# Patient Record
Sex: Female | Born: 1945
Health system: Southern US, Community
[De-identification: ages and names within clinical notes are randomized; demographics above are authoritative.]

## PROBLEM LIST (undated history)

## (undated) DIAGNOSIS — J45909 Unspecified asthma, uncomplicated: Secondary | ICD-10-CM

## (undated) DIAGNOSIS — Z9289 Personal history of other medical treatment: Secondary | ICD-10-CM

## (undated) DIAGNOSIS — K5792 Diverticulitis of intestine, part unspecified, without perforation or abscess without bleeding: Secondary | ICD-10-CM

## (undated) DIAGNOSIS — K219 Gastro-esophageal reflux disease without esophagitis: Secondary | ICD-10-CM

## (undated) DIAGNOSIS — E785 Hyperlipidemia, unspecified: Secondary | ICD-10-CM

## (undated) DIAGNOSIS — K760 Fatty (change of) liver, not elsewhere classified: Secondary | ICD-10-CM

## (undated) DIAGNOSIS — I10 Essential (primary) hypertension: Secondary | ICD-10-CM

## (undated) DIAGNOSIS — Z8542 Personal history of malignant neoplasm of other parts of uterus: Secondary | ICD-10-CM

## (undated) DIAGNOSIS — G8929 Other chronic pain: Secondary | ICD-10-CM

## (undated) DIAGNOSIS — K529 Noninfective gastroenteritis and colitis, unspecified: Secondary | ICD-10-CM

## (undated) DIAGNOSIS — M1990 Unspecified osteoarthritis, unspecified site: Secondary | ICD-10-CM

## (undated) DIAGNOSIS — Z5189 Encounter for other specified aftercare: Secondary | ICD-10-CM

## (undated) DIAGNOSIS — D649 Anemia, unspecified: Secondary | ICD-10-CM

## (undated) DIAGNOSIS — T7840XA Allergy, unspecified, initial encounter: Secondary | ICD-10-CM

## (undated) DIAGNOSIS — E278 Other specified disorders of adrenal gland: Secondary | ICD-10-CM

## (undated) DIAGNOSIS — R200 Anesthesia of skin: Secondary | ICD-10-CM

## (undated) DIAGNOSIS — K5790 Diverticulosis of intestine, part unspecified, without perforation or abscess without bleeding: Secondary | ICD-10-CM

## (undated) DIAGNOSIS — N8110 Cystocele, unspecified: Secondary | ICD-10-CM

## (undated) DIAGNOSIS — G709 Myoneural disorder, unspecified: Secondary | ICD-10-CM

## (undated) DIAGNOSIS — R202 Paresthesia of skin: Secondary | ICD-10-CM

## (undated) DIAGNOSIS — M549 Dorsalgia, unspecified: Secondary | ICD-10-CM

## (undated) DIAGNOSIS — E119 Type 2 diabetes mellitus without complications: Secondary | ICD-10-CM

## (undated) DIAGNOSIS — F32A Depression, unspecified: Secondary | ICD-10-CM

## (undated) DIAGNOSIS — E559 Vitamin D deficiency, unspecified: Secondary | ICD-10-CM

## (undated) DIAGNOSIS — I00 Rheumatic fever without heart involvement: Secondary | ICD-10-CM

## (undated) DIAGNOSIS — H5440 Blindness, one eye, unspecified eye: Secondary | ICD-10-CM

## (undated) DIAGNOSIS — F329 Major depressive disorder, single episode, unspecified: Secondary | ICD-10-CM

## (undated) DIAGNOSIS — K649 Unspecified hemorrhoids: Secondary | ICD-10-CM

## (undated) DIAGNOSIS — Z8719 Personal history of other diseases of the digestive system: Secondary | ICD-10-CM

## (undated) DIAGNOSIS — Z87442 Personal history of urinary calculi: Secondary | ICD-10-CM

## (undated) HISTORY — PX: OTHER SURGICAL HISTORY: SHX169

## (undated) HISTORY — PX: EYE SURGERY: SHX253

## (undated) HISTORY — DX: Hyperlipidemia, unspecified: E78.5

## (undated) HISTORY — DX: Allergy, unspecified, initial encounter: T78.40XA

## (undated) HISTORY — DX: Diverticulosis of intestine, part unspecified, without perforation or abscess without bleeding: K57.90

## (undated) HISTORY — PX: BACK SURGERY: SHX140

## (undated) HISTORY — DX: Gastro-esophageal reflux disease without esophagitis: K21.9

## (undated) HISTORY — DX: Essential (primary) hypertension: I10

## (undated) HISTORY — DX: Unspecified osteoarthritis, unspecified site: M19.90

## (undated) HISTORY — DX: Noninfective gastroenteritis and colitis, unspecified: K52.9

## (undated) HISTORY — DX: Cystocele, unspecified: N81.10

## (undated) HISTORY — DX: Other specified disorders of adrenal gland: E27.8

## (undated) HISTORY — DX: Personal history of malignant neoplasm of other parts of uterus: Z85.42

## (undated) HISTORY — DX: Depression, unspecified: F32.A

## (undated) HISTORY — DX: Other chronic pain: G89.29

## (undated) HISTORY — DX: Dorsalgia, unspecified: M54.9

## (undated) HISTORY — DX: Major depressive disorder, single episode, unspecified: F32.9

## (undated) HISTORY — PX: CHOLECYSTECTOMY: SHX55

## (undated) HISTORY — DX: Personal history of other medical treatment: Z92.89

---
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
## 1968-10-19 DIAGNOSIS — Z5189 Encounter for other specified aftercare: Secondary | ICD-10-CM

## 1968-10-19 DIAGNOSIS — IMO0001 Reserved for inherently not codable concepts without codable children: Secondary | ICD-10-CM

## 1968-10-19 HISTORY — DX: Encounter for other specified aftercare: Z51.89

## 1968-10-19 HISTORY — DX: Reserved for inherently not codable concepts without codable children: IMO0001

## 1977-10-19 HISTORY — PX: KIDNEY STONE SURGERY: SHX686

## 1978-10-19 HISTORY — PX: DILATION AND CURETTAGE OF UTERUS: SHX78

## 1978-10-19 HISTORY — PX: ABDOMINAL HYSTERECTOMY: SHX81

## 1994-10-19 HISTORY — PX: TRABECULECTOMY: SHX107

## 1997-08-19 HISTORY — PX: BREAST BIOPSY: SHX20

## 1999-04-19 HISTORY — PX: LIVER BIOPSY: SHX301

## 2006-11-19 HISTORY — PX: CARDIAC CATHETERIZATION: SHX172

## 2008-10-19 LAB — HM COLONOSCOPY

## 2008-12-17 LAB — HM PAP SMEAR: HM Pap smear: NORMAL

## 2010-02-10 ENCOUNTER — Ambulatory Visit (HOSPITAL_COMMUNITY): Admission: RE | Admit: 2010-02-10 | Discharge: 2010-02-10 | Payer: Self-pay | Admitting: General Surgery

## 2010-02-14 ENCOUNTER — Ambulatory Visit (HOSPITAL_COMMUNITY): Admission: RE | Admit: 2010-02-14 | Discharge: 2010-02-14 | Payer: Self-pay | Admitting: General Surgery

## 2010-06-19 LAB — HM MAMMOGRAPHY: HM Mammogram: NORMAL

## 2011-02-23 ENCOUNTER — Encounter: Payer: Self-pay | Admitting: Medical

## 2011-02-23 ENCOUNTER — Ambulatory Visit (INDEPENDENT_AMBULATORY_CARE_PROVIDER_SITE_OTHER): Payer: Medicaid Other | Admitting: Medical

## 2011-02-23 VITALS — BP 130/80 | HR 60 | Resp 12 | Ht 66.0 in | Wt 217.0 lb

## 2011-02-23 DIAGNOSIS — R1310 Dysphagia, unspecified: Secondary | ICD-10-CM

## 2011-02-23 DIAGNOSIS — M26609 Unspecified temporomandibular joint disorder, unspecified side: Secondary | ICD-10-CM

## 2011-02-23 DIAGNOSIS — I10 Essential (primary) hypertension: Secondary | ICD-10-CM | POA: Insufficient documentation

## 2011-02-23 DIAGNOSIS — E785 Hyperlipidemia, unspecified: Secondary | ICD-10-CM | POA: Insufficient documentation

## 2011-02-23 DIAGNOSIS — E119 Type 2 diabetes mellitus without complications: Secondary | ICD-10-CM

## 2011-02-23 LAB — HEPATIC FUNCTION PANEL
ALT: 45 U/L — AB (ref 7–35)
AST: 33 U/L (ref 13–35)
Alkaline Phosphatase: 77 U/L (ref 25–125)
Bilirubin, Direct: 0.1 mg/dL
Bilirubin, Total: 0.3 mg/dL

## 2011-02-23 LAB — LIPID PANEL
Cholesterol: 181 mg/dL (ref 0–200)
HDL: 62 mg/dL (ref 35–70)
LDL Cholesterol: 100 mg/dL
LDl/HDL Ratio: 2.9
Triglycerides: 95 mg/dL (ref 40–160)

## 2011-02-23 LAB — HEMOGLOBIN A1C: Hgb A1c MFr Bld: 7.3 % — AB (ref 4.0–6.0)

## 2011-02-23 MED ORDER — HYDROCHLOROTHIAZIDE 25 MG PO TABS: 25.0000 mg | ORAL_TABLET | Freq: Every day | ORAL | Status: DC

## 2011-02-23 MED ORDER — ENALAPRIL MALEATE 20 MG PO TABS: 20.0000 mg | ORAL_TABLET | Freq: Every day | ORAL | Status: DC

## 2011-02-23 MED ORDER — TIZANIDINE HCL 4 MG PO TABS: ORAL_TABLET | ORAL | Status: DC

## 2011-02-23 MED ORDER — PRAVASTATIN SODIUM 20 MG PO TABS: 20.0000 mg | ORAL_TABLET | Freq: Every day | ORAL | Status: DC

## 2011-02-23 MED ORDER — DEXLANSOPRAZOLE 60 MG PO CPDR: 60.0000 mg | DELAYED_RELEASE_CAPSULE | Freq: Every day | ORAL | Status: DC

## 2011-02-23 MED ORDER — DULOXETINE HCL 60 MG PO CPEP: 60.0000 mg | ORAL_CAPSULE | Freq: Two times a day (BID) | ORAL | Status: DC

## 2011-02-23 NOTE — Progress Notes (Signed)
Subjective:    Patient ID: Robyn Hill, female    DOB: 10/31/1945, 65 y.o.   MRN: [id number]  HPI Comments: Here as a new patient to establish care.  She was formerly seeing me as a patient at [facility] [place].   1) Last visit was 02/03/2011 for swallowing and neck issues.  At that time she ended up having thyroid nodules on neck ultrasound, was seen by Dr. Rosamaria Lints, but was advised that the difficulty swallowing could be from a combination of etiologies.  Dr. Andrey Campanile apparently sent her for xray of neck and held off on biopsy at that time.  He will see her back in 3wk. He had her double up on Dexilant and had her take her Cymbalta BID since she was having problems swallowing this.  She notes note improvement in swallowing, still has worse issue if turning her head to the right.  2) she notes recently having right facial pain at the joint of the jaw.  Using nothing for this.  ENT/Dr. Andrey Campanile advised she use Ibuprofen but she avoids NSAIDs due to hx/o GERD. 3) needs med refills today. 4) here for f/u for labs on diabetes, cholesterol.   Doing ok in this regard. Trying to eat healthy, compliant with medications.   Hyperlipidemia Pertinent negatives include no chest pain or shortness of breath.  Hypertension Associated symptoms include neck pain. Pertinent negatives include no chest pain, palpitations or shortness of breath.  Diabetes Pertinent negatives for diabetes include no chest pain, no fatigue and no weakness.      Review of Systems  Constitutional: Negative for fever, chills, fatigue and unexpected weight change.  HENT: Positive for trouble swallowing and neck pain. Negative for ear pain, congestion, sore throat, facial swelling, rhinorrhea, sneezing and neck stiffness.   Respiratory: Negative for shortness of breath.   Cardiovascular: Negative for chest pain, palpitations and leg swelling.  Gastrointestinal: Negative for nausea, vomiting, abdominal pain, diarrhea, constipation  and abdominal distention.  Genitourinary: Negative for difficulty urinating.  Musculoskeletal: Negative for joint swelling.  Skin: Negative for color change and rash.  Neurological: Negative for weakness and numbness.  Hematological: Does not bruise/bleed easily.       Objective:   Physical Exam  Vitals reviewed. Constitutional: She appears well-developed and well-nourished.  HENT:  Head: Normocephalic.  Right Ear: External ear normal.  Left Ear: External ear normal.  Nose: Nose normal.  Mouth/Throat: Oropharynx is clear and moist.  Neck: Normal range of motion. Neck supple. No JVD present. No tracheal deviation present. Thyromegaly present.       Mild fullness of left neck, approx 1 cm left thyroid nodule on palpation, mildly tender left side of neck, otherwise supple, no other masses noted  Cardiovascular: Normal rate, regular rhythm, normal heart sounds and intact distal pulses.   No murmur heard. Pulmonary/Chest: Effort normal and breath sounds normal. She has no wheezes. She has no rales.  Abdominal: Soft. Bowel sounds are normal. She exhibits no distension and no mass. There is no tenderness. There is no rebound and no guarding.  Musculoskeletal: She exhibits no edema and no tenderness.  Lymphadenopathy:    She has no cervical adenopathy.  Skin: Skin is warm and dry.          Assessment & Plan:   Encounter Diagnoses  Name Primary?  . Hyperlipidemia Yes  . Diabetes type 2, controlled   . TMJ dysfunction   . Odynophagia   . Hypertension    Hyperlipidemia - c/t same  meds.  Labs today.   She does have hx/o elevated LFTs. DM type 2 - she has been controlled with diet and current meds.  Labs today. TMJ dysfunctions - trial of Tizanidine, ice, and she will discuss this with ENT. Odynophagia - recheck with Dr. Andrey Campanile as scheduled.  Will likely need additional imaging such as swallow study or MRI. HTN - controlled on current medication.

## 2011-02-24 ENCOUNTER — Telehealth: Payer: Self-pay | Admitting: Medical

## 2011-02-24 ENCOUNTER — Encounter: Payer: Self-pay | Admitting: Medical

## 2011-02-24 ENCOUNTER — Other Ambulatory Visit: Payer: Self-pay | Admitting: Medical

## 2011-02-24 MED ORDER — TIZANIDINE HCL 4 MG PO TABS: ORAL_TABLET | ORAL | Status: DC

## 2011-02-24 MED ORDER — HYDROCHLOROTHIAZIDE 25 MG PO TABS: 25.0000 mg | ORAL_TABLET | Freq: Every day | ORAL | Status: DC

## 2011-02-24 MED ORDER — SITAGLIPTIN PHOS-METFORMIN HCL 50-1000 MG PO TABS: 1.0000 | ORAL_TABLET | Freq: Two times a day (BID) | ORAL | Status: DC

## 2011-02-24 MED ORDER — PRAVASTATIN SODIUM 20 MG PO TABS: 20.0000 mg | ORAL_TABLET | Freq: Every day | ORAL | Status: DC

## 2011-02-24 MED ORDER — ENALAPRIL MALEATE 20 MG PO TABS: 20.0000 mg | ORAL_TABLET | Freq: Every day | ORAL | Status: DC

## 2011-02-24 MED ORDER — DEXLANSOPRAZOLE 60 MG PO CPDR: 60.0000 mg | DELAYED_RELEASE_CAPSULE | Freq: Two times a day (BID) | ORAL | Status: DC

## 2011-02-24 NOTE — Telephone Encounter (Addendum)
Message copied by Ellsworth Lennox on Tue Feb 24, 2011  3:05 PM ------      Message from: [place], DAVID      Created: Tue Feb 24, 2011  1:31 PM       1) liver tests are stable, cholesterol ok, but diabetes marker is worse at 7.3%.        2) she is already on Metformin 1000mg  BID.  At this point, I'd like her to start checking morning glucose fasting and keep record of this.  Verify that she has meter and strips.  If not, we can send script to pharmacy.        3) ask her to look at her diet and make sure she is getting some exercise, but also eating low carb/low fat diet.  Avoid sweets, cola, sweet tea, heavy bread portions.  Instead, eat more fruits, vegetables, drink more water, and eat smaller portions in general.      4) i want changer her to a medication to help get her blood sugar under a little better control.  We will stop Metformin and start Janumet which has Metformin in it.  I'll send script.      5) recheck 97mo  Pt notified of lab results.  Will start Janumet. Pt aware to start taking her blood sugar every morning and will keep tract of it.  Scheduled an appointment for a 3 month follow up.  CM, LPN

## 2011-03-03 ENCOUNTER — Other Ambulatory Visit (HOSPITAL_COMMUNITY): Payer: Self-pay | Admitting: General Surgery

## 2011-03-03 ENCOUNTER — Encounter: Payer: Self-pay | Admitting: Medical

## 2011-03-03 ENCOUNTER — Other Ambulatory Visit: Payer: Self-pay | Admitting: General Surgery

## 2011-03-03 DIAGNOSIS — E278 Other specified disorders of adrenal gland: Secondary | ICD-10-CM

## 2011-03-06 ENCOUNTER — Inpatient Hospital Stay
Admission: RE | Admit: 2011-03-06 | Discharge: 2011-03-06 | Payer: Medicaid Other | Source: Ambulatory Visit | Attending: General Surgery | Admitting: General Surgery

## 2011-03-06 ENCOUNTER — Ambulatory Visit
Admission: RE | Admit: 2011-03-06 | Discharge: 2011-03-06 | Disposition: A | Discharge status: Home / Self Care | Payer: Medicaid Other | Source: Ambulatory Visit | Attending: General Surgery | Admitting: General Surgery

## 2011-03-06 DIAGNOSIS — E278 Other specified disorders of adrenal gland: Secondary | ICD-10-CM

## 2011-03-06 MED ORDER — IOHEXOL 300 MG/ML  SOLN: 100.0000 mL | Freq: Once | INTRAMUSCULAR | Status: AC | PRN

## 2011-04-30 ENCOUNTER — Telehealth: Payer: Self-pay | Admitting: Medical

## 2011-04-30 NOTE — Telephone Encounter (Signed)
Please call needs rx for lancets and strips for her accucheck compact plus meter Wants written rx mailed to her

## 2011-04-30 NOTE — Telephone Encounter (Signed)
Rx for 90 day supply, #90 ready to mail.  Since she is NOT on insulin, insurance usually only covers once daily tesing.

## 2011-05-01 NOTE — Telephone Encounter (Signed)
CALLED PT ADVISED THAT SCRIPT FOR METER, LANCET & STRIPS IS COMPLETE. VERIFIED THAT SHE WANT IT MAILED TO HOME ADDRESS. ADVISED THAT DEXILANT & CYMBALTA HAVE NOT BEEN HANDLED. ADVISED THAT SHANE IS OUT OF OFFICE TODAY. ASKED IF SCRIPTS FOR OTHER MEDS NEED TO BE HANDLED TODAY SO THAT SHE HAS MEDS OVER WEEKEND. SHE SD THAT SHE WILL WAIT UNTIL MON FOR THOSE BECAUSE SHE HAS MEDS ANYWAY.

## 2011-05-04 ENCOUNTER — Other Ambulatory Visit: Payer: Self-pay | Admitting: Medical

## 2011-05-04 MED ORDER — DEXLANSOPRAZOLE 60 MG PO CPDR: 60.0000 mg | DELAYED_RELEASE_CAPSULE | Freq: Two times a day (BID) | ORAL | Status: DC

## 2011-05-04 MED ORDER — DULOXETINE HCL 60 MG PO CPEP: 60.0000 mg | ORAL_CAPSULE | Freq: Two times a day (BID) | ORAL | Status: DC

## 2011-05-05 ENCOUNTER — Other Ambulatory Visit: Payer: Self-pay | Admitting: Medical

## 2011-05-05 NOTE — Telephone Encounter (Signed)
Mailed other 2 scripts to pt today.

## 2011-05-05 NOTE — Telephone Encounter (Signed)
Needs your approval.

## 2011-05-08 ENCOUNTER — Telehealth: Payer: Self-pay | Admitting: Medical

## 2011-05-08 NOTE — Telephone Encounter (Signed)
rx called in

## 2011-05-08 NOTE — Telephone Encounter (Signed)
Is this ok?

## 2011-05-27 ENCOUNTER — Ambulatory Visit (INDEPENDENT_AMBULATORY_CARE_PROVIDER_SITE_OTHER): Payer: Medicare Other | Admitting: Medical

## 2011-05-27 ENCOUNTER — Ambulatory Visit: Payer: Medicaid Other | Admitting: Medical

## 2011-05-27 ENCOUNTER — Encounter: Payer: Self-pay | Admitting: Medical

## 2011-05-27 DIAGNOSIS — R29890 Loss of height: Secondary | ICD-10-CM

## 2011-05-27 DIAGNOSIS — I10 Essential (primary) hypertension: Secondary | ICD-10-CM

## 2011-05-27 DIAGNOSIS — M5412 Radiculopathy, cervical region: Secondary | ICD-10-CM

## 2011-05-27 DIAGNOSIS — M949 Disorder of cartilage, unspecified: Secondary | ICD-10-CM

## 2011-05-27 DIAGNOSIS — E119 Type 2 diabetes mellitus without complications: Secondary | ICD-10-CM

## 2011-05-27 DIAGNOSIS — Z79899 Other long term (current) drug therapy: Secondary | ICD-10-CM

## 2011-05-27 DIAGNOSIS — M542 Cervicalgia: Secondary | ICD-10-CM

## 2011-05-27 DIAGNOSIS — M899 Disorder of bone, unspecified: Secondary | ICD-10-CM

## 2011-05-27 DIAGNOSIS — M8580 Other specified disorders of bone density and structure, unspecified site: Secondary | ICD-10-CM

## 2011-05-27 LAB — COMPREHENSIVE METABOLIC PANEL
ALT: 70 U/L — ABNORMAL HIGH (ref 0–35)
AST: 59 U/L — ABNORMAL HIGH (ref 0–37)
Albumin: 4.4 g/dL (ref 3.5–5.2)
Alkaline Phosphatase: 67 U/L (ref 39–117)
BUN: 23 mg/dL (ref 6–23)
CO2: 30 mEq/L (ref 19–32)
Calcium: 10 mg/dL (ref 8.4–10.5)
Chloride: 101 mEq/L (ref 96–112)
Creat: 0.61 mg/dL (ref 0.50–1.10)
Glucose, Bld: 97 mg/dL (ref 70–99)
Potassium: 4.4 mEq/L (ref 3.5–5.3)
Sodium: 140 mEq/L (ref 135–145)
Total Bilirubin: 0.5 mg/dL (ref 0.3–1.2)
Total Protein: 7 g/dL (ref 6.0–8.3)

## 2011-05-27 LAB — POCT URINALYSIS DIPSTICK
Bilirubin, UA: NEGATIVE
Blood, UA: NEGATIVE
Glucose, UA: NEGATIVE
Ketones, UA: NEGATIVE
Leukocytes, UA: NEGATIVE
Nitrite, UA: NEGATIVE
Protein, UA: NEGATIVE
Spec Grav, UA: 1.01
Urobilinogen, UA: NEGATIVE
pH, UA: 7

## 2011-05-27 LAB — POCT GLYCOSYLATED HEMOGLOBIN (HGB A1C): Hemoglobin A1C: 6.6

## 2011-05-27 NOTE — Progress Notes (Signed)
Subjective:   HPI  Robyn Hill is a 65 y.o. female who presents for f/u on chronic issues.  I last saw her in May.  She is being followed by Dr. Orvan Falconer dermatology for skin issues.  At her last visit we stopped her metformin and started her on Janumet has her hemoglobin A1c was worse.  She has been checking her sugars regularly, and morning fasting glucoses are usually less than 130. The most it has been was 136.  She just saw her eye doctor last month and was taken off one of her eyedrops for glaucoma.  She has seen her dentist within the past 6 months for hygiene visit.  She is due for mammogram in September.  She saw Dr. Derrell Lolling, general surgeon, in the summertime for follow up of the adrenal lesion, and had a repeat CT abdomen with no changes in the adrenal mass.  Of note she is status post hysterectomy.  Her last colonoscopy was 2010, EGD and swallow study July 2010.  Her last bone density was several years ago. She does note some height loss over time, and her bones appeared osteopenic on recent C-spine x-ray.    She continues to have neck issues. She saw Dr. Andrey Campanile, ENT, within the past few months for difficulty swallowing, new diagnosis of multinodular goiter, and had biopsy. She also had a C-spine x-ray which showed degenerative changes. She has not had an MRI of the neck.  Her main complaint today is ongoing neck and arm problems. She has been getting intermittent pains that start in her neck and radiate down both arms, she normally gets some tingling and numbness in the arms well. This happens intermittently. Nothing aggravates or improves the pain or numbness.  She says that ENT suggested that some of her neck symptoms are related to arthritis in the C-spine which is likely causing her swallow issues as well.  She continued to have TMJ syndrome, and there has been some consideration of steroid injection into the right TMJ.  She is not using tizanidine at the time.  She has a history of  chronic pain, chronic back pain, and was using tizanidine and narcotic pain medication; however, since she's been on Cymbalta this has really helped the pain. She currently is not using any narcotic pain medication. Her last lumbar MRI was 08/2007. No other aggravating or relieving factors.  No other c/o.  The following portions of the patient's history were reviewed and updated as appropriate: allergies, current medications, past family history, past medical history, past social history, past surgical history and problem list.  Past Medical History  Diagnosis Date  . Hypertension   . Diabetes mellitus   . Hyperlipidemia   . Glaucoma   . Urinary incontinence   . Dysphagia   . Chronic back pain   . Osteoarthritis   . Edema   . Female bladder prolapse   . Allergy   . Migraine   . GERD (gastroesophageal reflux disease)   . Diverticulosis   . Chronic diarrhea   . History of uterine cancer   . History of anemia   . Vertigo   . Depression   . Anxiety   . Adrenal mass, right     Review of Systems Constitutional: denies fever, chills, sweats, unexpected weight change, anorexia, fatigue Cardiology: denies chest pain, palpitations, edema Respiratory: denies cough, shortness of breath, wheezing Gastroenterology: denies abdominal pain, nausea, vomiting, diarrhea, constipation Musculoskeletal: +neck pain, bilat radiating arm pain, low back pain, no other  arthralgias, myalgias, joint swelling, back pain Ophthalmology: denies vision changes Urology: denies dysuria, difficulty urinating, hematuria, urinary frequency, urgency Neurology: +numbness intermittent in bilat arms/hand and back of neck; no headache     Objective:   Physical Exam  General appearance: alert, no distress, WD/WN, white female Neck: mild posterior tenderness, otherwise supple, no lymphadenopathy, no thyromegaly, no masses Heart: RRR, normal S1, S2, no murmurs Lungs: CTA bilaterally, no wheezes, rhonchi, or  rales Abdomen: +bs, soft, non tender, non distended, no masses, no hepatomegaly, no splenomegaly Back: non tender Musculoskeletal: UE normal ROM, nontender, no swelling, no obvious deformity Extremities: no edema, no cyanosis, no clubbing Pulses: 2+ symmetric, upper and lower extremities, normal cap refill Neurological: left grip seems a little decreased, otherwise UE with normal strength and sensation, DTRs normal   Assessment :    Encounter Diagnoses  Name Primary?  . Diabetes mellitus Yes  . Encounter for long-term (current) use of other medications   . Cervical radiculopathy   . Neck pain   . Essential hypertension, benign   . Loss of height   . Osteopenia      Plan:  Diabetes type II-we started her on Janumet last visit, after HgbA1C was worse.  She is doing well on this, fasting glucose in the mornings typically under 130.  C/t glucose checks, healthy diet, exercise, and labs today.    Surveillance labs in general today for chronic issues/medication use.   Cervical radiculopathy - she has struggled with swallowing issues, neck pain, and worsening radicular symptoms.  She has been seeing Dr. Rosamaria Lints for goiter and swallow issues.  At this point she likely has bulging disc or arthritis pressing on a nerve.  Gave options including watch and wait, PT, or MRI to further eval.  She wants to wait for now.    HTN - controlled on current meds.  Osteopenia on recent C spine xray.  We will get bone density and Vit D level.

## 2011-05-28 LAB — CBC WITH DIFFERENTIAL/PLATELET
Basophils Absolute: 0.1 10*3/uL (ref 0.0–0.1)
Basophils Relative: 0 % (ref 0–1)
Eosinophils Absolute: 0.5 10*3/uL (ref 0.0–0.7)
Eosinophils Relative: 4 % (ref 0–5)
HCT: 41.6 % (ref 36.0–46.0)
Hemoglobin: 13.3 g/dL (ref 12.0–15.0)
Lymphocytes Relative: 31 % (ref 12–46)
Lymphs Abs: 3.6 10*3/uL (ref 0.7–4.0)
MCH: 26.8 pg (ref 26.0–34.0)
MCHC: 32 g/dL (ref 30.0–36.0)
MCV: 83.7 fL (ref 78.0–100.0)
Monocytes Absolute: 1.1 10*3/uL — ABNORMAL HIGH (ref 0.1–1.0)
Monocytes Relative: 9 % (ref 3–12)
Neutro Abs: 6.8 10*3/uL (ref 1.7–7.7)
Neutrophils Relative %: 56 % (ref 43–77)
Platelets: 320 10*3/uL (ref 150–400)
RBC: 4.97 MIL/uL (ref 3.87–5.11)
RDW: 13.2 % (ref 11.5–15.5)
WBC: 12 10*3/uL — ABNORMAL HIGH (ref 4.0–10.5)

## 2011-05-28 LAB — MICROALBUMIN / CREATININE URINE RATIO
Creatinine, Urine: 105.5 mg/dL
Microalb Creat Ratio: 4.7 mg/g (ref 0.0–30.0)
Microalb, Ur: 0.5 mg/dL (ref 0.00–1.89)

## 2011-05-28 LAB — VITAMIN D 25 HYDROXY (VIT D DEFICIENCY, FRACTURES): Vit D, 25-Hydroxy: 22 ng/mL — ABNORMAL LOW (ref 30–89)

## 2011-05-29 ENCOUNTER — Telehealth: Payer: Self-pay | Admitting: *Deleted

## 2011-05-29 NOTE — Telephone Encounter (Addendum)
Message copied by Dorthula Perfect on Fri May 29, 2011  1:27 PM ------      Message from: Jac Canavan      Created: Fri May 29, 2011  7:54 AM       Ask her to remind me - what all has she been on for cholesterol in the past?  She is currently on Colestipol and Pravachol.   Has she been on other statins such as Crestor or Lipitor?  Did she have any problems with these?            Labs show vit D low, liver tests up a little, which they have fluctuated back and forth, but rest of labs ok (CMET, CBC, micral, UA).  I want her to take Vit D 1000 IU daily and at least 1200 mg calcium daily.  Depending upon her reply to questions above, I'm thinking about taking her off Colestipol and just using a statin by itself for cholesterol and to see if liver labs decrease.  We will call with bone density results.  Notified pt of lab results.  Pt states that she has been on cholesterol medication before but can not remember the name of it and had a terrible bout of diarrhea and Laurette Schimke stated to pt that the fat was not being broken down and that is why they put her on Colestipol.   Also states that she has never been on any other statins like Crestor or Lipitor.   Pt agreed to take Vitamin d and calcium but wanted to know if she should start these vitamins before her bone density test next Wednesday 06-04-11.  CM, LPN

## 2011-06-03 NOTE — Telephone Encounter (Signed)
For the next 3 mo, why don't we try a different statin without Colestipol, to help lower the cholesterol and see if this has any change or improvement on her liver labs?  If agreeable, I'll send script for 20mo.

## 2011-06-04 NOTE — Telephone Encounter (Signed)
Pt agrees to take statin medication for cholesterol.  CM, LPN

## 2011-06-05 ENCOUNTER — Other Ambulatory Visit: Payer: Self-pay | Admitting: Medical

## 2011-06-05 MED ORDER — PRAVASTATIN SODIUM 40 MG PO TABS: 40.0000 mg | ORAL_TABLET | Freq: Every evening | ORAL | Status: DC

## 2011-06-05 NOTE — Telephone Encounter (Signed)
Pt was notified and agrees.  Pt scheduled an appointment for 3 months to recheck liver and cholesterol levels. CM, LPN

## 2011-06-05 NOTE — Telephone Encounter (Signed)
i sent Pravastatin 40mg .  I want her to switch to this higher dose of what she is already taking.  Stop the Colestipol for now, lets recheck liver and cholesterol in 61mo and see if chol improves and liver tests improve off the Colestipol.

## 2011-06-08 ENCOUNTER — Other Ambulatory Visit: Payer: Self-pay | Admitting: Medical

## 2011-06-16 ENCOUNTER — Telehealth: Payer: Self-pay | Admitting: *Deleted

## 2011-06-16 NOTE — Telephone Encounter (Addendum)
Message copied by Dorthula Perfect on Tue Jun 16, 2011 11:21 AM ------      Message from: Aleen Campi, DAVID S      Created: Mon Jun 15, 2011  5:44 PM       Her bone density test was actually normal.  I"m glad we can report some good news to her!  Pt notified of bone density test.  CM, LPN

## 2011-06-17 NOTE — Telephone Encounter (Signed)
I will [facility] tomorrow to inquire about the best study to check her neck.  Then I'll order MRI vs CT.

## 2011-06-19 ENCOUNTER — Other Ambulatory Visit: Payer: Self-pay | Admitting: Medical

## 2011-06-19 DIAGNOSIS — M5412 Radiculopathy, cervical region: Secondary | ICD-10-CM

## 2011-06-19 DIAGNOSIS — M542 Cervicalgia: Secondary | ICD-10-CM

## 2011-06-19 DIAGNOSIS — R29898 Other symptoms and signs involving the musculoskeletal system: Secondary | ICD-10-CM

## 2011-06-19 NOTE — Telephone Encounter (Signed)
Lets set up MRI without contrast of C-spine.   Dx: cervical radiculopathy, arm weakness and numbness, neck pain, hx/o spondylolysis on C spine xray

## 2011-06-19 NOTE — Telephone Encounter (Signed)
Pt notified that MRI of C Spine without contrast is scheduled for 06-24-11 at 9 am at [facility].  CM,LPN

## 2011-06-24 ENCOUNTER — Ambulatory Visit
Admission: RE | Admit: 2011-06-24 | Discharge: 2011-06-24 | Disposition: A | Discharge status: Home / Self Care | Payer: Medicare Other | Source: Ambulatory Visit | Attending: Medical | Admitting: Medical

## 2011-06-24 DIAGNOSIS — R29898 Other symptoms and signs involving the musculoskeletal system: Secondary | ICD-10-CM

## 2011-06-24 DIAGNOSIS — M542 Cervicalgia: Secondary | ICD-10-CM

## 2011-06-24 DIAGNOSIS — M5412 Radiculopathy, cervical region: Secondary | ICD-10-CM

## 2011-06-26 ENCOUNTER — Telehealth: Payer: Self-pay | Admitting: *Deleted

## 2011-06-26 NOTE — Telephone Encounter (Addendum)
Message copied by Dorthula Perfect on Fri Jun 26, 2011  4:58 PM ------      Message from: Aleen Campi, DAVID S      Created: Fri Jun 26, 2011  8:04 AM       There are discs bulging and arthritis causing some narrowing of the openings where nerve roots exit the spine.  Both issues are likely contributing to her symptoms.  At this point, lets have her consult with the same neurosurgery her husband saw.  Get that info from her and make referral.  I think it was [facility], but I can't recall which doctor.   Pt notified of xray result.  Pt was referred to Dr. Venetia Maxon at [facility].  They will call pt to set up appointment with her.  Pt is aware. CM,LPN

## 2011-07-03 ENCOUNTER — Telehealth: Payer: Self-pay | Admitting: Medical

## 2011-07-03 NOTE — Telephone Encounter (Signed)
Spoke with pt and everything was faxed with referral for pt to [facility] on 06-26-11.  CM,LPN

## 2011-07-22 ENCOUNTER — Other Ambulatory Visit (INDEPENDENT_AMBULATORY_CARE_PROVIDER_SITE_OTHER): Payer: Medicare Other

## 2011-07-22 DIAGNOSIS — Z23 Encounter for immunization: Secondary | ICD-10-CM

## 2011-07-24 LAB — HM MAMMOGRAPHY: HM Mammogram: NEGATIVE

## 2011-07-31 ENCOUNTER — Other Ambulatory Visit: Payer: Self-pay | Admitting: Neurosurgery

## 2011-07-31 DIAGNOSIS — M48061 Spinal stenosis, lumbar region without neurogenic claudication: Secondary | ICD-10-CM

## 2011-07-31 DIAGNOSIS — M546 Pain in thoracic spine: Secondary | ICD-10-CM

## 2011-08-07 ENCOUNTER — Ambulatory Visit
Admission: RE | Admit: 2011-08-07 | Discharge: 2011-08-07 | Disposition: A | Discharge status: Home / Self Care | Payer: Medicare Other | Source: Ambulatory Visit | Attending: Neurosurgery | Admitting: Neurosurgery

## 2011-08-07 DIAGNOSIS — M546 Pain in thoracic spine: Secondary | ICD-10-CM

## 2011-08-07 DIAGNOSIS — M48061 Spinal stenosis, lumbar region without neurogenic claudication: Secondary | ICD-10-CM

## 2011-09-03 ENCOUNTER — Other Ambulatory Visit: Payer: Medicare Other

## 2011-09-03 ENCOUNTER — Other Ambulatory Visit: Payer: Self-pay | Admitting: Medical

## 2011-09-03 DIAGNOSIS — I10 Essential (primary) hypertension: Secondary | ICD-10-CM

## 2011-09-03 DIAGNOSIS — E785 Hyperlipidemia, unspecified: Secondary | ICD-10-CM

## 2011-09-04 LAB — LIPID PANEL
Cholesterol: 206 mg/dL — ABNORMAL HIGH (ref 0–200)
HDL: 63 mg/dL (ref >39)
LDL Cholesterol: 112 mg/dL — ABNORMAL HIGH (ref 0–99)
Total CHOL/HDL Ratio: 3.3 Ratio
Triglycerides: 153 mg/dL — ABNORMAL HIGH (ref <150)
VLDL: 31 mg/dL (ref 0–40)

## 2011-09-04 LAB — ALT: ALT: 64 U/L — ABNORMAL HIGH (ref 0–35)

## 2011-09-11 ENCOUNTER — Other Ambulatory Visit: Payer: Self-pay | Admitting: Medical

## 2011-09-11 MED ORDER — ATORVASTATIN CALCIUM 40 MG PO TABS: 40.0000 mg | ORAL_TABLET | Freq: Every day | ORAL | Status: DC

## 2011-09-16 ENCOUNTER — Other Ambulatory Visit: Payer: Self-pay | Admitting: Medical

## 2011-09-17 ENCOUNTER — Telehealth: Payer: Self-pay | Admitting: Family Medicine

## 2011-09-18 ENCOUNTER — Other Ambulatory Visit: Payer: Self-pay | Admitting: Medical

## 2011-09-18 NOTE — Telephone Encounter (Signed)
Pls pull chart and send this back to me

## 2011-09-21 ENCOUNTER — Telehealth: Payer: Self-pay | Admitting: Medical

## 2011-09-21 NOTE — Telephone Encounter (Signed)
I called pt and spoke about the recent lipid changes . She will go back on Colestipol for both cholesterol but also to help break down fats.  Prior GI put her on this to break down fat as she was having lots of diarrhea.   Since we took her off this the diarrhea has returned.  She will c/t Lipitor as well.

## 2011-09-21 NOTE — Telephone Encounter (Signed)
I CALLED AND SPOKE TO THE PHARMACY ABOUT THE PATIENTS MEDICATION AND THEY STATE THAT EVERYTHING HAS BEEN TAKEN CARE OF. CLS

## 2011-09-21 NOTE — Telephone Encounter (Signed)
I CALLED AND I SPOKE WITH THE PATIENT AND SHE SAID HER BOTTLE SAYS COLESTIPOL TAKE 1 GRAM TABLETS 4 TIMES A DAY SO SHE TAKES 4OO GRAMS A DAY. CLS

## 2011-09-21 NOTE — Telephone Encounter (Signed)
PATIENT HAVING TROUBLE GETTING NEW LIPITOR RX REFILLED  PER PT WE NEED TO CALL [id number]  AND EXPLAIN TO THEM WHY SHE HAS NEW RX SO THEY CAN OVER RIDE.  PLEASE CALL PT

## 2011-09-22 MED ORDER — COLESTIPOL HCL 1 G PO TABS: ORAL_TABLET | ORAL | Status: DC

## 2011-09-22 NOTE — Progress Notes (Signed)
I CALLED COLESTIPOL TO THE [facility] PER SHANE TYSINGER. CLS

## 2011-10-16 ENCOUNTER — Other Ambulatory Visit: Payer: Self-pay | Admitting: Medical

## 2011-10-19 ENCOUNTER — Other Ambulatory Visit: Payer: Self-pay | Admitting: *Deleted

## 2011-10-19 MED ORDER — HYDROCODONE-ACETAMINOPHEN 5-500 MG PO TABS: 1.0000 | ORAL_TABLET | Freq: Four times a day (QID) | ORAL | Status: DC | PRN

## 2011-10-19 NOTE — Telephone Encounter (Signed)
pls call out script, #120, 1 refill

## 2011-10-19 NOTE — Telephone Encounter (Signed)
Is this ok to fill? 

## 2011-11-02 ENCOUNTER — Other Ambulatory Visit: Payer: Self-pay | Admitting: Medical

## 2011-11-04 DIAGNOSIS — M545 Low back pain, unspecified: Secondary | ICD-10-CM | POA: Diagnosis not present

## 2011-11-09 DIAGNOSIS — M545 Low back pain, unspecified: Secondary | ICD-10-CM | POA: Diagnosis not present

## 2011-11-09 DIAGNOSIS — IMO0002 Reserved for concepts with insufficient information to code with codable children: Secondary | ICD-10-CM | POA: Diagnosis not present

## 2011-11-25 ENCOUNTER — Encounter: Payer: Self-pay | Admitting: Medical

## 2011-11-25 ENCOUNTER — Ambulatory Visit (INDEPENDENT_AMBULATORY_CARE_PROVIDER_SITE_OTHER): Payer: Medicare Other | Admitting: Medical

## 2011-11-25 ENCOUNTER — Encounter: Payer: Self-pay | Admitting: Internal Medicine

## 2011-11-25 VITALS — BP 128/80 | HR 72 | Temp 97.7°F | Resp 16 | Wt 215.0 lb

## 2011-11-25 DIAGNOSIS — Z79899 Other long term (current) drug therapy: Secondary | ICD-10-CM

## 2011-11-25 DIAGNOSIS — R252 Cramp and spasm: Secondary | ICD-10-CM

## 2011-11-25 DIAGNOSIS — D179 Benign lipomatous neoplasm, unspecified: Secondary | ICD-10-CM | POA: Insufficient documentation

## 2011-11-25 DIAGNOSIS — Z23 Encounter for immunization: Secondary | ICD-10-CM | POA: Diagnosis not present

## 2011-11-25 DIAGNOSIS — E118 Type 2 diabetes mellitus with unspecified complications: Secondary | ICD-10-CM | POA: Insufficient documentation

## 2011-11-25 DIAGNOSIS — E119 Type 2 diabetes mellitus without complications: Secondary | ICD-10-CM | POA: Diagnosis not present

## 2011-11-25 DIAGNOSIS — R748 Abnormal levels of other serum enzymes: Secondary | ICD-10-CM | POA: Insufficient documentation

## 2011-11-25 DIAGNOSIS — E785 Hyperlipidemia, unspecified: Secondary | ICD-10-CM | POA: Diagnosis not present

## 2011-11-25 DIAGNOSIS — J329 Chronic sinusitis, unspecified: Secondary | ICD-10-CM

## 2011-11-25 DIAGNOSIS — R7401 Elevation of levels of liver transaminase levels: Secondary | ICD-10-CM | POA: Diagnosis not present

## 2011-11-25 LAB — IRON AND TIBC
%SAT: 26 % (ref 20–55)
Iron: 105 ug/dL (ref 42–145)
TIBC: 404 ug/dL (ref 250–470)
UIBC: 299 ug/dL (ref 125–400)

## 2011-11-25 LAB — LIPID PANEL
Cholesterol: 170 mg/dL (ref 0–200)
HDL: 60 mg/dL (ref >39)
LDL Cholesterol: 85 mg/dL (ref 0–99)
Total CHOL/HDL Ratio: 2.8 Ratio
Triglycerides: 126 mg/dL (ref <150)
VLDL: 25 mg/dL (ref 0–40)

## 2011-11-25 LAB — CBC
HCT: 42 % (ref 36.0–46.0)
Hemoglobin: 13.1 g/dL (ref 12.0–15.0)
MCH: 26.7 pg (ref 26.0–34.0)
MCHC: 31.2 g/dL (ref 30.0–36.0)
MCV: 85.5 fL (ref 78.0–100.0)
Platelets: 344 10*3/uL (ref 150–400)
RBC: 4.91 MIL/uL (ref 3.87–5.11)
RDW: 13.8 % (ref 11.5–15.5)
WBC: 12.8 10*3/uL — ABNORMAL HIGH (ref 4.0–10.5)

## 2011-11-25 LAB — COMPREHENSIVE METABOLIC PANEL
ALT: 34 U/L (ref 0–35)
AST: 24 U/L (ref 0–37)
Albumin: 4.4 g/dL (ref 3.5–5.2)
Alkaline Phosphatase: 68 U/L (ref 39–117)
BUN: 18 mg/dL (ref 6–23)
CO2: 29 mEq/L (ref 19–32)
Calcium: 10 mg/dL (ref 8.4–10.5)
Chloride: 99 mEq/L (ref 96–112)
Creat: 0.63 mg/dL (ref 0.50–1.10)
Glucose, Bld: 109 mg/dL — ABNORMAL HIGH (ref 70–99)
Potassium: 5.1 mEq/L (ref 3.5–5.3)
Sodium: 139 mEq/L (ref 135–145)
Total Bilirubin: 0.5 mg/dL (ref 0.3–1.2)
Total Protein: 6.8 g/dL (ref 6.0–8.3)

## 2011-11-25 LAB — CK: Total CK: 27 U/L (ref 7–177)

## 2011-11-25 LAB — HEMOGLOBIN A1C
Hgb A1c MFr Bld: 6.5 % — ABNORMAL HIGH (ref <5.7)
Mean Plasma Glucose: 140 mg/dL — ABNORMAL HIGH (ref <117)

## 2011-11-25 MED ORDER — AMOXICILLIN 875 MG PO TABS: 875.0000 mg | ORAL_TABLET | Freq: Two times a day (BID) | ORAL | Status: AC

## 2011-11-25 NOTE — Progress Notes (Signed)
Subjective:   HPI  Robyn Hill is a 66 y.o. female who presents for routine chronic disease f/u. Last visit here August 2012.  Diabetes - checking glucose routinely, numbers ranging 70-125 fasting morning glucose.  Compliant with medication. No complaints.  Lipids - she has started the Lipitor since last visit, still taking colestipol. Trying to eat healthy. No medication adverse effect  Glaucoma - last eye Dr. visit a year ago, she sees them routinely every year. She is due next month.  HTN - no complaints, compliant with medication  Cancer screening - she has a history of uterine cancer, status post total hysterectomy, per her report last 3 Pap smears were normal, was advised by her prior gynecologist that she no longer needed Pap smears.  Vit D deficiency - she is taking vitamin D 1000 IUs daily  Elevated liver enzymes - long history of elevated liver test, prior liver biopsy, unsure of the actual cause other than known fatty liver disease  Sinus infection c/o - 2 week history of sinus pressure, headache, nasal congestion, not improving regardless of Flonase and nasal saline.  She reports lately getting cramps in her lower legs feet and calves at nighttime. Denies restless legs, denies any daytime pain in her legs or cramping. She has had this problem in the past, used quinine in the past.  She has a growth on her right lower back, had this excised in the past, and would like a referral to general surgery for excision since it has regrown  No other c/o.  The following portions of the patient's history were reviewed and updated as appropriate: allergies, current medications, past family history, past medical history, past social history, past surgical history and problem list.  Past Medical History  Diagnosis Date  . Hypertension   . Diabetes mellitus   . Hyperlipidemia   . Glaucoma   . Urinary incontinence   . Chronic back pain   . Osteoarthritis   . Edema   . Female  bladder prolapse   . Allergy   . Migraine   . GERD (gastroesophageal reflux disease)   . Diverticulosis   . Chronic diarrhea     Colestipol therapy  . History of uterine cancer   . History of anemia   . Vertigo   . Depression   . Anxiety   . Adrenal mass, right     Allergies  Allergen Reactions  . Kiwi Extract   . Oxycodone     Extended release only makes her nauseated.  Can tolerate the immediate release  . Sunflowerseed Oil     Current Outpatient Prescriptions on File Prior to Visit  Medication Sig Dispense Refill  . ACCU-CHEK COMPACT STRIPS test strip TEST ONCE DAILY  51 each  3  . atorvastatin (LIPITOR) 40 MG tablet Take 1 tablet (40 mg total) by mouth daily.  90 tablet  1  . brimonidine (ALPHAGAN P) 0.1 % SOLN        . colestipol (COLESTID) 1 G tablet 1 tablet po QID  360 tablet  3  . DEXILANT 60 MG capsule TAKE 1 CAPSULE 2 TIMES A DAY  60 capsule  3  . DULoxetine (CYMBALTA) 60 MG capsule Take 1 capsule (60 mg total) by mouth 2 (two) times daily.  180 capsule  3  . enalapril (VASOTEC) 20 MG tablet take 1 tablet by mouth once daily  30 tablet  5  . hydrochlorothiazide (HYDRODIURIL) 25 MG tablet take 1 tablet by mouth once daily  30 tablet  5  . HYDROcodone-acetaminophen (VICODIN) 5-500 MG per tablet Take 1 tablet by mouth every 6 (six) hours as needed for pain.  120 tablet  1  . JANUMET 50-1000 MG per tablet TAKE 1 TABLET BY MOUTH TWICE A DAY WITH FOOD--STOP METFORMIN  60 tablet  4  . meloxicam (MOBIC) 7.5 MG tablet Take 7.5 mg by mouth 2 (two) times daily.           Review of Systems Constitutional: denies fever, chills, sweats, unexpected weight change, fatigue ENT: no runny nose, ear pain, sore throat Cardiology: denies chest pain, palpitations, edema Respiratory: denies cough, shortness of breath, wheezing Gastroenterology: denies abdominal pain, nausea, vomiting, +intermittent chronic diarrhea, improved with Colestipol, constipation  Hematology: denies bleeding  or bruising problems Ophthalmology: denies vision changes Urology: denies dysuria, difficulty urinating, hematuria, urinary frequency, urgency     Objective:   Physical Exam  General appearance: alert, no distress, WD/WN HEENT: normocephalic, sclerae anicteric, moderate left frontal sinus tenderness,TMs pearly, nares patent, no discharge or erythema, pharynx normal Oral cavity: MMM, no lesions Neck: supple, no lymphadenopathy Heart: RRR, normal S1, S2, no murmurs Lungs: CTA bilaterally, no wheezes, rhonchi, or rales Abdomen: +bs, soft, non tender, non distended, no masses, no hepatomegaly, no splenomegaly Pulses: 2+ symmetric, upper and lower extremities, normal cap refill Ext: no edema Skin: no foot lesions Neuro: normal monofilament exam, normal foot sensation   Assessment and Plan :     Encounter Diagnoses  Name Primary?  . Type II or unspecified type diabetes mellitus without mention of complication, not stated as uncontrolled Yes  . Elevated liver enzymes   . Need for hepatitis B vaccination   . Encounter for long-term (current) use of other medications   . Hyperlipidemia   . Sinusitis   . Lipoma   . Cramp in limb    I reviewed her prior labs. Viral hepatitis screen negative in October 2011. She is hep B surface antibody negative and will need hepatitis B vaccination. Urine micro albumin normal 8/12, CBC, urinalysis normal 8/12, CMET normal 8/12, last hemoglobin A1c 6.6% 8/12, last lipid not at goal in November 2012, vitamin D low 8/12.  Her AST and ALT have continued to be elevated without other elevated liver enzymes for quite some time  Diabetes - labs today, c/t same medication, f/u with ophthalmologist 12/2011 as planned, daily foot checks, diabetic diet, exercise regularly.  Lipids - c/t Lipitor and Colestipol.  We changed to Lipitor last visit, so will await lab results.    Glaucoma - f/u with ophthalmology 12/2011 as planned  HTN - controlled on current  medication.  Vaccinations - abstracted her vaccine history in chart today.  She will call her insurer to check on Hep B vaccine costs.  Cancer screening - mammogram 06/2011, she notes hx/o 3 normal pap smears since her hysterectomy.  Advised breast/pelvic/rectal exam no later than 06/2012.  Colonoscopy 10/2008.    Adrenal mass - CT abdomen last year, not due in for a repeat until the soonest summertime of this year  Vit D deficiency - c/t Vit D 1000 IU daily.  Elevated liver enzymes - she has long history of elevated LFTs.  I have been unable to get copy of prior liver biopsy.   We will try one more time to obtain this.  Records release signed today.   Additional labs today to eval the elevated LFTs. I suspect this is just related to fatty liver disease/NASH, but we will rule out  other causes.  Preventative care - bone density normal 8/12.  Sinus infection - Amoxicillin, discussed supportive care.   Call/return if not improving.   Lipoma-right lower back, referral to Dr. Cristy Folks, general surgery in [facility]  Cramping in legs --- labs today  Followup pending labs.

## 2011-11-27 LAB — PROTEIN ELECTROPHORESIS, SERUM
Albumin ELP: 58.4 % (ref 55.8–66.1)
Alpha-1-Globulin: 4.7 % (ref 2.9–4.9)
Alpha-2-Globulin: 12.8 % — ABNORMAL HIGH (ref 7.1–11.8)
Beta 2: 6 % (ref 3.2–6.5)
Beta Globulin: 7.8 % — ABNORMAL HIGH (ref 4.7–7.2)
Gamma Globulin: 10.3 % — ABNORMAL LOW (ref 11.1–18.8)
Total Protein, Serum Electrophoresis: 6.8 g/dL (ref 6.0–8.3)

## 2011-12-03 DIAGNOSIS — D1779 Benign lipomatous neoplasm of other sites: Secondary | ICD-10-CM | POA: Diagnosis not present

## 2011-12-08 DIAGNOSIS — L282 Other prurigo: Secondary | ICD-10-CM | POA: Diagnosis not present

## 2011-12-08 DIAGNOSIS — L570 Actinic keratosis: Secondary | ICD-10-CM | POA: Diagnosis not present

## 2011-12-08 DIAGNOSIS — L0100 Impetigo, unspecified: Secondary | ICD-10-CM | POA: Diagnosis not present

## 2011-12-15 ENCOUNTER — Other Ambulatory Visit: Payer: Self-pay | Admitting: Medical

## 2011-12-16 NOTE — Telephone Encounter (Signed)
RX refill

## 2011-12-23 DIAGNOSIS — IMO0002 Reserved for concepts with insufficient information to code with codable children: Secondary | ICD-10-CM | POA: Diagnosis not present

## 2011-12-23 DIAGNOSIS — M545 Low back pain, unspecified: Secondary | ICD-10-CM | POA: Diagnosis not present

## 2011-12-28 DIAGNOSIS — Z79899 Other long term (current) drug therapy: Secondary | ICD-10-CM | POA: Diagnosis not present

## 2011-12-28 DIAGNOSIS — Z8542 Personal history of malignant neoplasm of other parts of uterus: Secondary | ICD-10-CM | POA: Diagnosis not present

## 2011-12-28 DIAGNOSIS — D235 Other benign neoplasm of skin of trunk: Secondary | ICD-10-CM | POA: Diagnosis not present

## 2011-12-28 DIAGNOSIS — K219 Gastro-esophageal reflux disease without esophagitis: Secondary | ICD-10-CM | POA: Diagnosis not present

## 2011-12-28 DIAGNOSIS — E559 Vitamin D deficiency, unspecified: Secondary | ICD-10-CM | POA: Diagnosis not present

## 2011-12-28 DIAGNOSIS — D1779 Benign lipomatous neoplasm of other sites: Secondary | ICD-10-CM | POA: Diagnosis not present

## 2012-01-20 DIAGNOSIS — M545 Low back pain, unspecified: Secondary | ICD-10-CM | POA: Diagnosis not present

## 2012-01-20 DIAGNOSIS — M5137 Other intervertebral disc degeneration, lumbosacral region: Secondary | ICD-10-CM | POA: Diagnosis not present

## 2012-01-20 DIAGNOSIS — IMO0002 Reserved for concepts with insufficient information to code with codable children: Secondary | ICD-10-CM | POA: Diagnosis not present

## 2012-01-25 ENCOUNTER — Other Ambulatory Visit: Payer: Self-pay | Admitting: Medical

## 2012-01-28 DIAGNOSIS — M545 Low back pain, unspecified: Secondary | ICD-10-CM | POA: Diagnosis not present

## 2012-01-28 DIAGNOSIS — M5137 Other intervertebral disc degeneration, lumbosacral region: Secondary | ICD-10-CM | POA: Diagnosis not present

## 2012-02-03 ENCOUNTER — Other Ambulatory Visit: Payer: Self-pay | Admitting: Medical

## 2012-02-15 DIAGNOSIS — M5137 Other intervertebral disc degeneration, lumbosacral region: Secondary | ICD-10-CM | POA: Diagnosis not present

## 2012-02-15 DIAGNOSIS — M4310 Spondylolisthesis, site unspecified: Secondary | ICD-10-CM | POA: Diagnosis not present

## 2012-02-15 DIAGNOSIS — IMO0002 Reserved for concepts with insufficient information to code with codable children: Secondary | ICD-10-CM | POA: Diagnosis not present

## 2012-02-15 DIAGNOSIS — M47817 Spondylosis without myelopathy or radiculopathy, lumbosacral region: Secondary | ICD-10-CM | POA: Diagnosis not present

## 2012-02-16 ENCOUNTER — Telehealth: Payer: Self-pay | Admitting: Medical

## 2012-02-16 NOTE — Telephone Encounter (Signed)
i don't need to see her at this time.  Did she get the lipoma cut off per Dr. Eliseo Squires?    i hope she does well with her upcoming surgery.

## 2012-02-17 ENCOUNTER — Encounter: Payer: Self-pay | Admitting: *Deleted

## 2012-02-17 NOTE — Telephone Encounter (Signed)
PATIENT STATES THAT SHE DID HAVE THE LIPOMA CUT OFF. SHE STATES SHE IS IN A LOT OF PAIN AND THAT SHE IS GETTING READY FOR HER BACK SURGERY. CLS

## 2012-02-22 ENCOUNTER — Other Ambulatory Visit: Payer: Self-pay | Admitting: Neurosurgery

## 2012-03-16 ENCOUNTER — Other Ambulatory Visit: Payer: Self-pay | Admitting: Medical

## 2012-03-22 ENCOUNTER — Encounter (HOSPITAL_COMMUNITY): Payer: Self-pay

## 2012-03-22 ENCOUNTER — Encounter (HOSPITAL_COMMUNITY): Payer: Self-pay | Admitting: *Deleted

## 2012-03-22 ENCOUNTER — Inpatient Hospital Stay (HOSPITAL_COMMUNITY): Admission: RE | Admit: 2012-03-22 | Discharge: 2012-03-22 | Payer: Medicare Other | Source: Ambulatory Visit

## 2012-03-22 HISTORY — DX: Encounter for other specified aftercare: Z51.89

## 2012-03-22 HISTORY — DX: Vitamin D deficiency, unspecified: E55.9

## 2012-03-22 HISTORY — DX: Diverticulitis of intestine, part unspecified, without perforation or abscess without bleeding: K57.92

## 2012-03-22 HISTORY — DX: Personal history of other diseases of the digestive system: Z87.19

## 2012-03-22 HISTORY — DX: Rheumatic fever without heart involvement: I00

## 2012-03-22 NOTE — Pre-Procedure Instructions (Signed)
20 Robyn Hill  03/22/2012   Your procedure is scheduled on:  Tuesday, June 18th.  Report to [facility] at 5:30AM.  Call this number if you have problems the morning of surgery: [phone]   Remember:   Do not eat food:After Midnight.  May have clear liquids: up to 4 Hours before arrival.  Clear liquids include soda, tea, black coffee, apple or grape juice, broth.  Take these medicines the morning of surgery with A SIP OF WATER: Dexilant, Duloxetine (Cymbalta).  Hydrocodone- Acetaminophen (Vicodan) and Colestipol (Colestid) if needed.   Do not wear jewelry, make-up or nail polish.  Do not wear lotions, powders, or perfumes. You may wear deodorant.  Do not shave 48 hours prior to surgery. Men may shave face and neck.  Do not bring valuables to the hospital.  Contacts, dentures or bridgework may not be worn into surgery.  Leave suitcase in the car. After surgery it may be brought to your room.  For patients admitted to the hospital, checkout time is 11:00 AM the day of discharge.   Patients discharged the day of surgery will not be allowed to drive home.  Name and phone number of your driver: NA  Special Instructions: CHG Shower Use Special Wash: 1/2 bottle night before surgery and 1/2 bottle morning of surgery.   Please read over the following fact sheets that you were given: Pain Booklet, Coughing and Deep Breathing, Blood Transfusion Information, MRSA Information and Surgical Site Infection Prevention

## 2012-03-23 ENCOUNTER — Encounter (HOSPITAL_COMMUNITY): Payer: Self-pay | Admitting: Respiratory Therapy

## 2012-03-28 ENCOUNTER — Encounter (HOSPITAL_COMMUNITY)
Admission: RE | Admit: 2012-03-28 | Discharge: 2012-03-28 | Disposition: A | Discharge status: Home / Self Care | Payer: Medicare Other | Source: Ambulatory Visit | Attending: Neurosurgery | Admitting: Neurosurgery

## 2012-03-28 ENCOUNTER — Encounter (HOSPITAL_COMMUNITY): Payer: Self-pay

## 2012-03-28 DIAGNOSIS — E119 Type 2 diabetes mellitus without complications: Secondary | ICD-10-CM | POA: Diagnosis not present

## 2012-03-28 DIAGNOSIS — I517 Cardiomegaly: Secondary | ICD-10-CM | POA: Diagnosis not present

## 2012-03-28 DIAGNOSIS — M5137 Other intervertebral disc degeneration, lumbosacral region: Secondary | ICD-10-CM | POA: Diagnosis not present

## 2012-03-28 DIAGNOSIS — M4310 Spondylolisthesis, site unspecified: Secondary | ICD-10-CM | POA: Diagnosis not present

## 2012-03-28 DIAGNOSIS — K219 Gastro-esophageal reflux disease without esophagitis: Secondary | ICD-10-CM | POA: Diagnosis not present

## 2012-03-28 DIAGNOSIS — Z01811 Encounter for preprocedural respiratory examination: Secondary | ICD-10-CM | POA: Diagnosis not present

## 2012-03-28 LAB — COMPREHENSIVE METABOLIC PANEL
ALT: 53 U/L — ABNORMAL HIGH (ref 0–35)
AST: 42 U/L — ABNORMAL HIGH (ref 0–37)
Albumin: 4.2 g/dL (ref 3.5–5.2)
Alkaline Phosphatase: 93 U/L (ref 39–117)
BUN: 23 mg/dL (ref 6–23)
CO2: 29 mEq/L (ref 19–32)
Calcium: 10.2 mg/dL (ref 8.4–10.5)
Chloride: 98 mEq/L (ref 96–112)
Creatinine, Ser: 0.61 mg/dL (ref 0.50–1.10)
GFR calc Af Amer: >90 mL/min (ref >90)
GFR calc non Af Amer: >90 mL/min (ref >90)
Glucose, Bld: 146 mg/dL — ABNORMAL HIGH (ref 70–99)
Potassium: 4.4 mEq/L (ref 3.5–5.1)
Sodium: 137 mEq/L (ref 135–145)
Total Bilirubin: 0.4 mg/dL (ref 0.3–1.2)
Total Protein: 7.8 g/dL (ref 6.0–8.3)

## 2012-03-28 LAB — CBC
HCT: 41.1 % (ref 36.0–46.0)
Hemoglobin: 13.6 g/dL (ref 12.0–15.0)
MCH: 26.8 pg (ref 26.0–34.0)
MCHC: 33.1 g/dL (ref 30.0–36.0)
MCV: 81.1 fL (ref 78.0–100.0)
Platelets: 346 10*3/uL (ref 150–400)
RBC: 5.07 MIL/uL (ref 3.87–5.11)
RDW: 12.9 % (ref 11.5–15.5)
WBC: 13.4 10*3/uL — ABNORMAL HIGH (ref 4.0–10.5)

## 2012-03-28 LAB — SURGICAL PCR SCREEN
MRSA, PCR: NEGATIVE
Staphylococcus aureus: NEGATIVE

## 2012-03-28 NOTE — Progress Notes (Addendum)
Pt had cardiac cath in [place] about 6-7 yrs ago...and an UGI in [place].... No problems with heart.... Has to take any pills or tablets with applesauce, banana....... PCP has not ordered any other dx tests.........da  Pt does not want to wear the blue blood band x 7-8 days.Marland KitchenMarland KitchenMarland KitchenI did inform and reiterate that she will have to have sample drawn the dos...she agreed....  DA 11:15 am......Marland KitchenHave called PCP--they have no other EKG for comparison.......Marland Kitchen

## 2012-03-30 DIAGNOSIS — H538 Other visual disturbances: Secondary | ICD-10-CM | POA: Diagnosis not present

## 2012-03-30 DIAGNOSIS — E119 Type 2 diabetes mellitus without complications: Secondary | ICD-10-CM | POA: Diagnosis not present

## 2012-03-30 DIAGNOSIS — H40159 Residual stage of open-angle glaucoma, unspecified eye: Secondary | ICD-10-CM | POA: Diagnosis not present

## 2012-03-30 DIAGNOSIS — H2510 Age-related nuclear cataract, unspecified eye: Secondary | ICD-10-CM | POA: Diagnosis not present

## 2012-04-04 MED ORDER — CEFAZOLIN SODIUM-DEXTROSE 2-3 GM-% IV SOLR
2.0000 g | INTRAVENOUS | Status: AC
  Administered 2012-04-05: 2 g via INTRAVENOUS
  Filled 2012-04-04: qty 50

## 2012-04-04 NOTE — H&P (Signed)
Robyn Hill  #[id number] DOB:  1946/06/20 02/15/2012:  Robyn Hill returns today. She has had left L4-5 epidural steroid injections by Dr. Ollen Bowl on 01/27/2012.  She says the injection helped two to three days only. She is having poor sleep due to pain. She has been taking Hydrocodone 5/325 and used a couple of her husband's 10/325 tablets. She has also been using Flector patches without a great deal of relief of her discomfort.    Her current medications - Enalapril 20 mg. qam, Hydrochlorothiazide 25 mg. qd, Meloxicam 7.5 mg. b.i.d., Janumet 50/1000 mg. b.i.d., Dexilant 60 mg. b.i.d., Pravastatin 40 mg. q.h.s., Cymbalta 60 mg. b.i.d., Hydrocodone 5/500 q.h.s., and glaucoma medications including Alphagan one drop in right every q12h and Cosopt one drop in right eye q12h.  ALLERGIES - KIWI FRUIT, SUNFLOWER SEEDS, OXYCODONE EXTENDED RELEASE.    She has been quite miserable with her level of discomfort and is not getting relief from that.  She has known spondylolisthesis of L4 on L5 with spinal stenosis at that level.  I believe that she is still symptomatic from that despite injection therapy.    At this point she wants to go ahead with surgery to get some relief of her pain and we plan on going ahead with L4-5 decompression and fusion, and she has been scheduled for 03/04/2012.  She has been fitted for a lumbosacral orthosis.  Risks and benefits were discussed in detail with the patient and she wishes to proceed with surgery. She was given a prescription of Percocet for pain management.          Danae Orleans. 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  She notes also bilateral hip and low back pain.  She says she has had numbness in her shoulders going to her fingers increasing lately but has been  around for the last two years.  She also notes jaw numbness and pain, and arthritis with known TMJ dysfunction.  Injections are planned by an orthodontist.  She has had lumbar epidural steroid injections in 2008, 2009 and in 2010 for her low back.    She has been taking Mobic 7.5 mg. b.i.d., Cymbalta 60 mg. 2 per day which she says helps her back and hips.    REVIEW OF SYSTEMS:   A detailed Review of Systems sheet was reviewed with the patient.  Pertinent positives include glasses, glaucoma, cataracts, ringing in ears, balance disturbance, nasal congestion/drainage, mouth sores, high blood pressure, high cholesterol, leg pain while walking, liver disease, incontinence, arm weakness, leg weakness, back pain, arm pain, leg pain, joint pain or swelling, arthritis, neck pain, problems with memory and diabetes.    PAST MEDICAL HISTORY:      Current Medical Conditions:    Significant for hypertension, gastroesophageal reflux disease, diabetes mellitus, uterine cancer for which she had surgery in 1980 which consisted of a radical hysterectomy.  Please see typed sheet for further details.    Prior Operations and Hospitalizations:   As previously described.  Please see typed sheet for further details.    Medications and Allergies:   Enalapril 20 mg. q.a.m., HCTZ 25 mg. q.d., Meloxicam 7.5 mg. b.i.d., Janumet 50/1000 2 q.d., Dexilant 60 mg. b.i.d., Pravastatin 40 mg. q.h.s., Cymbalta 60 mg. b.i.d., Alphagan 1 drop q.12.h., Cosopt  Ocumeter plus 1 drop q.12.h.  ALLERGIC TO KIWI FRUIT, SUNFLOWER SEEDS, OXYCODONE EXTENDED RELEASE.      Height and Weight:     5' 5.5" tall, 215 pounds.       FAMILY HISTORY:    Mother died at age 79 of aplastic anemia.  Father died at age 78 of heart disease.     SOCIAL HISTORY:    She denies tobacco, alcohol or drug use.   DIAGNOSTIC STUDIES:   I reviewed an MRI of Robyn Hill' lumbar spine in 2008 in [place], [place] which demonstrates significant spondylosis and stenosis with  spondylolisthesis at the L4-5 level with severe spinal stenosis.  She also has degenerative disc disease at the L5-S1 level.  I also reviewed an MRI of her cervical spine which was performed through [employer] [facility] through [facility] which demonstrates some disc bulges at C5-6 and C6-7 which contacts and mildly deforms the ventral cord with left worse than right foraminal narrowing at each level with uncovertebral disease and disc narrowing at C3-4 resulting in bilateral foraminal stenosis with some encroachment of both C4 nerve roots.  C4-5 level is fairly unremarkable and the spinal canal is not compressed at any level.    PHYSICAL EXAMINATION:      General Appearance:   On examination today, Robyn Hill is a pleasant and cooperative, obese woman in no acute distress.      Blood Pressure, Pulse, Respirations:   118/82, heart rate 76 and regular, respirations 18.       HEENT - normocephalic, atraumatic.  The pupils are equal, round and reactive to light.  The extraocular muscles are intact.  Sclerae - white.  Conjunctiva - pink.  Oropharynx benign.  Uvula midline.     Neck - there are no masses, meningismus, deformities, tracheal deviation, jugular vein distention or carotid bruits.  There is normal cervical range of motion.  Spurlings' test is negative without reproducible radicular pain turning the patient's head to either side.  Lhermitte's sign is not present with axial compression.  She does have some discomfort of the TMJ on the right as well as paravertebral musculature with some mild parascapular discomfort to palpation.      Respiratory - there is normal respiratory effort with good intercostal function.  Lungs are clear to auscultation.  There are no rales, rhonchi or wheezes.      Cardiovascular - the heart has regular rate and rhythm to auscultation.  No murmurs are appreciated.  There is no extremity edema, cyanosis or clubbing.  There are palpable pedal pulses.      Abdomen  - soft, nontender, no hepatosplenomegaly appreciated or masses.  There are active bowel sounds.  No guarding or rebound.      Musculoskeletal Examination - she cannot stand for long periods of time.  She is able to bend to touch her toes and able to stand on her heels and toes.  She has a negative straight leg raise.    NEUROLOGICAL EXAMINATION:  The patient is oriented to time, person and place and has good recall of both recent and remote memory with normal attention span and concentration.  The patient speaks with clear and fluent speech and exhibits normal language function and appropriate fund of knowledge.      Cranial Nerve Examination - pupils are equal, round and reactive to light.  Extraocular movements are full.  Visual fields are full to confrontational testing.  Facial sensation and facial movement are  symmetric and intact.  Hearing is intact to finger rub.  Palate is upgoing.  Shoulder shrug is symmetric.  Tongue protrudes in the midline.      Motor Examination - motor strength is 5/5 in the bilateral deltoids, biceps, triceps, handgrips, wrist extensors, interosseous.  In the lower extremities motor strength is 5/5 in hip flexion, extension, quadriceps, hamstrings, plantar flexion, dorsiflexion and extensor hallucis longus.      Sensory Examination - normal to light touch and pinprick sensation in the upper and lower extremities.     Deep Tendon Reflexes - 2 in the biceps, triceps, and brachioradialis, 2 in the knees, 2 in the ankles.  The great toes are downgoing to plantar stimulation.  No pathologic reflexes.       Cerebellar Examination - normal coordination in upper and lower extremities and normal rapid alternating movements.  Romberg test is negative.    IMPRESSION AND RECOMMENDATIONS: Robyn Hill is a 66 year old woman with neck pain.  She has spondylosis and multilevel foraminal stenosis without frank weakness.  I suggested a course of physical therapy for her neck  discomfort including home traction to see if she does well with a home traction device.  I believe that her lumbar spinal stenosis and spondylolisthesis which was fairly marked in 2008 is still problematic for her.  I recommended we get an MRI and radiographs of her lumbar spine so that I may review this.  She also is complaining of midback pain and says she was told she had multiple disc protrusions in her thoracic spine as well and wanted to see what was happening with those.  We plan on assessing that at the same time.  I will see her back after those studies have been done and she has a chance to engage in physical therapy.    [facility]    Danae Orleans. Venetia Maxon, M.D.

## 2012-04-05 ENCOUNTER — Encounter (HOSPITAL_COMMUNITY)
Admission: RE | Disposition: A | Discharge status: Home / Self Care | Payer: Self-pay | Source: Ambulatory Visit | Attending: Neurosurgery

## 2012-04-05 ENCOUNTER — Inpatient Hospital Stay (HOSPITAL_COMMUNITY): Payer: Medicare Other

## 2012-04-05 ENCOUNTER — Inpatient Hospital Stay (HOSPITAL_COMMUNITY)
Admission: RE | Admit: 2012-04-05 | Discharge: 2012-04-09 | DRG: 460 | Disposition: A | Discharge status: Home / Self Care | Payer: Medicare Other | Source: Ambulatory Visit | Attending: Neurosurgery | Admitting: Neurosurgery

## 2012-04-05 ENCOUNTER — Inpatient Hospital Stay (HOSPITAL_COMMUNITY): Payer: Medicare Other | Admitting: Critical Care Medicine

## 2012-04-05 ENCOUNTER — Encounter (HOSPITAL_COMMUNITY): Payer: Self-pay | Admitting: Critical Care Medicine

## 2012-04-05 ENCOUNTER — Encounter (HOSPITAL_COMMUNITY): Payer: Self-pay | Admitting: *Deleted

## 2012-04-05 DIAGNOSIS — E119 Type 2 diabetes mellitus without complications: Secondary | ICD-10-CM | POA: Diagnosis not present

## 2012-04-05 DIAGNOSIS — K449 Diaphragmatic hernia without obstruction or gangrene: Secondary | ICD-10-CM | POA: Diagnosis present

## 2012-04-05 DIAGNOSIS — M51379 Other intervertebral disc degeneration, lumbosacral region without mention of lumbar back pain or lower extremity pain: Secondary | ICD-10-CM | POA: Diagnosis present

## 2012-04-05 DIAGNOSIS — I10 Essential (primary) hypertension: Secondary | ICD-10-CM | POA: Diagnosis present

## 2012-04-05 DIAGNOSIS — IMO0002 Reserved for concepts with insufficient information to code with codable children: Secondary | ICD-10-CM | POA: Diagnosis not present

## 2012-04-05 DIAGNOSIS — K219 Gastro-esophageal reflux disease without esophagitis: Secondary | ICD-10-CM | POA: Diagnosis present

## 2012-04-05 DIAGNOSIS — M5137 Other intervertebral disc degeneration, lumbosacral region: Secondary | ICD-10-CM | POA: Diagnosis not present

## 2012-04-05 DIAGNOSIS — Z8542 Personal history of malignant neoplasm of other parts of uterus: Secondary | ICD-10-CM | POA: Diagnosis not present

## 2012-04-05 DIAGNOSIS — M545 Low back pain, unspecified: Secondary | ICD-10-CM | POA: Diagnosis not present

## 2012-04-05 DIAGNOSIS — M4310 Spondylolisthesis, site unspecified: Principal | ICD-10-CM | POA: Diagnosis present

## 2012-04-05 DIAGNOSIS — M5090 Cervical disc disorder, unspecified, unspecified cervical region: Secondary | ICD-10-CM | POA: Diagnosis not present

## 2012-04-05 DIAGNOSIS — Z981 Arthrodesis status: Secondary | ICD-10-CM | POA: Diagnosis not present

## 2012-04-05 DIAGNOSIS — K5900 Constipation, unspecified: Secondary | ICD-10-CM | POA: Diagnosis present

## 2012-04-05 HISTORY — DX: Unspecified asthma, uncomplicated: J45.909

## 2012-04-05 HISTORY — DX: Blindness, one eye, unspecified eye: H54.40

## 2012-04-05 HISTORY — DX: Fatty (change of) liver, not elsewhere classified: K76.0

## 2012-04-05 HISTORY — PX: POSTERIOR FUSION LUMBAR SPINE: SUR632

## 2012-04-05 HISTORY — DX: Anemia, unspecified: D64.9

## 2012-04-05 HISTORY — DX: Type 2 diabetes mellitus without complications: E11.9

## 2012-04-05 LAB — GLUCOSE, CAPILLARY
Glucose-Capillary: 138 mg/dL — ABNORMAL HIGH (ref 70–99)
Glucose-Capillary: 169 mg/dL — ABNORMAL HIGH (ref 70–99)
Glucose-Capillary: 138 mg/dL — ABNORMAL HIGH (ref 70–99)
Glucose-Capillary: 159 mg/dL — ABNORMAL HIGH (ref 70–99)

## 2012-04-05 LAB — ABO/RH: ABO/RH(D): B POS

## 2012-04-05 LAB — TYPE AND SCREEN
ABO/RH(D): B POS
Antibody Screen: NEGATIVE

## 2012-04-05 SURGERY — POSTERIOR LUMBAR FUSION 1 LEVEL
Anesthesia: General | Site: Back | Wound class: Clean

## 2012-04-05 MED ORDER — 0.9 % SODIUM CHLORIDE (POUR BTL) OPTIME
TOPICAL | Status: DC | PRN
  Administered 2012-04-05: 1000 mL

## 2012-04-05 MED ORDER — FLEET ENEMA 7-19 GM/118ML RE ENEM: 1.0000 | ENEMA | Freq: Once | RECTAL | Status: AC | PRN

## 2012-04-05 MED ORDER — OXYCODONE-ACETAMINOPHEN 5-325 MG PO TABS: 1.0000 | ORAL_TABLET | Freq: Four times a day (QID) | ORAL | Status: DC | PRN

## 2012-04-05 MED ORDER — BRIMONIDINE TARTRATE 0.1 % OP SOLN: 1.0000 [drp] | Freq: Two times a day (BID) | OPHTHALMIC | Status: DC

## 2012-04-05 MED ORDER — FENTANYL CITRATE 0.05 MG/ML IJ SOLN
25.0000 ug | INTRAMUSCULAR | Status: DC | PRN
  Administered 2012-04-05 (×2): 50 ug via INTRAVENOUS

## 2012-04-05 MED ORDER — MIDAZOLAM HCL 5 MG/5ML IJ SOLN
INTRAMUSCULAR | Status: DC | PRN
  Administered 2012-04-05: 2 mg via INTRAVENOUS

## 2012-04-05 MED ORDER — HYDROMORPHONE 0.3 MG/ML IV SOLN
INTRAVENOUS | Status: DC
  Administered 2012-04-05: 12:00:00 via INTRAVENOUS
  Administered 2012-04-05 (×2): 0.5 mg via INTRAVENOUS
  Administered 2012-04-05: 1.6 mg via INTRAVENOUS

## 2012-04-05 MED ORDER — SITAGLIPTIN PHOS-METFORMIN HCL 50-1000 MG PO TABS: 1.0000 | ORAL_TABLET | Freq: Two times a day (BID) | ORAL | Status: DC

## 2012-04-05 MED ORDER — ACETAMINOPHEN 650 MG RE SUPP: 650.0000 mg | RECTAL | Status: DC | PRN

## 2012-04-05 MED ORDER — FENTANYL CITRATE 0.05 MG/ML IJ SOLN
INTRAMUSCULAR | Status: AC
  Administered 2012-04-05: 50 ug via INTRAVENOUS
  Filled 2012-04-05: qty 2

## 2012-04-05 MED ORDER — VITAMIN D3 25 MCG (1000 UNIT) PO TABS
1000.0000 [IU] | ORAL_TABLET | Freq: Every day | ORAL | Status: DC
  Administered 2012-04-05 – 2012-04-09 (×5): 1000 [IU] via ORAL
  Filled 2012-04-05 (×5): qty 1

## 2012-04-05 MED ORDER — BUPIVACAINE HCL (PF) 0.5 % IJ SOLN
INTRAMUSCULAR | Status: DC | PRN
  Administered 2012-04-05: 5 mL

## 2012-04-05 MED ORDER — SODIUM CHLORIDE 0.9 % IJ SOLN: 9.0000 mL | INTRAMUSCULAR | Status: DC | PRN

## 2012-04-05 MED ORDER — OXYCODONE-ACETAMINOPHEN 5-325 MG PO TABS
1.0000 | ORAL_TABLET | ORAL | Status: DC | PRN
  Administered 2012-04-05 – 2012-04-08 (×7): 2 via ORAL
  Administered 2012-04-09: 1 via ORAL
  Administered 2012-04-09: 2 via ORAL
  Filled 2012-04-05 (×9): qty 2

## 2012-04-05 MED ORDER — MELOXICAM 7.5 MG PO TABS
7.5000 mg | ORAL_TABLET | Freq: Two times a day (BID) | ORAL | Status: DC
  Administered 2012-04-05 – 2012-04-07 (×5): 7.5 mg via ORAL
  Filled 2012-04-05 (×7): qty 1

## 2012-04-05 MED ORDER — PANTOPRAZOLE SODIUM 40 MG PO TBEC
40.0000 mg | DELAYED_RELEASE_TABLET | Freq: Every day | ORAL | Status: DC
  Administered 2012-04-06 – 2012-04-09 (×4): 40 mg via ORAL
  Filled 2012-04-05 (×3): qty 1

## 2012-04-05 MED ORDER — MENTHOL 3 MG MT LOZG: 1.0000 | LOZENGE | OROMUCOSAL | Status: DC | PRN

## 2012-04-05 MED ORDER — ONDANSETRON HCL 4 MG/2ML IJ SOLN
INTRAMUSCULAR | Status: DC | PRN
  Administered 2012-04-05 (×2): 4 mg via INTRAVENOUS

## 2012-04-05 MED ORDER — DIAZEPAM 5 MG PO TABS
5.0000 mg | ORAL_TABLET | Freq: Four times a day (QID) | ORAL | Status: DC | PRN
  Administered 2012-04-05 – 2012-04-09 (×8): 5 mg via ORAL
  Filled 2012-04-05 (×8): qty 1

## 2012-04-05 MED ORDER — HYDROCHLOROTHIAZIDE 25 MG PO TABS
25.0000 mg | ORAL_TABLET | Freq: Every day | ORAL | Status: DC
  Administered 2012-04-05 – 2012-04-08 (×4): 25 mg via ORAL
  Filled 2012-04-05 (×5): qty 1

## 2012-04-05 MED ORDER — ALUM & MAG HYDROXIDE-SIMETH 200-200-20 MG/5ML PO SUSP
30.0000 mL | Freq: Four times a day (QID) | ORAL | Status: DC | PRN
  Administered 2012-04-08: 30 mL via ORAL
  Filled 2012-04-05: qty 30

## 2012-04-05 MED ORDER — HEMOSTATIC AGENTS (NO CHARGE) OPTIME
TOPICAL | Status: DC | PRN
  Administered 2012-04-05: 1 via TOPICAL

## 2012-04-05 MED ORDER — KCL IN DEXTROSE-NACL 20-5-0.45 MEQ/L-%-% IV SOLN
INTRAVENOUS | Status: DC
  Administered 2012-04-05: 18:00:00 via INTRAVENOUS
  Administered 2012-04-06: 75 mL/h via INTRAVENOUS
  Filled 2012-04-05 (×9): qty 1000

## 2012-04-05 MED ORDER — ROCURONIUM BROMIDE 100 MG/10ML IV SOLN
INTRAVENOUS | Status: DC | PRN
  Administered 2012-04-05: 50 mg via INTRAVENOUS

## 2012-04-05 MED ORDER — FENTANYL CITRATE 0.05 MG/ML IJ SOLN: 25.0000 ug | INTRAMUSCULAR | Status: DC | PRN

## 2012-04-05 MED ORDER — ATORVASTATIN CALCIUM 10 MG PO TABS
10.0000 mg | ORAL_TABLET | Freq: Every day | ORAL | Status: DC
  Administered 2012-04-05 – 2012-04-08 (×4): 10 mg via ORAL
  Filled 2012-04-05 (×5): qty 1

## 2012-04-05 MED ORDER — INSULIN ASPART 100 UNIT/ML ~~LOC~~ SOLN
4.0000 [IU] | Freq: Three times a day (TID) | SUBCUTANEOUS | Status: DC
  Administered 2012-04-05 – 2012-04-09 (×9): 4 [IU] via SUBCUTANEOUS

## 2012-04-05 MED ORDER — FENTANYL CITRATE 0.05 MG/ML IJ SOLN
INTRAMUSCULAR | Status: DC | PRN
  Administered 2012-04-05 (×4): 50 ug via INTRAVENOUS
  Administered 2012-04-05: 150 ug via INTRAVENOUS

## 2012-04-05 MED ORDER — DULOXETINE HCL 60 MG PO CPEP
60.0000 mg | ORAL_CAPSULE | Freq: Two times a day (BID) | ORAL | Status: DC
  Administered 2012-04-05 – 2012-04-09 (×8): 60 mg via ORAL
  Filled 2012-04-05 (×9): qty 1

## 2012-04-05 MED ORDER — METFORMIN HCL 500 MG PO TABS
1000.0000 mg | ORAL_TABLET | Freq: Two times a day (BID) | ORAL | Status: DC
  Administered 2012-04-05 – 2012-04-09 (×8): 1000 mg via ORAL
  Filled 2012-04-05 (×10): qty 2

## 2012-04-05 MED ORDER — BRIMONIDINE TARTRATE 0.15 % OP SOLN
1.0000 [drp] | Freq: Two times a day (BID) | OPHTHALMIC | Status: DC
  Filled 2012-04-05: qty 5

## 2012-04-05 MED ORDER — ENALAPRIL MALEATE 5 MG PO TABS
5.0000 mg | ORAL_TABLET | Freq: Every day | ORAL | Status: DC
  Administered 2012-04-05 – 2012-04-08 (×3): 5 mg via ORAL
  Filled 2012-04-05 (×5): qty 1

## 2012-04-05 MED ORDER — THROMBIN 20000 UNITS EX KIT
PACK | CUTANEOUS | Status: DC | PRN
  Administered 2012-04-05: 08:00:00 via TOPICAL

## 2012-04-05 MED ORDER — INSULIN ASPART 100 UNIT/ML ~~LOC~~ SOLN: 0.0000 [IU] | Freq: Every day | SUBCUTANEOUS | Status: DC

## 2012-04-05 MED ORDER — ONDANSETRON HCL 4 MG/2ML IJ SOLN: 4.0000 mg | INTRAMUSCULAR | Status: DC | PRN

## 2012-04-05 MED ORDER — LATANOPROST 0.005 % OP SOLN
1.0000 [drp] | Freq: Every day | OPHTHALMIC | Status: DC
  Administered 2012-04-05 – 2012-04-08 (×4): 1 [drp] via OPHTHALMIC
  Filled 2012-04-05: qty 2.5

## 2012-04-05 MED ORDER — LACTATED RINGERS IV SOLN
INTRAVENOUS | Status: DC | PRN
  Administered 2012-04-05 (×3): via INTRAVENOUS

## 2012-04-05 MED ORDER — LIDOCAINE-EPINEPHRINE 1 %-1:100000 IJ SOLN
INTRAMUSCULAR | Status: DC | PRN
  Administered 2012-04-05: 5 mL

## 2012-04-05 MED ORDER — BISACODYL 10 MG RE SUPP
10.0000 mg | Freq: Every day | RECTAL | Status: DC | PRN
  Filled 2012-04-05: qty 1

## 2012-04-05 MED ORDER — SODIUM CHLORIDE 0.9 % IJ SOLN: 3.0000 mL | INTRAMUSCULAR | Status: DC | PRN

## 2012-04-05 MED ORDER — SENNA 8.6 MG PO TABS
1.0000 | ORAL_TABLET | Freq: Two times a day (BID) | ORAL | Status: DC
  Administered 2012-04-05 – 2012-04-09 (×9): 8.6 mg via ORAL
  Filled 2012-04-05 (×10): qty 1

## 2012-04-05 MED ORDER — PANTOPRAZOLE SODIUM 40 MG IV SOLR: 40.0000 mg | Freq: Every day | INTRAVENOUS | Status: DC

## 2012-04-05 MED ORDER — VECURONIUM BROMIDE 10 MG IV SOLR
INTRAVENOUS | Status: DC | PRN
  Administered 2012-04-05: 1 mg via INTRAVENOUS
  Administered 2012-04-05: 3 mg via INTRAVENOUS
  Administered 2012-04-05 (×2): 1 mg via INTRAVENOUS

## 2012-04-05 MED ORDER — BACITRACIN 50000 UNITS IM SOLR
INTRAMUSCULAR | Status: AC
  Filled 2012-04-05: qty 1

## 2012-04-05 MED ORDER — HYDROMORPHONE 0.3 MG/ML IV SOLN
INTRAVENOUS | Status: AC
  Filled 2012-04-05: qty 25

## 2012-04-05 MED ORDER — ONDANSETRON HCL 4 MG/2ML IJ SOLN: 4.0000 mg | Freq: Four times a day (QID) | INTRAMUSCULAR | Status: DC | PRN

## 2012-04-05 MED ORDER — PHENOL 1.4 % MT LIQD: 1.0000 | OROMUCOSAL | Status: DC | PRN

## 2012-04-05 MED ORDER — LIDOCAINE HCL (CARDIAC) 20 MG/ML IV SOLN
INTRAVENOUS | Status: DC | PRN
  Administered 2012-04-05: 60 mg via INTRAVENOUS

## 2012-04-05 MED ORDER — SODIUM CHLORIDE 0.9 % IJ SOLN
3.0000 mL | Freq: Two times a day (BID) | INTRAMUSCULAR | Status: DC
  Administered 2012-04-06 – 2012-04-09 (×6): 3 mL via INTRAVENOUS

## 2012-04-05 MED ORDER — HYDROMORPHONE HCL PF 1 MG/ML IJ SOLN
0.5000 mg | INTRAMUSCULAR | Status: DC | PRN
  Administered 2012-04-05 – 2012-04-07 (×9): 1 mg via INTRAVENOUS
  Filled 2012-04-05 (×9): qty 1

## 2012-04-05 MED ORDER — HYDROCODONE-ACETAMINOPHEN 5-325 MG PO TABS
1.0000 | ORAL_TABLET | ORAL | Status: DC | PRN
  Administered 2012-04-07 – 2012-04-09 (×3): 2 via ORAL
  Filled 2012-04-05 (×3): qty 2

## 2012-04-05 MED ORDER — LINAGLIPTIN 5 MG PO TABS
5.0000 mg | ORAL_TABLET | Freq: Every day | ORAL | Status: DC
  Administered 2012-04-05 – 2012-04-09 (×5): 5 mg via ORAL
  Filled 2012-04-05 (×5): qty 1

## 2012-04-05 MED ORDER — GLYCOPYRROLATE 0.2 MG/ML IJ SOLN
INTRAMUSCULAR | Status: DC | PRN
  Administered 2012-04-05: 0.4 mg via INTRAVENOUS

## 2012-04-05 MED ORDER — SODIUM CHLORIDE 0.9 % IV SOLN
INTRAVENOUS | Status: AC
  Filled 2012-04-05: qty 500

## 2012-04-05 MED ORDER — COLESTIPOL HCL 1 G PO TABS
4.0000 g | ORAL_TABLET | Freq: Every day | ORAL | Status: DC | PRN
  Filled 2012-04-05: qty 4

## 2012-04-05 MED ORDER — ACETAMINOPHEN 325 MG PO TABS: 650.0000 mg | ORAL_TABLET | ORAL | Status: DC | PRN

## 2012-04-05 MED ORDER — DIPHENHYDRAMINE HCL 12.5 MG/5ML PO ELIX: 12.5000 mg | ORAL_SOLUTION | Freq: Four times a day (QID) | ORAL | Status: DC | PRN

## 2012-04-05 MED ORDER — SODIUM CHLORIDE 0.9 % IV SOLN: 250.0000 mL | INTRAVENOUS | Status: DC

## 2012-04-05 MED ORDER — SODIUM CHLORIDE 0.9 % IR SOLN
Status: DC | PRN
  Administered 2012-04-05: 08:00:00

## 2012-04-05 MED ORDER — DIPHENHYDRAMINE HCL 50 MG/ML IJ SOLN: 12.5000 mg | Freq: Four times a day (QID) | INTRAMUSCULAR | Status: DC | PRN

## 2012-04-05 MED ORDER — INSULIN ASPART 100 UNIT/ML ~~LOC~~ SOLN
0.0000 [IU] | Freq: Three times a day (TID) | SUBCUTANEOUS | Status: DC
  Administered 2012-04-05: 2 [IU] via SUBCUTANEOUS
  Administered 2012-04-06 – 2012-04-07 (×3): 3 [IU] via SUBCUTANEOUS
  Administered 2012-04-07 – 2012-04-09 (×2): 2 [IU] via SUBCUTANEOUS

## 2012-04-05 MED ORDER — SENNOSIDES-DOCUSATE SODIUM 8.6-50 MG PO TABS
1.0000 | ORAL_TABLET | Freq: Every evening | ORAL | Status: DC | PRN
  Administered 2012-04-07: 1 via ORAL
  Filled 2012-04-05 (×2): qty 1

## 2012-04-05 MED ORDER — BRIMONIDINE TARTRATE 0.2 % OP SOLN
1.0000 [drp] | Freq: Two times a day (BID) | OPHTHALMIC | Status: DC
  Administered 2012-04-05 – 2012-04-09 (×8): 1 [drp] via OPHTHALMIC
  Filled 2012-04-05: qty 5

## 2012-04-05 MED ORDER — PROPOFOL 10 MG/ML IV EMUL
INTRAVENOUS | Status: DC | PRN
  Administered 2012-04-05: 150 mg via INTRAVENOUS

## 2012-04-05 MED ORDER — PANTOPRAZOLE SODIUM 40 MG PO TBEC: 40.0000 mg | DELAYED_RELEASE_TABLET | Freq: Every day | ORAL | Status: DC

## 2012-04-05 MED ORDER — NEOSTIGMINE METHYLSULFATE 1 MG/ML IJ SOLN
INTRAMUSCULAR | Status: DC | PRN
  Administered 2012-04-05: 3 mg via INTRAVENOUS

## 2012-04-05 MED ORDER — DORZOLAMIDE HCL-TIMOLOL MAL 2-0.5 % OP SOLN
1.0000 [drp] | Freq: Two times a day (BID) | OPHTHALMIC | Status: DC
  Administered 2012-04-05 – 2012-04-09 (×8): 1 [drp] via OPHTHALMIC
  Filled 2012-04-05: qty 10

## 2012-04-05 MED ORDER — NALOXONE HCL 0.4 MG/ML IJ SOLN: 0.4000 mg | INTRAMUSCULAR | Status: DC | PRN

## 2012-04-05 MED ORDER — CEFAZOLIN SODIUM 1-5 GM-% IV SOLN
1.0000 g | Freq: Three times a day (TID) | INTRAVENOUS | Status: AC
  Administered 2012-04-05 (×2): 1 g via INTRAVENOUS
  Filled 2012-04-05 (×2): qty 50

## 2012-04-05 SURGICAL SUPPLY — 77 items
BAG DECANTER FOR FLEXI CONT (MISCELLANEOUS) ×2 IMPLANT
BENZOIN TINCTURE PRP APPL 2/3 (GAUZE/BANDAGES/DRESSINGS) ×2 IMPLANT
BLADE SURG ROTATE 9660 (MISCELLANEOUS) IMPLANT
BONE VOID FILLER STRIP 10CC (Bone Implant) ×2 IMPLANT
BUR MATCHSTICK NEURO 3.0 LAGG (BURR) ×2 IMPLANT
BUR PRECISION FLUTE 5.0 (BURR) ×2 IMPLANT
CAGE 9MM (Cage) ×4 IMPLANT
CANISTER SUCTION 2500CC (MISCELLANEOUS) ×2 IMPLANT
CLOTH BEACON ORANGE TIMEOUT ST (SAFETY) ×2 IMPLANT
CONT SPEC 4OZ CLIKSEAL STRL BL (MISCELLANEOUS) ×4 IMPLANT
COVER BACK TABLE 24X17X13 BIG (DRAPES) IMPLANT
COVER TABLE BACK 60X90 (DRAPES) ×2 IMPLANT
DERMABOND ADVANCED (GAUZE/BANDAGES/DRESSINGS) ×1
DERMABOND ADVANCED .7 DNX12 (GAUZE/BANDAGES/DRESSINGS) ×1 IMPLANT
DRAPE C-ARM 42X72 X-RAY (DRAPES) ×4 IMPLANT
DRAPE LAPAROTOMY 100X72X124 (DRAPES) ×2 IMPLANT
DRAPE POUCH INSTRU U-SHP 10X18 (DRAPES) ×2 IMPLANT
DRAPE SURG 17X23 STRL (DRAPES) ×2 IMPLANT
DRESSING TELFA 8X3 (GAUZE/BANDAGES/DRESSINGS) ×4 IMPLANT
DURAPREP 26ML APPLICATOR (WOUND CARE) ×2 IMPLANT
ELECT BLADE 4.0 EZ CLEAN MEGAD (MISCELLANEOUS) ×2
ELECT REM PT RETURN 9FT ADLT (ELECTROSURGICAL) ×2
ELECTRODE BLDE 4.0 EZ CLN MEGD (MISCELLANEOUS) ×1 IMPLANT
ELECTRODE REM PT RTRN 9FT ADLT (ELECTROSURGICAL) ×1 IMPLANT
EVACUATOR 1/8 PVC DRAIN (DRAIN) ×2 IMPLANT
GAUZE SPONGE 4X4 16PLY XRAY LF (GAUZE/BANDAGES/DRESSINGS) IMPLANT
GLOVE BIO SURGEON STRL SZ8 (GLOVE) ×4 IMPLANT
GLOVE BIOGEL PI IND STRL 6.5 (GLOVE) ×3 IMPLANT
GLOVE BIOGEL PI IND STRL 8 (GLOVE) ×2 IMPLANT
GLOVE BIOGEL PI IND STRL 8.5 (GLOVE) ×2 IMPLANT
GLOVE BIOGEL PI INDICATOR 6.5 (GLOVE) ×3
GLOVE BIOGEL PI INDICATOR 8 (GLOVE) ×2
GLOVE BIOGEL PI INDICATOR 8.5 (GLOVE) ×2
GLOVE ECLIPSE 8.0 STRL XLNG CF (GLOVE) ×4 IMPLANT
GLOVE ECLIPSE 8.5 STRL (GLOVE) ×2 IMPLANT
GLOVE EXAM NITRILE LRG STRL (GLOVE) IMPLANT
GLOVE EXAM NITRILE MD LF STRL (GLOVE) ×2 IMPLANT
GLOVE EXAM NITRILE XL STR (GLOVE) IMPLANT
GLOVE EXAM NITRILE XS STR PU (GLOVE) IMPLANT
GLOVE INDICATOR 7.0 STRL GRN (GLOVE) ×4 IMPLANT
GOWN BRE IMP SLV AUR LG STRL (GOWN DISPOSABLE) IMPLANT
GOWN BRE IMP SLV AUR XL STRL (GOWN DISPOSABLE) ×6 IMPLANT
GOWN STRL REIN 2XL LVL4 (GOWN DISPOSABLE) ×4 IMPLANT
KIT BASIN OR (CUSTOM PROCEDURE TRAY) ×2 IMPLANT
KIT INFUSE SMALL (Orthopedic Implant) ×2 IMPLANT
KIT POSITION SURG JACKSON T1 (MISCELLANEOUS) ×2 IMPLANT
KIT ROOM TURNOVER OR (KITS) ×2 IMPLANT
MILL MEDIUM DISP (BLADE) ×2 IMPLANT
NEEDLE HYPO 25X1 1.5 SAFETY (NEEDLE) ×2 IMPLANT
NEEDLE SPNL 18GX3.5 QUINCKE PK (NEEDLE) IMPLANT
NS IRRIG 1000ML POUR BTL (IV SOLUTION) ×2 IMPLANT
PACK LAMINECTOMY NEURO (CUSTOM PROCEDURE TRAY) ×2 IMPLANT
PAD ARMBOARD 7.5X6 YLW CONV (MISCELLANEOUS) ×6 IMPLANT
PATTIES SURGICAL .5 X.5 (GAUZE/BANDAGES/DRESSINGS) IMPLANT
PATTIES SURGICAL .5 X1 (DISPOSABLE) IMPLANT
PATTIES SURGICAL 1X1 (DISPOSABLE) IMPLANT
ROD 35MM (Rod) ×2 IMPLANT
SCREW 40MM (Screw) ×2 IMPLANT
SCREW POLYAX 6.5X45MM (Screw) ×6 IMPLANT
SCREW SET (Screw) ×8 IMPLANT
SPONGE GAUZE 4X4 12PLY (GAUZE/BANDAGES/DRESSINGS) ×2 IMPLANT
SPONGE LAP 4X18 X RAY DECT (DISPOSABLE) IMPLANT
SPONGE SURGIFOAM ABS GEL 100 (HEMOSTASIS) IMPLANT
STAPLER SKIN PROX WIDE 3.9 (STAPLE) IMPLANT
STRIP CLOSURE SKIN 1/2X4 (GAUZE/BANDAGES/DRESSINGS) ×2 IMPLANT
SUT VIC AB 1 CT1 18XBRD ANBCTR (SUTURE) ×2 IMPLANT
SUT VIC AB 1 CT1 8-18 (SUTURE) ×2
SUT VIC AB 2-0 CT1 18 (SUTURE) ×2 IMPLANT
SUT VIC AB 3-0 SH 8-18 (SUTURE) ×2 IMPLANT
SYR 20ML ECCENTRIC (SYRINGE) ×2 IMPLANT
SYR 3ML LL SCALE MARK (SYRINGE) ×6 IMPLANT
SYR 5ML LL (SYRINGE) IMPLANT
TOWEL OR 17X24 6PK STRL BLUE (TOWEL DISPOSABLE) ×2 IMPLANT
TOWEL OR 17X26 10 PK STRL BLUE (TOWEL DISPOSABLE) ×2 IMPLANT
TRAP SPECIMEN MUCOUS 40CC (MISCELLANEOUS) ×2 IMPLANT
TRAY FOLEY CATH 14FRSI W/METER (CATHETERS) ×2 IMPLANT
WATER STERILE IRR 1000ML POUR (IV SOLUTION) ×2 IMPLANT

## 2012-04-05 NOTE — Progress Notes (Signed)
Clarified oxycodone allergy with patient.  Only nausea with ER Oxy.

## 2012-04-05 NOTE — Progress Notes (Signed)
As above.

## 2012-04-05 NOTE — Preoperative (Signed)
Beta Blockers   Reason not to administer Beta Blockers:Not Applicable 

## 2012-04-05 NOTE — Interval H&P Note (Signed)
History and Physical Interval Note:  04/05/2012 7:33 AM  Robyn Hill  has presented today for surgery, with the diagnosis of Spondylolisthesis, Lumbago, Lumbar degenerative disc disease, Lumbar radiculopathy  The various methods of treatment have been discussed with the patient and family. After consideration of risks, benefits and other options for treatment, the patient has consented to  Procedure(s) (LRB): POSTERIOR LUMBAR FUSION 1 LEVEL (N/A) as a surgical intervention .  The patient's history has been reviewed, patient examined, no change in status, stable for surgery.  I have reviewed the patients' chart and labs.  Questions were answered to the patient's satisfaction.     Kerolos Nehme D  Date of Initial H&P: 04/04/2012  History reviewed, patient examined, no change in status, stable for surgery.

## 2012-04-05 NOTE — Anesthesia Procedure Notes (Signed)
Procedure Name: Intubation Date/Time: 04/05/2012 7:46 AM Performed by: Elon Alas Pre-anesthesia Checklist: Patient identified, Timeout performed, Emergency Drugs available, Suction available and Patient being monitored Patient Re-evaluated:Patient Re-evaluated prior to inductionOxygen Delivery Method: Circle system utilized Preoxygenation: Pre-oxygenation with 100% oxygen Intubation Type: IV induction Ventilation: Mask ventilation without difficulty Laryngoscope Size: Mac and 3 Grade View: Grade I Tube type: Oral Tube size: 7.5 mm Number of attempts: 1 Airway Equipment and Method: Stylet Placement Confirmation: positive ETCO2,  ETT inserted through vocal cords under direct vision and breath sounds checked- equal and bilateral Secured at: 21 cm Tube secured with: Tape Dental Injury: Teeth and Oropharynx as per pre-operative assessment

## 2012-04-05 NOTE — Anesthesia Postprocedure Evaluation (Signed)
  Anesthesia Post-op Note  Patient: Robyn Hill  Procedure(s) Performed: Procedure(s) (LRB): POSTERIOR LUMBAR FUSION 1 LEVEL (N/A)  Patient Location: PACU  Anesthesia Type: General  Level of Consciousness: awake  Airway and Oxygen Therapy: Patient Spontanous Breathing  Post-op Pain: mild  Post-op Assessment: Post-op Vital signs reviewed  Post-op Vital Signs: Reviewed  Complications: No apparent anesthesia complications

## 2012-04-05 NOTE — Anesthesia Preprocedure Evaluation (Addendum)
Anesthesia Evaluation  Patient identified by MRN, date of birth, ID band Patient awake    Reviewed: Allergy & Precautions, H&P , NPO status , Patient's Chart, lab work & pertinent test results  Airway Mallampati: II      Dental  (+) Dental Advisory Given   Pulmonary neg pulmonary ROS,          Cardiovascular hypertension,     Neuro/Psych  Headaches,    GI/Hepatic Neg liver ROS, hiatal hernia, GERD-  ,  Endo/Other  Diabetes mellitus-, Type 2  Renal/GU negative Renal ROS     Musculoskeletal   Abdominal   Peds  Hematology negative hematology ROS (+)   Anesthesia Other Findings   Reproductive/Obstetrics                          Anesthesia Physical Anesthesia Plan  ASA: III  Anesthesia Plan: General   Post-op Pain Management:    Induction: Intravenous  Airway Management Planned: Oral ETT  Additional Equipment:   Intra-op Plan:   Post-operative Plan: Extubation in OR  Informed Consent: I have reviewed the patients History and Physical, chart, labs and discussed the procedure including the risks, benefits and alternatives for the proposed anesthesia with the patient or authorized representative who has indicated his/her understanding and acceptance.   Dental advisory given  Plan Discussed with: CRNA, Anesthesiologist and Surgeon  Anesthesia Plan Comments:         Anesthesia Quick Evaluation

## 2012-04-05 NOTE — Progress Notes (Signed)
Patient ID: Robyn Hill, female   DOB: [birth year], 66 y.o.   MRN: [id number]  Awake, eating lunch. Husband and daughter present. Good strength BLE. Only lumbar pain - controlled with PCA. No buttock or leg pain.   Georgiann Cocker, RN, BSN

## 2012-04-05 NOTE — Op Note (Signed)
04/05/2012  11:23 AM  PATIENT:  Robyn Hill  66 y.o. female  PRE-OPERATIVE DIAGNOSIS:  Spondylolisthesis, lumbar spinal stenosis, Lumbar degenerative disc disease, Lumbar radiculopathy L 4 /5  POST-OPERATIVE DIAGNOSIS:  Spondylolisthesis, lumbar spinal stenosis, Lumbar degenerative disc disease, Lumbar radiculopathy L 4 /5  PROCEDURE:  Procedure(s) (LRB): POSTERIOR LUMBAR Decompression, PLIF, Pedicle screw fixation, Posterolateral arthrodesis L 4 / 5FUSION 1 LEVEL (N/A)  SURGEON:  Surgeon(s) and Role:    * Maeola Harman, MD - Primary    * Temple Pacini, MD - Assisting  PHYSICIAN ASSISTANT:   ASSISTANTS: Poteat, RN   ANESTHESIA:   general  EBL:  Total I/O In: 1800 [I.V.:1800] Out: 350 [Urine:225; Blood:125]  BLOOD ADMINISTERED:none  DRAINS: (Medium ) Hemovact drain(s) in the Epidural space with  Suction Open   LOCAL MEDICATIONS USED:  LIDOCAINE   SPECIMEN:  No Specimen  DISPOSITION OF SPECIMEN:  N/A  COUNTS:  YES  TOURNIQUET:  * No tourniquets in log *  DICTATION: DICTATION: Patient is 66 year old woman with mobile spondylolisthesis of L4 on L5 with lumbar stenosis. She has a severe left L5 radiculopathy. It was elected to take her to surgery for decompression and fusion at this level.   Procedure: Patient was placed in a prone position on the [place] table after smooth and uncomplicated induction of general endotracheal anesthesia. Her low back was prepped and draped in usual sterile fashion with DuraPrep. Area of incision was infiltrated with local lidocaine. Incision was made to the lumbodorsal fascia was incised and exposure was performed of the L4 through L5 spinous processes laminae facet joint and transverse processes. Intraoperative x-ray was obtained which confirmed correct orientation. A total laminectomy of L4 was performed with disarticulation of the facet joints at this level and thorough decompression was performed of both L4 and L5 nerve roots along with the  common dural tube. This decompression was more involved than would be typical of that performed for PLIF alone and included painstaking dissection of adherent ligament compressing the thecal sac and wide decompression of all neural elements, including bilateral L4 and L5 nerve roots. A thorough discectomy was initially performed on the left with preparation of the endplates for grafting a trial spacer was placed this level and a thorough discectomy was performed on the right as well. Bone autograft was packed within the interspace bilaterally along with small BMP kit and NexOss bone graft extender. Bilateral median 9 mm peek cages were packed with BMP and extender and was inserted the interspace and countersunk appropriately along with 6 cc of morselized bone autograft. The posterolateral region was extensively decorticated and pedicle probes were placed at L4 and L5 bilaterally. Intraoperative fluoroscopy confirmed correct orientationin the AP and lateral plane. 40 x 6.5 mm pedicle screws were placed at L5 bilaterally and 45 x 6.5 mm screws placed at L4 bilaterally final x-rays demonstrated well-positioned interbody grafts and pedicle screw fixation. A 35 mm lordotic rod was placed on the right and a 40 mm rod was placed on the left locked down in situ and the posterolateral region was packed with the remaining BMP and bone graft extender on the left and BMP and autograft on the right. The wound was irrigated and a medium Hemovac drain was placed in the epidural space. Fascia was closed with 1 Vicryl sutures skin edges were reapproximated 2 and 3-0 Vicryl sutures. The wound is dressed with benzoin Steri-Strips Telfa gauze and tape the patient was extubated in the operating room and taken to recovery  in stable satisfactory condition. She tolerated the operation well counts were correct at the end of the case.   PLAN OF CARE: Admit to inpatient   PATIENT DISPOSITION:  PACU - hemodynamically stable.   Delay  start of Pharmacological VTE agent (>24hrs) due to surgical blood loss or risk of bleeding: yes

## 2012-04-05 NOTE — Transfer of Care (Signed)
Immediate Anesthesia Transfer of Care Note  Patient: Robyn Hill  Procedure(s) Performed: Procedure(s) (LRB): POSTERIOR LUMBAR FUSION 1 LEVEL (N/A)  Patient Location: PACU  Anesthesia Type: General  Level of Consciousness: awake, alert  and oriented  Airway & Oxygen Therapy: Patient Spontanous Breathing and Patient connected to nasal cannula oxygen  Post-op Assessment: Report given to PACU RN, Post -op Vital signs reviewed and stable and Patient moving all extremities X 4  Post vital signs: Reviewed and stable  Complications: No apparent anesthesia complications

## 2012-04-06 ENCOUNTER — Encounter (HOSPITAL_COMMUNITY): Payer: Self-pay | Admitting: General Practice

## 2012-04-06 LAB — GLUCOSE, CAPILLARY
Glucose-Capillary: 152 mg/dL — ABNORMAL HIGH (ref 70–99)
Glucose-Capillary: 114 mg/dL — ABNORMAL HIGH (ref 70–99)
Glucose-Capillary: 123 mg/dL — ABNORMAL HIGH (ref 70–99)
Glucose-Capillary: 168 mg/dL — ABNORMAL HIGH (ref 70–99)

## 2012-04-06 MED FILL — Sodium Chloride IV Soln 0.9%: INTRAVENOUS | Qty: 1000 | Status: AC

## 2012-04-06 MED FILL — Heparin Sodium (Porcine) Inj 1000 Unit/ML: INTRAMUSCULAR | Qty: 30 | Status: AC

## 2012-04-06 NOTE — Progress Notes (Signed)
Patient Robyn Hill. Mikaelian, 66 year old white females enjoys the spiritual support of her daughter and her husband who is a retired Education administrator person.  Patient thanked Orthoptist for providing pastoral presence, prayer, and conversation.  I will follow-up as needed.

## 2012-04-06 NOTE — Progress Notes (Signed)
Subjective: Patient reports sore in back  Objective: Vital signs in last 24 hours: Temp:  [97.7 F (36.5 C)-98.9 F (37.2 C)] 98.7 F (37.1 C) (06/19 0618) Pulse Rate:  [64-83] 83  (06/19 0618) Resp:  [12-20] 18  (06/19 0618) BP: (108-143)/(39-78) 110/39 mmHg (06/19 0618) SpO2:  [90 %-100 %] 99 % (06/19 0618) FiO2 (%):  [2 %] 2 % (06/18 1255)  Intake/Output from previous day: 06/18 0701 - 06/19 0700 In: 3661.3 [P.O.:680; I.V.:2981.3] Out: 98119 [Urine:22125; Drains:215; Blood:125] Intake/Output this shift:    Physical Exam: Full strength bilateral lower extremities.  No numbness.  Dressing CDI  Lab Results: No results found for this basename: WBC:2,HGB:2,HCT:2,PLT:2 in the last 72 hours BMET No results found for this basename: NA:2,K:2,CL:2,CO2:2,GLUCOSE:2,BUN:2,CREATININE:2,CALCIUM:2 in the last 72 hours  Studies/Results: Dg Lumbar Spine 2-3 Views  04/05/2012  *RADIOLOGY REPORT*  Clinical Data: L4-5 fusion.  OPERATIVE LUMBAR SPINE - 2-3 VIEW  Comparison: Lumbar spine MRI 08/07/2011.  Findings: 2 spot images from the C-arm fluoroscopic device were submitted for interpretation post-operatively, demonstrating bilateral pedicle screws at L4 and L5 and interbody fusion plugs in the L4-5 disc space.  IMPRESSION: L4-5 fusion.  Original Report Authenticated By: Arnell Sieving, M.D.   Dg Lumbar Spine 2-3 Views  04/05/2012  *RADIOLOGY REPORT*  Clinical Data: Posterior fusion.  LUMBAR SPINE - 2-3 VIEW  Comparison: MRI 08/07/2011  Findings: The first lateral intraoperative image demonstrates posterior surgical instruments extending from L2-3 to L4-5.  Second lateral intraoperative image demonstrates posterior surgical instruments posterior to the L3-4, L4-5 disk space levels and posterior to the L5 vertebral body.  IMPRESSION: Intraoperative localization as above.  Original Report Authenticated By: Cyndie Chime, M.D.    Assessment/Plan: Mobilize, DC PCA and foley    LOS: 1 day     Laurieanne Galloway D, MD 04/06/2012, 7:45 AM

## 2012-04-06 NOTE — Evaluation (Signed)
Physical Therapy Evaluation Patient Details Name: Robyn Hill MRN: [id number] DOB: January 17, 1946 Today's Date: 04/06/2012 Time: 4098-1191 PT Time Calculation (min): 25 min  PT Assessment / Plan / Recommendation Clinical Impression  Pt is 66 y/o female admitted for s/p PLIF L4/5.  Pt limited due to severe back pain.  Pt will benefit from acute PT services to improve overall mobility to prepare for safe d/c home.    PT Assessment  Patient needs continued PT services    Follow Up Recommendations  Home health PT    Barriers to Discharge        lEquipment Recommendations  Rolling walker with 5" wheels    Recommendations for Other Services     Frequency Min 5X/week    Precautions / Restrictions Precautions Precautions: Back Precaution Booklet Issued: Yes (comment) Precaution Comments: Handout given and educated on 3/3 back precautions Required Braces or Orthoses: Spinal Brace Spinal Brace: Lumbar corset;Applied in sitting position   Pertinent Vitals/Pain 7-8/10 back pain      Mobility  Bed Mobility Bed Mobility: Right Sidelying to Sit;Sitting - Scoot to Edge of Bed Right Sidelying to Sit: 3: Mod assist;With rails;HOB flat Sitting - Scoot to Edge of Bed: 3: Mod assist Details for Bed Mobility Assistance: (A) to elevate trunk OOB with max cues for technique.  (A) to scoot hips to EOB with use of pad. Transfers Transfers: Sit to Stand;Stand to Sit Sit to Stand: 3: Mod assist;From bed Stand to Sit: 4: Min assist;To chair/3-in-1 Details for Transfer Assistance: (A) to initiate transfer and slowly descend to chair with max cues for hand placement. Ambulation/Gait Ambulation/Gait Assistance: 4: Min assist Ambulation Distance (Feet): 5 Feet Assistive device: Rolling walker Ambulation/Gait Assistance Details: Limited due to pain and lightheadness.  Max cues for RW placement and step sequence.  Pt very guarded gait. Gait Pattern: Step-to pattern;Decreased stride  length;Shuffle;Decreased trunk rotation    Exercises     PT Diagnosis: Difficulty walking;Generalized weakness;Acute pain  PT Problem List: Decreased strength;Decreased activity tolerance;Decreased mobility;Decreased knowledge of use of DME;Decreased knowledge of precautions;Pain PT Treatment Interventions: DME instruction;Gait training;Stair training;Functional mobility training;Therapeutic activities;Therapeutic exercise;Balance training;Patient/family education   PT Goals Acute Rehab PT Goals PT Goal Formulation: With patient Time For Goal Achievement: 04/13/12 Potential to Achieve Goals: Good Pt will Roll Supine to Right Side: with modified independence PT Goal: Rolling Supine to Right Side - Progress: Goal set today Pt will go Supine/Side to Sit: with modified independence PT Goal: Supine/Side to Sit - Progress: Goal set today Pt will go Sit to Supine/Side: with modified independence PT Goal: Sit to Supine/Side - Progress: Goal set today Pt will go Sit to Stand: with modified independence PT Goal: Sit to Stand - Progress: Goal set today Pt will go Stand to Sit: with modified independence PT Goal: Stand to Sit - Progress: Goal set today Pt will Ambulate: >150 feet;with modified independence;with rolling walker PT Goal: Ambulate - Progress: Goal set today Pt will Go Up / Down Stairs: 1-2 stairs;with min assist;with least restrictive assistive device PT Goal: Up/Down Stairs - Progress: Goal set today Additional Goals Additional Goal #1: Pt will be able to recall and adhere to 3/3 back precautions. PT Goal: Additional Goal #1 - Progress: Goal set today  Visit Information  Last PT Received On: 04/06/12 Assistance Needed: +1 PT/OT Co-Evaluation/Treatment: Yes    Subjective Data  Subjective: "I'm hurting so bad.  I need to get off my side." Patient Stated Goal: To go home.   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 Communication Communication: No difficulties    Cognition  Overall Cognitive Status: Appears within functional limits for tasks assessed/performed Arousal/Alertness: Awake/alert Orientation Level: Appears intact for tasks assessed Behavior During Session: [facility] for tasks performed    Extremity/Trunk Assessment Right Lower Extremity Assessment RLE ROM/Strength/Tone: Due to pain;Unable to fully assess RLE Sensation: WFL - Proprioception;WFL - Light Touch Left Lower Extremity Assessment LLE ROM/Strength/Tone: Unable to fully assess;Due to pain LLE Sensation: WFL - Light Touch;WFL - Proprioception   Balance Balance Balance Assessed: Yes Static Sitting Balance Static Sitting - Balance Support: Feet supported Static Sitting - Level of Assistance: 5: Stand by assistance Static Sitting - Comment/# of Minutes: ~2 minutes; pt restless and ready to stand due to pain in sitting.  End of Session PT - End of Session Equipment Utilized During Treatment: Gait belt;Back brace Activity Tolerance: Patient limited by pain Patient left: in chair;with call bell/phone within reach Nurse Communication: Mobility status   Falon Huesca 04/06/2012, 11:01 AM Jake Shark, PT DPT [phone]

## 2012-04-06 NOTE — Progress Notes (Signed)
Occupational Therapy Evaluation Patient Details Name: Robyn Hill MRN: [id number] DOB: Oct 12, 1946 Today's Date: 04/06/2012 Time: 1191-4782 OT Time Calculation (min): 25 min  OT Assessment / Plan / Recommendation Clinical Impression  Pt s/p PLIF thus affecting PLOF. Will benefit from acute OT to address below problem list in prep for d/c home with spouse.    OT Assessment  Patient needs continued OT Services    Follow Up Recommendations  Home health OT;Supervision/Assistance - 24 hour    Barriers to Discharge      Equipment Recommendations  3 in 1 bedside comode (tub DME TBD)    Recommendations for Other Services    Frequency  Min 2X/week    Precautions / Restrictions Precautions Precautions: Back Precaution Booklet Issued: Yes (comment) Precaution Comments: Handout given and educated on 3/3 back precautions Required Braces or Orthoses: Spinal Brace Spinal Brace: Lumbar corset;Applied in sitting position   Pertinent Vitals/Pain See vitals    ADL  Upper Body Dressing: Performed;Set up Where Assessed - Upper Body Dressing: Unsupported sitting Lower Body Dressing: Performed;+1 Total assistance Where Assessed - Lower Body Dressing: Sopported sit to stand Toilet Transfer: Simulated;Moderate assistance Toilet Transfer Method:  (ambulating) Toilet Transfer Equipment:  (EOB to chair) Transfers/Ambulation Related to ADLs: min assist with RW ambulating from bed to chair, ADL Comments:  Donned back brace with mod assist sitting EOB. Limited ADL assessment due to pain.    OT Diagnosis: Acute pain  OT Problem List: Decreased activity tolerance;Pain;Decreased knowledge of precautions;Decreased knowledge of use of DME or AE OT Treatment Interventions: Self-care/ADL training;DME and/or AE instruction;Therapeutic activities;Patient/family education   OT Goals Acute Rehab OT Goals OT Goal Formulation: With patient Time For Goal Achievement: 04/13/12 Potential to Achieve Goals:  Good ADL Goals Pt Will Perform Lower Body Bathing: with supervision;Sit to stand from chair;Sit to stand from bed;with adaptive equipment ADL Goal: Lower Body Bathing - Progress: Goal set today Pt Will Perform Lower Body Dressing: with supervision;Sit to stand from chair;Sit to stand from bed;with adaptive equipment ADL Goal: Lower Body Dressing - Progress: Goal set today Pt Will Transfer to Toilet: with supervision;Ambulation;with DME;Comfort height toilet;Maintaining back safety precautions ADL Goal: Toilet Transfer - Progress: Goal set today Pt Will Perform Toileting - Clothing Manipulation: with supervision;Sitting on 3-in-1 or toilet;Standing;with adaptive equipment ADL Goal: Toileting - Clothing Manipulation - Progress: Goal set today Pt Will Perform Toileting - Hygiene: with supervision;Sit to stand from 3-in-1/toilet;Standing at 3-in-1/toilet;with adaptive equipment ADL Goal: Toileting - Hygiene - Progress: Goal set today Pt Will Perform Tub/Shower Transfer: Tub transfer;with min assist;Ambulation;with DME;Maintaining back safety precautions ADL Goal: Tub/Shower Transfer - Progress: Goal set today Additional ADL Goal #1: Pt will independently generalize 3/3 back precautions during all ADL activity. ADL Goal: Additional Goal #1 - Progress: Goal set today  Visit Information  Last OT Received On: 04/06/12 Assistance Needed: +1 PT/OT Co-Evaluation/Treatment: Yes    Subjective Data      Prior Functioning  Home Living Lives With: Spouse Available Help at Discharge: Family;Available 24 hours/day (husband) Type of Home: Mobile home Home Access: Stairs to enter Entrance Stairs-Number of Steps: 1 Entrance Stairs-Rails: None Home Layout: One level Bathroom Shower/Tub: Forensic scientist: Standard Bathroom Accessibility: Yes How Accessible: Accessible via walker Home Adaptive Equipment: Straight cane Prior Function Level of Independence: Independent Able to  Take Stairs?: Yes Driving: No (legally blind) Vocation: Retired Musician: No difficulties    Cognition  Overall Cognitive Status: Appears within functional limits for tasks assessed/performed Arousal/Alertness: Psychiatrist  Level: Appears intact for tasks assessed Behavior During Session: [facility] for tasks performed    Extremity/Trunk Assessment Right Upper Extremity Assessment RUE ROM/Strength/Tone: Within functional levels Left Upper Extremity Assessment LUE ROM/Strength/Tone: Within functional levels   Mobility Bed Mobility Bed Mobility: Right Sidelying to Sit;Sitting - Scoot to Edge of Bed Right Sidelying to Sit: 3: Mod assist;With rails;HOB flat Sitting - Scoot to Edge of Bed: 3: Mod assist Details for Bed Mobility Assistance: (A) to elevate trunk OOB with max cues for technique.  (A) to scoot hips to EOB with use of pad. Transfers Sit to Stand: 3: Mod assist;From bed Stand to Sit: 4: Min assist;To chair/3-in-1 Details for Transfer Assistance: (A) to initiate transfer and slowly descend to chair with max cues for hand placement.   Exercise    Balance Balance Balance Assessed: Yes Static Sitting Balance Static Sitting - Balance Support: Feet supported Static Sitting - Level of Assistance: 5: Stand by assistance Static Sitting - Comment/# of Minutes: ~2 minutes; pt restless and ready to stand due to pain in sitting.  End of Session OT - End of Session Equipment Utilized During Treatment: Gait belt;Back brace Activity Tolerance: Patient limited by pain Patient left: in chair;with call bell/phone within reach Nurse Communication: Mobility status  04/06/2012 Robyn Hill OTR/L Pager [phone] Office [phone]  Robyn Hill 04/06/2012, 2:17 PM

## 2012-04-06 NOTE — Clinical Social Work Note (Signed)
CSW received consult for SNF. PT is recommending home health PT. Discussed pt during progression. RNCM is aware and following. CSW is signing off as no further needs identified. Please reconsult if a need arises prior to discharge.   Dede Query, MSW, Theresia Majors [phone]

## 2012-04-07 LAB — GLUCOSE, CAPILLARY
Glucose-Capillary: 146 mg/dL — ABNORMAL HIGH (ref 70–99)
Glucose-Capillary: 95 mg/dL (ref 70–99)
Glucose-Capillary: 197 mg/dL — ABNORMAL HIGH (ref 70–99)
Glucose-Capillary: 78 mg/dL (ref 70–99)

## 2012-04-07 MED ORDER — HYDROMORPHONE HCL 2 MG PO TABS
2.0000 mg | ORAL_TABLET | ORAL | Status: DC | PRN
  Administered 2012-04-07 – 2012-04-09 (×8): 2 mg via ORAL
  Filled 2012-04-07 (×8): qty 1

## 2012-04-07 NOTE — Care Management Note (Unsigned)
    Page 1 of 1   04/07/2012     4:53:19 PM   CARE MANAGEMENT NOTE 04/07/2012  Patient:  Robyn Hill, Robyn Hill   Account Number:  [id number]  Date Initiated:  04/05/2012  Documentation initiated by:  [facility]  Subjective/Objective Assessment:   Admitted postop lumbar decompression, PLIF. Lives with spouse.     Action/Plan:   PT eval-recommending HHPT  OT eval-recommending HHOT   Anticipated DC Date:  04/08/2012   Anticipated DC Plan:  HOME W HOME HEALTH SERVICES      DC Planning Services  CM consult      Choice offered to / List presented to:  C-1 Patient   DME arranged  3-N-1  Levan Hurst      DME agency  [facility]        Status of service:  In process, will continue to follow Medicare Important Message given?   (If response is "NO", the following Medicare IM given date fields will be blank) Date Medicare IM given:   Date Additional Medicare IM given:    Discharge Disposition:    Per UR Regulation:  Reviewed for med. necessity/level of care/duration of stay  If discussed at Long Length of Stay Meetings, dates discussed:    Comments:  PCP Dr Sharlot Gowda  04/06/12 Spoke with patient about HHC. She chose [facility] from the list of [facility] agencies. Patient is agreeable to getting a rolling walker and a 3 in1. She lives with her husband who is very supportive and able to assist her. Will continue to follow to set up [facility] and equipment.  Jacquelynn Cree RN, BSN, CCM

## 2012-04-07 NOTE — Progress Notes (Signed)
Patient Robyn Hill. Buckles, 65 year old white female continues to feel upbeat about her recovery.  Patient is feeling bored with time in hospital, and is anxious to resume her familiar routine.  She perceives this experience as "a disruption" in her life.  However patient enjoys the support of her family (daughter and husband, and 4 other children).  Patient speaks fondly of her childhood in [place].  I will follow-up as needed.

## 2012-04-07 NOTE — Progress Notes (Signed)
Subjective: Patient reports doing better  Objective: Vital signs in last 24 hours: Temp:  [97.7 F (36.5 C)-98.9 F (37.2 C)] 98.9 F (37.2 C) (06/20 0211) Pulse Rate:  [78-88] 88  (06/20 0211) Resp:  [18] 18  (06/20 0211) BP: (101-132)/(61-81) 113/70 mmHg (06/20 0211) SpO2:  [94 %-100 %] 96 % (06/20 0211)  Intake/Output from previous day: 06/19 0701 - 06/20 0700 In: 1805 [P.O.:980; I.V.:825] Out: 1080 [Urine:950; Drains:130] Intake/Output this shift:    Physical Exam: Full strength, dressing CDI  Lab Results: No results found for this basename: WBC:2,HGB:2,HCT:2,PLT:2 in the last 72 hours BMET No results found for this basename: NA:2,K:2,CL:2,CO2:2,GLUCOSE:2,BUN:2,CREATININE:2,CALCIUM:2 in the last 72 hours  Studies/Results: Dg Lumbar Spine 2-3 Views  04/05/2012  *RADIOLOGY REPORT*  Clinical Data: L4-5 fusion.  OPERATIVE LUMBAR SPINE - 2-3 VIEW  Comparison: Lumbar spine MRI 08/07/2011.  Findings: 2 spot images from the C-arm fluoroscopic device were submitted for interpretation post-operatively, demonstrating bilateral pedicle screws at L4 and L5 and interbody fusion plugs in the L4-5 disc space.  IMPRESSION: L4-5 fusion.  Original Report Authenticated By: Arnell Sieving, M.D.   Dg Lumbar Spine 2-3 Views  04/05/2012  *RADIOLOGY REPORT*  Clinical Data: Posterior fusion.  LUMBAR SPINE - 2-3 VIEW  Comparison: MRI 08/07/2011  Findings: The first lateral intraoperative image demonstrates posterior surgical instruments extending from L2-3 to L4-5.  Second lateral intraoperative image demonstrates posterior surgical instruments posterior to the L3-4, L4-5 disk space levels and posterior to the L5 vertebral body.  IMPRESSION: Intraoperative localization as above.  Original Report Authenticated By: Cyndie Chime, M.D.    Assessment/Plan: DC drain.  Mobilize with PT    LOS: 2 days    Dorian Heckle, MD 04/07/2012, 7:20 AM

## 2012-04-07 NOTE — Progress Notes (Signed)
Physical Therapy Treatment Patient Details Name: Robyn Hill MRN: [id number] DOB: 07-Mar-1946 Today's Date: 04/07/2012 Time: 0802-0828 PT Time Calculation (min): 26 min  PT Assessment / Plan / Recommendation Comments on Treatment Session  Patient able to progress with ambulation this session. Patient also states that she ambulated in hall earlier this morning. Patient will need to attempt steps next session.     Follow Up Recommendations  Home health PT    Barriers to Discharge        Equipment Recommendations  Rolling walker with 5" wheels;3 in 1 bedside comode (tub DME TBD)    Recommendations for Other Services    Frequency Min 5X/week   Plan Discharge plan remains appropriate;Frequency remains appropriate    Precautions / Restrictions Precautions Precautions: Back Precaution Booklet Issued: Yes (comment) Precaution Comments: Patient recieved new handout and reeducated on all precautions. Patient able to verbalized 3/3 precautions. Continues to require cues to follow with mobility Required Braces or Orthoses: Spinal Brace Spinal Brace: Lumbar corset;Applied in sitting position   Pertinent Vitals/Pain 6/10 back pain    Mobility  Bed Mobility Bed Mobility: Rolling Right;Rolling Left;Sit to Sidelying Left Rolling Right: 4: Min assist Rolling Left: 4: Min assist Sit to Sidelying Left: 4: Min assist Details for Bed Mobility Assistance: Patient required cues for safest technique with sidelying. A for LEs back into bed. A for patient to roll holding on hand.  Transfers Sit to Stand: 4: Min assist;From chair/3-in-1;With upper extremity assist;With armrests Stand to Sit: 4: Min assist;To bed;With upper extremity assist Details for Transfer Assistance: Cues for safest technique and to control descent onto bed. Patient used technique with one hand on armrest one of RW.  Ambulation/Gait Ambulation/Gait Assistance: 4: Min guard Ambulation Distance (Feet): 160 Feet Assistive  device: Rolling walker Ambulation/Gait Assistance Details: Cues for safe positioning of RW and for upright posture.  Gait Pattern: Step-through pattern;Decreased stride length;Trunk flexed    Exercises     PT Diagnosis:    PT Problem List:   PT Treatment Interventions:     PT Goals Acute Rehab PT Goals PT Goal: Rolling Supine to Right Side - Progress: Progressing toward goal PT Goal: Sit to Supine/Side - Progress: Progressing toward goal PT Goal: Sit to Stand - Progress: Progressing toward goal PT Goal: Stand to Sit - Progress: Progressing toward goal PT Goal: Ambulate - Progress: Progressing toward goal Additional Goals PT Goal: Additional Goal #1 - Progress: Progressing toward goal  Visit Information  Last PT Received On: 04/07/12 Assistance Needed: +1    Subjective Data      Cognition  Overall Cognitive Status: Appears within functional limits for tasks assessed/performed Arousal/Alertness: Awake/alert Orientation Level: Appears intact for tasks assessed Behavior During Session: [facility] for tasks performed    Balance     End of Session PT - End of Session Equipment Utilized During Treatment: Gait belt;Back brace Activity Tolerance: Patient tolerated treatment well Patient left: in bed;with call bell/phone within reach Nurse Communication: Mobility status    Fredrich Birks 04/07/2012, 10:00 AM 04/07/2012 Fredrich Birks PTA [phone] pager [phone] office

## 2012-04-07 NOTE — Progress Notes (Signed)
Occupational Therapy Treatment Patient Details Name: Robyn Hill MRN: [id number] DOB: January 08, 1946 Today's Date: 04/07/2012 Time: 0107-0120 OT Time Calculation (min): 13 min  OT Assessment / Plan / Recommendation Comments on Treatment Session Treatment session focused on LB bathing/dressing techniques.  Will attempt tub transfer next session.    Follow Up Recommendations  Home health OT;Supervision/Assistance - 24 hour    Barriers to Discharge       Equipment Recommendations  3 in 1 bedside comode    Recommendations for Other Services    Frequency Min 2X/week   Plan Discharge plan remains appropriate    Precautions / Restrictions Precautions Precautions: Back Precaution Booklet Issued: Yes (comment) Precaution Comments: Pt able to recall 1/3 back precautions. Required Braces or Orthoses: Spinal Brace Spinal Brace: Lumbar corset;Applied in sitting position Restrictions Weight Bearing Restrictions: No   Pertinent Vitals/Pain See vitals    ADL  Lower Body Bathing: Simulated;Minimal assistance Where Assessed - Lower Body Bathing: Unsupported sitting Lower Body Dressing: Simulated;Minimal assistance Where Assessed - Lower Body Dressing: Unsupported sitting Equipment Used: Reacher;Long-handled sponge Transfers/Ambulation Related to ADLs: Pt refusing OOB mobility at this time.  ADL Comments: Pt in bed upon OT arrival.  Max encouragement to practice tub transfer and toilet transfer with therapist, but pt refusing.  Therapy session focused on educating pt on proper technique for LB bathing and dressing with use of AE.  Also educated pt on using 3n1 as a shower seat in the tub.      OT Diagnosis:    OT Problem List:   OT Treatment Interventions:     OT Goals ADL Goals Pt Will Perform Lower Body Bathing: with supervision;Sit to stand from chair;Sit to stand from bed;with adaptive equipment ADL Goal: Lower Body Bathing - Progress: Progressing toward goals Pt Will Perform Lower  Body Dressing: with supervision;Sit to stand from chair;Sit to stand from bed;with adaptive equipment ADL Goal: Lower Body Dressing - Progress: Progressing toward goals Additional ADL Goal #1: Pt will independently generalize 3/3 back precautions during all ADL activity. ADL Goal: Additional Goal #1 - Progress: Progressing toward goals  Visit Information  Last OT Received On: 04/07/12 Assistance Needed: +1    Subjective Data      Prior Functioning       Cognition  Overall Cognitive Status: Appears within functional limits for tasks assessed/performed Arousal/Alertness: Awake/alert Orientation Level: Appears intact for tasks assessed Behavior During Session: [facility] for tasks performed    Mobility Bed Mobility Bed Mobility: Rolling Right;Right Sidelying to Sit;Sit to Sidelying Right Rolling Right: 4: Min assist Rolling Left: 4: Min assist Right Sidelying to Sit: 4: Min assist Sit to Sidelying Right: 4: Min assist Sit to Sidelying Left: 4: Min assist Details for Bed Mobility Assistance: VC for correct technique. Assist to LEs in/out of bed. Transfers Sit to Stand: 4: Min assist;From chair/3-in-1;With upper extremity assist;With armrests Stand to Sit: 4: Min assist;To bed;With upper extremity assist Details for Transfer Assistance: Cues for safest technique and to control descent onto bed. Patient used technique with one hand on armrest one of RW.    Exercises    Balance    End of Session OT - End of Session Activity Tolerance: Patient limited by fatigue;Patient limited by pain Patient left: in bed;with call bell/phone within reach;with bed alarm set  04/07/2012 Cipriano Mile OTR/L Pager [phone] Office [phone]  Cipriano Mile 04/07/2012, 1:39 PM

## 2012-04-08 LAB — GLUCOSE, CAPILLARY
Glucose-Capillary: 106 mg/dL — ABNORMAL HIGH (ref 70–99)
Glucose-Capillary: 255 mg/dL — ABNORMAL HIGH (ref 70–99)
Glucose-Capillary: 125 mg/dL — ABNORMAL HIGH (ref 70–99)
Glucose-Capillary: 116 mg/dL — ABNORMAL HIGH (ref 70–99)

## 2012-04-08 MED ORDER — FLEET ENEMA 7-19 GM/118ML RE ENEM
1.0000 | ENEMA | Freq: Every day | RECTAL | Status: DC | PRN
  Administered 2012-04-08: 09:00:00 via RECTAL
  Filled 2012-04-08: qty 1

## 2012-04-08 NOTE — Progress Notes (Signed)
Subjective: Patient reports "I really feel the worst I've felt. My bowels haven't moved."  Objective: Vital signs in last 24 hours: Temp:  [97.9 F (36.6 C)-98.8 F (37.1 C)] 98.4 F (36.9 C) (06/21 0553) Pulse Rate:  [82-89] 86  (06/21 0553) Resp:  [18-20] 18  (06/21 0553) BP: (107-150)/(65-82) 117/70 mmHg (06/21 0553) SpO2:  [84 %-98 %] 96 % (06/21 0553) Weight:  [97.523 kg (215 lb)] 97.523 kg (215 lb) (06/20 1100)  Intake/Output from previous day: 06/20 0701 - 06/21 0700 In: 360 [P.O.:360] Out: 700 [Urine:700] Intake/Output this shift:    Sitting in chair crying. Lumbar and right hip/thigh pain. Good strength BLE. Incision without erythema, swelling, or drainage. Drsg dry. BS actx4, nontender abdomen. (Dilaudid & Valium within past 2hrs.)  Lab Results: No results found for this basename: WBC:2,HGB:2,HCT:2,PLT:2 in the last 72 hours BMET No results found for this basename: NA:2,K:2,CL:2,CO2:2,GLUCOSE:2,BUN:2,CREATININE:2,CALCIUM:2 in the last 72 hours  Studies/Results: No results found.  Assessment/Plan: Increased pain this am ?complicated by constipation  LOS: 3 days  Per Dr. Venetia Maxon, Fleets enema this am.    Georgiann Cocker 04/08/2012, 8:00 AM    Agree with above

## 2012-04-08 NOTE — Progress Notes (Signed)
Occupational Therapy Treatment Patient Details Name: Robyn Hill MRN: [id number] DOB: 07-Dec-1945 Today's Date: 04/08/2012 Time: 7829-5621 OT Time Calculation (min): 11 min  OT Assessment / Plan / Recommendation Comments on Treatment Session Pt progressing towards goals.  Increased indpendence with functional mobility.  Pt reporting she feels much better this afternoon.    Follow Up Recommendations  Home health OT;Supervision/Assistance - 24 hour    Barriers to Discharge       Equipment Recommendations  3 in 1 bedside comode    Recommendations for Other Services    Frequency Min 2X/week   Plan Discharge plan remains appropriate    Precautions / Restrictions Precautions Precautions: Back Precaution Booklet Issued: Yes (comment) Precaution Comments: Pt able to independently recall 3/3 back precautions. Required Braces or Orthoses: Spinal Brace Spinal Brace: Applied in sitting position Restrictions Weight Bearing Restrictions: No   Pertinent Vitals/Pain See vitals    ADL  Grooming: Performed;Wash/dry hands;Supervision/safety Where Assessed - Grooming: Unsupported standing Toilet Transfer: Simulated;Min Pension scheme manager Method:  (ambulating) Acupuncturist:  (chair) Equipment Used: Rolling walker;Back brace Transfers/Ambulation Related to ADLs: Pt ambulated throughout room with supervision and RW ADL Comments: Pt able to independently recall and verbalize proper technique for LB bathing /dressing.  Educated pt on use of toilet aid to increase independence with toileting hygiene while maintaining back precautions. During education, pt often comments "I will just have my husband do it for me"    OT Diagnosis:    OT Problem List:   OT Treatment Interventions:     OT Goals ADL Goals Pt Will Transfer to Toilet: with supervision;Ambulation;with DME;Comfort height toilet;Maintaining back safety precautions ADL Goal: Toilet Transfer - Progress: Progressing  toward goals Additional ADL Goal #1: Pt will independently generalize 3/3 back precautions during all ADL activity. ADL Goal: Additional Goal #1 - Progress: Progressing toward goals  Visit Information  Last OT Received On: 04/08/12    Subjective Data      Prior Functioning       Cognition  Overall Cognitive Status: Appears within functional limits for tasks assessed/performed Arousal/Alertness: Awake/alert Orientation Level: Appears intact for tasks assessed Behavior During Session: [facility] for tasks performed    Mobility Transfers Transfers: Stand to Sit Stand to Sit: 4: Min guard;To chair/3-in-1;With armrests;With upper extremity assist Details for Transfer Assistance: Pt ambulating in hallway with nurse tech upon OT arrival.  Pt demonstrated good technique and safe hand placement when returning to chair.   Exercises    Balance    End of Session OT - End of Session Equipment Utilized During Treatment: Back brace Activity Tolerance: Patient tolerated treatment well Patient left: in chair;with call bell/phone within reach Nurse Communication: Mobility status  04/08/2012 Cipriano Mile OTR/L Pager [phone] Office [phone]  Cipriano Mile 04/08/2012, 3:21 PM

## 2012-04-08 NOTE — Progress Notes (Signed)
04/08/2012 Robyn Hill Elizabeth PTA [phone] pager [phone] office    

## 2012-04-08 NOTE — Progress Notes (Signed)
OT Cancellation Note  Treatment cancelled today due to patient's refusal to participate. Pt finishing session with PT with c/o 10/10 pain.  Will re-attempt later today as time allows.  04/08/2012 Cipriano Mile OTR/L Pager [phone] Office [phone]

## 2012-04-08 NOTE — Progress Notes (Signed)
Physical Therapy Treatment Patient Details Name: Robyn Hill MRN: [id number] DOB: 11-19-1945 Today's Date: 04/08/2012 Time: 4010-2725 PT Time Calculation (min): 18 min  PT Assessment / Plan / Recommendation Comments on Treatment Session  Pt was limited by pain this session due to constipation and abdominal pain/pressure.  If patient's pain decreases, next treatment can attempt further ambulation and stairs.     Follow Up Recommendations  Home health PT    Barriers to Discharge        Equipment Recommendations  3 in 1 bedside comode    Recommendations for Other Services    Frequency Min 5X/week   Plan Discharge plan remains appropriate    Precautions / Restrictions Precautions Precautions: Back Required Braces or Orthoses: Spinal Brace Spinal Brace: Applied in sitting position Restrictions Weight Bearing Restrictions: No   Pertinent Vitals/Pain 1010 back pain. RN notified    Mobility  Transfers Sit to Stand: 2: Max assist;With upper extremity assist;From chair/3-in-1 Stand to Sit: 3: Mod assist;With upper extremity assist;To chair/3-in-1 Details for Transfer Assistance: Cues for safest technique. Patient used technique with one hand on armrest one of RW.  Pt limited by pain.  Ambulation/Gait Ambulation/Gait Assistance: 3: Mod assist Ambulation Distance (Feet): 10 Feet Assistive device: Rolling walker Ambulation/Gait Assistance Details: Pt only able to walk to door and back.  VC needed to initiate steps,  A for safe positioning of RW. Gait Pattern: Step-through pattern;Decreased stride length;Trunk flexed Gait velocity: slow    Exercises     PT Diagnosis:    PT Problem List:   PT Treatment Interventions:     PT Goals Acute Rehab PT Goals PT Goal: Sit to Stand - Progress: Not progressing PT Goal: Stand to Sit - Progress: Not progressing PT Goal: Ambulate - Progress: Not progressing  Visit Information  Last PT Received On: 04/08/12 Assistance Needed: +1      Subjective Data      Cognition  Overall Cognitive Status: Appears within functional limits for tasks assessed/performed Arousal/Alertness: Lethargic Orientation Level: Appears intact for tasks assessed Behavior During Session: Lethargic    Balance     End of Session PT - End of Session Equipment Utilized During Treatment: Gait belt Activity Tolerance: Patient limited by pain Patient left: with call bell/phone within reach;in chair Nurse Communication: Mobility status    Robyn Hill,SPTA 04/08/2012, 9:29 AM

## 2012-04-09 LAB — GLUCOSE, CAPILLARY
Glucose-Capillary: 154 mg/dL — ABNORMAL HIGH (ref 70–99)
Glucose-Capillary: 110 mg/dL — ABNORMAL HIGH (ref 70–99)
Glucose-Capillary: 147 mg/dL — ABNORMAL HIGH (ref 70–99)

## 2012-04-09 MED ORDER — DIAZEPAM 5 MG PO TABS: 5.0000 mg | ORAL_TABLET | Freq: Four times a day (QID) | ORAL | Status: AC | PRN

## 2012-04-09 MED ORDER — OXYCODONE-ACETAMINOPHEN 5-325 MG PO TABS: 1.0000 | ORAL_TABLET | ORAL | Status: AC | PRN

## 2012-04-09 NOTE — Discharge Summary (Signed)
Physician Discharge Summary  Patient ID: Robyn Hill MRN: [id number] DOB/AGE: Mar 30, 1946 66 y.o.  Admit date: 04/05/2012 Discharge date: 04/09/2012  Admission Diagnoses: Lumbar spondylolisthesis, lumbar stenosis, Lumbar spondylosis, lumbar degenerative disc disease  Discharge Diagnoses: Lumbar spondylolisthesis, lumbar stenosis, Lumbar spondylosis, lumbar degenerative disc disease  Discharged Condition: good  Hospital Course: Patient was admitted by Dr. Maeola Harman. He performed day L4-5 lumbar decompression and fusion. Patient was seen in consultation by PT and OT, and has made gradual progress. She reports that she had some constipation that responded to a fleets enema yesterday, and has asked for another fleets enema today prior to discharge. Patient is being asked to be discharged to home. She has been given instructions regarding wound care and activities. The wound is healing nicely, it is open to air. We've requested both a 3 in 1 as well as a rolling walker for the patient's use at home. She is to return for followup with Dr. Venetia Maxon in about 2-3 weeks.  Discharge Exam: Blood pressure 94/51, pulse 81, temperature 98.3 F (36.8 C), temperature source Oral, resp. rate 20, height 5\' 5"  (1.651 m), weight 97.523 kg (215 lb), SpO2 97.00%.  Disposition: Home  Discharge Orders    Future Appointments: Provider: Department: Dept Phone: Center:   04/25/2012 8:15 AM Jac Canavan, PA [facility] [phone] [facility]     Medication List  As of 04/09/2012  8:27 AM   TAKE these medications         ACCU-CHEK COMPACT STRIPS test strip   Generic drug: glucose blood   TEST ONCE DAILY      atorvastatin 40 MG tablet   Commonly known as: LIPITOR   take 1 tablet once daily      brimonidine 0.1 % Soln   Commonly known as: ALPHAGAN P   Place 1 drop into the right eye every 12 (twelve) hours.      cholecalciferol 1000 UNITS tablet   Commonly known as: VITAMIN D   Take 1,000 Units by  mouth daily.      colestipol 1 G tablet   Commonly known as: COLESTID   Take 4 g by mouth daily as needed. 1 tablet po QID      DEXILANT 60 MG capsule   Generic drug: dexlansoprazole   Take 60 mg by mouth 2 (two) times daily.      diazepam 5 MG tablet   Commonly known as: VALIUM   Take 1 tablet (5 mg total) by mouth every 6 (six) hours as needed (muscle spasm).      dorzolamide-timolol 22.3-6.8 MG/ML ophthalmic solution   Commonly known as: COSOPT   Place 1 drop into the right eye every 12 (twelve) hours.      DULoxetine 60 MG capsule   Commonly known as: CYMBALTA   Take 1 capsule (60 mg total) by mouth 2 (two) times daily.      enalapril 20 MG tablet   Commonly known as: VASOTEC   take 1 tablet by mouth once daily      hydrochlorothiazide 25 MG tablet   Commonly known as: HYDRODIURIL   take 1 tablet by mouth once daily      latanoprost 0.005 % ophthalmic solution   Commonly known as: XALATAN   Place 1 drop into the right eye at bedtime.      meloxicam 7.5 MG tablet   Commonly known as: MOBIC   Take 7.5 mg by mouth 2 (two) times daily.  oxyCODONE-acetaminophen 5-325 MG per tablet   Commonly known as: PERCOCET   Take 1-2 tablets by mouth every 4 (four) hours as needed for pain.      oxyCODONE-acetaminophen 5-325 MG per tablet   Commonly known as: PERCOCET   Take 1-2 tablets by mouth every 6 (six) hours as needed. For pain      sitaGLIPtan-metformin 50-1000 MG per tablet   Commonly known as: JANUMET   Take 1 tablet by mouth 2 (two) times daily with a meal.             Signed: Hewitt Shorts, MD 04/09/2012, 8:27 AM

## 2012-04-09 NOTE — Progress Notes (Signed)
   CARE MANAGEMENT NOTE 04/09/2012  Patient:  Robyn Hill, Robyn Hill   Account Number:  [id number]  Date Initiated:  04/05/2012  Documentation initiated by:  [facility]  Subjective/Objective Assessment:   Admitted postop lumbar decompression, PLIF. Lives with spouse.     Action/Plan:   PT eval-recommending HHPT  OT eval-recommending HHOT   Anticipated DC Date:  04/08/2012   Anticipated DC Plan:  HOME W HOME HEALTH SERVICES      DC Planning Services  CM consult      [facility] Choice  HOME HEALTH   Choice offered to / List presented to:  C-1 Patient   DME arranged  3-N-1  Levan Hurst      DME agency  [facility]        Status of service:  Completed, signed off Medicare Important Message given?   (If response is "NO", the following Medicare IM given date fields will be blank) Date Medicare IM given:   Date Additional Medicare IM given:    Discharge Disposition:  HOME/SELF CARE  Per UR Regulation:  Reviewed for med. necessity/level of care/duration of stay  If discussed at Long Length of Stay Meetings, dates discussed:    Comments:  04/09/2012 1039 Spoke to pt and states does not have DME. Contacted [facility] for DME for scheduled d/c today. Pt states d/c MD states no HH at this time. Pt scheduled to follow up with Dr. Venetia Maxon in 2-3 weeks per d/c summary. Isidoro Donning RN CCM Case Mgmt phone [phone]  PCP Dr Sharlot Gowda  04/06/12 Spoke with patient about HHC. She chose [facility] from the list of [facility] agencies. Patient is agreeable to getting a rolling walker and a 3 in1. She lives with her husband who is very supportive and able to assist her. Will continue to follow to set up [facility] and equipment.  Jacquelynn Cree RN, BSN, CCM

## 2012-04-09 NOTE — Progress Notes (Signed)
Physical Therapy Treatment Patient Details Name: Robyn Hill MRN: [id number] DOB: Jun 21, 1946 Today's Date: 04/09/2012 Time: 4098-1191 PT Time Calculation (min): 19 min  PT Assessment / Plan / Recommendation Comments on Treatment Session  Pt progressing with mobility & did well today.  Pt's daughter present entire session.  Pt did very well with stairs.      Follow Up Recommendations  Home health PT    Barriers to Discharge        Equipment Recommendations  3 in 1 bedside comode    Recommendations for Other Services    Frequency Min 5X/week   Plan Discharge plan remains appropriate    Precautions / Restrictions Precautions Precautions: Back Precaution Booklet Issued: Yes (comment) Precaution Comments: Pt able to independently recall 3/3 back precautions. Required Braces or Orthoses: Spinal Brace Spinal Brace: Applied in sitting position Restrictions Weight Bearing Restrictions: No    Pertinent Vitals/Pain 4/10 back.   Medicated ~1 hr prior to PT session per pt.      Mobility  Bed Mobility Bed Mobility: Right Sidelying to Sit Right Sidelying to Sit: 4: Min guard Details for Bed Mobility Assistance: Cues to reinforce back precautions.  Slow to achieve sitting upright but able to perform without physical (A) from clinician.   Transfers Transfers: Sit to Stand;Stand to Sit Sit to Stand: 4: Min assist;Without upper extremity assist;From bed Stand to Sit: 5: Supervision;With upper extremity assist;To toilet Details for Transfer Assistance: Cues for hand placement & reinforcement of back precautions.    Ambulation/Gait Ambulation/Gait Assistance: 4: Min guard Ambulation Distance (Feet): 100 Feet Assistive device: Rolling walker Ambulation/Gait Assistance Details: cues for tall posture.  Pt's daughter present entire distance.  Gait Pattern: Step-through pattern;Decreased stride length;Trunk flexed Stairs: Yes Stairs Assistance: 4: Min guard Stair Management Technique:  Two rails;Alternating pattern;Forwards Number of Stairs: 5  Wheelchair Mobility Wheelchair Mobility: No    Exercises     PT Diagnosis:    PT Problem List:   PT Treatment Interventions:     PT Goals Acute Rehab PT Goals Time For Goal Achievement: 04/13/12 PT Goal: Rolling Supine to Right Side - Progress: Progressing toward goal PT Goal: Sit to Stand - Progress: Progressing toward goal PT Goal: Stand to Sit - Progress: Progressing toward goal PT Goal: Ambulate - Progress: Progressing toward goal PT Goal: Up/Down Stairs - Progress: Met Additional Goals PT Goal: Additional Goal #1 - Progress: Progressing toward goal  Visit Information  Last PT Received On: 04/09/12 Assistance Needed: +1    Subjective Data      Cognition  Overall Cognitive Status: Appears within functional limits for tasks assessed/performed Arousal/Alertness: Awake/alert Orientation Level: Appears intact for tasks assessed Behavior During Session: [facility] for tasks performed    Balance  Balance Balance Assessed: No  End of Session PT - End of Session Equipment Utilized During Treatment: Gait belt;Back brace Activity Tolerance: Patient tolerated treatment well Patient left: Other (comment) (in bathroom with daughter present) Nurse Communication: Mobility status    Lara Mulch 04/09/2012, 2:29 PM  Verdell Face, [place] [phone] 04/09/2012

## 2012-04-09 NOTE — Progress Notes (Signed)
Pt. D/C'd to home with daughter. Discharge teaching completed with pt. And pt.'s daughter ;complete understanding of information discussed voiced by pt. And pt.'s daughter.

## 2012-04-13 ENCOUNTER — Telehealth: Payer: Self-pay | Admitting: Family Medicine

## 2012-04-13 ENCOUNTER — Other Ambulatory Visit: Payer: Self-pay | Admitting: Medical

## 2012-04-13 MED ORDER — FLUCONAZOLE 100 MG PO TABS: 100.0000 mg | ORAL_TABLET | Freq: Every day | ORAL | Status: DC

## 2012-04-13 NOTE — Telephone Encounter (Signed)
PATIENT WAS NOTIFIED OF HER MEDICATION THAT WAS SENT TO THE PHARMACY. CLS

## 2012-04-13 NOTE — Telephone Encounter (Signed)
PATIENT CALLED AND SAID SHE JUST GOT HOME FROM BACK SURGERY AND SHE IS HAVING A PROBLEM WITH THRUSH ON HER TONGUE AND IT IS PAINFUL. SHE CALLED OVER TO DR. Rush Farmer OFFICE AND THEY CALLED HER OUT SOME MOUTH WASH THE PHARMACY DOESN'T HAVE AND HER INSURANCE WILL NOT COVER THAT. SHE CALLED DR. STERNS OFFICE BACK TO LET THEM KNOW SO THEY COULD CALL SOMETHING ELSE OUT FOR HER AND THEY SAID THAT IS THE ONLY MEDICINE THAT THEY CALL OUT FOR THAT. WELL, SHE WOULD LIKE TO KNOW IS THERE ANYTHING ELSE SHE CAN USE FOR THRUSH AND CAN YOU SEND TO HER PHARMACY BECAUSE SHE IS IN PAIN. CLS

## 2012-04-13 NOTE — Telephone Encounter (Signed)
i sent diflucan talbets, once daily x 7 days

## 2012-04-14 ENCOUNTER — Other Ambulatory Visit: Payer: Self-pay

## 2012-04-14 MED ORDER — FLUCONAZOLE 100 MG PO TABS: 100.0000 mg | ORAL_TABLET | Freq: Every day | ORAL | Status: AC

## 2012-04-14 NOTE — Telephone Encounter (Signed)
This is ok by [employer]

## 2012-04-18 ENCOUNTER — Telehealth: Payer: Self-pay | Admitting: Family Medicine

## 2012-04-18 ENCOUNTER — Other Ambulatory Visit: Payer: Self-pay | Admitting: Medical

## 2012-04-18 NOTE — Telephone Encounter (Signed)
Patient states that Dr. Fredrich Birks office gave her Hydrocodone for a month and half to carry her until she had her surgery. She states that the discharge doctor gave her only for 5 days and Valium. She states that she still has the Valium but she is not using that. She states that you gave her Oxycodone 5/325 mg and she states that she needs this. She said her pain is at a six when trying to get up but on regular it is at a 5. She said that the surgery doctor said it would take 4 to 6 weeks before she would know if the surgery work or not. CLS

## 2012-04-18 NOTE — Telephone Encounter (Signed)
Call and verify what she is taking.  In the past we had been using Hydrocodone, but she ended up having surgery recently.  So, 1) does she feel like she needs pain medication currently?  2) is the neurosurgeon prescribing pain medication for her?  3) how is her pain now that she has had the surgery?  Karren Burly  - if she feels the need to c/t pain medication, find out how often she is taking the medication.

## 2012-04-19 NOTE — Telephone Encounter (Signed)
I called and spoke to Dr. Fredrich Birks nurse.  They will contact Dr. Venetia Maxon to call back regarding opinion on pain medication.  Of note, she has been on chronic Lortab 5/500 3-4 daily, but since surgery of lumbar spine recently, I wanted to touch base with Dr. Venetia Maxon on how to manage her pain medication going forward.  The goal would be to come off some of the medication.  I will await call back from Dr. Venetia Maxon.

## 2012-04-19 NOTE — Telephone Encounter (Signed)
Patient was notified that Dr. Rush Farmer office will take over her pain management as now and to give them a call. CLS

## 2012-04-19 NOTE — Telephone Encounter (Signed)
I spoke with Dr. Fredrich Birks office.  With the recent surgery, the goal is that the surgery will help to improve her pain and potentially be able to come off pain medication or at least step down from narcotic pain medication.   Since neurosurgery is managing the pain currently, they prefer to deal with pain medication refills at this time.  They asked me to have her call them for refill request on pain medication.     Thus, ask her to call over to Dr. Fredrich Birks office for pain medication refill, and I believe she has a f/u appt in July with them.

## 2012-04-25 ENCOUNTER — Ambulatory Visit: Payer: Medicare Other | Admitting: Medical

## 2012-04-27 ENCOUNTER — Encounter: Payer: Self-pay | Admitting: Medical

## 2012-04-27 ENCOUNTER — Ambulatory Visit (INDEPENDENT_AMBULATORY_CARE_PROVIDER_SITE_OTHER): Payer: Medicare Other | Admitting: Medical

## 2012-04-27 VITALS — BP 138/80 | HR 76 | Temp 98.0°F | Resp 16 | Wt 208.0 lb

## 2012-04-27 DIAGNOSIS — E785 Hyperlipidemia, unspecified: Secondary | ICD-10-CM | POA: Diagnosis not present

## 2012-04-27 DIAGNOSIS — I10 Essential (primary) hypertension: Secondary | ICD-10-CM | POA: Diagnosis not present

## 2012-04-27 DIAGNOSIS — M4310 Spondylolisthesis, site unspecified: Secondary | ICD-10-CM | POA: Diagnosis not present

## 2012-04-27 DIAGNOSIS — M542 Cervicalgia: Secondary | ICD-10-CM

## 2012-04-27 DIAGNOSIS — E119 Type 2 diabetes mellitus without complications: Secondary | ICD-10-CM

## 2012-04-27 DIAGNOSIS — M549 Dorsalgia, unspecified: Secondary | ICD-10-CM

## 2012-04-27 LAB — POCT GLYCOSYLATED HEMOGLOBIN (HGB A1C): Hemoglobin A1C: 6.8

## 2012-04-27 NOTE — Progress Notes (Signed)
Subjective: Here for f/u.  She recently had lumbar back surgery, Dr. Venetia Maxon at [facility].  Here with husband today.  She notes that during the hospitalization she and husband were very upset with the care during the stay.  They note multiple times have to ask multiple times for water, for help transferring to rest room, lack of quick response when pt was on the toilet.  This was all despite repeated requests to the nurses station.  They felt there weren't being unreasonable but rather there was no urgency or motivation by CNA staff and nurses station personnel.  Most of the nurses were helpful though.  She felt that the limited PT towards the end of the hospital stay was merely 1 visit by PT for a brief walk down the hall.  Since she has been home, her husband has really been good and taking care of the house chores, keeping her surgery site clean, helping with her ADLs.  She has started doing walking and exercises her self to try and recuperate back to normal as quick as possible.  She has had pain though.  Sees Dr. Venetia Maxon today in f/u.   She did have constipation in the hospital which was ultimately relieved with enema.    She does c/o neck pain and some pain with swallowing, mostly right sided.  This pain is similar to the pain that prompted thyroid ultrasound last year.   ultimately ENT felt that her pain was more to due with cervical spine issues rather than thyroid nodule.   Here for chronic disease f/u - diabetes, hypertension, hyperlipidemia, and others.  She is eating healthy, compliant with medications, fasting.  Not checking glucose daily as her numbers have been under 130 in the mornings.  No other c/o.  Just wants help dealing with the pain and getting back to normal.   Objective: Gen: wd, wn, nad Skin: lumbar vertical surgical scar healing approprietly.  No erythema or drainage. Neck: nontender, no mass, no lymphadenopathy, supple Heart: RRR, normal S1, S2, no murmur Lungs:  CTA Pulses: 2+ Ext : no edema.  Assessment: Encounter Diagnoses  Name Primary?  . Type II or unspecified type diabetes mellitus without mention of complication, not stated as uncontrolled Yes  . Essential hypertension, benign   . Hyperlipidemia   . Neck pain   . Back pain      Plan: HgbA1C reviewed today.  C/t same meds, healthy diet. Congratulated her on some weight loss  HTN - controled on current medication.  Hyperlipidemia - reviewed last lipids.  C/t same medication.  Neck pain - likely musculoskeletal instead of sof tissue or thyroid related.  Reviewed last MRI of cervical spine in chart showing bulging discs.  discussed signs of stenosis or radiculopathy that would prompt more aggressive management.  She will discuss with Dr. Venetia Maxon today.   Back pain - f/u with Dr. Venetia Maxon today regarding pain medications, pain control and surgery f/u.

## 2012-05-06 ENCOUNTER — Telehealth: Payer: Self-pay | Admitting: Family Medicine

## 2012-05-06 NOTE — Telephone Encounter (Signed)
Nothing was said about her neck pain. He only said that looking at her x-rays that everything is screwed up. She said that her pain is not all the time and she never knows when it is going to come. She said she is not really worried about the pain. She is not sure when the pain is going to happen. Its like a paralizing feeling and she never knows how long it is going to last. She said there will be no therapy. Her pain medication is Hydrocodone and she states she will follow up with him in 6  Months. CLS

## 2012-05-06 NOTE — Telephone Encounter (Signed)
Message copied by Janeice Robinson on Fri May 06, 2012  1:39 PM ------      Message from: Aleen Campi, Kermit Balo      Created: Fri May 06, 2012  8:06 AM       i got back f/u note from Dr. Venetia Maxon, but it doesn't say much in terms of the concerns she raised with me the other day.  What did Dr. Venetia Maxon and her discuss about her neck pain and pain level in general. What was the plan in terms of her pain medication/therapy?

## 2012-05-09 ENCOUNTER — Encounter: Payer: Self-pay | Admitting: Medical

## 2012-05-09 DIAGNOSIS — H40159 Residual stage of open-angle glaucoma, unspecified eye: Secondary | ICD-10-CM | POA: Diagnosis not present

## 2012-05-10 ENCOUNTER — Telehealth: Payer: Self-pay | Admitting: Family Medicine

## 2012-05-10 NOTE — Telephone Encounter (Signed)
Patient states that she has had a recent mammogram on 07/24/2011 @ the [facility]. CLS

## 2012-05-10 NOTE — Telephone Encounter (Signed)
Message copied by Janeice Robinson on Tue May 10, 2012  2:47 PM ------      Message from: Jac Canavan      Created: Mon May 09, 2012  1:34 PM       i reviewed over her chart.  The only outstanding items for preventative care are mammogram.   When was her last mammogram?  i'm showing 2011 unless I didn't readily see a more recent one in the computer such as if it was done at [facility].

## 2012-05-11 NOTE — Telephone Encounter (Signed)
Faxed over request to [facility]

## 2012-05-11 NOTE — Telephone Encounter (Signed)
pls call and get copy of mammogram from [facility] in [place].  She had mammogram last fall there.

## 2012-06-06 DIAGNOSIS — M4310 Spondylolisthesis, site unspecified: Secondary | ICD-10-CM | POA: Diagnosis not present

## 2012-06-13 DIAGNOSIS — H40159 Residual stage of open-angle glaucoma, unspecified eye: Secondary | ICD-10-CM | POA: Diagnosis not present

## 2012-06-18 ENCOUNTER — Other Ambulatory Visit: Payer: Self-pay | Admitting: Medical

## 2012-06-21 NOTE — Telephone Encounter (Signed)
RX refill

## 2012-07-07 ENCOUNTER — Telehealth: Payer: Self-pay | Admitting: Medical

## 2012-07-12 NOTE — Telephone Encounter (Signed)
She can get mammogram now/october, and i can see her back in routine f/u after the fact for routine gynecological and breast exam.

## 2012-07-12 NOTE — Telephone Encounter (Signed)
I SPOKE WITH THE PATIENT TO LET HER KNOW TO HAVE THE MAMMOGRAM AND THEN TO FOLLOW UP WITH HIM FOR HER ROUTINE CARE. CLS

## 2012-07-23 ENCOUNTER — Other Ambulatory Visit: Payer: Self-pay | Admitting: Medical

## 2012-07-26 ENCOUNTER — Other Ambulatory Visit: Payer: Self-pay | Admitting: Medical

## 2012-07-26 DIAGNOSIS — L570 Actinic keratosis: Secondary | ICD-10-CM | POA: Diagnosis not present

## 2012-07-26 DIAGNOSIS — L259 Unspecified contact dermatitis, unspecified cause: Secondary | ICD-10-CM | POA: Diagnosis not present

## 2012-07-27 DIAGNOSIS — Z1231 Encounter for screening mammogram for malignant neoplasm of breast: Secondary | ICD-10-CM | POA: Diagnosis not present

## 2012-08-05 ENCOUNTER — Other Ambulatory Visit: Payer: Medicare Other | Admitting: Medical

## 2012-08-12 ENCOUNTER — Encounter: Payer: Self-pay | Admitting: Medical

## 2012-08-12 ENCOUNTER — Ambulatory Visit (INDEPENDENT_AMBULATORY_CARE_PROVIDER_SITE_OTHER): Payer: Medicare Other | Admitting: Medical

## 2012-08-12 VITALS — BP 120/80 | HR 72 | Temp 97.8°F | Resp 16 | Ht 64.5 in | Wt 217.0 lb

## 2012-08-12 DIAGNOSIS — N8111 Cystocele, midline: Secondary | ICD-10-CM

## 2012-08-12 DIAGNOSIS — Z79899 Other long term (current) drug therapy: Secondary | ICD-10-CM | POA: Diagnosis not present

## 2012-08-12 DIAGNOSIS — D72829 Elevated white blood cell count, unspecified: Secondary | ICD-10-CM

## 2012-08-12 DIAGNOSIS — E119 Type 2 diabetes mellitus without complications: Secondary | ICD-10-CM | POA: Diagnosis not present

## 2012-08-12 DIAGNOSIS — E785 Hyperlipidemia, unspecified: Secondary | ICD-10-CM

## 2012-08-12 DIAGNOSIS — N952 Postmenopausal atrophic vaginitis: Secondary | ICD-10-CM

## 2012-08-12 DIAGNOSIS — Z1239 Encounter for other screening for malignant neoplasm of breast: Secondary | ICD-10-CM

## 2012-08-12 DIAGNOSIS — IMO0002 Reserved for concepts with insufficient information to code with codable children: Secondary | ICD-10-CM

## 2012-08-12 DIAGNOSIS — Z23 Encounter for immunization: Secondary | ICD-10-CM | POA: Diagnosis not present

## 2012-08-12 DIAGNOSIS — R32 Unspecified urinary incontinence: Secondary | ICD-10-CM

## 2012-08-12 LAB — LIPID PANEL
Cholesterol: 170 mg/dL (ref 0–200)
HDL: 53 mg/dL (ref >39)
LDL Cholesterol: 92 mg/dL (ref 0–99)
Total CHOL/HDL Ratio: 3.2 Ratio
Triglycerides: 126 mg/dL (ref <150)
VLDL: 25 mg/dL (ref 0–40)

## 2012-08-12 LAB — CBC WITH DIFFERENTIAL/PLATELET
Basophils Absolute: 0.1 10*3/uL (ref 0.0–0.1)
Basophils Relative: 0 % (ref 0–1)
Eosinophils Absolute: 0.5 10*3/uL (ref 0.0–0.7)
Eosinophils Relative: 4 % (ref 0–5)
HCT: 36.7 % (ref 36.0–46.0)
Hemoglobin: 11.9 g/dL — ABNORMAL LOW (ref 12.0–15.0)
Lymphocytes Relative: 25 % (ref 12–46)
Lymphs Abs: 3.3 10*3/uL (ref 0.7–4.0)
MCH: 25.3 pg — ABNORMAL LOW (ref 26.0–34.0)
MCHC: 32.4 g/dL (ref 30.0–36.0)
MCV: 77.9 fL — ABNORMAL LOW (ref 78.0–100.0)
Monocytes Absolute: 0.9 10*3/uL (ref 0.1–1.0)
Monocytes Relative: 7 % (ref 3–12)
Neutro Abs: 8.2 10*3/uL — ABNORMAL HIGH (ref 1.7–7.7)
Neutrophils Relative %: 64 % (ref 43–77)
Platelets: 385 10*3/uL (ref 150–400)
RBC: 4.71 MIL/uL (ref 3.87–5.11)
RDW: 13.8 % (ref 11.5–15.5)
WBC: 13 10*3/uL — ABNORMAL HIGH (ref 4.0–10.5)

## 2012-08-12 LAB — HEMOGLOBIN A1C
Hgb A1c MFr Bld: 7.5 % — ABNORMAL HIGH (ref <5.7)
Mean Plasma Glucose: 169 mg/dL — ABNORMAL HIGH (ref <117)

## 2012-08-12 LAB — HEPATIC FUNCTION PANEL
ALT: 55 U/L — ABNORMAL HIGH (ref 0–35)
AST: 47 U/L — ABNORMAL HIGH (ref 0–37)
Albumin: 4.3 g/dL (ref 3.5–5.2)
Alkaline Phosphatase: 83 U/L (ref 39–117)
Bilirubin, Direct: 0.1 mg/dL (ref 0.0–0.3)
Indirect Bilirubin: 0.3 mg/dL (ref 0.0–0.9)
Total Bilirubin: 0.4 mg/dL (ref 0.3–1.2)
Total Protein: 7 g/dL (ref 6.0–8.3)

## 2012-08-12 MED ORDER — INFLUENZA VIRUS VACC SPLIT PF IM SUSP: 0.5000 mL | Freq: Once | INTRAMUSCULAR | Status: DC

## 2012-08-12 MED ORDER — ESTROGENS, CONJUGATED 0.625 MG/GM VA CREA: TOPICAL_CREAM | VAGINAL | Status: DC

## 2012-08-12 NOTE — Addendum Note (Signed)
Addended by: Jac Canavan on: 08/12/2012 10:19 PM   Modules accepted: Level of Service

## 2012-08-12 NOTE — Progress Notes (Addendum)
Subjective: Here for routine f/u.  Compliant with medications.   Just completed mammogram last month.  Sees eye doctor yearly, dentist regularly.   Gained weight recently since surgery.  Sees Dr. Venetia Maxon in f/u next month regarding back pains.  She wants recheck on her WBC as her mother died of aplastic anemia.  She has concerns about bruise/pain somewhat persistent ache in her left lower leg where hit the leg prior to her last surgery few months ago.  She will be out of town soon as her granddaughter was diagnosed recently with melanoma and given just weeks to live.    Past Medical History  Diagnosis Date  . Hypertension   . Hyperlipidemia   . Glaucoma   . Urinary incontinence   . Chronic back pain   . Osteoarthritis   . Edema   . Female bladder prolapse   . Allergy   . Migraine   . GERD (gastroesophageal reflux disease)   . Diverticulosis   . Chronic diarrhea     Colestipol therapy  . Vertigo   . Adrenal mass, right     01/2010, MRI abdomen  . Blood transfusion 1970  . Vitamin D deficiency   . Rheumatic fever     age 65  . Mitral valve disease     Leak  . Diverticulitis   . Fatty liver   . H/O hiatal hernia   . 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 Asthma     "as a teenager"  . Type II diabetes mellitus   . Kidney stones   . History of UTI     "I've had a few"  . H/O mammogram 07/07/2010  . H/O bone density study 06/03/2011  . History of uterine cancer   . Hypothyroidism    Objective: Gen: wd, wn, nad Heart: RRR,normal S1, S2, no murmur Lungs: CTA Breasts: nontender, no mass or nodules, no skin changes, no axillary adenopathy Gyn: external genitalia with atrophic changes, no worrisome lesions, urethra and bladder floor bulges downwards and prolapses, pink mucosa, exam chaperoned by nurse Anus normal, normal tone, no lesions, occult negative stool  Assessment: Encounter Diagnoses  Name Primary?  . Type II or  unspecified type diabetes mellitus without mention of complication, not stated as uncontrolled Yes  . Hyperlipidemia   . Encounter for long-term (current) use of other medications   . Leukocytosis   . Screening for breast cancer   . Need for prophylactic vaccination and inoculation against influenza   . Atrophic vaginitis   . Cystocele   . Urinary incontinence    Plan: DM type II - labs today.  Work on changes in diet, exercise to lose weigh  C/t same medications.  Hyperlipidemia - labs today  Surveillance labs today.  Leukocytosis - CBC with diff and peripheral smear today  She is up to date on mammogram from 1 mo ago.  We will request copy fo mammogram.    Flu vaccine, VIS, and counseling given.  Atrophic vaginitis - begin trial of premarin cream  Cystocele, incontinence - she will let me know about referral to urology.  FYI - colonoscopy to be repeated 2014.  Consider re-imaging of adrenal mass 2014.  Bone density 2012 normal.

## 2012-08-12 NOTE — Patient Instructions (Signed)
Consider urology referral for bladder tack surgery.  Let me know about this.  I will send Premarin cream that you can use vaginally for atrophic changes.   We will call with lab results.    Consider colonoscopy 2014.    Work on [employer], exercise and try and lose at least back down to the weight you were prior.  As close to 200lb as possible.    Atrophic Vaginitis Atrophic vaginitis is a problem of low levels of estrogen in women. This problem can happen at any age. It is most common in women who have gone through menopause ("the change").  HOW WILL I KNOW IF I HAVE THIS PROBLEM? You may have:  Trouble with peeing (urinating), such as:  Going to the bathroom often.  A hard time holding your pee until you reach a bathroom.  Leaking pee.  Having pain when you pee.  Itching or a burning feeling.  Vaginal bleeding and spotting.  Pain during sex.  Dryness of the vagina.  A yellow, bad-smelling fluid (discharge) coming from the vagina. HOW WILL MY DOCTOR CHECK FOR THIS PROBLEM?  During your exam, your doctor will likely find the problem.  If there is a vaginal fluid, it may be checked for infection. HOW WILL THIS PROBLEM BE TREATED? Keep the vulvar skin as clean as possible. Moisturizers and lubricants can help with some of the symptoms. Estrogen replacement can help. There are 2 ways to take estrogen:  Systemic estrogen gets estrogen to your whole body. It takes many weeks or months before the symptoms get better.  You take an estrogen pill.  You use a skin patch. This is a patch that you put on your skin.  If you still have your uterus, your doctor may ask you to take a hormone. Talk to your doctor about the right medicine for you.  Estrogen cream.  This puts estrogen only at the part of your body where you apply it. The cream is put into the vagina or put on the vulvar skin. For some women, estrogen cream works faster than pills or the patch. CAN ALL WOMEN WITH  THIS PROBLEM USE ESTROGEN? No. Women with certain types of cancer, liver problems, or problems with blood clots should not take estrogen. Your doctor can help you decide the best treatment for your symptoms. Document Released: 03/23/2008 Document Revised: 12/28/2011 Document Reviewed: 03/23/2008 [facility] Patient Information 2013 [place], [place].

## 2012-08-15 LAB — PATHOLOGIST SMEAR REVIEW

## 2012-08-16 ENCOUNTER — Encounter: Payer: Self-pay | Admitting: Medical

## 2012-09-16 ENCOUNTER — Other Ambulatory Visit: Payer: Self-pay | Admitting: Medical

## 2012-10-19 HISTORY — PX: COLONOSCOPY: SHX174

## 2012-11-19 ENCOUNTER — Other Ambulatory Visit: Payer: Self-pay | Admitting: Medical

## 2012-12-03 ENCOUNTER — Other Ambulatory Visit: Payer: Self-pay | Admitting: Medical

## 2012-12-05 NOTE — Telephone Encounter (Signed)
Patient needs to follow up before medication runs out.

## 2012-12-12 ENCOUNTER — Other Ambulatory Visit: Payer: Self-pay | Admitting: Oral Surgery

## 2012-12-12 DIAGNOSIS — R52 Pain, unspecified: Secondary | ICD-10-CM

## 2012-12-12 DIAGNOSIS — M2669 Other specified disorders of temporomandibular joint: Secondary | ICD-10-CM

## 2012-12-17 ENCOUNTER — Other Ambulatory Visit: Payer: Medicare Other

## 2012-12-18 ENCOUNTER — Other Ambulatory Visit: Payer: Medicare Other

## 2012-12-19 DIAGNOSIS — E042 Nontoxic multinodular goiter: Secondary | ICD-10-CM | POA: Diagnosis not present

## 2012-12-21 ENCOUNTER — Ambulatory Visit
Admission: RE | Admit: 2012-12-21 | Discharge: 2012-12-21 | Disposition: A | Discharge status: Home / Self Care | Payer: Medicare Other | Source: Ambulatory Visit | Attending: Oral Surgery | Admitting: Oral Surgery

## 2012-12-21 DIAGNOSIS — S0300XA Dislocation of jaw, unspecified side, initial encounter: Secondary | ICD-10-CM | POA: Diagnosis not present

## 2012-12-21 DIAGNOSIS — R52 Pain, unspecified: Secondary | ICD-10-CM

## 2012-12-21 DIAGNOSIS — R6884 Jaw pain: Secondary | ICD-10-CM | POA: Diagnosis not present

## 2013-01-11 DIAGNOSIS — M4310 Spondylolisthesis, site unspecified: Secondary | ICD-10-CM | POA: Diagnosis not present

## 2013-01-12 ENCOUNTER — Other Ambulatory Visit: Payer: Self-pay | Admitting: Medical

## 2013-01-12 NOTE — Telephone Encounter (Signed)
IS THIS OK 

## 2013-01-13 MED ORDER — MELOXICAM 7.5 MG PO TABS: 7.5000 mg | ORAL_TABLET | Freq: Two times a day (BID) | ORAL | Status: DC

## 2013-01-13 NOTE — Addendum Note (Signed)
Addended by: Jac Canavan on: 01/13/2013 07:19 PM   Modules accepted: Orders

## 2013-01-25 DIAGNOSIS — M26639 Articular disc disorder of temporomandibular joint, unspecified side: Secondary | ICD-10-CM | POA: Diagnosis not present

## 2013-03-07 ENCOUNTER — Encounter: Payer: Self-pay | Admitting: Medical

## 2013-03-07 ENCOUNTER — Ambulatory Visit (INDEPENDENT_AMBULATORY_CARE_PROVIDER_SITE_OTHER): Payer: Medicare Other | Admitting: Medical

## 2013-03-07 VITALS — BP 120/80 | HR 78 | Temp 97.8°F | Resp 16 | Wt 206.0 lb

## 2013-03-07 DIAGNOSIS — E1140 Type 2 diabetes mellitus with diabetic neuropathy, unspecified: Secondary | ICD-10-CM

## 2013-03-07 DIAGNOSIS — E1149 Type 2 diabetes mellitus with other diabetic neurological complication: Secondary | ICD-10-CM | POA: Diagnosis not present

## 2013-03-07 DIAGNOSIS — H409 Unspecified glaucoma: Secondary | ICD-10-CM

## 2013-03-07 DIAGNOSIS — I10 Essential (primary) hypertension: Secondary | ICD-10-CM

## 2013-03-07 DIAGNOSIS — E1142 Type 2 diabetes mellitus with diabetic polyneuropathy: Secondary | ICD-10-CM | POA: Diagnosis not present

## 2013-03-07 DIAGNOSIS — E785 Hyperlipidemia, unspecified: Secondary | ICD-10-CM

## 2013-03-07 DIAGNOSIS — G8929 Other chronic pain: Secondary | ICD-10-CM

## 2013-03-07 DIAGNOSIS — E119 Type 2 diabetes mellitus without complications: Secondary | ICD-10-CM

## 2013-03-07 DIAGNOSIS — M549 Dorsalgia, unspecified: Secondary | ICD-10-CM

## 2013-03-07 LAB — CBC
HCT: 35.8 % — ABNORMAL LOW (ref 36.0–46.0)
Hemoglobin: 11.8 g/dL — ABNORMAL LOW (ref 12.0–15.0)
MCH: 25.5 pg — ABNORMAL LOW (ref 26.0–34.0)
MCHC: 33 g/dL (ref 30.0–36.0)
MCV: 77.5 fL — ABNORMAL LOW (ref 78.0–100.0)
Platelets: 366 10*3/uL (ref 150–400)
RBC: 4.62 MIL/uL (ref 3.87–5.11)
RDW: 14.8 % (ref 11.5–15.5)
WBC: 11.6 10*3/uL — ABNORMAL HIGH (ref 4.0–10.5)

## 2013-03-07 LAB — COMPREHENSIVE METABOLIC PANEL
ALT: 36 U/L — ABNORMAL HIGH (ref 0–35)
AST: 30 U/L (ref 0–37)
Albumin: 4.3 g/dL (ref 3.5–5.2)
Alkaline Phosphatase: 78 U/L (ref 39–117)
BUN: 24 mg/dL — ABNORMAL HIGH (ref 6–23)
CO2: 29 mEq/L (ref 19–32)
Calcium: 10.1 mg/dL (ref 8.4–10.5)
Chloride: 100 mEq/L (ref 96–112)
Creat: 0.72 mg/dL (ref 0.50–1.10)
Glucose, Bld: 142 mg/dL — ABNORMAL HIGH (ref 70–99)
Potassium: 5.3 mEq/L (ref 3.5–5.3)
Sodium: 136 mEq/L (ref 135–145)
Total Bilirubin: 0.4 mg/dL (ref 0.3–1.2)
Total Protein: 7 g/dL (ref 6.0–8.3)

## 2013-03-07 NOTE — Progress Notes (Signed)
Subjective:    Robyn Hill is a 67 y.o. female who presents for routine f/u.   DM type 2: Home blood sugar records: 119-142  Current symptoms/problems include glaucoma and have been stable, on drops, sees ophthalmology q54mo Daily foot checks, foot concerns: yes checks daily, still having numbness and burning pain in feet   Medication compliance: Current diet: in general, a "healthy" diet   Current exercise: walking Known diabetic complications: feet are cold and legs hurt after walking Cardiovascular risk factors: diabetes mellitus, hypertension and obesity (BMI >= 30 kg/m2)  Hyperlipidemia - compliant with medication, no c/o  HTN - compliant with medication, no c/o  Chronic back pain - been seeing [place] [facility], but given the difficulty dealing with their office regarding medication refills, wants to see if I'll go back to prescribing her medication.  Still taking hydrocodone/tylenol 5/325mg  1-2 daily, sometimes 3 daily with worse pain.  Past Medical History  Diagnosis Date  . Hypertension   . Hyperlipidemia   . Glaucoma   . Urinary incontinence   . Chronic back pain   . Osteoarthritis   . Edema   . Female bladder prolapse   . Allergy   . Migraine   . GERD (gastroesophageal reflux disease)   . Diverticulosis   . Chronic diarrhea     Colestipol therapy  . Adrenal mass, right     01/2010, MRI abdomen  . Blood transfusion 1970  . Vitamin D deficiency   . Rheumatic fever     age 9  . Diverticulitis   . Fatty liver   . H/O hiatal hernia   . Blind right eye     since childhood  . Asthma     "as a teenager"  . Type II diabetes mellitus   . Kidney stones   . History of UTI     remote past  . H/O mammogram 07/2012    normal  . H/O bone density study 06/03/2011  . Hypothyroidism   . History of uterine cancer     s/p hysterectomy     Past Surgical History  Procedure Laterality Date  . Tonsillectomy and adenoidectomy  1952  . Minor laceration repair     right eye  . Cardiac catheterization  11/2006  . Liver biopsy  04/1999  . Edsi for back pain  last 01/2009    multiple x 5  . Steroid injections of hip  last 02/2009    multiple x 4  . Eye surgery      Bil Blind R eye  . Posterior fusion lumbar spine  04/05/12  . Breast biopsy  08/1997    right  . Kidney stone surgery  1979  . Cholecystectomy    . Trabeculectomy  1996    bilaterally; "for glaucoma"  . Dilation and curettage of uterus  1980  . Back surgery    . Colonoscopy  10/2008    polyps  . Abdominal hysterectomy  1980    no further pap smears needed   ROS as in subjective above    Objective:    BP 120/80  Pulse 78  Temp(Src) 97.8 F (36.6 C) (Oral)  Resp 16  Wt 206 lb (93.441 kg)  BMI 34.83 kg/m2  Filed Vitals:   03/07/13 0952  BP: 120/80  Pulse: 78  Temp: 97.8 F (36.6 C)  Resp: 16    General appearance: alert, no distress, WD/WN Neck: supple, no lymphadenopathy, no thyromegaly, no masses Heart: RRR, normal S1, S2, no  murmurs Lungs: CTA bilaterally, no wheezes, rhonchi, or rales Pulses: 2+ symmetric, upper and lower extremities, normal cap refill Ext: no edema Foot exam: no foot deformity, normal pulses Neuro: foot monofilament exam seems to have moderat decrease in sensation today bilat   Assessment: Encounter Diagnoses  Name Primary?  . Type II or unspecified type diabetes mellitus without mention of complication, not stated as uncontrolled Yes  . Diabetic neuropathy   . Essential hypertension, benign   . Hyperlipidemia   . Glaucoma   . Chronic back pain    Plan: Labs today, c/t same medications.   Will call in order for diabetic shoes.  C/t daily foot checks. Discussed lyrica or re-trial on Gabapentin for neuropathy.   She will consider and let me know.  Reviewed prior labs.  I will resume prescribing her pain medication.  She has been controlled, no concerns for abuse, compliant.   C/t exercise, diabetic diet.

## 2013-03-08 DIAGNOSIS — L282 Other prurigo: Secondary | ICD-10-CM | POA: Diagnosis not present

## 2013-03-08 DIAGNOSIS — L570 Actinic keratosis: Secondary | ICD-10-CM | POA: Diagnosis not present

## 2013-03-08 DIAGNOSIS — L0100 Impetigo, unspecified: Secondary | ICD-10-CM | POA: Diagnosis not present

## 2013-03-08 LAB — HEMOGLOBIN A1C
Hgb A1c MFr Bld: 7.1 % — ABNORMAL HIGH (ref <5.7)
Mean Plasma Glucose: 157 mg/dL — ABNORMAL HIGH (ref <117)

## 2013-03-09 NOTE — Progress Notes (Signed)
Quick Note:  PT INFORMED WORD FOR WORD Labs show mild abnormalities of her white and red blood counts. She has kind of been this way for a while. Her HgbA1C was 7.1%. Overall her glucose numbers have been stable. As long as she is eating a diabetic healthy diet, I think we can leave things alone for now. Lets f/u 3-75mo. PT HAS APT SEPT 5 2014 ______

## 2013-03-18 ENCOUNTER — Other Ambulatory Visit: Payer: Self-pay | Admitting: Medical

## 2013-03-20 DIAGNOSIS — J310 Chronic rhinitis: Secondary | ICD-10-CM | POA: Diagnosis not present

## 2013-03-20 DIAGNOSIS — E042 Nontoxic multinodular goiter: Secondary | ICD-10-CM | POA: Diagnosis not present

## 2013-03-20 DIAGNOSIS — K219 Gastro-esophageal reflux disease without esophagitis: Secondary | ICD-10-CM | POA: Diagnosis not present

## 2013-03-24 ENCOUNTER — Telehealth: Payer: Self-pay | Admitting: Family Medicine

## 2013-03-24 NOTE — Telephone Encounter (Signed)
Patient called requesting a refill on Hydrocodone 5/325 mg. CLS

## 2013-03-24 NOTE — Telephone Encounter (Signed)
No answer. cls

## 2013-03-24 NOTE — Telephone Encounter (Signed)
Verify how often she is taking or how often she feels like she needs this?  Is she taking it currently?

## 2013-03-27 NOTE — Telephone Encounter (Signed)
Patient states that she uses this everyday. She mainly 2 to 3 a day depends upon her back and how she is felling. CLS

## 2013-03-27 NOTE — Telephone Encounter (Signed)
LMOM TO CB. CLS 

## 2013-03-28 ENCOUNTER — Other Ambulatory Visit: Payer: Self-pay | Admitting: Medical

## 2013-03-28 MED ORDER — HYDROCODONE-ACETAMINOPHEN 5-325 MG PO TABS: 1.0000 | ORAL_TABLET | Freq: Three times a day (TID) | ORAL | Status: DC | PRN

## 2013-03-28 NOTE — Progress Notes (Signed)
I called the patients medication into the pharmacy per Crosby Oyster PA-C. CLS

## 2013-04-17 ENCOUNTER — Other Ambulatory Visit: Payer: Self-pay | Admitting: Medical

## 2013-04-17 DIAGNOSIS — M545 Low back pain, unspecified: Secondary | ICD-10-CM | POA: Diagnosis not present

## 2013-04-17 DIAGNOSIS — M5137 Other intervertebral disc degeneration, lumbosacral region: Secondary | ICD-10-CM | POA: Diagnosis not present

## 2013-04-17 DIAGNOSIS — S335XXA Sprain of ligaments of lumbar spine, initial encounter: Secondary | ICD-10-CM | POA: Diagnosis not present

## 2013-04-20 DIAGNOSIS — E119 Type 2 diabetes mellitus without complications: Secondary | ICD-10-CM | POA: Diagnosis not present

## 2013-04-20 DIAGNOSIS — H40159 Residual stage of open-angle glaucoma, unspecified eye: Secondary | ICD-10-CM | POA: Diagnosis not present

## 2013-04-20 DIAGNOSIS — H538 Other visual disturbances: Secondary | ICD-10-CM | POA: Diagnosis not present

## 2013-04-20 DIAGNOSIS — H2510 Age-related nuclear cataract, unspecified eye: Secondary | ICD-10-CM | POA: Diagnosis not present

## 2013-05-03 ENCOUNTER — Other Ambulatory Visit: Payer: Self-pay | Admitting: Medical

## 2013-05-03 NOTE — Telephone Encounter (Signed)
Is this okay to refill? 

## 2013-05-04 NOTE — Telephone Encounter (Signed)
HAVE YOU DONE THIS

## 2013-05-05 NOTE — Telephone Encounter (Signed)
I called out the patients medication per David Tysinger PAC. CLS 

## 2013-05-17 ENCOUNTER — Encounter: Payer: Self-pay | Admitting: Medical

## 2013-05-22 ENCOUNTER — Ambulatory Visit: Payer: Medicare Other | Admitting: Medical

## 2013-05-23 DIAGNOSIS — H40159 Residual stage of open-angle glaucoma, unspecified eye: Secondary | ICD-10-CM | POA: Diagnosis not present

## 2013-05-24 ENCOUNTER — Other Ambulatory Visit: Payer: Self-pay

## 2013-05-29 DIAGNOSIS — H40159 Residual stage of open-angle glaucoma, unspecified eye: Secondary | ICD-10-CM | POA: Diagnosis not present

## 2013-06-04 ENCOUNTER — Other Ambulatory Visit: Payer: Self-pay | Admitting: Medical

## 2013-06-05 DIAGNOSIS — H40159 Residual stage of open-angle glaucoma, unspecified eye: Secondary | ICD-10-CM | POA: Diagnosis not present

## 2013-06-05 NOTE — Telephone Encounter (Signed)
She is on cancellation list

## 2013-06-05 NOTE — Telephone Encounter (Signed)
Is this okay to refill? 

## 2013-06-23 ENCOUNTER — Ambulatory Visit (INDEPENDENT_AMBULATORY_CARE_PROVIDER_SITE_OTHER): Payer: Medicare Other | Admitting: Medical

## 2013-06-23 ENCOUNTER — Encounter: Payer: Self-pay | Admitting: Medical

## 2013-06-23 VITALS — BP 130/60 | HR 72 | Temp 98.0°F | Resp 16 | Wt 208.0 lb

## 2013-06-23 DIAGNOSIS — E785 Hyperlipidemia, unspecified: Secondary | ICD-10-CM

## 2013-06-23 DIAGNOSIS — G8929 Other chronic pain: Secondary | ICD-10-CM

## 2013-06-23 DIAGNOSIS — M549 Dorsalgia, unspecified: Secondary | ICD-10-CM

## 2013-06-23 DIAGNOSIS — E039 Hypothyroidism, unspecified: Secondary | ICD-10-CM | POA: Diagnosis not present

## 2013-06-23 DIAGNOSIS — I10 Essential (primary) hypertension: Secondary | ICD-10-CM

## 2013-06-23 DIAGNOSIS — D72829 Elevated white blood cell count, unspecified: Secondary | ICD-10-CM | POA: Diagnosis not present

## 2013-06-23 DIAGNOSIS — E1140 Type 2 diabetes mellitus with diabetic neuropathy, unspecified: Secondary | ICD-10-CM

## 2013-06-23 DIAGNOSIS — B353 Tinea pedis: Secondary | ICD-10-CM

## 2013-06-23 DIAGNOSIS — H409 Unspecified glaucoma: Secondary | ICD-10-CM

## 2013-06-23 DIAGNOSIS — Z1211 Encounter for screening for malignant neoplasm of colon: Secondary | ICD-10-CM

## 2013-06-23 DIAGNOSIS — Z23 Encounter for immunization: Secondary | ICD-10-CM

## 2013-06-23 DIAGNOSIS — E1149 Type 2 diabetes mellitus with other diabetic neurological complication: Secondary | ICD-10-CM | POA: Diagnosis not present

## 2013-06-23 DIAGNOSIS — E119 Type 2 diabetes mellitus without complications: Secondary | ICD-10-CM | POA: Diagnosis not present

## 2013-06-23 DIAGNOSIS — E1142 Type 2 diabetes mellitus with diabetic polyneuropathy: Secondary | ICD-10-CM

## 2013-06-23 LAB — CBC WITH DIFFERENTIAL/PLATELET
Basophils Absolute: 0.1 10*3/uL (ref 0.0–0.1)
Basophils Relative: 1 % (ref 0–1)
Eosinophils Absolute: 0.4 10*3/uL (ref 0.0–0.7)
Eosinophils Relative: 5 % (ref 0–5)
HCT: 36.7 % (ref 36.0–46.0)
Hemoglobin: 11.8 g/dL — ABNORMAL LOW (ref 12.0–15.0)
Lymphocytes Relative: 30 % (ref 12–46)
Lymphs Abs: 2.8 10*3/uL (ref 0.7–4.0)
MCH: 25.7 pg — ABNORMAL LOW (ref 26.0–34.0)
MCHC: 32.2 g/dL (ref 30.0–36.0)
MCV: 79.8 fL (ref 78.0–100.0)
Monocytes Absolute: 0.7 10*3/uL (ref 0.1–1.0)
Monocytes Relative: 8 % (ref 3–12)
Neutro Abs: 5.2 10*3/uL (ref 1.7–7.7)
Neutrophils Relative %: 56 % (ref 43–77)
Platelets: 370 10*3/uL (ref 150–400)
RBC: 4.6 MIL/uL (ref 3.87–5.11)
RDW: 13.7 % (ref 11.5–15.5)
WBC: 9.1 10*3/uL (ref 4.0–10.5)

## 2013-06-23 MED ORDER — HYDROCODONE-ACETAMINOPHEN 7.5-325 MG PO TABS: ORAL_TABLET | ORAL | Status: DC

## 2013-06-23 NOTE — Patient Instructions (Addendum)
You are due for repeat colonoscopy.   Check with your insurance coverage and deductibles and let me know if you want to do it in November vs 2015.    I increased your pain medication dose today.  Use 1/2 - 1 tablet up to 3 times daily as needed for back pain.  We started the hepatitis B vaccine series today.   Return in 2 months for Hep B #2.    Repeat mammogram next fall 2015.  Depending upon your white counts, we may consider hematology referral in the future.   continue rest of medications as usual.

## 2013-06-23 NOTE — Progress Notes (Signed)
Subjective: Here for routine f/u.  Compliant with medications.     Chronic back pain - has seen Dr. Venetia Maxon a few times this year, but she c/t to have moderate low back pain.  Currently her pain medication isn't working all that well.  Still using norco TID as well as mobic.    DM type II - checks glucose, numbers always <130.  Eating healthy.  Diabetic neuropathy - taking cymbalta BID  Lipids - compliant, no issues with medications  HTN - BPs seem to be fine at home, compliant with medications  Has seen eye doctor recently.  Things have worsened, and at this point eye doctor had her stop her medication/drops as they no longer seem to be working  She has concern of foot fungus.  She is due for colonoscopy, has questions about this.   Past Medical History  Diagnosis Date  . Hypertension   . Hyperlipidemia   . Glaucoma   . Urinary incontinence   . Chronic back pain   . Osteoarthritis   . Edema   . Female bladder prolapse   . Allergy   . Migraine   . GERD (gastroesophageal reflux disease)   . Diverticulosis   . Chronic diarrhea     Colestipol therapy  . Adrenal mass, right     01/2010, MRI abdomen  . Blood transfusion 1970  . Vitamin D deficiency   . Rheumatic fever     age 67  . Diverticulitis   . Fatty liver   . H/O hiatal hernia   . Blind right eye     since childhood  . Asthma     "as a teenager"  . Type II diabetes mellitus   . Kidney stones   . History of UTI     remote past  . H/O mammogram 07/2012    normal  . H/O bone density study 06/03/2011  . Hypothyroidism   . History of uterine cancer     s/p hysterectomy   Objective: Gen: wd, wn, nad Heart: RRR,normal S1, S2, no murmur Lungs: CTA Abdomen: +bs, soft, nontender, no mass, no organomegaly Ext: no edema Pulses normal Foot exam - see separate foot exam  Assessment: Encounter Diagnoses  Name Primary?  . Chronic back pain   . Type II or unspecified type diabetes mellitus without mention of  complication, not stated as uncontrolled Yes  . Diabetic neuropathy   . Need for hepatitis B vaccination   . Hyperlipidemia   . Special screening for malignant neoplasms, colon   . Essential hypertension, benign   . Unspecified hypothyroidism   . Glaucoma   . Leukocytosis, unspecified   . Tinea pedis    Plan: Spent extensive amount of time >40 minutes reviewing chart records.  Chronic back pain - reviewed most recent neurosurgery notes.  Increase Norco to 7.5/325mg  up to TID for pain.  C/t cymbalta and mobic DM type II - labs today.  C/t diabetic diet, C/t same medications. Diabetic neuropathy - gave script for diabetic shoes.   Hep B vaccine, counseling, and VIS given.  return in 40mo for Hep B #2. hyperlipidemia - c/t same medication, low cholesterol diet Will refer for update/repeat colonoscopy, screening HTN - controled on current medication Hypothyroidism - labs today glaucoma - not doing well in this regard, managed by opthalmology Leukocytosis - recheck labs, consider hematology consult Tine pedis - begin OTC Lamisil She will return soon for high dose influenza vaccine when they become available

## 2013-06-24 LAB — T4, FREE: Free T4: 1.06 ng/dL (ref 0.80–1.80)

## 2013-06-24 LAB — LIPID PANEL
Cholesterol: 149 mg/dL (ref 0–200)
HDL: 54 mg/dL (ref >39)
LDL Cholesterol: 70 mg/dL (ref 0–99)
Total CHOL/HDL Ratio: 2.8 Ratio
Triglycerides: 127 mg/dL (ref <150)
VLDL: 25 mg/dL (ref 0–40)

## 2013-06-24 LAB — MICROALBUMIN / CREATININE URINE RATIO
Creatinine, Urine: 98.8 mg/dL
Microalb Creat Ratio: 5.1 mg/g (ref 0.0–30.0)
Microalb, Ur: 0.5 mg/dL (ref 0.00–1.89)

## 2013-06-24 LAB — HEMOGLOBIN A1C
Hgb A1c MFr Bld: 7.1 % — ABNORMAL HIGH (ref <5.7)
Mean Plasma Glucose: 157 mg/dL — ABNORMAL HIGH (ref <117)

## 2013-06-24 LAB — TSH: TSH: 1.584 u[IU]/mL (ref 0.350–4.500)

## 2013-07-19 ENCOUNTER — Other Ambulatory Visit: Payer: Self-pay | Admitting: Medical

## 2013-07-19 ENCOUNTER — Telehealth: Payer: Self-pay | Admitting: Medical

## 2013-07-19 MED ORDER — HYDROCODONE-ACETAMINOPHEN 7.5-325 MG PO TABS: ORAL_TABLET | ORAL | Status: DC

## 2013-07-19 NOTE — Telephone Encounter (Signed)
Pt called stating she tried to get her Hydrocodone 7.5-325 refilled at [facility] in [place] b/c she had 1 more refill on it, but her pharmacy denied it and advised her she would need a written prescription b/c of the new law on this type of pain medication being moved to a Class 2 drug

## 2013-07-21 NOTE — Telephone Encounter (Signed)
I called out her Norco to her pharmacy per Crosby Oyster PA-C. CLS

## 2013-07-21 NOTE — Progress Notes (Signed)
I called out her Norco to the pharmacy per Crosby Oyster PA-C. CLS

## 2013-07-21 NOTE — Telephone Encounter (Signed)
See refill reqeust

## 2013-07-24 ENCOUNTER — Encounter: Payer: Self-pay | Admitting: Medical

## 2013-07-24 ENCOUNTER — Other Ambulatory Visit: Payer: Medicare Other

## 2013-07-24 ENCOUNTER — Ambulatory Visit (INDEPENDENT_AMBULATORY_CARE_PROVIDER_SITE_OTHER): Payer: Medicare Other | Admitting: Medical

## 2013-07-24 VITALS — BP 112/80 | HR 92 | Temp 97.6°F | Resp 16 | Wt 206.0 lb

## 2013-07-24 DIAGNOSIS — M2560 Stiffness of unspecified joint, not elsewhere classified: Secondary | ICD-10-CM | POA: Diagnosis not present

## 2013-07-24 DIAGNOSIS — R0989 Other specified symptoms and signs involving the circulatory and respiratory systems: Secondary | ICD-10-CM

## 2013-07-24 DIAGNOSIS — G609 Hereditary and idiopathic neuropathy, unspecified: Secondary | ICD-10-CM

## 2013-07-24 DIAGNOSIS — I999 Unspecified disorder of circulatory system: Secondary | ICD-10-CM

## 2013-07-24 DIAGNOSIS — Z23 Encounter for immunization: Secondary | ICD-10-CM | POA: Diagnosis not present

## 2013-07-24 DIAGNOSIS — M2550 Pain in unspecified joint: Secondary | ICD-10-CM

## 2013-07-24 DIAGNOSIS — I739 Peripheral vascular disease, unspecified: Secondary | ICD-10-CM

## 2013-07-24 DIAGNOSIS — G5793 Unspecified mononeuropathy of bilateral lower limbs: Secondary | ICD-10-CM

## 2013-07-24 DIAGNOSIS — E119 Type 2 diabetes mellitus without complications: Secondary | ICD-10-CM

## 2013-07-24 DIAGNOSIS — R5381 Other malaise: Secondary | ICD-10-CM | POA: Diagnosis not present

## 2013-07-24 DIAGNOSIS — M2540 Effusion, unspecified joint: Secondary | ICD-10-CM | POA: Diagnosis not present

## 2013-07-24 LAB — SEDIMENTATION RATE: Sed Rate: 4 mm/hr (ref 0–22)

## 2013-07-24 LAB — RHEUMATOID FACTOR: Rhuematoid fact SerPl-aCnc: <10 IU/mL (ref <=14)

## 2013-07-24 MED ORDER — HYDROCODONE-ACETAMINOPHEN 7.5-325 MG PO TABS: ORAL_TABLET | ORAL | Status: DC

## 2013-07-24 NOTE — Progress Notes (Signed)
Subjective: Here for several issues ,she brought a list in today.  Her pharmacy gave her a form to bring to me to complete regarding her diabetic shoes.  She needs referral back to Dr. Eliseo Squires for repeat colonoscopy.  She is worried about circulation in feet.  Always stays cold in feet.  She also notes pain with walking.  Gets pains in lower legs, worried about circulation in general.   She is here to update vaccines.  She thinks she has RA.  She notes morning stiffness, joints pains, particular in both hands, fingers and knees.  Lately having to hold onto things to get up stairs given the knee pain.  At times can't grip very well due to finger joint pains.  Sometimes gets swelling of finger joints.  Takes a while to loosen everything up in the mornings.  She notes pressure points of pain in back, triceps, hands and knees.   Past Medical History  Diagnosis Date  . Hypertension   . Hyperlipidemia   . Glaucoma   . Urinary incontinence   . Chronic back pain   . Osteoarthritis   . Edema   . Female bladder prolapse   . Allergy   . Migraine   . GERD (gastroesophageal reflux disease)   . Diverticulosis   . Chronic diarrhea     Colestipol therapy  . Adrenal mass, right     01/2010, MRI abdomen  . Blood transfusion 1970  . Vitamin D deficiency   . Rheumatic fever     age 87  . Diverticulitis   . Fatty liver   . H/O hiatal hernia   . Blind right eye     since childhood  . Asthma     "as a teenager"  . Type II diabetes mellitus   . Kidney stones   . History of UTI     remote past  . H/O mammogram 07/2012    normal  . H/O bone density study 06/03/2011  . Hypothyroidism   . History of uterine cancer     s/p hysterectomy   Past Surgical History  Procedure Laterality Date  . Tonsillectomy and adenoidectomy  1952  . Minor laceration repair      right eye  . Cardiac catheterization  11/2006  . Liver biopsy  04/1999  . Edsi for back pain  last 01/2009    multiple x 5  . Steroid  injections of hip  last 02/2009    multiple x 4  . Eye surgery      Bil Blind R eye  . Posterior fusion lumbar spine  04/05/12  . Breast biopsy  08/1997    right  . Kidney stone surgery  1979  . Cholecystectomy    . Trabeculectomy  1996    bilaterally; "for glaucoma"  . Dilation and curettage of uterus  1980  . Back surgery    . Colonoscopy  10/2008    polyps  . Abdominal hysterectomy  1980    no further pap smears needed     ROS as in subjective  Objective: Filed Vitals:   07/24/13 1012  BP: 112/80  Pulse: 92  Temp: 97.6 F (36.4 C)  Resp: 16    General appearance: alert, no distress, WD/WN Heart: RRR, normal S1, S2, no murmurs Lungs: CTA bilaterally, no wheezes, rhonchi, or rales Pulses:1+ pedal pulses, but both feet cold, delayed cap refill MSK: bony arthritic changes of bilat 2nd/pointer fingers at DIP as well as similar findings bilat 5th  DIP.  No obvious joint swelling. Ext: no edema Skin: several small scaling lesions, round of bilat dorsal feet, right 5th toe distal lateral phalanx at DIP with callous Neuro: decreased monofilament exam    Assessment: Encounter Diagnoses  Name Primary?  . Polyarthralgia Yes  . Morning stiffness of joints   . Joint swelling   . Other malaise and fatigue   . Claudication   . Poor circulation   . Type II or unspecified type diabetes mellitus without mention of complication, not stated as uncontrolled   . Neuropathy of both feet   . Need for prophylactic vaccination and inoculation against influenza   . Need for hepatitis B vaccination    Plan: Polyarthralgia, morning stiffness, +/- joint stiffness, fatigue - labs and bilat hand xrays to eval for RA.   Likely just OA though.    She has claudication symptoms as well as 1+ pulses in feet, delayed cap refill.  We will refer for ABIs. DM type II, neuorapathy, poor circulation, right small toe pre-ulcerative callous - referral for diabetic shoes, form completed Counseled on the  influenza virus vaccine.  Vaccine information sheet given.  Influenza vaccine given after consent obtained. Counseled on the Hepatitis B virus vaccine.  Vaccine information sheet given.  Hepatitis B vaccine given after consent obtained.  Patient was advised to return in 6 months for Hep B #3 F/u pending labs

## 2013-07-25 LAB — CYCLIC CITRUL PEPTIDE ANTIBODY, IGG: Cyclic Citrullin Peptide Ab: <2 U/mL (ref 0.0–5.0)

## 2013-07-25 LAB — ANA: Anti Nuclear Antibody(ANA): NEGATIVE

## 2013-07-26 ENCOUNTER — Telehealth: Payer: Self-pay | Admitting: Medical

## 2013-07-26 NOTE — Telephone Encounter (Signed)
Must include reference to peripheral neuropathy with evidence of callus formation, h/o pre-ulcerative callus  and poor circulation.  Please mail this to patient when complete.

## 2013-07-26 NOTE — Telephone Encounter (Signed)
pls see me about this

## 2013-07-28 DIAGNOSIS — Z1231 Encounter for screening mammogram for malignant neoplasm of breast: Secondary | ICD-10-CM | POA: Diagnosis not present

## 2013-07-28 DIAGNOSIS — M2540 Effusion, unspecified joint: Secondary | ICD-10-CM | POA: Diagnosis not present

## 2013-07-28 DIAGNOSIS — I999 Unspecified disorder of circulatory system: Secondary | ICD-10-CM | POA: Diagnosis not present

## 2013-07-28 DIAGNOSIS — M79609 Pain in unspecified limb: Secondary | ICD-10-CM | POA: Diagnosis not present

## 2013-07-28 DIAGNOSIS — I739 Peripheral vascular disease, unspecified: Secondary | ICD-10-CM | POA: Diagnosis not present

## 2013-07-28 DIAGNOSIS — M2560 Stiffness of unspecified joint, not elsewhere classified: Secondary | ICD-10-CM | POA: Diagnosis not present

## 2013-08-04 ENCOUNTER — Telehealth: Payer: Self-pay | Admitting: Medical

## 2013-08-04 NOTE — Telephone Encounter (Signed)
lm

## 2013-08-07 ENCOUNTER — Telehealth: Payer: Self-pay | Admitting: Family Medicine

## 2013-08-07 DIAGNOSIS — M47817 Spondylosis without myelopathy or radiculopathy, lumbosacral region: Secondary | ICD-10-CM | POA: Diagnosis not present

## 2013-08-07 DIAGNOSIS — IMO0002 Reserved for concepts with insufficient information to code with codable children: Secondary | ICD-10-CM | POA: Diagnosis not present

## 2013-08-07 DIAGNOSIS — M545 Low back pain, unspecified: Secondary | ICD-10-CM | POA: Diagnosis not present

## 2013-08-07 DIAGNOSIS — M5137 Other intervertebral disc degeneration, lumbosacral region: Secondary | ICD-10-CM | POA: Diagnosis not present

## 2013-08-07 NOTE — Telephone Encounter (Signed)
I fax over the request for a new diabetic meter. CLS

## 2013-08-07 NOTE — Telephone Encounter (Signed)
Pt called and states needs new Diabetic Meter.  She has strips for Accucheck Compact Plus, please send to [facility].

## 2013-08-10 DIAGNOSIS — D1779 Benign lipomatous neoplasm of other sites: Secondary | ICD-10-CM | POA: Diagnosis not present

## 2013-08-10 DIAGNOSIS — Z800 Family history of malignant neoplasm of digestive organs: Secondary | ICD-10-CM | POA: Diagnosis not present

## 2013-08-10 DIAGNOSIS — Z1211 Encounter for screening for malignant neoplasm of colon: Secondary | ICD-10-CM | POA: Diagnosis not present

## 2013-08-18 ENCOUNTER — Other Ambulatory Visit: Payer: Self-pay | Admitting: Medical

## 2013-08-21 DIAGNOSIS — G8929 Other chronic pain: Secondary | ICD-10-CM | POA: Diagnosis not present

## 2013-08-21 DIAGNOSIS — E559 Vitamin D deficiency, unspecified: Secondary | ICD-10-CM | POA: Diagnosis not present

## 2013-08-21 DIAGNOSIS — Z8542 Personal history of malignant neoplasm of other parts of uterus: Secondary | ICD-10-CM | POA: Diagnosis not present

## 2013-08-21 DIAGNOSIS — Z809 Family history of malignant neoplasm, unspecified: Secondary | ICD-10-CM | POA: Diagnosis not present

## 2013-08-21 DIAGNOSIS — Z800 Family history of malignant neoplasm of digestive organs: Secondary | ICD-10-CM | POA: Diagnosis not present

## 2013-08-21 DIAGNOSIS — Q438 Other specified congenital malformations of intestine: Secondary | ICD-10-CM | POA: Diagnosis not present

## 2013-08-21 DIAGNOSIS — R209 Unspecified disturbances of skin sensation: Secondary | ICD-10-CM | POA: Diagnosis not present

## 2013-08-21 DIAGNOSIS — K5730 Diverticulosis of large intestine without perforation or abscess without bleeding: Secondary | ICD-10-CM | POA: Diagnosis not present

## 2013-08-21 DIAGNOSIS — Z79899 Other long term (current) drug therapy: Secondary | ICD-10-CM | POA: Diagnosis not present

## 2013-08-21 DIAGNOSIS — K648 Other hemorrhoids: Secondary | ICD-10-CM | POA: Diagnosis not present

## 2013-08-21 DIAGNOSIS — Z8489 Family history of other specified conditions: Secondary | ICD-10-CM | POA: Diagnosis not present

## 2013-08-21 DIAGNOSIS — Z1211 Encounter for screening for malignant neoplasm of colon: Secondary | ICD-10-CM | POA: Diagnosis not present

## 2013-08-21 DIAGNOSIS — Z86010 Personal history of colonic polyps: Secondary | ICD-10-CM | POA: Diagnosis not present

## 2013-08-21 DIAGNOSIS — K219 Gastro-esophageal reflux disease without esophagitis: Secondary | ICD-10-CM | POA: Diagnosis not present

## 2013-08-21 DIAGNOSIS — D1779 Benign lipomatous neoplasm of other sites: Secondary | ICD-10-CM | POA: Diagnosis not present

## 2013-08-21 DIAGNOSIS — Z87442 Personal history of urinary calculi: Secondary | ICD-10-CM | POA: Diagnosis not present

## 2013-08-21 DIAGNOSIS — M549 Dorsalgia, unspecified: Secondary | ICD-10-CM | POA: Diagnosis not present

## 2013-08-21 LAB — HM COLONOSCOPY

## 2013-08-22 ENCOUNTER — Telehealth: Payer: Self-pay | Admitting: Medical

## 2013-08-22 DIAGNOSIS — M48061 Spinal stenosis, lumbar region without neurogenic claudication: Secondary | ICD-10-CM | POA: Diagnosis not present

## 2013-08-22 DIAGNOSIS — M5137 Other intervertebral disc degeneration, lumbosacral region: Secondary | ICD-10-CM | POA: Diagnosis not present

## 2013-08-22 DIAGNOSIS — Z981 Arthrodesis status: Secondary | ICD-10-CM | POA: Diagnosis not present

## 2013-08-22 NOTE — Telephone Encounter (Signed)
LM

## 2013-08-29 DIAGNOSIS — L570 Actinic keratosis: Secondary | ICD-10-CM | POA: Diagnosis not present

## 2013-08-29 DIAGNOSIS — L259 Unspecified contact dermatitis, unspecified cause: Secondary | ICD-10-CM | POA: Diagnosis not present

## 2013-08-29 DIAGNOSIS — B359 Dermatophytosis, unspecified: Secondary | ICD-10-CM | POA: Diagnosis not present

## 2013-08-29 DIAGNOSIS — B353 Tinea pedis: Secondary | ICD-10-CM | POA: Diagnosis not present

## 2013-09-14 ENCOUNTER — Other Ambulatory Visit: Payer: Self-pay | Admitting: Medical

## 2013-09-18 ENCOUNTER — Other Ambulatory Visit: Payer: Self-pay | Admitting: Medical

## 2013-09-18 ENCOUNTER — Telehealth: Payer: Self-pay | Admitting: Internal Medicine

## 2013-09-18 DIAGNOSIS — M47817 Spondylosis without myelopathy or radiculopathy, lumbosacral region: Secondary | ICD-10-CM | POA: Diagnosis not present

## 2013-09-18 DIAGNOSIS — M545 Low back pain, unspecified: Secondary | ICD-10-CM | POA: Diagnosis not present

## 2013-09-18 DIAGNOSIS — M5137 Other intervertebral disc degeneration, lumbosacral region: Secondary | ICD-10-CM | POA: Diagnosis not present

## 2013-09-18 DIAGNOSIS — IMO0002 Reserved for concepts with insufficient information to code with codable children: Secondary | ICD-10-CM | POA: Diagnosis not present

## 2013-09-18 MED ORDER — HYDROCODONE-ACETAMINOPHEN 7.5-325 MG PO TABS: ORAL_TABLET | ORAL | Status: DC

## 2013-09-18 NOTE — Telephone Encounter (Signed)
done

## 2013-09-18 NOTE — Telephone Encounter (Signed)
Pt needs a refill on hydrocodone-act 7.5-325. Pt wants to come pick up the rx today when he goes to his appt.

## 2013-09-19 ENCOUNTER — Other Ambulatory Visit: Payer: Self-pay | Admitting: Neurosurgery

## 2013-10-09 ENCOUNTER — Encounter: Payer: Self-pay | Admitting: Medical

## 2013-10-09 ENCOUNTER — Ambulatory Visit (INDEPENDENT_AMBULATORY_CARE_PROVIDER_SITE_OTHER): Payer: Medicare Other | Admitting: Medical

## 2013-10-09 VITALS — BP 132/80 | HR 80 | Temp 98.1°F | Resp 16 | Wt 209.0 lb

## 2013-10-09 DIAGNOSIS — E119 Type 2 diabetes mellitus without complications: Secondary | ICD-10-CM | POA: Diagnosis not present

## 2013-10-09 DIAGNOSIS — R7309 Other abnormal glucose: Secondary | ICD-10-CM

## 2013-10-09 LAB — POCT GLYCOSYLATED HEMOGLOBIN (HGB A1C): Hemoglobin A1C: 7.4

## 2013-10-09 LAB — POCT CBG (FASTING - GLUCOSE)-MANUAL ENTRY: Glucose Fasting, POC: 134 mg/dL — AB (ref 70–99)

## 2013-10-09 MED ORDER — EXENATIDE ER 2 MG ~~LOC~~ PEN: 2.0000 mg | PEN_INJECTOR | SUBCUTANEOUS | Status: DC

## 2013-10-09 NOTE — Progress Notes (Signed)
Subjective: Here for questions about blood sugars.  Lately seeing numbers all over the place.  First noticed changes in her numbers back in November.  8:15 am fasting was 178.  Yesterday 11:47am glucose 110 fasting.  12/20 was 167 fasting in the morning.   12/19 was 191 about an hour after eating.  At lunch that day 180 glucose few hours after lunch.  12/18 at 4:38pm before dinner had sugar of 285.    Takes Janumet BID.  Checking glucose once daily until recently, and now takes 2-3 times daily.  Sometimes will take at random times.  Is sluggish at time.   Easier to take a nap of late.  She denies polydipsia, polyuria, no vision changes, no weight changes.  Denies hypoglycemia.     Past Medical History  Diagnosis Date  . Hypertension   . Hyperlipidemia   . Glaucoma   . Urinary incontinence   . Chronic back pain   . Osteoarthritis   . Edema   . Female bladder prolapse   . Allergy   . Migraine   . GERD (gastroesophageal reflux disease)   . Diverticulosis   . Chronic diarrhea     Colestipol therapy  . Adrenal mass, right     01/2010, MRI abdomen  . Blood transfusion 1970  . Vitamin D deficiency   . Rheumatic fever     age 89  . Diverticulitis   . Fatty liver   . H/O hiatal hernia   . Blind right eye     since childhood  . Asthma     "as a teenager"  . Type II diabetes mellitus   . Kidney stones   . History of UTI     remote past  . H/O mammogram 07/2012    normal  . H/O bone density study 06/03/2011  . Hypothyroidism   . History of uterine cancer     s/p hysterectomy   ROS as in subjective  Objective: Filed Vitals:   10/09/13 1140  BP: 132/80  Pulse: 80  Temp: 98.1 F (36.7 C)  Resp: 16    General appearance: alert, no distress, WD/WN   Assessment: Encounter Diagnoses  Name Primary?  . Type II or unspecified type diabetes mellitus without mention of complication, not stated as uncontrolled Yes  . Other abnormal glucose      Plan: After reviewing her  glucometer readings, our glucometer readings, this likely suggests that the diabetes disease process is getting worse.  She will c/t Janumet, but begin once weekly Bydureon.   Demonstrated proper use of Bydureon, advised she check glucose regularly, eat consistent and healthy diabetes diet, f/u in 68mo.  Follow-up 68mo

## 2013-10-11 ENCOUNTER — Other Ambulatory Visit: Payer: Self-pay | Admitting: Medical

## 2013-10-11 NOTE — Telephone Encounter (Signed)
IS THIS OKAY 

## 2013-10-16 ENCOUNTER — Other Ambulatory Visit: Payer: Self-pay | Admitting: Family Medicine

## 2013-10-16 ENCOUNTER — Other Ambulatory Visit: Payer: Self-pay | Admitting: Medical

## 2013-10-16 MED ORDER — BLOOD GLUCOSE METER KIT: PACK | Status: DC

## 2013-10-17 ENCOUNTER — Telehealth: Payer: Self-pay | Admitting: Internal Medicine

## 2013-10-17 ENCOUNTER — Telehealth: Payer: Self-pay | Admitting: Medical

## 2013-10-17 NOTE — Telephone Encounter (Signed)
I fax the forms back over to [facility] for her new meter and supplies. CLS

## 2013-10-17 NOTE — Telephone Encounter (Signed)
Refill request for accu-chek aviva plus meter, accu-chek softclik lancets, accu-chek compact plus strips to [facility]

## 2013-10-18 ENCOUNTER — Other Ambulatory Visit: Payer: Self-pay | Admitting: Medical

## 2013-10-18 ENCOUNTER — Telehealth: Payer: Self-pay | Admitting: Medical

## 2013-10-18 MED ORDER — HYDROCODONE-ACETAMINOPHEN 7.5-325 MG PO TABS: ORAL_TABLET | ORAL | Status: DC

## 2013-10-18 NOTE — Telephone Encounter (Signed)
needs refill on hydroconde Rx      Pt would like to pick up multiple Rx's at once so they do not have to come in each month, I told patient that I would relay that message to you but I was not sure if we could do that or not Also advised patient that we really need 24 hr notice for prescriptions because patient originally wanted to come by and pick up Rx now   Please call when Rx's are ready

## 2013-10-18 NOTE — Telephone Encounter (Signed)
rx ready 

## 2013-10-20 ENCOUNTER — Other Ambulatory Visit (HOSPITAL_COMMUNITY): Payer: Self-pay | Admitting: *Deleted

## 2013-10-20 NOTE — Pre-Procedure Instructions (Addendum)
LOISTINE EBERLIN  10/20/2013   Your procedure is scheduled on:  11/02/13  Report to [facility] short stay admitting at 530 AM.  Call this number if you have problems the morning of surgery: [phone]   Remember:   Do not eat food or drink liquids after midnight.   Take these medicines the morning of surgery with A SIP OF WATER: DEXILANT 60 MG capsule,  DULoxetine (CYMBALTA) 60 MG capsule, if needed: HYDROcodone-acetaminophen (NORCO) 7.5-325 MG per tablet for pain Stop taking Aspirin, vitamins, and herbal medications. Do not take any NSAIDs ie: Ibuprofen, Advil, Naproxen or any medication containing Aspirin ( meloxicam (MOBIC) 7.5 MG tablet) Stop 5 days prior to procedure 10/28/13  Do not wear jewelry, make-up or nail polish.  Do not wear lotions, powders, or perfumes. You may wear deodorant.  Do not shave 48 hours prior to surgery.   Do not bring valuables to the hospital.  [facility] is not responsible for any belongings or valuables.               Contacts, dentures or bridgework may not be worn into surgery.  Leave suitcase in the car. After surgery it may be brought to your room.  For patients admitted to the hospital, discharge time is determined by your  treatment team.               Patients discharged the day of surgery will not be allowed to drive home.  Name and phone number of your driver:   Special Instructions: Shower using CHG 2 nights before surgery and the night before surgery.  If you shower the day of surgery use CHG.  Use special wash - you have one bottle of CHG for all showers.  You should use approximately 1/3 of the bottle for each shower.   Please read over the following fact sheets that you were given: Pain Booklet, Coughing and Deep Breathing, Blood Transfusion Information, MRSA Information and Surgical Site Infection Prevention

## 2013-10-23 ENCOUNTER — Ambulatory Visit (HOSPITAL_COMMUNITY)
Admission: RE | Admit: 2013-10-23 | Discharge: 2013-10-23 | Disposition: A | Discharge status: Home / Self Care | Payer: Medicare Other | Source: Ambulatory Visit | Attending: Anesthesiology | Admitting: Anesthesiology

## 2013-10-23 ENCOUNTER — Encounter (HOSPITAL_COMMUNITY)
Admission: RE | Admit: 2013-10-23 | Discharge: 2013-10-23 | Disposition: A | Discharge status: Home / Self Care | Payer: Medicare Other | Source: Ambulatory Visit | Attending: Neurosurgery | Admitting: Neurosurgery

## 2013-10-23 ENCOUNTER — Encounter (HOSPITAL_COMMUNITY): Payer: Self-pay

## 2013-10-23 DIAGNOSIS — K219 Gastro-esophageal reflux disease without esophagitis: Secondary | ICD-10-CM | POA: Insufficient documentation

## 2013-10-23 DIAGNOSIS — Z01812 Encounter for preprocedural laboratory examination: Secondary | ICD-10-CM | POA: Diagnosis not present

## 2013-10-23 DIAGNOSIS — I6529 Occlusion and stenosis of unspecified carotid artery: Secondary | ICD-10-CM | POA: Insufficient documentation

## 2013-10-23 DIAGNOSIS — Z0183 Encounter for blood typing: Secondary | ICD-10-CM | POA: Insufficient documentation

## 2013-10-23 DIAGNOSIS — I771 Stricture of artery: Secondary | ICD-10-CM | POA: Insufficient documentation

## 2013-10-23 DIAGNOSIS — J986 Disorders of diaphragm: Secondary | ICD-10-CM | POA: Insufficient documentation

## 2013-10-23 DIAGNOSIS — Z01818 Encounter for other preprocedural examination: Secondary | ICD-10-CM | POA: Insufficient documentation

## 2013-10-23 DIAGNOSIS — H409 Unspecified glaucoma: Secondary | ICD-10-CM | POA: Diagnosis not present

## 2013-10-23 DIAGNOSIS — E039 Hypothyroidism, unspecified: Secondary | ICD-10-CM | POA: Diagnosis not present

## 2013-10-23 DIAGNOSIS — Z981 Arthrodesis status: Secondary | ICD-10-CM | POA: Insufficient documentation

## 2013-10-23 DIAGNOSIS — Z01810 Encounter for preprocedural cardiovascular examination: Secondary | ICD-10-CM | POA: Insufficient documentation

## 2013-10-23 DIAGNOSIS — I700 Atherosclerosis of aorta: Secondary | ICD-10-CM | POA: Insufficient documentation

## 2013-10-23 DIAGNOSIS — E785 Hyperlipidemia, unspecified: Secondary | ICD-10-CM | POA: Diagnosis not present

## 2013-10-23 DIAGNOSIS — I10 Essential (primary) hypertension: Secondary | ICD-10-CM | POA: Diagnosis not present

## 2013-10-23 DIAGNOSIS — E669 Obesity, unspecified: Secondary | ICD-10-CM | POA: Insufficient documentation

## 2013-10-23 LAB — CBC
HCT: 39.4 % (ref 36.0–46.0)
Hemoglobin: 12.8 g/dL (ref 12.0–15.0)
MCH: 26.2 pg (ref 26.0–34.0)
MCHC: 32.5 g/dL (ref 30.0–36.0)
MCV: 80.7 fL (ref 78.0–100.0)
Platelets: 340 10*3/uL (ref 150–400)
RBC: 4.88 MIL/uL (ref 3.87–5.11)
RDW: 13.3 % (ref 11.5–15.5)
WBC: 12.6 10*3/uL — ABNORMAL HIGH (ref 4.0–10.5)

## 2013-10-23 LAB — BASIC METABOLIC PANEL
BUN: 23 mg/dL (ref 6–23)
CO2: 28 mEq/L (ref 19–32)
Calcium: 9.8 mg/dL (ref 8.4–10.5)
Chloride: 98 mEq/L (ref 96–112)
Creatinine, Ser: 0.59 mg/dL (ref 0.50–1.10)
GFR calc Af Amer: >90 mL/min (ref >90)
GFR calc non Af Amer: >90 mL/min (ref >90)
Glucose, Bld: 155 mg/dL — ABNORMAL HIGH (ref 70–99)
Potassium: 4.6 mEq/L (ref 3.7–5.3)
Sodium: 138 mEq/L (ref 137–147)

## 2013-10-23 LAB — TYPE AND SCREEN
ABO/RH(D): B POS
Antibody Screen: NEGATIVE

## 2013-10-23 LAB — SURGICAL PCR SCREEN
MRSA, PCR: NEGATIVE
Staphylococcus aureus: NEGATIVE

## 2013-10-23 NOTE — Progress Notes (Signed)
Pt denies SOB, chest pain, and being under the care of a cardiologist. Pt stated that she had a cardiac cath in 2008 at [facility] in [place]; no records found according to medical records.Pt chart left for [place], [place]  (anesthesia) to review chest x ray.

## 2013-10-24 ENCOUNTER — Telehealth: Payer: Self-pay | Admitting: Medical

## 2013-10-24 NOTE — Progress Notes (Addendum)
Anesthesia Chart Review:  Patient is a 68 year old female scheduled for exploration and extension of fusion (L4-5) with PLIF at 3-4 on 11/02/13 by Dr. Vertell Limber.   History includes obesity, non-smoker, HTN, HLD, glaucoma, osteoarthritis, chronic back pain, edema, bladder prolapse, migraines, GERD, diverticulitis, right adrenal mass (likely benign adenoma by 02/2011 CT), rheumatic fever at age [age], fatty liver, hiatal hernia, DM2, nephrolithiasis, hypothyroidism, TMJ, uterine cancer s/p hysterectomy, blind in right eye, asthma as a teenager. She is s/p L4-5 fusion 03/2012. PCP is listed as Dr. Jill Alexanders.  EKG on 10/23/13 showed NSR. She reported a cardiac cath and echo ~ 2008.  Records are pending.  Preoperative CXR and labs noted. A1C was 7.4 on 10/09/13. AST/ALT were in the 30's in May 2014.  I'll review previous cardiac records once available.  [facility] Short Stay Center/Anesthesiology Phone [phone] 10/24/2013 3:58 PM  Addendum: 10/25/2013 5:50 PM Reviewed stress echo from [facility] and cardiac cath from [facility] in [place] from 2008.  Initially stress echo on 12/08/06 showed normal LV systolic function, slight prolapse of the anterior mitral leaflet with slight thickening, severe hypokinesis in the basal portion of the inferior wall, borderline pulmonary hypertension with RVSP of 42 mmHG, mild to moderate MR/TR.  Because of the wall motion abnormality, cardiac cath was recommended and done on 12/17/06 and showed minor plaquing in the LAD, CX, and RCA but insignificant CAD, normal LV function.  She continues to do well without chest pain or SOB.  Anticipate she can proceed as planned.

## 2013-10-24 NOTE — Telephone Encounter (Signed)
lm

## 2013-10-24 NOTE — Telephone Encounter (Signed)
LM

## 2013-11-01 MED ORDER — CEFAZOLIN SODIUM-DEXTROSE 2-3 GM-% IV SOLR
2.0000 g | INTRAVENOUS | Status: AC
  Administered 2013-11-02: 2 g via INTRAVENOUS
  Filled 2013-11-01: qty 50

## 2013-11-01 NOTE — H&P (Signed)
Patient ID:                [id number] Patient:                     Robyn Hill                                       Date of Birth:   [birth year] Visit Type:                Office Visit                                                         Date:   09/18/2013 11:30 AM Provider:                  Marchia Meiers. Vertell Limber MD   This 68 year old female presents for Follow Up of back pain.   HISTORY OF PRESENT ILLNESS: 1.  Follow Up of back pain  Patient continues to have significant pain.  She has developed adjacent segment disease with severe spinal stenosis at the L3-L4 level.  I have recommended that she undergo decompression and fusion at the L3-L4 level with exploration of the L4-L5 level to make sure that there has been adequate arthrodesis at this level.  She has also been advised of the importance of weight loss physical conditioning and she is going to try to work on that.     Medical/Surgical/Interim History Reviewed, no change.       PAST MEDICAL HISTORY, SURGICAL HISTORY, FAMILY HISTORY, SOCIAL HISTORY AND REVIEW OF SYSTEMS I have reviewed the patient's past medical, surgical, family and social history as well as the comprehensive review of systems as included on the [facility] history form dated, which I have signed.   Family History: Reviewed, no changes.  Last detailed document date:08/07/2013.   Social History: Reviewed, no changes. Last detailed document date: 08/07/2013.        MEDICATIONS(added, continued or stopped this visit):   Medication Dose Prescribed Else Ind Started Stopped ATORVASTATIN CALCIUM UNKNOWN Y     colestipol 1 gram tablet 1 gram N 04/17/2013   Cymbalta 60 mg capsule,delayed release 60 mg Y     Dexilant 60 mg capsule, delayed release 60 mg Y     enalapril maleate 20 mg tablet 20 mg Y     hydrochlorothiazide 25 mg tablet 25 mg Y     hydrocodone 5 mg-acetaminophen 325 mg tablet 5 mg-325  mg N 02/24/2013   Janumet XR 50 mg-1,000 mg tablet,extended release 50 mg-1,000 mg Y     meloxicam 7.5 mg tablet 7.5 mg Y         ALLERGIES:   Ingredient Reaction Medication Name Comment ACETAMINOPHEN Nausea PERCOCET   OXYCODONE HCL Nausea PERCOCET       Vitals Date Temp F BP Pulse Ht In Wt Lb BMI BSA Pain Score 09/18/2013   128/78 85 64 210 36.05   1/10         DIAGNOSTIC RESULTS Diagnostic report text CLINICAL DATA: Lumbar radiculopathy. Lumbar fusion.  EXAM: MRI LUMBAR SPINE WITHOUT AND WITH CONTRAST  TECHNIQUE: Multiplanar and multiecho pulse sequences of the lumbar spine  were obtained without and with intravenous contrast.  CONTRAST: 19 mL MultiHance IV  COMPARISON: Lumbar MRI 08/07/2011  FINDINGS: Pedicle screw and interbody fusion L4-5. Negative for fracture or mass lesion. Conus medullaris is normal and terminates at L1-2. No enhancing lesions are seen postcontrast administration.  T12-L1: Moderate disc degeneration and spondylosis, unchanged  L1-2: Mild disk degeneration  L2-3: Mild disc and mild facet degeneration.  L3-4: Progression of disc degeneration and diffuse disk protrusion. Severe facet degeneration also has progressed. There is now severe spinal stenosis at this level compared with mild stenosis previously. This is compatible with adjacent segment degenerative change.  L4-5: 6 mm anterior slip has improved in the interval. Pedicle screw and interbody fusion. Posterior decompression without stenosis  L5-S1: Marked disc degeneration with spondylosis, unchanged from the prior study. No significant stenosis.  IMPRESSION: Satisfactory fusion at L4-5. The patient has developed adjacent segment disease at L3-4 with severe spinal stenosis.   Electronically Signed By: Franchot Gallo M.D. On: 08/22/2013 10:31       IMPRESSION Adjacent level severe spinal stenosis L3-L4 level with severe back and lower  extremity pain.  I have recommended exploration of L4-L5 fusion with decompression and fusion at the L3-L4 level.  She wishes to proceed with surgery.  This has been scheduled for 10/27/13.  She already has a LSO brace.   Assessment/Plan # Detail Type Description 1. Assessment Lumbar spondylosis (721.3).       2. Assessment Lumbar radiculopathy (724.4).       3. Assessment Lumbago (724.2).       4. Assessment Lumbar disc degenerative disease (722.52).       5. Assessment Lumbar spinal stenosis (724.02).       6. Assessment Acquired spondylolisthesis (738.4).           Pain Assessment/Treatment Pain Scale: 1/10. Method: Numeric Pain Intensity Scale. Pain Assessment/Treatment follow-up plan of care: patient currently takes pain medication.   Exploration of L4-L5 fusion with decompression and fusion L3-L4 level.   Orders: Diagnostic Procedures: Assessment Procedure 738.4 Exploration and Extension of Fusion  - L3-L4                Provider:  Marchia Meiers. Vertell Limber MD  09/23/2013 03:27 PM Dictation edited by: Marchia Meiers. Vertell Limber     Patient ID:                [id number] Patient:                     Robyn Hill                                       Date of Birth:   [birth year] Visit Type:                Office Visit                                                         Date:   08/07/2013 09:30 AM Provider:                  Marchia Meiers. Vertell Limber     This 68 year old female presents for back pain.   HISTORY OF PRESENT ILLNESS: 1.  back pain  Left hip & buttock pain persist with position changes, and intermittently at rest.    Norco TID lately d/t increased pain Mobic 7.5mg  2/day   (X-rays last visit 04/17/13 after fall)     Medical/Surgical/Interim History Reviewed, no change.       Family History  (Reviewed, updated)   Relationship Family Member Name Deceased Age at Death Condition Onset Age Cause of Death         Family history of  Myocardial infarction   N         Family history of Diabetes mellitus type 1   N         Family history of Aplastic anemia   N         Family history of Diabetes mellitus type 2   N         Family history of Cardiovascular disease   N   SOCIAL HISTORY  (Reviewed, updated) Tobacco use reviewed. Preferred language is Unknown.  Smoking status: Never smoker.   SMOKING STATUS Use Status Type Smoking Status Usage Per Day Years Used Total Pack Years no/never   Never smoker                       MEDICATIONS(added, continued or stopped this visit):   Medication Dose Prescribed Else Ind Started Stopped ATORVASTATIN CALCIUM UNKNOWN Y     colestipol 1 gram tablet 1 gram N 04/17/2013   Cymbalta 60 mg capsule,delayed release 60 mg Y     Dexilant 60 mg capsule, delayed release 60 mg Y     enalapril maleate 20 mg tablet 20 mg Y     hydrochlorothiazide 25 mg tablet 25 mg Y     hydrocodone 5 mg-acetaminophen 325 mg tablet 5 mg-325 mg N 02/24/2013   Janumet XR 50 mg-1,000 mg tablet,extended release 50 mg-1,000 mg Y     meloxicam 7.5 mg tablet 7.5 mg Y           ALLERGIES:   Ingredient Reaction Medication Name Comment ACETAMINOPHEN Nausea PERCOCET   OXYCODONE HCL Nausea PERCOCET   Reviewed, no changes.     Vitals Date Temp F BP Pulse Ht In Wt Lb BMI BSA Pain Score 08/07/2013   141/83   65 210.4 35.01   9/10         DIAGNOSTIC RESULTS Lumbar radiographs demonstrate that her fusion at L4-L5 appears to be satisfactory.  There is some disc degeneration at the L5-S1 level and anterolisthesis of L3 on L4.       IMPRESSION As patient is having significant left buttock and leg pain, I have recommended that we obtain a new MRI of the lumbar spine.   Assessment/Plan # Detail Type Description 1. Assessment Lumbar disc degenerative disease (722.52).       2. Assessment Lumbago  (724.2).       3. Assessment Lumbar radiculopathy (724.4).       4. Assessment Lumbar spondylosis (721.3).           Pain Assessment/Treatment Pain Scale: 9/10. Method: Numeric Pain Intensity Scale. Pain Assessment/Treatment follow-up plan of care: Patient is taking Oxycodone 7.5-325mg  and Meloxicam 7.5mg  for pain and Rx's are not helping with pain.   I will see the patient back after MRI has been obtained.   Orders: Diagnostic Procedures: Assessment Procedure 724.4 MRI Spine/lumb With & W/o Contrast                Provider:  Marchia Meiers. Vertell Limber  08/21/2013 09:26 AM Dictation edited by: Marchia Meiers. Vertell Limber       OUTPATIENT OFFICE NOTE                                                     HOPI:                                                   Sakoya Win returns today. She fell three to four weeks ago on her buttocks and landed on hardwood floor when her chair slid out. She had a headache and neck pain for only one day, but has been complaining of lumbar burning sensation since then.    DATA:                                                  I obtained four-view lumbar spine radiographs which show well-positioned interbody graft and pedicle screw fixation without complicating features. Radiographs do show that she has some mild degenerative changes at L5-S1 and anterolisthesis of L3 on L4 with solid arthrodesis at previously operated levels.        PHYSICAL EXAMINATION:                    Her strength appears to be full on confrontational testing. I did not see significant structural abnormality.   IMPRESSION/PLAN:                             I reassured her that I did not see a significant problem. She is going to continue to use pain medication as she needs to and will plan on coming back to see me in the next couple of months to see what kind of progress she is making.                            _________________________________ Marchia Meiers. 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  She notes also bilateral hip and low back pain.  She says she has had numbness in her shoulders going to her fingers increasing lately but has been around for the last two years.  She also notes jaw numbness and pain, and arthritis with known TMJ dysfunction.  Injections are planned by an orthodontist.  She has had lumbar epidural steroid injections in 2008, 2009 and in 2010 for her low back.    She has been taking Mobic 7.5 mg. b.i.d., Cymbalta 60 mg. 2 per day which she says helps her back and hips.    REVIEW OF SYSTEMS:                                     A detailed Review of Systems sheet was reviewed with the patient.  Pertinent positives include glasses, glaucoma, cataracts, ringing in ears, balance disturbance, nasal congestion/drainage, mouth sores, high blood pressure, high cholesterol, leg pain while walking, liver disease, incontinence, arm weakness, leg weakness, back pain, arm pain, leg pain, joint pain or swelling, arthritis, neck pain, problems with memory and diabetes.    PAST MEDICAL HISTORY:                                         Current Medical Conditions:                               Significant for hypertension, gastroesophageal reflux disease, diabetes mellitus, uterine cancer for which she had surgery in 1980 which consisted of a radical hysterectomy.  Please see typed sheet for further details.            Prior Operations and Hospitalizations:            As previously described.  Please see typed sheet for further details.            Medications and Allergies:                                    Enalapril 20 mg. q.a.m., HCTZ 25 mg. q.d., Meloxicam 7.5 mg. b.i.d., Janumet 50/1000 2 q.d., Dexilant 60 mg. b.i.d., Pravastatin 40 mg. q.h.s., Cymbalta 60 mg. b.i.d., Alphagan 1 drop q.12.h., Cosopt Ocumeter plus 1 drop q.12.h.  ALLERGIC TO KIWI FRUIT, SUNFLOWER SEEDS, OXYCODONE EXTENDED RELEASE.             Height and Weight:                                                 5' 5.5" tall, 215 pounds.                                                   FAMILY HISTORY:  Mother died at age 24 of aplastic anemia.  Father died at age 48 of heart disease.                 SOCIAL HISTORY:                                                              She denies tobacco, alcohol or drug use.  Chauncey Reading. [place]                             #[id number]               DOB:  11/08/1945                               July 22, 2011 Page 2   DIAGNOSTIC STUDIES:                                   I reviewed an MRI of Mrs. Pardy' lumbar spine in 2008 in [place] which demonstrates significant spondylosis and stenosis with spondylolisthesis at the L4-5 level with severe spinal stenosis.  She also has degenerative disc disease at the L5-S1 level.  I also reviewed an MRI of her cervical spine which was performed through [facility] through [place] which demonstrates some disc bulges at C5-6 and C6-7 which contacts and mildly deforms the ventral cord with left worse than right foraminal narrowing at each level with uncovertebral disease and disc narrowing at C3-4 resulting in bilateral foraminal stenosis with some encroachment of both C4 nerve roots.  C4-5 level is fairly unremarkable and the spinal canal is not compressed at any level.    PHYSICAL EXAMINATION:                                       General Appearance:                                             On examination today, Mrs. Miles is a pleasant and cooperative, obese woman in no acute  distress.             Blood Pressure, Pulse, Respirations:               118/82, heart rate 76 and regular, respirations 18.                        HEENT - normocephalic, atraumatic.  The pupils are equal, round and reactive to light.  The extraocular muscles are intact.  Sclerae - white.  Conjunctiva - pink.  Oropharynx benign.  Uvula midline.            Neck - there are no masses, meningismus, deformities, tracheal deviation, jugular vein distention or carotid bruits.  There is normal cervical range of motion.  Spurlings' test is negative without reproducible radicular pain turning the patient's head to  either side.  Lhermitte's sign is not present with axial compression.  She does have some discomfort of the TMJ on the right as well as paravertebral musculature with some mild parascapular discomfort to palpation.             Respiratory - there is normal respiratory effort with good intercostal function.  Lungs are clear to auscultation.  There are no rales, rhonchi or wheezes.             Cardiovascular - the heart has regular rate and rhythm to auscultation.  No murmurs are appreciated.  There is no extremity edema, cyanosis or clubbing.  There are palpable pedal pulses.             Abdomen - soft, nontender, no hepatosplenomegaly appreciated or masses.  There are active bowel sounds.  No guarding or rebound.             Musculoskeletal Examination - she cannot stand for long periods of time.  She is able to bend to touch her toes and able to stand on her heels and toes.  She has a negative straight leg raise.    NEUROLOGICAL EXAMINATION:                               The patient is oriented to time, person and place and has good recall of both recent and remote memory with normal attention span and concentration.  The patient speaks with clear and fluent speech and exhibits normal language function and appropriate fund of knowledge.             Cranial Nerve Examination - pupils are  equal, round and reactive to light.  Extraocular movements are full.  Visual fields are full to confrontational testing.  Facial sensation and facial movement are Izela Houghton. [place]                             [id number]               DOB:  [birth year]                               July 22, 2011 Page 3   symmetric and intact.  Hearing is intact to finger rub.  Palate is upgoing.  Shoulder shrug is symmetric.  Tongue protrudes in the midline.             Motor Examination - motor strength is 5/5 in the bilateral deltoids, biceps, triceps, handgrips, wrist extensors, interosseous.  In the lower extremities motor strength is 5/5 in hip flexion, extension, quadriceps, hamstrings, plantar flexion, dorsiflexion and extensor hallucis longus.             Sensory Examination - normal to light touch and pinprick sensation in the upper and lower extremities.            Deep Tendon Reflexes - 2 in the biceps, triceps, and brachioradialis, 2 in the knees, 2 in the ankles.  The great toes are downgoing to plantar stimulation.  No pathologic reflexes.               Cerebellar Examination - normal coordination in upper and lower extremities and normal rapid alternating movements.  Romberg test is negative.    IMPRESSION AND RECOMMENDATIONS:  Jashley Cronkright is a 68 year old woman with neck pain.  She has spondylosis and multilevel foraminal stenosis without frank weakness.  I suggested a course of physical therapy for her neck discomfort including home traction to see if she does well with a home traction device.  I believe that her lumbar spinal stenosis and spondylolisthesis which was fairly marked in 2008 is still problematic for her.  I recommended we get an MRI and radiographs of her lumbar spine so that I may review this.  She also is complaining of midback pain and says she was told she had multiple disc protrusions in her thoracic spine as well and wanted to see what was happening with those.  We plan  on assessing that at the same time.  I will see her back after those studies have been done and she has a chance to engage in physical therapy.    [place] Vertell Limber, M.D.

## 2013-11-02 ENCOUNTER — Encounter (HOSPITAL_COMMUNITY): Payer: Self-pay | Admitting: *Deleted

## 2013-11-02 ENCOUNTER — Inpatient Hospital Stay (HOSPITAL_COMMUNITY): Payer: Medicare Other | Admitting: Anesthesiology

## 2013-11-02 ENCOUNTER — Encounter (HOSPITAL_COMMUNITY): Payer: Medicare Other | Admitting: Vascular Surgery

## 2013-11-02 ENCOUNTER — Inpatient Hospital Stay (HOSPITAL_COMMUNITY)
Admission: RE | Admit: 2013-11-02 | Discharge: 2013-11-04 | DRG: 460 | Disposition: A | Discharge status: Home / Self Care | Payer: Medicare Other | Source: Ambulatory Visit | Attending: Neurosurgery | Admitting: Neurosurgery

## 2013-11-02 ENCOUNTER — Inpatient Hospital Stay (HOSPITAL_COMMUNITY): Payer: Medicare Other

## 2013-11-02 ENCOUNTER — Encounter (HOSPITAL_COMMUNITY)
Admission: RE | Disposition: A | Discharge status: Home / Self Care | Payer: Medicaid Other | Source: Ambulatory Visit | Attending: Neurosurgery

## 2013-11-02 DIAGNOSIS — M48061 Spinal stenosis, lumbar region without neurogenic claudication: Secondary | ICD-10-CM | POA: Diagnosis not present

## 2013-11-02 DIAGNOSIS — M5137 Other intervertebral disc degeneration, lumbosacral region: Secondary | ICD-10-CM | POA: Diagnosis present

## 2013-11-02 DIAGNOSIS — M51379 Other intervertebral disc degeneration, lumbosacral region without mention of lumbar back pain or lower extremity pain: Secondary | ICD-10-CM | POA: Diagnosis present

## 2013-11-02 DIAGNOSIS — Z8249 Family history of ischemic heart disease and other diseases of the circulatory system: Secondary | ICD-10-CM

## 2013-11-02 DIAGNOSIS — I10 Essential (primary) hypertension: Secondary | ICD-10-CM | POA: Diagnosis present

## 2013-11-02 DIAGNOSIS — Z832 Family history of diseases of the blood and blood-forming organs and certain disorders involving the immune mechanism: Secondary | ICD-10-CM | POA: Diagnosis not present

## 2013-11-02 DIAGNOSIS — Z8542 Personal history of malignant neoplasm of other parts of uterus: Secondary | ICD-10-CM

## 2013-11-02 DIAGNOSIS — Z833 Family history of diabetes mellitus: Secondary | ICD-10-CM

## 2013-11-02 DIAGNOSIS — Z90710 Acquired absence of both cervix and uterus: Secondary | ICD-10-CM

## 2013-11-02 DIAGNOSIS — Q762 Congenital spondylolisthesis: Secondary | ICD-10-CM | POA: Diagnosis not present

## 2013-11-02 DIAGNOSIS — Z981 Arthrodesis status: Secondary | ICD-10-CM

## 2013-11-02 DIAGNOSIS — M4316 Spondylolisthesis, lumbar region: Secondary | ICD-10-CM | POA: Diagnosis present

## 2013-11-02 DIAGNOSIS — M4120 Other idiopathic scoliosis, site unspecified: Secondary | ICD-10-CM | POA: Diagnosis present

## 2013-11-02 DIAGNOSIS — M539 Dorsopathy, unspecified: Secondary | ICD-10-CM | POA: Diagnosis not present

## 2013-11-02 DIAGNOSIS — E119 Type 2 diabetes mellitus without complications: Secondary | ICD-10-CM | POA: Diagnosis present

## 2013-11-02 DIAGNOSIS — Z79899 Other long term (current) drug therapy: Secondary | ICD-10-CM | POA: Diagnosis not present

## 2013-11-02 DIAGNOSIS — Z886 Allergy status to analgesic agent status: Secondary | ICD-10-CM | POA: Diagnosis not present

## 2013-11-02 DIAGNOSIS — M47817 Spondylosis without myelopathy or radiculopathy, lumbosacral region: Secondary | ICD-10-CM | POA: Diagnosis present

## 2013-11-02 DIAGNOSIS — M4310 Spondylolisthesis, site unspecified: Secondary | ICD-10-CM | POA: Diagnosis not present

## 2013-11-02 DIAGNOSIS — M549 Dorsalgia, unspecified: Secondary | ICD-10-CM | POA: Diagnosis not present

## 2013-11-02 DIAGNOSIS — K219 Gastro-esophageal reflux disease without esophagitis: Secondary | ICD-10-CM | POA: Diagnosis present

## 2013-11-02 LAB — GLUCOSE, CAPILLARY
Glucose-Capillary: 217 mg/dL — ABNORMAL HIGH (ref 70–99)
Glucose-Capillary: 255 mg/dL — ABNORMAL HIGH (ref 70–99)
Glucose-Capillary: 280 mg/dL — ABNORMAL HIGH (ref 70–99)

## 2013-11-02 LAB — HEMOGLOBIN A1C
Hgb A1c MFr Bld: 8.3 % — ABNORMAL HIGH (ref <5.7)
Mean Plasma Glucose: 192 mg/dL — ABNORMAL HIGH (ref <117)

## 2013-11-02 SURGERY — POSTERIOR LUMBAR FUSION 1 LEVEL
Anesthesia: General | Site: Back

## 2013-11-02 MED ORDER — BUPIVACAINE HCL (PF) 0.5 % IJ SOLN
INTRAMUSCULAR | Status: DC | PRN
  Administered 2013-11-02: 20 mL
  Administered 2013-11-02: 10 mL

## 2013-11-02 MED ORDER — PHENOL 1.4 % MT LIQD: 1.0000 | OROMUCOSAL | Status: DC | PRN

## 2013-11-02 MED ORDER — MIDAZOLAM HCL 2 MG/2ML IJ SOLN
1.0000 mg | Freq: Once | INTRAMUSCULAR | Status: AC
  Administered 2013-11-02: 1 mg via INTRAVENOUS

## 2013-11-02 MED ORDER — LINAGLIPTIN 5 MG PO TABS
5.0000 mg | ORAL_TABLET | Freq: Every day | ORAL | Status: DC
  Administered 2013-11-03 – 2013-11-04 (×2): 5 mg via ORAL
  Filled 2013-11-02 (×3): qty 1

## 2013-11-02 MED ORDER — CEFAZOLIN SODIUM 1-5 GM-% IV SOLN
1.0000 g | Freq: Three times a day (TID) | INTRAVENOUS | Status: AC
  Administered 2013-11-02 – 2013-11-03 (×2): 1 g via INTRAVENOUS
  Filled 2013-11-02 (×2): qty 50

## 2013-11-02 MED ORDER — ESMOLOL HCL 10 MG/ML IV SOLN
INTRAVENOUS | Status: DC | PRN
  Administered 2013-11-02 (×2): 20 mg via INTRAVENOUS
  Administered 2013-11-02 (×2): 30 mg via INTRAVENOUS

## 2013-11-02 MED ORDER — LACTATED RINGERS IV SOLN
INTRAVENOUS | Status: DC | PRN
  Administered 2013-11-02 (×2): via INTRAVENOUS

## 2013-11-02 MED ORDER — DEXAMETHASONE SODIUM PHOSPHATE 4 MG/ML IJ SOLN
INTRAMUSCULAR | Status: DC | PRN
  Administered 2013-11-02: 8 mg via INTRAVENOUS

## 2013-11-02 MED ORDER — MIDAZOLAM HCL 5 MG/5ML IJ SOLN
INTRAMUSCULAR | Status: DC | PRN
  Administered 2013-11-02 (×2): 1 mg via INTRAVENOUS

## 2013-11-02 MED ORDER — TRIAMCINOLONE ACETONIDE 0.1 % EX CREA: 1.0000 "application " | TOPICAL_CREAM | Freq: Two times a day (BID) | CUTANEOUS | Status: DC | PRN

## 2013-11-02 MED ORDER — MORPHINE SULFATE 4 MG/ML IJ SOLN
INTRAMUSCULAR | Status: AC
  Filled 2013-11-02: qty 1

## 2013-11-02 MED ORDER — SODIUM CHLORIDE 0.9 % IV SOLN: 250.0000 mL | INTRAVENOUS | Status: DC

## 2013-11-02 MED ORDER — KCL IN DEXTROSE-NACL 20-5-0.45 MEQ/L-%-% IV SOLN
INTRAVENOUS | Status: DC
  Administered 2013-11-02: 17:00:00 via INTRAVENOUS
  Filled 2013-11-02 (×5): qty 1000

## 2013-11-02 MED ORDER — FENTANYL CITRATE 0.05 MG/ML IJ SOLN
INTRAMUSCULAR | Status: DC | PRN
  Administered 2013-11-02 (×2): 50 ug via INTRAVENOUS
  Administered 2013-11-02: 100 ug via INTRAVENOUS
  Administered 2013-11-02 (×3): 50 ug via INTRAVENOUS

## 2013-11-02 MED ORDER — HYDROCHLOROTHIAZIDE 25 MG PO TABS
25.0000 mg | ORAL_TABLET | Freq: Every day | ORAL | Status: DC
  Administered 2013-11-02 – 2013-11-03 (×2): 25 mg via ORAL
  Filled 2013-11-02 (×3): qty 1

## 2013-11-02 MED ORDER — MIDAZOLAM HCL 2 MG/2ML IJ SOLN
INTRAMUSCULAR | Status: AC
  Filled 2013-11-02: qty 2

## 2013-11-02 MED ORDER — EXENATIDE ER 2 MG ~~LOC~~ PEN: 2.0000 mg | PEN_INJECTOR | SUBCUTANEOUS | Status: DC

## 2013-11-02 MED ORDER — MORPHINE SULFATE 2 MG/ML IJ SOLN
1.0000 mg | INTRAMUSCULAR | Status: DC | PRN
  Administered 2013-11-02 (×2): 2 mg via INTRAVENOUS
  Administered 2013-11-02 – 2013-11-04 (×7): 4 mg via INTRAVENOUS
  Filled 2013-11-02 (×7): qty 2

## 2013-11-02 MED ORDER — NEOSTIGMINE METHYLSULFATE 1 MG/ML IJ SOLN
INTRAMUSCULAR | Status: DC | PRN
  Administered 2013-11-02: 4 mg via INTRAVENOUS

## 2013-11-02 MED ORDER — THROMBIN 20000 UNITS EX SOLR
CUTANEOUS | Status: DC | PRN
  Administered 2013-11-02: 07:00:00 via TOPICAL

## 2013-11-02 MED ORDER — METHOCARBAMOL 100 MG/ML IJ SOLN
500.0000 mg | INTRAVENOUS | Status: AC
  Administered 2013-11-02: 500 mg via INTRAVENOUS
  Filled 2013-11-02: qty 5

## 2013-11-02 MED ORDER — MENTHOL 3 MG MT LOZG: 1.0000 | LOZENGE | OROMUCOSAL | Status: DC | PRN

## 2013-11-02 MED ORDER — INSULIN ASPART 100 UNIT/ML ~~LOC~~ SOLN
0.0000 [IU] | Freq: Every day | SUBCUTANEOUS | Status: DC
  Administered 2013-11-02: 3 [IU] via SUBCUTANEOUS
  Administered 2013-11-03: 2 [IU] via SUBCUTANEOUS

## 2013-11-02 MED ORDER — GLYCOPYRROLATE 0.2 MG/ML IJ SOLN
INTRAMUSCULAR | Status: DC | PRN
  Administered 2013-11-02: 0.6 mg via INTRAVENOUS

## 2013-11-02 MED ORDER — DESONIDE 0.05 % EX CREA
1.0000 "application " | TOPICAL_CREAM | Freq: Two times a day (BID) | CUTANEOUS | Status: DC
  Administered 2013-11-02 – 2013-11-03 (×2): 1 via TOPICAL
  Filled 2013-11-02 (×18): qty 15

## 2013-11-02 MED ORDER — FENTANYL CITRATE 0.05 MG/ML IJ SOLN
INTRAMUSCULAR | Status: AC
  Filled 2013-11-02: qty 2

## 2013-11-02 MED ORDER — PROPOFOL 10 MG/ML IV BOLUS
INTRAVENOUS | Status: DC | PRN
  Administered 2013-11-02: 170 mg via INTRAVENOUS
  Administered 2013-11-02: 50 mg via INTRAVENOUS
  Administered 2013-11-02: 30 mg via INTRAVENOUS

## 2013-11-02 MED ORDER — FENTANYL CITRATE 0.05 MG/ML IJ SOLN
25.0000 ug | INTRAMUSCULAR | Status: DC | PRN
  Administered 2013-11-02 (×3): 50 ug via INTRAVENOUS

## 2013-11-02 MED ORDER — HYDROCODONE-ACETAMINOPHEN 7.5-325 MG PO TABS: 1.0000 | ORAL_TABLET | ORAL | Status: DC | PRN

## 2013-11-02 MED ORDER — METFORMIN HCL 500 MG PO TABS
1000.0000 mg | ORAL_TABLET | Freq: Two times a day (BID) | ORAL | Status: DC
  Administered 2013-11-02 – 2013-11-04 (×4): 1000 mg via ORAL
  Filled 2013-11-02 (×6): qty 2

## 2013-11-02 MED ORDER — ATORVASTATIN CALCIUM 10 MG PO TABS
10.0000 mg | ORAL_TABLET | Freq: Every day | ORAL | Status: DC
  Administered 2013-11-02 – 2013-11-03 (×2): 10 mg via ORAL
  Filled 2013-11-02 (×3): qty 1

## 2013-11-02 MED ORDER — PANTOPRAZOLE SODIUM 40 MG PO TBEC
40.0000 mg | DELAYED_RELEASE_TABLET | Freq: Every day | ORAL | Status: DC
  Administered 2013-11-03 – 2013-11-04 (×2): 40 mg via ORAL
  Filled 2013-11-02 (×2): qty 1

## 2013-11-02 MED ORDER — ONDANSETRON HCL 4 MG/2ML IJ SOLN: 4.0000 mg | INTRAMUSCULAR | Status: DC | PRN

## 2013-11-02 MED ORDER — SODIUM CHLORIDE 0.9 % IJ SOLN
3.0000 mL | Freq: Two times a day (BID) | INTRAMUSCULAR | Status: DC
  Administered 2013-11-02 – 2013-11-03 (×3): 3 mL via INTRAVENOUS

## 2013-11-02 MED ORDER — HYDROCODONE-ACETAMINOPHEN 5-325 MG PO TABS
ORAL_TABLET | ORAL | Status: AC
  Filled 2013-11-02: qty 2

## 2013-11-02 MED ORDER — LIDOCAINE-EPINEPHRINE 1 %-1:100000 IJ SOLN
INTRAMUSCULAR | Status: DC | PRN
  Administered 2013-11-02: 10 mL
  Administered 2013-11-02: 20 mL

## 2013-11-02 MED ORDER — METHOCARBAMOL 100 MG/ML IJ SOLN
500.0000 mg | Freq: Four times a day (QID) | INTRAMUSCULAR | Status: DC | PRN
  Filled 2013-11-02: qty 5

## 2013-11-02 MED ORDER — FENTANYL CITRATE 0.05 MG/ML IJ SOLN
50.0000 ug | Freq: Once | INTRAMUSCULAR | Status: AC
  Administered 2013-11-02: 50 ug via INTRAVENOUS

## 2013-11-02 MED ORDER — ACETAMINOPHEN 650 MG RE SUPP: 650.0000 mg | RECTAL | Status: DC | PRN

## 2013-11-02 MED ORDER — ZOLPIDEM TARTRATE 5 MG PO TABS: 5.0000 mg | ORAL_TABLET | Freq: Every evening | ORAL | Status: DC | PRN

## 2013-11-02 MED ORDER — OXYCODONE-ACETAMINOPHEN 5-325 MG PO TABS
1.0000 | ORAL_TABLET | ORAL | Status: DC | PRN
  Administered 2013-11-04: 2 via ORAL
  Filled 2013-11-02: qty 2
  Filled 2013-11-02: qty 1

## 2013-11-02 MED ORDER — PANTOPRAZOLE SODIUM 40 MG IV SOLR
40.0000 mg | Freq: Every day | INTRAVENOUS | Status: DC
  Administered 2013-11-02: 40 mg via INTRAVENOUS
  Filled 2013-11-02 (×2): qty 40

## 2013-11-02 MED ORDER — FENTANYL CITRATE 0.05 MG/ML IJ SOLN: 25.0000 ug | INTRAMUSCULAR | Status: DC | PRN

## 2013-11-02 MED ORDER — LIDOCAINE HCL (CARDIAC) 20 MG/ML IV SOLN
INTRAVENOUS | Status: DC | PRN
  Administered 2013-11-02: 100 mg via INTRAVENOUS

## 2013-11-02 MED ORDER — 0.9 % SODIUM CHLORIDE (POUR BTL) OPTIME
TOPICAL | Status: DC | PRN
  Administered 2013-11-02: 1000 mL

## 2013-11-02 MED ORDER — ENALAPRIL MALEATE 20 MG PO TABS
20.0000 mg | ORAL_TABLET | Freq: Every day | ORAL | Status: DC
Start: 2013-11-02 — End: 2013-11-04
  Administered 2013-11-02 – 2013-11-03 (×2): 20 mg via ORAL
  Filled 2013-11-02 (×3): qty 1

## 2013-11-02 MED ORDER — ALUM & MAG HYDROXIDE-SIMETH 200-200-20 MG/5ML PO SUSP: 30.0000 mL | Freq: Four times a day (QID) | ORAL | Status: DC | PRN

## 2013-11-02 MED ORDER — ONDANSETRON HCL 4 MG/2ML IJ SOLN
INTRAMUSCULAR | Status: DC | PRN
  Administered 2013-11-02: 4 mg via INTRAVENOUS

## 2013-11-02 MED ORDER — DULOXETINE HCL 60 MG PO CPEP
60.0000 mg | ORAL_CAPSULE | Freq: Two times a day (BID) | ORAL | Status: DC
  Administered 2013-11-02 – 2013-11-04 (×4): 60 mg via ORAL
  Filled 2013-11-02 (×5): qty 1

## 2013-11-02 MED ORDER — ARTIFICIAL TEARS OP OINT
TOPICAL_OINTMENT | OPHTHALMIC | Status: DC | PRN
  Administered 2013-11-02: 1 via OPHTHALMIC

## 2013-11-02 MED ORDER — SITAGLIPTIN PHOS-METFORMIN HCL 50-1000 MG PO TABS: 1.0000 | ORAL_TABLET | Freq: Two times a day (BID) | ORAL | Status: DC

## 2013-11-02 MED ORDER — INSULIN ASPART 100 UNIT/ML ~~LOC~~ SOLN
0.0000 [IU] | Freq: Three times a day (TID) | SUBCUTANEOUS | Status: DC
  Administered 2013-11-02: 8 [IU] via SUBCUTANEOUS
  Administered 2013-11-03: 3 [IU] via SUBCUTANEOUS
  Administered 2013-11-03: 5 [IU] via SUBCUTANEOUS
  Administered 2013-11-03 – 2013-11-04 (×2): 8 [IU] via SUBCUTANEOUS

## 2013-11-02 MED ORDER — ACETAMINOPHEN 325 MG PO TABS: 650.0000 mg | ORAL_TABLET | ORAL | Status: DC | PRN

## 2013-11-02 MED ORDER — SODIUM CHLORIDE 0.9 % IJ SOLN: 3.0000 mL | INTRAMUSCULAR | Status: DC | PRN

## 2013-11-02 MED ORDER — ROCURONIUM BROMIDE 100 MG/10ML IV SOLN
INTRAVENOUS | Status: DC | PRN
  Administered 2013-11-02: 20 mg via INTRAVENOUS
  Administered 2013-11-02: 50 mg via INTRAVENOUS

## 2013-11-02 MED ORDER — VITAMIN D3 25 MCG (1000 UNIT) PO TABS
1000.0000 [IU] | ORAL_TABLET | Freq: Every day | ORAL | Status: DC
  Administered 2013-11-02 – 2013-11-04 (×3): 1000 [IU] via ORAL
  Filled 2013-11-02 (×3): qty 1

## 2013-11-02 MED ORDER — MORPHINE SULFATE 2 MG/ML IJ SOLN
INTRAMUSCULAR | Status: AC
  Filled 2013-11-02: qty 2

## 2013-11-02 MED ORDER — HYDROCODONE-ACETAMINOPHEN 5-325 MG PO TABS
1.0000 | ORAL_TABLET | ORAL | Status: DC | PRN
  Administered 2013-11-02: 2 via ORAL
  Administered 2013-11-02: 1 via ORAL
  Administered 2013-11-02 – 2013-11-03 (×2): 2 via ORAL
  Administered 2013-11-03: 1 via ORAL
  Administered 2013-11-03 – 2013-11-04 (×3): 2 via ORAL
  Filled 2013-11-02: qty 1
  Filled 2013-11-02 (×2): qty 2
  Filled 2013-11-02: qty 1
  Filled 2013-11-02 (×3): qty 2

## 2013-11-02 MED ORDER — METHOCARBAMOL 500 MG PO TABS
500.0000 mg | ORAL_TABLET | Freq: Four times a day (QID) | ORAL | Status: DC | PRN
  Administered 2013-11-02 – 2013-11-04 (×5): 500 mg via ORAL
  Filled 2013-11-02 (×6): qty 1

## 2013-11-02 SURGICAL SUPPLY — 83 items
BAG DECANTER FOR FLEXI CONT (MISCELLANEOUS) ×3 IMPLANT
BENZOIN TINCTURE PRP APPL 2/3 (GAUZE/BANDAGES/DRESSINGS) ×3 IMPLANT
BLADE SURG ROTATE 9660 (MISCELLANEOUS) IMPLANT
BONE MATRIX OSTEOCEL PRO MED (Bone Implant) ×3 IMPLANT
BUR MATCHSTICK NEURO 3.0 LAGG (BURR) ×3 IMPLANT
BUR PRECISION FLUTE 5.0 (BURR) ×3 IMPLANT
CAGE COROENT PLIF 10X28-8 LUMB (Cage) ×6 IMPLANT
CANISTER SUCT 3000ML (MISCELLANEOUS) ×3 IMPLANT
CLOSURE WOUND 1/2 X4 (GAUZE/BANDAGES/DRESSINGS) ×1
CONT SPEC 4OZ CLIKSEAL STRL BL (MISCELLANEOUS) ×6 IMPLANT
COVER BACK TABLE 24X17X13 BIG (DRAPES) IMPLANT
COVER TABLE BACK 60X90 (DRAPES) ×3 IMPLANT
DERMABOND ADVANCED (GAUZE/BANDAGES/DRESSINGS) ×2
DERMABOND ADVANCED .7 DNX12 (GAUZE/BANDAGES/DRESSINGS) ×1 IMPLANT
DRAPE C-ARM 42X72 X-RAY (DRAPES) ×9 IMPLANT
DRAPE LAPAROTOMY 100X72X124 (DRAPES) ×3 IMPLANT
DRAPE POUCH INSTRU U-SHP 10X18 (DRAPES) ×3 IMPLANT
DRAPE SURG 17X23 STRL (DRAPES) ×3 IMPLANT
DRESSING TELFA 8X3 (GAUZE/BANDAGES/DRESSINGS) ×3 IMPLANT
DRSG OPSITE POSTOP 4X8 (GAUZE/BANDAGES/DRESSINGS) ×3 IMPLANT
DURAPREP 26ML APPLICATOR (WOUND CARE) ×3 IMPLANT
ELECT REM PT RETURN 9FT ADLT (ELECTROSURGICAL) ×3
ELECTRODE REM PT RTRN 9FT ADLT (ELECTROSURGICAL) ×1 IMPLANT
EVACUATOR 1/8 PVC DRAIN (DRAIN) ×3 IMPLANT
GAUZE SPONGE 4X4 16PLY XRAY LF (GAUZE/BANDAGES/DRESSINGS) IMPLANT
GLOVE BIO SURGEON STRL SZ8 (GLOVE) ×6 IMPLANT
GLOVE BIOGEL PI IND STRL 8 (GLOVE) ×2 IMPLANT
GLOVE BIOGEL PI IND STRL 8.5 (GLOVE) ×2 IMPLANT
GLOVE BIOGEL PI INDICATOR 8 (GLOVE) ×4
GLOVE BIOGEL PI INDICATOR 8.5 (GLOVE) ×4
GLOVE ECLIPSE 7.5 STRL STRAW (GLOVE) ×3 IMPLANT
GLOVE ECLIPSE 8.0 STRL XLNG CF (GLOVE) ×6 IMPLANT
GLOVE EXAM NITRILE LRG STRL (GLOVE) ×6 IMPLANT
GLOVE EXAM NITRILE MD LF STRL (GLOVE) IMPLANT
GLOVE EXAM NITRILE XL STR (GLOVE) IMPLANT
GLOVE EXAM NITRILE XS STR PU (GLOVE) IMPLANT
GLOVE INDICATOR 7.5 STRL GRN (GLOVE) ×3 IMPLANT
GLOVE OPTIFIT SS 6.5 STRL BRWN (GLOVE) ×3 IMPLANT
GLOVE SURG SS PI 7.0 STRL IVOR (GLOVE) ×6 IMPLANT
GOWN BRE IMP SLV AUR LG STRL (GOWN DISPOSABLE) IMPLANT
GOWN BRE IMP SLV AUR XL STRL (GOWN DISPOSABLE) ×6 IMPLANT
GOWN STRL REIN 2XL LVL4 (GOWN DISPOSABLE) ×6 IMPLANT
GOWN STRL REUS W/ TWL LRG LVL3 (GOWN DISPOSABLE) ×2 IMPLANT
GOWN STRL REUS W/ TWL XL LVL3 (GOWN DISPOSABLE) ×2 IMPLANT
GOWN STRL REUS W/TWL 2XL LVL3 (GOWN DISPOSABLE) ×6 IMPLANT
GOWN STRL REUS W/TWL LRG LVL3 (GOWN DISPOSABLE) ×4
GOWN STRL REUS W/TWL XL LVL3 (GOWN DISPOSABLE) ×4
KIT BASIN OR (CUSTOM PROCEDURE TRAY) ×3 IMPLANT
KIT POSITION SURG JACKSON T1 (MISCELLANEOUS) ×3 IMPLANT
KIT ROOM TURNOVER OR (KITS) ×3 IMPLANT
MILL MEDIUM DISP (BLADE) ×3 IMPLANT
NEEDLE HYPO 25X1 1.5 SAFETY (NEEDLE) ×3 IMPLANT
NEEDLE SPNL 18GX3.5 QUINCKE PK (NEEDLE) IMPLANT
NS IRRIG 1000ML POUR BTL (IV SOLUTION) ×3 IMPLANT
PACK LAMINECTOMY NEURO (CUSTOM PROCEDURE TRAY) ×3 IMPLANT
PAD ARMBOARD 7.5X6 YLW CONV (MISCELLANEOUS) ×9 IMPLANT
PATTIES SURGICAL .5 X.5 (GAUZE/BANDAGES/DRESSINGS) IMPLANT
PATTIES SURGICAL .5 X1 (DISPOSABLE) IMPLANT
PATTIES SURGICAL 1X1 (DISPOSABLE) IMPLANT
ROD PREBENT ARMT15T 45MM (Rod) ×3 IMPLANT
ROD REBENT 35MM (Rod) ×3 IMPLANT
SCREW ARMADA 6.5X50 (Screw) ×6 IMPLANT
SCREW LOCK (Screw) ×8 IMPLANT
SCREW LOCK 100X5.5X OPN (Screw) ×4 IMPLANT
SCREW POLY 45X6.5 (Screw) ×2 IMPLANT
SCREW POLY 6.5X45MM (Screw) ×4 IMPLANT
SPONGE GAUZE 4X4 12PLY (GAUZE/BANDAGES/DRESSINGS) ×3 IMPLANT
SPONGE LAP 4X18 X RAY DECT (DISPOSABLE) IMPLANT
SPONGE SURGIFOAM ABS GEL 100 (HEMOSTASIS) ×3 IMPLANT
STAPLER SKIN PROX WIDE 3.9 (STAPLE) IMPLANT
STRIP CLOSURE SKIN 1/2X4 (GAUZE/BANDAGES/DRESSINGS) ×2 IMPLANT
SUT VIC AB 1 CT1 18XBRD ANBCTR (SUTURE) ×2 IMPLANT
SUT VIC AB 1 CT1 8-18 (SUTURE) ×4
SUT VIC AB 2-0 CT1 18 (SUTURE) ×6 IMPLANT
SUT VIC AB 3-0 SH 8-18 (SUTURE) ×6 IMPLANT
SYR 20ML ECCENTRIC (SYRINGE) ×3 IMPLANT
SYR 3ML LL SCALE MARK (SYRINGE) IMPLANT
SYR 5ML LL (SYRINGE) IMPLANT
TOWEL OR 17X24 6PK STRL BLUE (TOWEL DISPOSABLE) ×3 IMPLANT
TOWEL OR 17X26 10 PK STRL BLUE (TOWEL DISPOSABLE) ×3 IMPLANT
TRAP SPECIMEN MUCOUS 40CC (MISCELLANEOUS) ×3 IMPLANT
TRAY FOLEY CATH 14FRSI W/METER (CATHETERS) ×3 IMPLANT
WATER STERILE IRR 1000ML POUR (IV SOLUTION) ×3 IMPLANT

## 2013-11-02 NOTE — Progress Notes (Signed)
Utilization review completed.  

## 2013-11-02 NOTE — Progress Notes (Signed)
Utilization review completed. Areliz Rothman, RN, BSN. 

## 2013-11-02 NOTE — Anesthesia Postprocedure Evaluation (Signed)
  Anesthesia Post-op Note  Patient: Robyn Hill  Procedure(s) Performed: Procedure(s): POSTERIOR LUMBAR INTERBODY FUSION LUMBAR THREE-FOUR EXPLORATION AND EXTENSION OF FUSION LUMBAR FOUR-FIVE WITH HARDWARE REMOVAL (N/A)  Patient Location: PACU  Anesthesia Type:General  Level of Consciousness: awake  Airway and Oxygen Therapy: Patient Spontanous Breathing  Post-op Pain: mild  Post-op Assessment: Post-op Vital signs reviewed  Post-op Vital Signs: Reviewed  Complications: No apparent anesthesia complications

## 2013-11-02 NOTE — Progress Notes (Signed)
Dr Oletta Lamas aware pt continues to cry in pain.  Order received to give versed 1 mg

## 2013-11-02 NOTE — Anesthesia Procedure Notes (Signed)
Procedure Name: Intubation Date/Time: 11/02/2013 7:38 AM Performed by: Wanita Chamberlain Pre-anesthesia Checklist: Patient identified, Timeout performed, Emergency Drugs available, Suction available and Patient being monitored Patient Re-evaluated:Patient Re-evaluated prior to inductionOxygen Delivery Method: Circle system utilized Preoxygenation: Pre-oxygenation with 100% oxygen Intubation Type: IV induction Ventilation: Mask ventilation without difficulty Laryngoscope Size: Mac and 3 Grade View: Grade I Tube type: Oral Tube size: 7.0 mm Number of attempts: 1 Airway Equipment and Method: Stylet Placement Confirmation: ETT inserted through vocal cords under direct vision,  breath sounds checked- equal and bilateral,  positive ETCO2 and CO2 detector Secured at: 22 cm Tube secured with: Tape Dental Injury: Teeth and Oropharynx as per pre-operative assessment

## 2013-11-02 NOTE — Transfer of Care (Signed)
Immediate Anesthesia Transfer of Care Note  Patient: Robyn Hill  Procedure(s) Performed: Procedure(s): POSTERIOR LUMBAR INTERBODY FUSION LUMBAR THREE-FOUR EXPLORATION AND EXTENSION OF FUSION LUMBAR FOUR-FIVE WITH HARDWARE REMOVAL (N/A)  Patient Location: PACU  Anesthesia Type:General  Level of Consciousness: awake, alert , oriented and patient cooperative  Airway & Oxygen Therapy: Patient Spontanous Breathing and Patient connected to nasal cannula oxygen  Post-op Assessment: Report given to PACU RN and Post -op Vital signs reviewed and stable  Post vital signs: Reviewed and stable  Complications: No apparent anesthesia complications

## 2013-11-02 NOTE — Interval H&P Note (Signed)
History and Physical Interval Note:  11/02/2013 7:26 AM  Robyn Hill  has presented today for surgery, with the diagnosis of Spondylolisthesis, Stenosis, Degenerative disc disease, spondylosis  The various methods of treatment have been discussed with the patient and family. After consideration of risks, benefits and other options for treatment, the patient has consented to  Procedure(s) with comments: POSTERIOR LUMBAR FUSION 1 LEVEL (N/A) - Exploration and extension of fusion (L4-5) with Posterior lumbar interbody fusion at L3-4 as a surgical intervention .  The patient's history has been reviewed, patient examined, no change in status, stable for surgery.  I have reviewed the patient's chart and labs.  Questions were answered to the patient's satisfaction.     Pranathi Winfree D

## 2013-11-02 NOTE — Preoperative (Signed)
Beta Blockers   Reason not to administer Beta Blockers:Not Applicable 

## 2013-11-02 NOTE — Progress Notes (Signed)
Awake, alert, conversant.  Complaining of incisional pain.  Strength full in both legs.  Doing well .

## 2013-11-02 NOTE — Anesthesia Preprocedure Evaluation (Addendum)
Anesthesia Evaluation  Patient identified by MRN, date of birth, ID band Patient awake    Reviewed: Allergy & Precautions, H&P , NPO status   Airway Mallampati: II      Dental  (+) Teeth Intact and Dental Advisory Given,    Pulmonary asthma ,  breath sounds clear to auscultation  Pulmonary exam normal       Cardiovascular hypertension, Pt. on medications Rhythm:Regular Rate:Normal  23-Oct-2013 10:33:39 [facility] ROUTINE RECORD Normal sinus rhythm Normal ECG   Neuro/Psych  Headaches,    GI/Hepatic Neg liver ROS, hiatal hernia, GERD-  Medicated and Controlled,  Endo/Other  diabetes, Poorly Controlled, Type 2, Oral Hypoglycemic AgentsHypothyroidism   Renal/GU Renal disease     Musculoskeletal   Abdominal   Peds  Hematology negative hematology ROS (+)   Anesthesia Other Findings Blind OD w/ limited vision OS  Reproductive/Obstetrics                       Anesthesia Physical Anesthesia Plan  ASA: III  Anesthesia Plan: General   Post-op Pain Management:    Induction: Intravenous  Airway Management Planned: Oral ETT  Additional Equipment:   Intra-op Plan:   Post-operative Plan: Extubation in OR  Informed Consent: I have reviewed the patients History and Physical, chart, labs and discussed the procedure including the risks, benefits and alternatives for the proposed anesthesia with the patient or authorized representative who has indicated his/her understanding and acceptance.   Dental advisory given  Plan Discussed with: CRNA and Anesthesiologist  Anesthesia Plan Comments:         Anesthesia Quick Evaluation

## 2013-11-02 NOTE — Brief Op Note (Signed)
11/02/2013  10:48 AM  PATIENT:  Robyn Hill  68 y.o. female  PRE-OPERATIVE DIAGNOSIS:  Spondylolisthesis, Stenosis, Degenerative disc disease, spondylosis, scoliosis L 34 level with low back pain  POST-OPERATIVE DIAGNOSIS:  Spondylolisthesis, Stenosis, Degenerative disc disease, spondylosis, scoliosis L 34 level with low back pain  PROCEDURE:  Procedure(s): POSTERIOR LUMBAR INTERBODY FUSION LUMBAR THREE-FOUR EXPLORATION AND EXTENSION OF FUSION LUMBAR FOUR-FIVE WITH HARDWARE REMOVAL (N/A), exploration of fusion L 45 with decompression greater than in typical PLIF surgery with pedicle screw fixation and posterolateral arthhrodesis  SURGEON:  Surgeon(s) and Role:    * Erline Levine, MD - Primary    * Otilio Connors, MD - Assisting  PHYSICIAN ASSISTANT:   ASSISTANTS: Poteat, RN   ANESTHESIA:   general  EBL:  Total I/O In: 1000 [I.V.:1000] Out: 150 [Urine:150]  BLOOD ADMINISTERED:none  DRAINS: (Medium) Hemovact drain(s) in the epidural space with  Suction Open   LOCAL MEDICATIONS USED:  LIDOCAINE   SPECIMEN:  No Specimen  DISPOSITION OF SPECIMEN:  N/A  COUNTS:  YES  TOURNIQUET:  * No tourniquets in log *  DICTATION: Patient is 68 year old morbidly obese woman with prior fusion at L4/5 with lumbar scoliosis and spondylolisthesis of L3/4 level with scoliosis affecting left greater than right leg. It was elected to take her to surgery for exploration of prior fusion with decompression and fusion at the L3/4 level.   Procedure: Patient was placed in a prone position on the Gilmer table after smooth and uncomplicated induction of general endotracheal anesthesia. Her low back was prepped and draped in usual sterile fashion with betadine scrub and DuraPrep. Area of incision was infiltrated with local lidocaine. Incision was made to the lumbodorsal fascia was incised and exposure was performed of the previously placed hardware, which was removed.  There did not appear to be any  loosening of screws and after careful exploration, there appeared to be good bridging bone growth with no mobility at the previously operated level. Intraoperative x-ray was obtained which confirmed correct orientation with marker probes at the L4 pedicle and overlying the L3 transverse process. A total left  laminectomy of L3 was performed with disarticulation of the facet joints at this level and thorough decompression was performed of both L3 and L4 nerve roots along with the common dural tube. Decompression was painstakingly carried through scar tissue and was greater than typically performed for standard PLIF surgery. A thorough discectomy was initially performed on the left with preparation of the endplates for grafting a trial spacer was placed this level and a thorough discectomy was performed on the right as well. 18 cc of bone autograft was packed within the interspace  along with 5 cc Osteocel plus bone graft extender. A 10 mm 8 degree lordotic PEEK PLIF cage was  packed with autograft and extender and was inserted the interspace and countersunk appropriately on the right and a similarly sized cage was placed on the left. The posterolateral region was extensively decorticated and pedicle probes were placed at L3 and 6.5 x 45 mm screws were placed at L4 bilaterally. Intraoperative fluoroscopy confirmed correct orientationin the AP and lateral plane. 50 x 6.5 mm pedicle screws were placed at L3 bilaterally. The right L 3 screw had a lateral cutout and was repositioned medially. Final x-rays demonstrated well-positioned interbody graft and pedicle screw fixation. A  35 mm lordotic rod was placed on the left and a 45 mm rod was placed on the right locked down in situ and the posterolateral  region was packed with the remaining 10 cc of autograft and bone graft extender on the right.  Fascia was closed with 1 Vicryl sutures skin edges were reapproximated 2 and 3-0 Vicryl sutures. The wound is dressed with  Dermabond and an occlusive dressing.   The patient was extubated in the operating room and taken to recovery in stable satisfactory condition. Counts were correct at the end of the case.     PLAN OF CARE: Admit to inpatient   PATIENT DISPOSITION:  PACU - hemodynamically stable.   Delay start of Pharmacological VTE agent (>24hrs) due to surgical blood loss or risk of bleeding: yes

## 2013-11-02 NOTE — Op Note (Signed)
11/02/2013  10:48 AM  PATIENT:  Robyn Hill  68 y.o. female  PRE-OPERATIVE DIAGNOSIS:  Spondylolisthesis, Stenosis, Degenerative disc disease, spondylosis, scoliosis L 34 level with low back pain  POST-OPERATIVE DIAGNOSIS:  Spondylolisthesis, Stenosis, Degenerative disc disease, spondylosis, scoliosis L 34 level with low back pain  PROCEDURE:  Procedure(s): POSTERIOR LUMBAR INTERBODY FUSION LUMBAR THREE-FOUR EXPLORATION AND EXTENSION OF FUSION LUMBAR FOUR-FIVE WITH HARDWARE REMOVAL (N/A), exploration of fusion L 45 with decompression greater than in typical PLIF surgery with pedicle screw fixation and posterolateral arthhrodesis  SURGEON:  Surgeon(s) and Role:    * Latasha Buczkowski, MD - Primary    * James R Hirsch, MD - Assisting  PHYSICIAN ASSISTANT:   ASSISTANTS: Poteat, RN   ANESTHESIA:   general  EBL:  Total I/O In: 1000 [I.V.:1000] Out: 150 [Urine:150]  BLOOD ADMINISTERED:none  DRAINS: (Medium) Hemovact drain(s) in the epidural space with  Suction Open   LOCAL MEDICATIONS USED:  LIDOCAINE   SPECIMEN:  No Specimen  DISPOSITION OF SPECIMEN:  N/A  COUNTS:  YES  TOURNIQUET:  * No tourniquets in log *  DICTATION: Patient is 68-year-old morbidly obese woman with prior fusion at L4/5 with lumbar scoliosis and spondylolisthesis of L3/4 level with scoliosis affecting left greater than right leg. It was elected to take her to surgery for exploration of prior fusion with decompression and fusion at the L3/4 level.   Procedure: Patient was placed in a prone position on the Jackson table after smooth and uncomplicated induction of general endotracheal anesthesia. Her low back was prepped and draped in usual sterile fashion with betadine scrub and DuraPrep. Area of incision was infiltrated with local lidocaine. Incision was made to the lumbodorsal fascia was incised and exposure was performed of the previously placed hardware, which was removed.  There did not appear to be any  loosening of screws and after careful exploration, there appeared to be good bridging bone growth with no mobility at the previously operated level. Intraoperative x-ray was obtained which confirmed correct orientation with marker probes at the L4 pedicle and overlying the L3 transverse process. A total left  laminectomy of L3 was performed with disarticulation of the facet joints at this level and thorough decompression was performed of both L3 and L4 nerve roots along with the common dural tube. Decompression was painstakingly carried through scar tissue and was greater than typically performed for standard PLIF surgery. A thorough discectomy was initially performed on the left with preparation of the endplates for grafting a trial spacer was placed this level and a thorough discectomy was performed on the right as well. 18 cc of bone autograft was packed within the interspace  along with 5 cc Osteocel plus bone graft extender. A 10 mm 8 degree lordotic PEEK PLIF cage was  packed with autograft and extender and was inserted the interspace and countersunk appropriately on the right and a similarly sized cage was placed on the left. The posterolateral region was extensively decorticated and pedicle probes were placed at L3 and 6.5 x 45 mm screws were placed at L4 bilaterally. Intraoperative fluoroscopy confirmed correct orientationin the AP and lateral plane. 50 x 6.5 mm pedicle screws were placed at L3 bilaterally. The right L 3 screw had a lateral cutout and was repositioned medially. Final x-rays demonstrated well-positioned interbody graft and pedicle screw fixation. A  35 mm lordotic rod was placed on the left and a 45 mm rod was placed on the right locked down in situ and the posterolateral   region was packed with the remaining 10 cc of autograft and bone graft extender on the right.  Fascia was closed with 1 Vicryl sutures skin edges were reapproximated 2 and 3-0 Vicryl sutures. The wound is dressed with  Dermabond and an occlusive dressing.   The patient was extubated in the operating room and taken to recovery in stable satisfactory condition. Counts were correct at the end of the case.     PLAN OF CARE: Admit to inpatient   PATIENT DISPOSITION:  PACU - hemodynamically stable.   Delay start of Pharmacological VTE agent (>24hrs) due to surgical blood loss or risk of bleeding: yes  

## 2013-11-02 NOTE — Progress Notes (Signed)
Patient ID: Robyn Hill, female   DOB: 1946-02-27, 68 y.o.   MRN: [id number]  Alert, conversant, husband at bedside. Pain controlled. Good strength BLE. drsg intact, dry.  Verdis Prime, RN, BSN

## 2013-11-02 NOTE — Progress Notes (Signed)
Crna at bedside and unable to flush IV.  She will start a new peripheral Iv.

## 2013-11-03 LAB — GLUCOSE, CAPILLARY
Glucose-Capillary: 213 mg/dL — ABNORMAL HIGH (ref 70–99)
Glucose-Capillary: 295 mg/dL — ABNORMAL HIGH (ref 70–99)
Glucose-Capillary: 154 mg/dL — ABNORMAL HIGH (ref 70–99)

## 2013-11-03 NOTE — Progress Notes (Signed)
CBGs on 1/15: 217-255-280 mg/dl       1/16: 213-295 mg/dl Recommend starting Lantus 15 units daily if CBGs continue greater than 180 mg/dl, especially while on steroids.  Harvel Ricks RN BSN CDE

## 2013-11-03 NOTE — Progress Notes (Signed)
Removed foley catheter 7353. Pt tolerated well. Pt ambulated half way down hall of unit and back to room. Pt sitting in chair. Pt tolerated walking very well. Will continue to monitor.

## 2013-11-03 NOTE — Evaluation (Signed)
Occupational Therapy Evaluation Patient Details Name: Robyn Hill MRN: [id number] DOB: December 28, 1945 Today's Date: 11/03/2013 Time: 1031-1050 OT Time Calculation (min): 19 min  OT Assessment / Plan / Recommendation History of present illness 68 yo female s/p PLIF L3-4 and hardware removal L4-5    Clinical Impression   Patient evaluated by Occupational Therapy with no further acute OT needs identified. All education has been completed and the patient has no further questions. See below for any follow-up Occupational Therapy or equipment needs. OT to sign off. Thank you for referral.      OT Assessment  Patient does not need any further OT services    Follow Up Recommendations  No OT follow up    Barriers to Discharge      Equipment Recommendations  None recommended by OT    Recommendations for Other Services    Frequency       Precautions / Restrictions Precautions Precautions: Back Precaution Booklet Issued: Yes (comment) Precaution Comments: back precautions stated 3 out 3 and handout present in room for adls Required Braces or Orthoses: Spinal Brace Spinal Brace: Lumbar corset;Applied in sitting position Restrictions Weight Bearing Restrictions: No   Pertinent Vitals/Pain Minimal and premedicated    ADL  Eating/Feeding: Independent Where Assessed - Eating/Feeding: Chair Grooming: Wash/dry hands;Modified independent Where Assessed - Grooming: Unsupported standing Lower Body Dressing:  (has husband (A) at baseline) Where Assessed - Lower Body Dressing: Unsupported sit to stand Toilet Transfer: Modified independent Toilet Transfer Method: Sit to Loss adjuster, chartered: Regular height toilet Toileting - Clothing Manipulation and Hygiene: Modified independent Where Assessed - Toileting Clothing Manipulation and Hygiene: Sit to stand from 3-in-1 or toilet Tub/Shower Transfer: Supervision/safety Tub/Shower Transfer Method: Ambulating Equipment Used: Back  brace;Rolling walker Transfers/Ambulation Related to ADLs: Pt ambulating with RW mod I ADL Comments: Pt recalling back precautions, complete toilet transfer, tub transfer, bed mobility and discussed LB dressing. pt does not require further acute Ot services    OT Diagnosis:    OT Problem List:   OT Treatment Interventions:     OT Goals(Current goals can be found in the care plan section) Acute Rehab OT Goals Patient Stated Goal: to return home with husband so he can take care of her  Visit Information  Last OT Received On: 11/03/13 Assistance Needed: +1 History of Present Illness: 68 yo female s/p        Prior [place] expects to be discharged to:: Private residence Living Arrangements: Spouse/significant other Available Help at Discharge: Family;Available 24 hours/day Type of Home: House Home Access: Ramped entrance Home Layout: One level Home Equipment: Walker - 2 wheels;Bedside commode Additional Comments: pt to have husband double check that she has a RW and not a SW.   Prior Function Level of Independence: Independent Communication Communication: No difficulties Dominant Hand: Right         Vision/Perception Vision - History Baseline Vision: Legally blind Patient Visual Report: No change from baseline Vision - Assessment Additional Comments: pt at baseline legally blind in Rt eye and Lt eye with Rt visual peripheral deficits   Cognition  Cognition Arousal/Alertness: Awake/alert Behavior During Therapy: WFL for tasks assessed/performed Overall Cognitive Status: Within Functional Limits for tasks assessed    Extremity/Trunk Assessment Upper Extremity Assessment Upper Extremity Assessment: Overall WFL for tasks assessed Lower Extremity Assessment Lower Extremity Assessment: Defer to PT evaluation     Mobility Bed Mobility Overal bed mobility: Independent General bed mobility comments: pt demos  good mobility returning to  bed.   Transfers Overall transfer level: Modified independent Equipment used: Rolling walker (2 wheeled) General transfer comment: pt demos good use of UEs and controls descent to sitting.       Exercise     Balance     End of Session OT - End of Session Activity Tolerance: Patient tolerated treatment well Patient left: in chair;with call bell/phone within reach Nurse Communication: Mobility status;Precautions  GO     Peri Maris 11/03/2013, 2:28 PM Pager: [phone]

## 2013-11-03 NOTE — Evaluation (Signed)
Physical Therapy Evaluation Patient Details Name: Robyn Hill MRN: [id number] DOB: 10-24-1945 Today's Date: 11/03/2013 Time: 2751-7001 PT Time Calculation (min): 29 min  PT Assessment / Plan / Recommendation History of Present Illness  68 yo female s/p   Clinical Impression  Pt demos good mobility and follows back precautions well with mobility.  Will continue to follow pt to ensure safety for D/C to home.      PT Assessment  Patient needs continued PT services    Follow Up Recommendations  Home health PT;Supervision - Intermittent    Does the patient have the potential to tolerate intense rehabilitation      Barriers to Discharge        Equipment Recommendations  None recommended by PT    Recommendations for Other Services     Frequency Min 5X/week    Precautions / Restrictions Precautions Precautions: Back Precaution Booklet Issued: Yes (comment) Precaution Comments: back precautions stated 3 out 3 and handout present in room for adls Required Braces or Orthoses: Spinal Brace Spinal Brace: Lumbar corset;Applied in sitting position Restrictions Weight Bearing Restrictions: No   Pertinent Vitals/Pain Around incision 6/10.  Pt rang for RN.        Mobility  Bed Mobility Overal bed mobility: Independent General bed mobility comments: pt demos good mobility returning to bed.   Transfers Overall transfer level: Modified independent Equipment used: Rolling walker (2 wheeled) General transfer comment: pt demos good use of UEs and controls descent to sitting.   Ambulation/Gait Ambulation/Gait assistance: Min guard Ambulation Distance (Feet): 160 Feet Assistive device: Rolling walker (2 wheeled) Gait Pattern/deviations: Step-through pattern;Decreased stride length General Gait Details: pt moving well despite c/o pain around incision.  pt demos good use of RW and follows back precautions during ambulation.      Exercises     PT Diagnosis: Difficulty walking;Acute  pain  PT Problem List: Decreased activity tolerance;Decreased balance;Decreased mobility;Decreased knowledge of use of DME;Decreased knowledge of precautions;Pain PT Treatment Interventions: DME instruction;Gait training;Functional mobility training;Therapeutic activities;Therapeutic exercise;Balance training;Neuromuscular re-education;Patient/family education     PT Goals(Current goals can be found in the care plan section) Acute Rehab PT Goals Patient Stated Goal: Walk better PT Goal Formulation: With patient Time For Goal Achievement: 11/10/13 Potential to Achieve Goals: Good  Visit Information  Last PT Received On: 11/03/13 Assistance Needed: +1 History of Present Illness: 68 yo female s/p        Prior [place] expects to be discharged to:: Private residence Living Arrangements: Spouse/significant other Available Help at Discharge: Family;Available 24 hours/day Type of Home: House Home Access: Ramped entrance Home Layout: One level Home Equipment: Walker - 2 wheels;Bedside commode Additional Comments: pt to have husband double check that she has a RW and not a SW.   Prior Function Level of Independence: Independent Communication Communication: No difficulties Dominant Hand: Right    Cognition  Cognition Arousal/Alertness: Awake/alert Behavior During Therapy: WFL for tasks assessed/performed Overall Cognitive Status: Within Functional Limits for tasks assessed    Extremity/Trunk Assessment Lower Extremity Assessment Lower Extremity Assessment: Overall WFL for tasks assessed   Balance    End of Session PT - End of Session Equipment Utilized During Treatment: Gait belt;Back brace Activity Tolerance: Patient tolerated treatment well Patient left: in bed;with call bell/phone within reach Nurse Communication: Mobility status  GP     Catarina Hartshorn, [place] 11/03/2013, 11:01 AM

## 2013-11-03 NOTE — Progress Notes (Signed)
Subjective: Patient reports "I'm doing fine."  Objective: Vital signs in last 24 hours: Temp:  [97.1 F (36.2 C)-98.1 F (36.7 C)] 98.1 F (36.7 C) (01/16 0514) Pulse Rate:  [82-112] 82 (01/16 0514) Resp:  [8-36] 20 (01/16 0514) BP: (118-174)/(40-120) 139/58 mmHg (01/16 0514) SpO2:  [91 %-98 %] 96 % (01/16 0514)  Intake/Output from previous day: 01/15 0701 - 01/16 0700 In: 2060 [P.O.:360; I.V.:1700] Out: 3299 [Urine:6300; Drains:115; Blood:100] Intake/Output this shift:    Alert, conversant, reporting only lumbar soreness. Hemovac patent. Drsg intact. Good strength BLE. No report of leg or buttock pain.   Lab Results: No results found for this basename: WBC, HGB, HCT, PLT,  in the last 72 hours BMET No results found for this basename: NA, K, CL, CO2, GLUCOSE, BUN, CREATININE, CALCIUM,  in the last 72 hours  Studies/Results: Dg Lumbar Spine 2-3 Views  11/02/2013   CLINICAL DATA:  Posterior lumbar fusion.  EXAM: LUMBAR SPINE - 2-3 VIEW; DG C-ARM 1-60 MIN  COMPARISON:  Lumbar spine radiograph November 02, 2013 at 8:29 a.m.  FINDINGS: Two fluoroscopic spot views of the lower lumbar spine submitted, radiologist was not present at time of image acquisition. L3 and L4 pedicle screws in place with posterior surgical hardware. L3-4 and L4-5 interbody disc material.  Severe L5-S1 degenerative disc disease suspected.  IMPRESSION: L3 and L4 pedicle screws with interbody fusion material at L3-4 and L4-5.   Electronically Signed   By: Elon Alas   On: 11/02/2013 14:06   Dg Lumbar Spine 1 View  11/02/2013   CLINICAL DATA:  PLIF 3-4 EXPLORATION 4-5  EXAM: LUMBAR SPINE - 1 VIEW  COMPARISON:  MR LUMBAR SPINE WO/W CM dated 08/22/2013  FINDINGS: Single lateral cross-table view of the lumbar spine provided.  There is interbody fusion at L4-5. There is degenerative disc disease at L5-S1. There are 2 metallic probes posteriorly. The more superior metallic probe projects over the L3 posterior element.  The more inferior metallic probe projects over the L4 vertebral body.  IMPRESSION: There is hazy right lower lobe airspace disease which may represent atelectasis versus developing pneumonia.   Electronically Signed   By: Kathreen Devoid   On: 11/02/2013 12:04   Dg C-arm 1-60 Min  11/02/2013   CLINICAL DATA:  Posterior lumbar fusion.  EXAM: LUMBAR SPINE - 2-3 VIEW; DG C-ARM 1-60 MIN  COMPARISON:  Lumbar spine radiograph November 02, 2013 at 8:29 a.m.  FINDINGS: Two fluoroscopic spot views of the lower lumbar spine submitted, radiologist was not present at time of image acquisition. L3 and L4 pedicle screws in place with posterior surgical hardware. L3-4 and L4-5 interbody disc material.  Severe L5-S1 degenerative disc disease suspected.  IMPRESSION: L3 and L4 pedicle screws with interbody fusion material at L3-4 and L4-5.   Electronically Signed   By: Elon Alas   On: 11/02/2013 14:06    Assessment/Plan: Improving    LOS: 1 day  Continue to mobilize with PT in [facility].   Verdis Prime 11/03/2013, 10:20 AM

## 2013-11-04 LAB — GLUCOSE, CAPILLARY
Glucose-Capillary: 181 mg/dL — ABNORMAL HIGH (ref 70–99)
Glucose-Capillary: 262 mg/dL — ABNORMAL HIGH (ref 70–99)

## 2013-11-04 MED ORDER — METHOCARBAMOL 500 MG PO TABS: 500.0000 mg | ORAL_TABLET | Freq: Three times a day (TID) | ORAL | Status: DC | PRN

## 2013-11-04 MED ORDER — OXYCODONE-ACETAMINOPHEN 10-325 MG PO TABS: 1.0000 | ORAL_TABLET | ORAL | Status: DC | PRN

## 2013-11-04 MED ORDER — OXYCODONE-ACETAMINOPHEN 5-325 MG PO TABS: 1.0000 | ORAL_TABLET | ORAL | Status: DC | PRN

## 2013-11-04 NOTE — Progress Notes (Signed)
Pt d/c to home by car with family. Assessment stable. Prescriptions given. Pt verbalizes understanding of d/c instructions. 

## 2013-11-04 NOTE — Discharge Summary (Signed)
Physician Discharge Summary  Patient ID: Robyn Hill MRN: [id number] DOB/AGE: 01/27/1946 68 y.o.  Admit date: 11/02/2013 Discharge date: 11/04/2013  Admission Diagnoses:Lumbar spinal stenosis and spondylolisthesis  Discharge Diagnoses: Lumbar spinal stenosis and spondylolisthesis Active Problems:   Spondylolisthesis of lumbar region   Discharged Condition: good  Hospital Course: Patient underwent exploration of fusion at L 45 level with decompression and fusion L 34 level.She has done well.  Consults: None  Significant Diagnostic Studies: None  Treatments: surgery: exploration of fusion at L 45 level with decompression and fusion L 34 level  Discharge Exam: Blood pressure 133/39, pulse 80, temperature 98 F (36.7 C), temperature source Oral, resp. rate 18, height 5' 5" (1.651 m), weight 97 kg (213 lb 13.5 oz), SpO2 98.00%. Neurologic: Alert and oriented X 3, normal strength and tone. Normal symmetric reflexes. Normal coordination and gait Wound:CDI  Disposition: Home     Medication List         ACCU-CHEK COMPACT STRIPS test strip  Generic drug:  glucose blood  TEST ONCE DAILY     ACCU-CHEK SOFTCLIX LANCETS lancets  TEST ONCE DAILY     atorvastatin 40 MG tablet  Commonly known as:  LIPITOR  take 1 tablet by mouth once daily     Blood Glucose Meter kit  Use as instructed/ she needs a new meter Accu -check one meter.     ACCU-CHEK COMPACT CARE KIT Kit  TEST AS DIRECTED     cholecalciferol 1000 UNITS tablet  Commonly known as:  VITAMIN D  Take 1,000 Units by mouth daily.     desonide 0.05 % cream  Commonly known as:  DESOWEN  Apply 1 application topically 2 (two) times daily.     DEXILANT 60 MG capsule  Generic drug:  dexlansoprazole  take 1 capsule by mouth twice a day     DULoxetine 60 MG capsule  Commonly known as:  CYMBALTA  Take 60 mg by mouth 2 (two) times daily.     enalapril 20 MG tablet  Commonly known as:  VASOTEC  take 1 tablet by mouth  once daily     Exenatide ER 2 MG Pen  Commonly known as:  BYDUREON  Inject 2 mg into the skin once a week.     hydrochlorothiazide 25 MG tablet  Commonly known as:  HYDRODIURIL  take 1 tablet by mouth once daily     HYDROcodone-acetaminophen 7.5-325 MG per tablet  Commonly known as:  NORCO  1/2-1 tablet po up to TID for pain     meloxicam 7.5 MG tablet  Commonly known as:  MOBIC  take 1 tablet twice a day with food     methocarbamol 500 MG tablet  Commonly known as:  ROBAXIN  Take 1 tablet (500 mg total) by mouth every 8 (eight) hours as needed for muscle spasms.     oxyCODONE-acetaminophen 5-325 MG per tablet  Commonly known as:  PERCOCET/ROXICET  Take 1-2 tablets by mouth every 3 (three) hours as needed for moderate pain.     oxyCODONE-acetaminophen 10-325 MG per tablet  Commonly known as:  PERCOCET  Take 1 tablet by mouth every 3 (three) hours as needed for pain.     sitaGLIPtin-metformin 50-1000 MG per tablet  Commonly known as:  JANUMET  Take 1 tablet by mouth 2 (two) times daily with a meal.     triamcinolone cream 0.1 %  Commonly known as:  KENALOG  Apply 1 application topically 2 (two) times daily as needed.           Signed: , D, MD 11/04/2013, 9:12 AM  

## 2013-11-04 NOTE — Progress Notes (Signed)
Subjective: Patient reports doing well  Objective: Vital signs in last 24 hours: Temp:  [97.8 F (36.6 C)-98.6 F (37 C)] 98 F (36.7 C) (01/17 0606) Pulse Rate:  [80-87] 80 (01/17 0606) Resp:  [18-20] 18 (01/17 0606) BP: (102-133)/(39-51) 133/39 mmHg (01/17 0606) SpO2:  [91 %-99 %] 98 % (01/17 0606) Weight:  [97 kg (213 lb 13.5 oz)] 97 kg (213 lb 13.5 oz) (01/16 1900)  Intake/Output from previous day: 01/16 0701 - 01/17 0700 In: 760 [P.O.:760] Out: 75 [Drains:75] Intake/Output this shift: Total I/O In: 360 [P.O.:360] Out: -   Physical Exam: Full strength.  Dressing CDI.  Lab Results: No results found for this basename: WBC, HGB, HCT, PLT,  in the last 72 hours BMET No results found for this basename: NA, K, CL, CO2, GLUCOSE, BUN, CREATININE, CALCIUM,  in the last 72 hours  Studies/Results: Dg Lumbar Spine 2-3 Views  11/02/2013   CLINICAL DATA:  Posterior lumbar fusion.  EXAM: LUMBAR SPINE - 2-3 VIEW; DG C-ARM 1-60 MIN  COMPARISON:  Lumbar spine radiograph November 02, 2013 at 8:29 a.m.  FINDINGS: Two fluoroscopic spot views of the lower lumbar spine submitted, radiologist was not present at time of image acquisition. L3 and L4 pedicle screws in place with posterior surgical hardware. L3-4 and L4-5 interbody disc material.  Severe L5-S1 degenerative disc disease suspected.  IMPRESSION: L3 and L4 pedicle screws with interbody fusion material at L3-4 and L4-5.   Electronically Signed   By: Elon Alas   On: 11/02/2013 14:06   Dg Lumbar Spine 1 View  11/02/2013   CLINICAL DATA:  PLIF 3-4 EXPLORATION 4-5  EXAM: LUMBAR SPINE - 1 VIEW  COMPARISON:  MR LUMBAR SPINE WO/W CM dated 08/22/2013  FINDINGS: Single lateral cross-table view of the lumbar spine provided.  There is interbody fusion at L4-5. There is degenerative disc disease at L5-S1. There are 2 metallic probes posteriorly. The more superior metallic probe projects over the L3 posterior element. The more inferior metallic  probe projects over the L4 vertebral body.  IMPRESSION: There is hazy right lower lobe airspace disease which may represent atelectasis versus developing pneumonia.   Electronically Signed   By: Kathreen Devoid   On: 11/02/2013 12:04   Dg C-arm 1-60 Min  11/02/2013   CLINICAL DATA:  Posterior lumbar fusion.  EXAM: LUMBAR SPINE - 2-3 VIEW; DG C-ARM 1-60 MIN  COMPARISON:  Lumbar spine radiograph November 02, 2013 at 8:29 a.m.  FINDINGS: Two fluoroscopic spot views of the lower lumbar spine submitted, radiologist was not present at time of image acquisition. L3 and L4 pedicle screws in place with posterior surgical hardware. L3-4 and L4-5 interbody disc material.  Severe L5-S1 degenerative disc disease suspected.  IMPRESSION: L3 and L4 pedicle screws with interbody fusion material at L3-4 and L4-5.   Electronically Signed   By: Elon Alas   On: 11/02/2013 14:06    Assessment/Plan: Discharge home    LOS: 2 days    Peggyann Shoals, MD 11/04/2013, 9:10 AM

## 2013-11-04 NOTE — Progress Notes (Signed)
   CARE MANAGEMENT NOTE 11/04/2013  Patient:  CHALISE, PE   Account Number:  [id number]  Date Initiated:  11/04/2013  Documentation initiated by:  [facility]  Subjective/Objective Assessment:   adm: Lumbar spinal stenosis and spondylolisthesis     Action/Plan:   discharge planning   Anticipated DC Date:  11/04/2013   Anticipated DC Plan:  [place]  CM consult      [facility] Choice  HOME HEALTH   Choice offered to / List presented to:          [facility] arranged  HH-2 PT      [place].   Status of service:  Completed, signed off Medicare Important Message given?   (If response is "NO", the following Medicare IM given date fields will be blank) Date Medicare IM given:   Date Additional Medicare IM given:    Discharge Disposition:  [place]  Per UR Regulation:    If discussed at Long Length of Stay Meetings, dates discussed:    Comments:  11/04/13 11:10 CM spoke with pt to offer choice.  Pt states she has no preference and first one is fine.  [facility] chosen to render HHPT.  No DME recc. or ordered.  Address and contanct numbers were verified.  Referral faxed to [facility].  No other CM needs were communicated.  Mariane Masters, BSN, CM [phone].

## 2013-11-06 DIAGNOSIS — M47817 Spondylosis without myelopathy or radiculopathy, lumbosacral region: Secondary | ICD-10-CM | POA: Diagnosis not present

## 2013-11-06 DIAGNOSIS — Z4789 Encounter for other orthopedic aftercare: Secondary | ICD-10-CM | POA: Diagnosis not present

## 2013-11-06 DIAGNOSIS — IMO0001 Reserved for inherently not codable concepts without codable children: Secondary | ICD-10-CM | POA: Diagnosis not present

## 2013-11-06 DIAGNOSIS — I10 Essential (primary) hypertension: Secondary | ICD-10-CM | POA: Diagnosis not present

## 2013-11-06 DIAGNOSIS — E119 Type 2 diabetes mellitus without complications: Secondary | ICD-10-CM | POA: Diagnosis not present

## 2013-11-06 DIAGNOSIS — R262 Difficulty in walking, not elsewhere classified: Secondary | ICD-10-CM | POA: Diagnosis not present

## 2013-11-06 MED FILL — Heparin Sodium (Porcine) Inj 1000 Unit/ML: INTRAMUSCULAR | Qty: 30 | Status: AC

## 2013-11-06 MED FILL — Sodium Chloride IV Soln 0.9%: INTRAVENOUS | Qty: 1000 | Status: AC

## 2013-11-07 LAB — GLUCOSE, CAPILLARY: Glucose-Capillary: 247 mg/dL — ABNORMAL HIGH (ref 70–99)

## 2013-11-10 DIAGNOSIS — E119 Type 2 diabetes mellitus without complications: Secondary | ICD-10-CM | POA: Diagnosis not present

## 2013-11-10 DIAGNOSIS — R262 Difficulty in walking, not elsewhere classified: Secondary | ICD-10-CM | POA: Diagnosis not present

## 2013-11-10 DIAGNOSIS — Z4789 Encounter for other orthopedic aftercare: Secondary | ICD-10-CM | POA: Diagnosis not present

## 2013-11-10 DIAGNOSIS — M47817 Spondylosis without myelopathy or radiculopathy, lumbosacral region: Secondary | ICD-10-CM | POA: Diagnosis not present

## 2013-11-10 DIAGNOSIS — IMO0001 Reserved for inherently not codable concepts without codable children: Secondary | ICD-10-CM | POA: Diagnosis not present

## 2013-11-10 DIAGNOSIS — I10 Essential (primary) hypertension: Secondary | ICD-10-CM | POA: Diagnosis not present

## 2013-11-14 DIAGNOSIS — R262 Difficulty in walking, not elsewhere classified: Secondary | ICD-10-CM | POA: Diagnosis not present

## 2013-11-14 DIAGNOSIS — Z4789 Encounter for other orthopedic aftercare: Secondary | ICD-10-CM | POA: Diagnosis not present

## 2013-11-14 DIAGNOSIS — M47817 Spondylosis without myelopathy or radiculopathy, lumbosacral region: Secondary | ICD-10-CM | POA: Diagnosis not present

## 2013-11-14 DIAGNOSIS — IMO0001 Reserved for inherently not codable concepts without codable children: Secondary | ICD-10-CM | POA: Diagnosis not present

## 2013-11-14 DIAGNOSIS — E119 Type 2 diabetes mellitus without complications: Secondary | ICD-10-CM | POA: Diagnosis not present

## 2013-11-14 DIAGNOSIS — I10 Essential (primary) hypertension: Secondary | ICD-10-CM | POA: Diagnosis not present

## 2013-11-17 DIAGNOSIS — M47817 Spondylosis without myelopathy or radiculopathy, lumbosacral region: Secondary | ICD-10-CM | POA: Diagnosis not present

## 2013-11-17 DIAGNOSIS — E119 Type 2 diabetes mellitus without complications: Secondary | ICD-10-CM | POA: Diagnosis not present

## 2013-11-17 DIAGNOSIS — IMO0001 Reserved for inherently not codable concepts without codable children: Secondary | ICD-10-CM | POA: Diagnosis not present

## 2013-11-17 DIAGNOSIS — R262 Difficulty in walking, not elsewhere classified: Secondary | ICD-10-CM | POA: Diagnosis not present

## 2013-11-17 DIAGNOSIS — I10 Essential (primary) hypertension: Secondary | ICD-10-CM | POA: Diagnosis not present

## 2013-11-17 DIAGNOSIS — Z4789 Encounter for other orthopedic aftercare: Secondary | ICD-10-CM | POA: Diagnosis not present

## 2013-11-20 DIAGNOSIS — Z4789 Encounter for other orthopedic aftercare: Secondary | ICD-10-CM | POA: Diagnosis not present

## 2013-11-20 DIAGNOSIS — IMO0001 Reserved for inherently not codable concepts without codable children: Secondary | ICD-10-CM | POA: Diagnosis not present

## 2013-11-20 DIAGNOSIS — E119 Type 2 diabetes mellitus without complications: Secondary | ICD-10-CM | POA: Diagnosis not present

## 2013-11-20 DIAGNOSIS — M47817 Spondylosis without myelopathy or radiculopathy, lumbosacral region: Secondary | ICD-10-CM | POA: Diagnosis not present

## 2013-11-20 DIAGNOSIS — I10 Essential (primary) hypertension: Secondary | ICD-10-CM | POA: Diagnosis not present

## 2013-11-20 DIAGNOSIS — R262 Difficulty in walking, not elsewhere classified: Secondary | ICD-10-CM | POA: Diagnosis not present

## 2013-11-22 DIAGNOSIS — M545 Low back pain, unspecified: Secondary | ICD-10-CM | POA: Diagnosis not present

## 2013-11-24 DIAGNOSIS — I10 Essential (primary) hypertension: Secondary | ICD-10-CM | POA: Diagnosis not present

## 2013-11-24 DIAGNOSIS — R262 Difficulty in walking, not elsewhere classified: Secondary | ICD-10-CM | POA: Diagnosis not present

## 2013-11-24 DIAGNOSIS — M47817 Spondylosis without myelopathy or radiculopathy, lumbosacral region: Secondary | ICD-10-CM | POA: Diagnosis not present

## 2013-11-24 DIAGNOSIS — E119 Type 2 diabetes mellitus without complications: Secondary | ICD-10-CM | POA: Diagnosis not present

## 2013-11-24 DIAGNOSIS — Z4789 Encounter for other orthopedic aftercare: Secondary | ICD-10-CM | POA: Diagnosis not present

## 2013-11-24 DIAGNOSIS — IMO0001 Reserved for inherently not codable concepts without codable children: Secondary | ICD-10-CM | POA: Diagnosis not present

## 2013-11-28 DIAGNOSIS — E119 Type 2 diabetes mellitus without complications: Secondary | ICD-10-CM | POA: Diagnosis not present

## 2013-11-28 DIAGNOSIS — Z4789 Encounter for other orthopedic aftercare: Secondary | ICD-10-CM | POA: Diagnosis not present

## 2013-11-28 DIAGNOSIS — R262 Difficulty in walking, not elsewhere classified: Secondary | ICD-10-CM | POA: Diagnosis not present

## 2013-11-28 DIAGNOSIS — I10 Essential (primary) hypertension: Secondary | ICD-10-CM | POA: Diagnosis not present

## 2013-11-28 DIAGNOSIS — IMO0001 Reserved for inherently not codable concepts without codable children: Secondary | ICD-10-CM | POA: Diagnosis not present

## 2013-11-28 DIAGNOSIS — M47817 Spondylosis without myelopathy or radiculopathy, lumbosacral region: Secondary | ICD-10-CM | POA: Diagnosis not present

## 2013-12-16 ENCOUNTER — Other Ambulatory Visit: Payer: Self-pay | Admitting: Medical

## 2013-12-18 NOTE — Telephone Encounter (Signed)
Is this okay to refill? 

## 2014-01-03 ENCOUNTER — Encounter: Payer: Self-pay | Admitting: Medical

## 2014-01-03 ENCOUNTER — Ambulatory Visit (INDEPENDENT_AMBULATORY_CARE_PROVIDER_SITE_OTHER): Payer: Medicare Other | Admitting: Medical

## 2014-01-03 VITALS — BP 130/80 | HR 68 | Temp 98.0°F | Resp 16 | Wt 207.0 lb

## 2014-01-03 DIAGNOSIS — M549 Dorsalgia, unspecified: Secondary | ICD-10-CM | POA: Diagnosis not present

## 2014-01-03 DIAGNOSIS — I10 Essential (primary) hypertension: Secondary | ICD-10-CM | POA: Diagnosis not present

## 2014-01-03 DIAGNOSIS — B353 Tinea pedis: Secondary | ICD-10-CM

## 2014-01-03 DIAGNOSIS — H409 Unspecified glaucoma: Secondary | ICD-10-CM | POA: Diagnosis not present

## 2014-01-03 DIAGNOSIS — D72829 Elevated white blood cell count, unspecified: Secondary | ICD-10-CM | POA: Insufficient documentation

## 2014-01-03 DIAGNOSIS — E119 Type 2 diabetes mellitus without complications: Secondary | ICD-10-CM

## 2014-01-03 DIAGNOSIS — E785 Hyperlipidemia, unspecified: Secondary | ICD-10-CM

## 2014-01-03 DIAGNOSIS — M4310 Spondylolisthesis, site unspecified: Secondary | ICD-10-CM | POA: Diagnosis not present

## 2014-01-03 DIAGNOSIS — R748 Abnormal levels of other serum enzymes: Secondary | ICD-10-CM

## 2014-01-03 DIAGNOSIS — Z23 Encounter for immunization: Secondary | ICD-10-CM

## 2014-01-03 DIAGNOSIS — G8929 Other chronic pain: Secondary | ICD-10-CM

## 2014-01-03 LAB — CBC WITH DIFFERENTIAL/PLATELET
Basophils Absolute: 0 10*3/uL (ref 0.0–0.1)
Basophils Relative: 0 % (ref 0–1)
Eosinophils Absolute: 0.4 10*3/uL (ref 0.0–0.7)
Eosinophils Relative: 3 % (ref 0–5)
HCT: 33.2 % — ABNORMAL LOW (ref 36.0–46.0)
Hemoglobin: 10.4 g/dL — ABNORMAL LOW (ref 12.0–15.0)
Lymphocytes Relative: 28 % (ref 12–46)
Lymphs Abs: 4 10*3/uL (ref 0.7–4.0)
MCH: 23.2 pg — ABNORMAL LOW (ref 26.0–34.0)
MCHC: 31.3 g/dL (ref 30.0–36.0)
MCV: 73.9 fL — ABNORMAL LOW (ref 78.0–100.0)
Monocytes Absolute: 1 10*3/uL (ref 0.1–1.0)
Monocytes Relative: 7 % (ref 3–12)
Neutro Abs: 8.8 10*3/uL — ABNORMAL HIGH (ref 1.7–7.7)
Neutrophils Relative %: 62 % (ref 43–77)
Platelets: 434 10*3/uL — ABNORMAL HIGH (ref 150–400)
RBC: 4.49 MIL/uL (ref 3.87–5.11)
RDW: 14.2 % (ref 11.5–15.5)
WBC: 14.2 10*3/uL — ABNORMAL HIGH (ref 4.0–10.5)

## 2014-01-03 LAB — COMPREHENSIVE METABOLIC PANEL
ALT: 32 U/L (ref 0–35)
AST: 26 U/L (ref 0–37)
Albumin: 4.2 g/dL (ref 3.5–5.2)
Alkaline Phosphatase: 100 U/L (ref 39–117)
BUN: 23 mg/dL (ref 6–23)
CO2: 30 mEq/L (ref 19–32)
Calcium: 10.3 mg/dL (ref 8.4–10.5)
Chloride: 100 mEq/L (ref 96–112)
Creat: 0.65 mg/dL (ref 0.50–1.10)
Glucose, Bld: 95 mg/dL (ref 70–99)
Potassium: 5.4 mEq/L — ABNORMAL HIGH (ref 3.5–5.3)
Sodium: 140 mEq/L (ref 135–145)
Total Bilirubin: 0.3 mg/dL (ref 0.2–1.2)
Total Protein: 7.2 g/dL (ref 6.0–8.3)

## 2014-01-03 LAB — POCT GLYCOSYLATED HEMOGLOBIN (HGB A1C): Hemoglobin A1C: 7.7

## 2014-01-03 MED ORDER — EXENATIDE ER 2 MG ~~LOC~~ PEN: 2.0000 mg | PEN_INJECTOR | SUBCUTANEOUS | Status: DC

## 2014-01-03 NOTE — Patient Instructions (Addendum)
Ophthalmology Dr. Webb Laws [address], [place], [place] [address] [phone]   Fabio Pierce, M.D. Corena Herter, O.D. [place], [place], [place] [address] Medical telephone: [phone] Optical telephone: [phone]   Check insurance coverage for Prevnar 13/the second wave of pneumococcal vaccine.   You and your husband have both had the PPSV23/pneumovax, but not prevanr 13.   Next visit we will finalize the Hepatitis B vaccine series.

## 2014-01-03 NOTE — Assessment & Plan Note (Signed)
Patient is checking home blood sugars.   Home blood sugar records: BGs range between 119 to 130  Current symptoms include: none. Patient denies none.  Patient is checking their feet daily. Foot concerns (callous, ulcer, wound, thickened nails, toenail fungus, skin fungus, hammer toe): none Last dilated eye exam 6 months  Current treatments: Continued insulin which has been effective. Medication compliance: good  Current diet: in general, a "healthy" diet   Current exercise: no just had surgery Known diabetic complications: none

## 2014-01-03 NOTE — Assessment & Plan Note (Signed)
Last visit labs were back to normal although this has been a chronic issue

## 2014-01-03 NOTE — Assessment & Plan Note (Signed)
Needs refill on medication, seems to do fine on this medication with was increased in dose last visit.

## 2014-01-03 NOTE — Progress Notes (Signed)
  Subjective:   Robyn Hill is an 68 y.o. female who presents for follow up  Chief Complaint  Patient presents with  . medication check   Type II or unspecified type diabetes mellitus without mention of complication, not stated as uncontrolled Patient is checking home blood sugars.   Home blood sugar records: BGs range between 119 to 130  Current symptoms include: none. Patient denies none.  Patient is checking their feet daily. Foot concerns (callous, ulcer, wound, thickened nails, toenail fungus, skin fungus, hammer toe): none Last dilated eye exam 6 months  Current treatments: Continued insulin which has been effective. Medication compliance: good  Current diet: in general, a "healthy" diet   Current exercise: no just had surgery Known diabetic complications: none   Glaucoma Unhappy with current treatment plan with current eye doctor.  Feels like they are making no progress or real efforts, and eye sight and cataracts seem to be worsening.  Wants referral to different ophthalmologist  Essential hypertension, benign Compliant with medication without c/o.  Hyperlipidemia Compliant with medication without c/o  Leukocytosis, unspecified Here for f/u, lab today to recheck  Elevated liver enzymes Last visit labs were back to normal although this has been a chronic issue  Chronic back pain Needs refill on medication, seems to do fine on this medication with was increased in dose last visit.   Since last visit had lumbar spine surgery.   Seems to being doing well in this regard.   The following portions of the patient's history were reviewed and updated as appropriate: allergies, current medications, past family history, past medical history, past social history, past surgical history and problem list.  ROS as in subjective above    Objective:    Filed Vitals:   01/03/14 1114  BP: 130/80  Pulse: 68  Temp: 98 F (36.7 C)  Resp: 16    General appearance: alert,  no distress, WD/WN HEENT: normocephalic, sclerae anicteric, TMs pearly, nares patent, no discharge or erythema, pharynx normal Oral cavity: MMM, no lesions Neck: supple, no lymphadenopathy, no thyromegaly, no masses Heart: RRR, normal S1, S2, no murmurs Lungs: CTA bilaterally, no wheezes, rhonchi, or rales Abdomen: +bs, soft, non tender, non distended, no masses, no hepatomegaly, no splenomegaly Pulses: 2+ symmetric, upper and lower extremities, normal cap refill     Assessment:   Encounter Diagnoses  Name Primary?  . Type II or unspecified type diabetes mellitus without mention of complication, not stated as uncontrolled Yes  . Need for prophylactic vaccination against Streptococcus pneumoniae (pneumococcus)   . Need for hepatitis B vaccination   . Chronic back pain   . Essential hypertension, benign   . Glaucoma   . Tinea pedis   . Leukocytosis, unspecified   . Hyperlipidemia   . Elevated liver enzymes     Plan:   DM type II - much improved on Bydureon added last visit and Janumet.  C/t current regimen, diabetic diet Check insurance coverage for Prevnar 13.  She has had PPSV23. Return 49mo for recheck and Hep B #3. Chronic back pain - c/t current medication HTN - controlled on current medication Glaucoma - gave list of alternate ophthalmologist and if needed we can make referral Tinea pedis - resolved since last visit on Lamisil Leukocytosis - repeat labs today Hyperlipidemia - c/t same medication, labs today Elevated LFTs - lab surveillance today F/u pending labs, 75mo

## 2014-01-03 NOTE — Assessment & Plan Note (Signed)
Here for f/u, lab today to recheck

## 2014-01-03 NOTE — Assessment & Plan Note (Signed)
Compliant with medication without c/o.

## 2014-01-03 NOTE — Assessment & Plan Note (Signed)
Unhappy with current treatment plan with current eye doctor.  Feels like they are making no progress or real efforts, and eye sight and cataracts seem to be worsening.  Wants referral to different ophthalmologist

## 2014-01-03 NOTE — Assessment & Plan Note (Signed)
Compliant with medication without c/o. 

## 2014-01-11 NOTE — Progress Notes (Signed)
LM to [facility] 

## 2014-01-13 ENCOUNTER — Other Ambulatory Visit: Payer: Self-pay | Admitting: Medical

## 2014-01-18 ENCOUNTER — Telehealth: Payer: Self-pay | Admitting: Internal Medicine

## 2014-01-18 NOTE — Telephone Encounter (Signed)
Melissa - your input please?  See msg below

## 2014-01-18 NOTE — Telephone Encounter (Signed)
Pt called medicare to find out if they would cover prevnar 13 and they would not tell the patient anything. They were told that the provider from the office would have to call the insurance company to find out and provide them with a code.

## 2014-01-24 ENCOUNTER — Telehealth: Payer: Self-pay | Admitting: Family Medicine

## 2014-01-24 DIAGNOSIS — M4310 Spondylolisthesis, site unspecified: Secondary | ICD-10-CM | POA: Diagnosis not present

## 2014-01-24 DIAGNOSIS — H2510 Age-related nuclear cataract, unspecified eye: Secondary | ICD-10-CM | POA: Diagnosis not present

## 2014-01-24 DIAGNOSIS — Z6835 Body mass index (BMI) 35.0-35.9, adult: Secondary | ICD-10-CM | POA: Diagnosis not present

## 2014-01-24 DIAGNOSIS — M48061 Spinal stenosis, lumbar region without neurogenic claudication: Secondary | ICD-10-CM | POA: Diagnosis not present

## 2014-01-24 DIAGNOSIS — S6710XA Crushing injury of unspecified finger(s), initial encounter: Secondary | ICD-10-CM | POA: Diagnosis not present

## 2014-01-24 LAB — HM DIABETES EYE EXAM

## 2014-01-24 NOTE — Telephone Encounter (Signed)
Pt asked if Medicare B would cover prevnar 13 and per website and Lenna Sciara they will cover

## 2014-01-26 NOTE — Telephone Encounter (Signed)
It is covered by [facility]

## 2014-01-29 ENCOUNTER — Encounter: Payer: Self-pay | Admitting: Internal Medicine

## 2014-02-14 ENCOUNTER — Other Ambulatory Visit: Payer: Self-pay | Admitting: Medical

## 2014-02-14 NOTE — Telephone Encounter (Signed)
Is this okay to refill? 

## 2014-02-20 ENCOUNTER — Telehealth: Payer: Self-pay | Admitting: Medical

## 2014-02-21 ENCOUNTER — Other Ambulatory Visit: Payer: Self-pay | Admitting: Medical

## 2014-02-21 MED ORDER — HYDROCODONE-ACETAMINOPHEN 5-325 MG PO TABS: 1.0000 | ORAL_TABLET | Freq: Four times a day (QID) | ORAL | Status: DC | PRN

## 2014-02-22 DIAGNOSIS — H2510 Age-related nuclear cataract, unspecified eye: Secondary | ICD-10-CM | POA: Diagnosis not present

## 2014-02-22 DIAGNOSIS — H4011X0 Primary open-angle glaucoma, stage unspecified: Secondary | ICD-10-CM | POA: Diagnosis not present

## 2014-02-22 NOTE — Telephone Encounter (Signed)
done

## 2014-02-26 DIAGNOSIS — L570 Actinic keratosis: Secondary | ICD-10-CM | POA: Diagnosis not present

## 2014-02-26 DIAGNOSIS — L259 Unspecified contact dermatitis, unspecified cause: Secondary | ICD-10-CM | POA: Diagnosis not present

## 2014-03-19 ENCOUNTER — Other Ambulatory Visit: Payer: Self-pay | Admitting: Medical

## 2014-04-05 ENCOUNTER — Ambulatory Visit
Admission: RE | Admit: 2014-04-05 | Discharge: 2014-04-05 | Disposition: A | Discharge status: Home / Self Care | Payer: Medicare Other | Source: Ambulatory Visit | Attending: Medical | Admitting: Medical

## 2014-04-05 ENCOUNTER — Encounter: Payer: Self-pay | Admitting: Medical

## 2014-04-05 ENCOUNTER — Other Ambulatory Visit: Payer: Self-pay | Admitting: Medical

## 2014-04-05 ENCOUNTER — Ambulatory Visit (INDEPENDENT_AMBULATORY_CARE_PROVIDER_SITE_OTHER): Payer: Medicare Other | Admitting: Medical

## 2014-04-05 VITALS — BP 120/78 | HR 76 | Temp 97.8°F | Resp 16 | Wt 207.0 lb

## 2014-04-05 DIAGNOSIS — M25559 Pain in unspecified hip: Secondary | ICD-10-CM

## 2014-04-05 DIAGNOSIS — I10 Essential (primary) hypertension: Secondary | ICD-10-CM

## 2014-04-05 DIAGNOSIS — R5381 Other malaise: Secondary | ICD-10-CM

## 2014-04-05 DIAGNOSIS — E119 Type 2 diabetes mellitus without complications: Secondary | ICD-10-CM

## 2014-04-05 DIAGNOSIS — Z23 Encounter for immunization: Secondary | ICD-10-CM

## 2014-04-05 DIAGNOSIS — M549 Dorsalgia, unspecified: Secondary | ICD-10-CM | POA: Diagnosis not present

## 2014-04-05 DIAGNOSIS — Z1239 Encounter for other screening for malignant neoplasm of breast: Secondary | ICD-10-CM

## 2014-04-05 DIAGNOSIS — R5383 Other fatigue: Secondary | ICD-10-CM

## 2014-04-05 DIAGNOSIS — M25552 Pain in left hip: Secondary | ICD-10-CM

## 2014-04-05 DIAGNOSIS — D72829 Elevated white blood cell count, unspecified: Secondary | ICD-10-CM

## 2014-04-05 DIAGNOSIS — M2550 Pain in unspecified joint: Secondary | ICD-10-CM

## 2014-04-05 DIAGNOSIS — Z862 Personal history of diseases of the blood and blood-forming organs and certain disorders involving the immune mechanism: Secondary | ICD-10-CM | POA: Diagnosis not present

## 2014-04-05 DIAGNOSIS — H409 Unspecified glaucoma: Secondary | ICD-10-CM | POA: Diagnosis not present

## 2014-04-05 DIAGNOSIS — G8929 Other chronic pain: Secondary | ICD-10-CM

## 2014-04-05 DIAGNOSIS — I878 Other specified disorders of veins: Secondary | ICD-10-CM

## 2014-04-05 DIAGNOSIS — E785 Hyperlipidemia, unspecified: Secondary | ICD-10-CM

## 2014-04-05 DIAGNOSIS — I872 Venous insufficiency (chronic) (peripheral): Secondary | ICD-10-CM

## 2014-04-05 MED ORDER — HYDROCODONE-ACETAMINOPHEN 5-325 MG PO TABS: 1.0000 | ORAL_TABLET | Freq: Four times a day (QID) | ORAL | Status: DC | PRN

## 2014-04-05 MED ORDER — SILVER SULFADIAZINE 1 % EX CREA: 1.0000 "application " | TOPICAL_CREAM | Freq: Every day | CUTANEOUS | Status: DC

## 2014-04-05 NOTE — Addendum Note (Signed)
Addended by: Louie Bun on: 04/05/2014 09:14 AM   Modules accepted: Orders

## 2014-04-05 NOTE — Progress Notes (Signed)
Subjective:   LUCCIA REINHEIMER is a 68 y.o. female presenting on 04/05/2014 with Follow-up  Having left hip pain, can't put pressure on this, not sure if back. She says she is not walking but hobbling.  Has joint pains in general.  Gets tender spots that move a round.   Sometimes no pain, sometimes pain in a few spots, sometimes pain all over.    Diabetes type 2-compliant with Bydureon injection once weekly, Janumet 50/1000 twice daily.  Checking glucose, 199-175 readings.   Having ongoing bumps of legs.  Gets bites on legs, mosquitoes often, but the wounds don't seem to want to heal.    Leukocytosis-last visit numbers were still elevated but stable. No prior hematology consult. She does have family history of aplastic anemia and she has always been concerned about this  Chronic back pain-continues to need Norco 3 times a day 5/325 mg. Status post back surgery just a few months ago.  Glaucoma-after last visit we referred her to Dr. Delman Cheadle whom she saw 4/15.  End stage glaucoma, compliant with medication  She checked her insurance coverage and can get a Prevnar 13.  She is also here for hep B #3 today.  Last mammogram 2013.    Hyperlipidemia-compliant with medication without complaint  Hypertension- compliant with medication without complaint  No other aggravating or relieving factors.  No other complaint.  Review of Systems ROS as in subjective      Objective:     Filed Vitals:   04/05/14 0814  BP: 120/78  Pulse: 76  Temp: 97.8 F (36.6 C)  Resp: 16    General appearance: alert, no distress, WD/WN Skin: several small ulcers of bilat lower legs, some crusting/scaling, some trying to heal, no obvious infection, no induration, fluctuance, warmth Oral cavity: MMM, no lesions Neck: supple, no lymphadenopathy, no thyromegaly, no masses Heart: RRR, normal S1, S2, no murmurs Lungs: CTA bilaterally, no wheezes, rhonchi, or rales Abdomen: +bs, soft, non tender, non distended,  no masses, no hepatomegaly, no splenomegaly Pulses: 1+ symmetric, upper and lower extremities, normal cap refill Ext: 1+ nonpitting LE edema Back: several tender areas of paraspinal muscles throughout MSK: tender left hip, pain with left hip internal ROM, limping when weight bearing     Assessment: Encounter Diagnoses  Name Primary?  . Type II or unspecified type diabetes mellitus without mention of complication, not stated as uncontrolled Yes  . Leukocytosis, unspecified   . Chronic back pain   . Glaucoma   . Need for prophylactic vaccination against Streptococcus pneumoniae (pneumococcus)   . Need for hepatitis B vaccination   . Screening for breast cancer   . Hyperlipidemia   . Essential hypertension, benign   . Hip pain, left   . Polyarthralgia   . Venous stasis   . Other malaise and fatigue      Plan: Diabetes type 2- Leukocytosis-recheck CBC with differential today, and if still abnormal, referral to hematology Chronic back pain-not so great pain control with Norco 3 times a day, Cymbalta 60 mg twice daily, MOBIC 7.5 mg twice daily, consider rheum eval Hypertension-controlled on current medication, hydrochlorothiazide 25 mg daily, enalapril 20 mg daily Hyperlipidemia, continue current medication, Lipitor 40 mg daily Counseled on the pneumococcal vaccine.  Vaccine information sheet given.  Pneumococcal vaccine/Prevnar13 given after consent obtained. Counseled on hepatitis B vaccine, hepatitis B #3 given today Glaucoma-managed by Dr. Delman Cheadle Advise she go for mammogram this fall Hip pain - go for xray Polyarthralgia and chronic pain -  pending labs, consider rheum referral Nonhealing wound - begin silvadene topically, compression hose, elevation as discussed Fatigue - multifactoral, consider sleep apnea eval  Janeece was seen today for follow-up.  Diagnoses and associated orders for this visit:  Type II or unspecified type diabetes mellitus without mention of  complication, not stated as uncontrolled - Hemoglobin A1c  Leukocytosis, unspecified - CBC with Differential  Chronic back pain  Glaucoma  Need for prophylactic vaccination against Streptococcus pneumoniae (pneumococcus)  Need for hepatitis B vaccination  Screening for breast cancer  Hyperlipidemia  Essential hypertension, benign  Hip pain, left - DG Hip Bilateral W/Pelvis; Future  Polyarthralgia  Venous stasis  Other malaise and fatigue    Return pending labs, studies.

## 2014-04-06 ENCOUNTER — Other Ambulatory Visit: Payer: Self-pay | Admitting: Medical

## 2014-04-06 DIAGNOSIS — D649 Anemia, unspecified: Secondary | ICD-10-CM

## 2014-04-06 LAB — HEMOGLOBIN A1C
Hgb A1c MFr Bld: 7.3 % — ABNORMAL HIGH (ref <5.7)
Mean Plasma Glucose: 163 mg/dL — ABNORMAL HIGH (ref <117)

## 2014-04-06 LAB — IRON AND TIBC
%SAT: 6 % — ABNORMAL LOW (ref 20–55)
Iron: 25 ug/dL — ABNORMAL LOW (ref 42–145)
TIBC: 432 ug/dL (ref 250–470)
UIBC: 407 ug/dL — ABNORMAL HIGH (ref 125–400)

## 2014-04-06 LAB — CBC WITH DIFFERENTIAL/PLATELET
Basophils Absolute: 0.1 10*3/uL (ref 0.0–0.1)
Basophils Relative: 1 % (ref 0–1)
Eosinophils Absolute: 0.4 10*3/uL (ref 0.0–0.7)
Eosinophils Relative: 4 % (ref 0–5)
HCT: 32.6 % — ABNORMAL LOW (ref 36.0–46.0)
Hemoglobin: 10 g/dL — ABNORMAL LOW (ref 12.0–15.0)
Lymphocytes Relative: 30 % (ref 12–46)
Lymphs Abs: 2.9 10*3/uL (ref 0.7–4.0)
MCH: 21.8 pg — ABNORMAL LOW (ref 26.0–34.0)
MCHC: 30.7 g/dL (ref 30.0–36.0)
MCV: 71 fL — ABNORMAL LOW (ref 78.0–100.0)
Monocytes Absolute: 0.8 10*3/uL (ref 0.1–1.0)
Monocytes Relative: 8 % (ref 3–12)
Neutro Abs: 5.5 10*3/uL (ref 1.7–7.7)
Neutrophils Relative %: 57 % (ref 43–77)
Platelets: 374 10*3/uL (ref 150–400)
RBC: 4.59 MIL/uL (ref 3.87–5.11)
RDW: 16.6 % — ABNORMAL HIGH (ref 11.5–15.5)
WBC: 9.7 10*3/uL (ref 4.0–10.5)

## 2014-04-06 MED ORDER — METFORMIN HCL 1000 MG PO TABS
1000.0000 mg | ORAL_TABLET | Freq: Two times a day (BID) | ORAL | Status: DC
Start: 2014-04-06 — End: 2014-06-28

## 2014-04-06 MED ORDER — DAPAGLIFLOZIN PROPANEDIOL 5 MG PO TABS: 1.0000 | ORAL_TABLET | Freq: Every day | ORAL | Status: DC

## 2014-04-16 ENCOUNTER — Encounter: Payer: Self-pay | Admitting: Medical

## 2014-04-16 ENCOUNTER — Other Ambulatory Visit: Payer: Self-pay | Admitting: Medical

## 2014-04-16 MED ORDER — FERROUS GLUCONATE 324 (38 FE) MG PO TABS
324.0000 mg | ORAL_TABLET | Freq: Every day | ORAL | Status: DC
Start: 2014-04-16 — End: 2014-05-23

## 2014-04-18 ENCOUNTER — Telehealth: Payer: Self-pay | Admitting: Family Medicine

## 2014-04-18 ENCOUNTER — Other Ambulatory Visit: Payer: Self-pay | Admitting: Family Medicine

## 2014-04-18 MED ORDER — EXENATIDE ER 2 MG ~~LOC~~ PEN: 2.0000 mg | PEN_INJECTOR | SUBCUTANEOUS | Status: DC

## 2014-04-18 NOTE — Telephone Encounter (Signed)
Patient called for me to change her pharmacy. CLS

## 2014-04-19 ENCOUNTER — Telehealth: Payer: Self-pay | Admitting: Internal Medicine

## 2014-04-19 NOTE — Telephone Encounter (Signed)
Pt states that her insurance will not pay for farxiga and want to know if you will send in something else for her. Send to [facility] in [place]

## 2014-04-22 ENCOUNTER — Other Ambulatory Visit: Payer: Self-pay | Admitting: Medical

## 2014-04-23 ENCOUNTER — Encounter: Payer: Self-pay | Admitting: Internal Medicine

## 2014-04-23 ENCOUNTER — Telehealth: Payer: Self-pay | Admitting: Hematology and Oncology

## 2014-04-23 ENCOUNTER — Encounter: Payer: Self-pay | Admitting: Medical

## 2014-04-23 NOTE — Telephone Encounter (Signed)
S/W PATIENT AND GAVE NP APPT FOR 07/15 @ 10:15 W/DR. [place].  REFERRING DR. Chana Bode, PA DX- [place] PACKET MAILED.

## 2014-04-24 ENCOUNTER — Other Ambulatory Visit: Payer: Self-pay | Admitting: Medical

## 2014-04-24 ENCOUNTER — Telehealth: Payer: Self-pay | Admitting: Family Medicine

## 2014-04-24 MED ORDER — CANAGLIFLOZIN 100 MG PO TABS: 1.0000 | ORAL_TABLET | Freq: Every day | ORAL | Status: DC

## 2014-04-24 NOTE — Telephone Encounter (Signed)
Robyn Hill - I sent Invokana instead which is the same type of medication.    Robyn Hill - did you get a prior auth on Farxiga?  I sent Invokana as alternate.  She has already been on Metformin, Januvia at least.

## 2014-04-24 NOTE — Telephone Encounter (Signed)
I change a medication on the patients medication list. CLS

## 2014-04-24 NOTE — Telephone Encounter (Signed)
Patient is aware of the medication Invokana added to her medication's due to the fact her insurance will not cover [place]. CLS

## 2014-04-26 ENCOUNTER — Other Ambulatory Visit: Payer: Self-pay

## 2014-04-26 ENCOUNTER — Telehealth: Payer: Self-pay | Admitting: Medical

## 2014-04-26 MED ORDER — CANAGLIFLOZIN 100 MG PO TABS: 1.0000 | ORAL_TABLET | Freq: Every day | ORAL | Status: DC

## 2014-04-26 NOTE — Telephone Encounter (Signed)
Sent to [facility]

## 2014-05-02 ENCOUNTER — Encounter: Payer: Self-pay | Admitting: Hematology and Oncology

## 2014-05-02 ENCOUNTER — Ambulatory Visit: Payer: Medicare Other

## 2014-05-02 ENCOUNTER — Other Ambulatory Visit: Payer: Self-pay | Admitting: Hematology and Oncology

## 2014-05-02 ENCOUNTER — Telehealth: Payer: Self-pay | Admitting: Medical

## 2014-05-02 ENCOUNTER — Ambulatory Visit (HOSPITAL_BASED_OUTPATIENT_CLINIC_OR_DEPARTMENT_OTHER): Payer: Medicare Other | Admitting: Hematology and Oncology

## 2014-05-02 ENCOUNTER — Telehealth: Payer: Self-pay | Admitting: Hematology and Oncology

## 2014-05-02 VITALS — BP 164/75 | HR 80 | Temp 97.8°F | Resp 18 | Ht 65.0 in | Wt 207.5 lb

## 2014-05-02 DIAGNOSIS — Z800 Family history of malignant neoplasm of digestive organs: Secondary | ICD-10-CM | POA: Diagnosis not present

## 2014-05-02 DIAGNOSIS — D500 Iron deficiency anemia secondary to blood loss (chronic): Secondary | ICD-10-CM | POA: Diagnosis present

## 2014-05-02 DIAGNOSIS — Z8542 Personal history of malignant neoplasm of other parts of uterus: Secondary | ICD-10-CM | POA: Diagnosis not present

## 2014-05-02 NOTE — Progress Notes (Signed)
Checked in new patient with no financial issues. She has appt card and [place] been out of the [place]

## 2014-05-02 NOTE — Telephone Encounter (Signed)
I assume this is for repeat colonoscopy to rule out GI source of bleed correct?  If so, send form/do referral

## 2014-05-02 NOTE — Telephone Encounter (Signed)
[facility] to Dr. Jeanella Anton in [place]. They need referral from [place] sent before they can schedule

## 2014-05-02 NOTE — Progress Notes (Signed)
[facility] CONSULT NOTE  Patient Care Team: Denita Lung, MD as PCP - General (Family Medicine)  CHIEF COMPLAINTS/PURPOSE OF CONSULTATION:  Severe iron deficiency anemia  HISTORY OF PRESENTING ILLNESS:  Robyn Hill 68 y.o. female is here because of iron deficiency anemia  She was found to have abnormal CBC from routine blood work. She started to have anemia in March of 2016. In June 2016, repeat CBC showed progressive microcytic anemia with evidence of iron deficiency. Hemoglobin was 10 g, MCV was 71 low serum iron and high TIBC. She denies recent chest pain on exertion but complained of shortness of breath on minimal exertion, profound fatigue, dizziness, leg cramps, with mild weakness. She had not noticed any recent bleeding such as epistaxis or hematuria but she had intermittent hemorrhoidal bleeding on a monthly basis. She was known to have severe internal hemorrhoids from recent colonoscopy. The patient denies over the counter NSAID ingestion. She is not on antiplatelets agents. Her last colonoscopy was less than 3 years ago. She had prior diagnosis of recurrent cancer involving both the uterus and ovaries. There was more than 20 years ago. She never received adjuvant treatment. Her age appropriate screening programs are up-to-date. She  complains of excessive pica with chewing on ice. She eats a variety of diet. She never donated blood. She had received blood transfusion after delivery of her fifth child due to postpartum hemorrhage. She had severe heavy menstruation in the past. The patient was prescribed oral iron supplements and she takes one tablet daily with breakfast.  MEDICAL HISTORY:  Past Medical History  Diagnosis Date  . Hypertension   . Hyperlipidemia   . Glaucoma   . Urinary incontinence   . Chronic back pain   . Osteoarthritis   . Edema   . Female bladder prolapse   . Allergy   . Migraine   . GERD (gastroesophageal reflux disease)   .  Diverticulosis   . Chronic diarrhea     Colestipol therapy  . Adrenal mass, right     01/2010, MRI abdomen  . Blood transfusion 1970  . Vitamin D deficiency   . Rheumatic fever     age 19  . Diverticulitis   . Fatty liver   . H/O hiatal hernia   . Blind right eye     since childhood  . Asthma     "as a teenager"  . Type II diabetes mellitus   . Kidney stones   . History of UTI     remote past  . H/O mammogram 07/2012    normal  . H/O bone density study 06/03/2011  . Hypothyroidism   . History of uterine cancer     s/p hysterectomy  . TMJ (dislocation of temporomandibular joint)     Hx: of    SURGICAL HISTORY: Past Surgical History  Procedure Laterality Date  . Tonsillectomy and adenoidectomy  1952  . Minor laceration repair      right eye  . Cardiac catheterization  11/2006  . Liver biopsy  04/1999  . Edsi for back pain  last 01/2009    multiple x 5  . Steroid injections of hip  last 02/2009    multiple x 4  . Eye surgery      Bil Blind R eye  . Posterior fusion lumbar spine  04/05/12  . Breast biopsy  08/1997    right  . Kidney stone surgery  1979  . Cholecystectomy    . Trabeculectomy  1996    bilaterally; "for glaucoma"  . Dilation and curettage of uterus  1980  . Back surgery    . Colonoscopy  10/2008    polyps  . Abdominal hysterectomy  1980    no further pap smears needed    SOCIAL HISTORY: History   Social History  . Marital Status: Married    Spouse Name: N/A    Number of Children: N/A  . Years of Education: N/A   Occupational History  . Not on file.   Social History Main Topics  . Smoking status: Never Smoker   . Smokeless tobacco: Never Used  . Alcohol Use: Yes     Comment: rare  . Drug Use: No  . Sexual Activity: No     Comment: married   Other Topics Concern  . Not on file   Social History Narrative  . No narrative on file    FAMILY HISTORY: Family History  Problem Relation Age of Onset  . Anesthesia problems Neg Hx   .  Cancer - Colon Brother   . Heart disease Brother   . Hypertension Other   . Diabetes Other   . Cancer Mother     aplastic anemia  . Cancer - Colon Brother     colon ca    ALLERGIES:  is allergic to sunflowerseed oil; kiwi extract; and oxycodone.  MEDICATIONS:  Current Outpatient Prescriptions  Medication Sig Dispense Refill  . ACCU-CHEK SOFTCLIX LANCETS lancets TEST ONCE DAILY  100 each  3  . atorvastatin (LIPITOR) 40 MG tablet take 1 tablet by mouth once daily  30 tablet  5  . Blood Glucose Monitoring Suppl (ACCU-CHEK COMPACT CARE KIT) KIT TEST AS DIRECTED  1 each  0  . Blood Glucose Monitoring Suppl (BLOOD GLUCOSE METER) kit Use as instructed/ she needs a new meter Accu -check one meter.  1 each  0  . BYDUREON 2 MG PEN INJECT 2MG INTO THE SKIN ONCE A WEEK  4 each  2  . Canagliflozin (INVOKANA) 100 MG TABS Take 1 tablet (100 mg total) by mouth daily. 1 tablet po daily before first meal  30 tablet  5  . cholecalciferol (VITAMIN D) 1000 UNITS tablet Take 1,000 Units by mouth daily.      Marland Kitchen desonide (DESOWEN) 0.05 % cream Apply 1 application topically 2 (two) times daily.      Marland Kitchen DEXILANT 60 MG capsule TAKE ONE CAPSULE TWICE A DAY  60 capsule  5  . DULoxetine (CYMBALTA) 60 MG capsule take 1 capsule by mouth twice a day  180 capsule  2  . enalapril (VASOTEC) 20 MG tablet take 1 tablet by mouth once daily  30 tablet  5  . ferrous gluconate (FERGON) 324 MG tablet Take 1 tablet (324 mg total) by mouth daily with breakfast.  60 tablet  3  . hydrochlorothiazide (HYDRODIURIL) 25 MG tablet take 1 tablet by mouth once daily  30 tablet  5  . [START ON 06/05/2014] HYDROcodone-acetaminophen (NORCO/VICODIN) 5-325 MG per tablet Take 1 tablet by mouth every 6 (six) hours as needed for moderate pain.  90 tablet  0  . meloxicam (MOBIC) 7.5 MG tablet take 1 tablet twice a day with food  60 tablet  1  . metFORMIN (GLUCOPHAGE) 1000 MG tablet Take 1 tablet (1,000 mg total) by mouth 2 (two) times daily with a  meal.  60 tablet  3  . silver sulfADIAZINE (SILVADENE) 1 % cream Apply 1 application topically daily.  50 g  0  . triamcinolone cream (KENALOG) 0.1 % Apply 1 application topically 2 (two) times daily as needed.       No current facility-administered medications for this visit.    REVIEW OF SYSTEMS:   Constitutional: Denies fevers, chills or abnormal night sweats Eyes: Denies blurriness of vision, double vision or watery eyes Ears, nose, mouth, throat, and face: Denies mucositis or sore throat Cardiovascular: Denies palpitation, chest discomfort or lower extremity swelling Gastrointestinal:  Denies nausea, heartburn or change in bowel habits Skin: Denies abnormal skin rashes Lymphatics: Denies new lymphadenopathy or easy bruising Neurological:Denies numbness, tingling or new weaknesses Behavioral/Psych: Mood is stable, no new changes  All other systems were reviewed with the patient and are negative.  PHYSICAL EXAMINATION: ECOG PERFORMANCE STATUS: 1 - Symptomatic but completely ambulatory  Filed Vitals:   05/02/14 1030  BP: 164/75  Pulse: 80  Temp: 97.8 F (36.6 C)  Resp: 18   Filed Weights   05/02/14 1030  Weight: 207 lb 8 oz (94.121 kg)    GENERAL:alert, no distress and comfortable. She is morbidly obese SKIN: skin color, texture, turgor are normal, no rashes or significant lesions EYES: normal, conjunctiva are pale and non-injected, sclera clear OROPHARYNX:no exudate, no erythema and lips, buccal mucosa, and tongue normal  NECK: supple, thyroid normal size, non-tender, without nodularity LYMPH:  no palpable lymphadenopathy in the cervical, axillary or inguinal LUNGS: clear to auscultation and percussion with normal breathing effort HEART: regular rate & rhythm and no murmurs and no lower extremity edema ABDOMEN:abdomen soft, non-tender and normal bowel sounds Musculoskeletal:no cyanosis of digits and no clubbing  PSYCH: alert & oriented x 3 with fluent speech NEURO: no  focal motor/sensory deficits  LABORATORY DATA:  I have reviewed the data as listed Recent Results (from the past 2160 hour(s))  CBC WITH DIFFERENTIAL     Status: Abnormal   Collection Time    04/05/14  8:44 AM      Result Value Ref Range   WBC 9.7  4.0 - 10.5 K/uL   RBC 4.59  3.87 - 5.11 MIL/uL   Hemoglobin 10.0 (*) 12.0 - 15.0 g/dL   HCT 32.6 (*) 36.0 - 46.0 %   MCV 71.0 (*) 78.0 - 100.0 fL   MCH 21.8 (*) 26.0 - 34.0 pg   MCHC 30.7  30.0 - 36.0 g/dL   RDW 16.6 (*) 11.5 - 15.5 %   Platelets 374  150 - 400 K/uL   Neutrophils Relative % 57  43 - 77 %   Neutro Abs 5.5  1.7 - 7.7 K/uL   Lymphocytes Relative 30  12 - 46 %   Lymphs Abs 2.9  0.7 - 4.0 K/uL   Monocytes Relative 8  3 - 12 %   Monocytes Absolute 0.8  0.1 - 1.0 K/uL   Eosinophils Relative 4  0 - 5 %   Eosinophils Absolute 0.4  0.0 - 0.7 K/uL   Basophils Relative 1  0 - 1 %   Basophils Absolute 0.1  0.0 - 0.1 K/uL   Smear Review Criteria for review not met    IRON AND TIBC     Status: Abnormal   Collection Time    04/05/14  8:44 AM      Result Value Ref Range   Iron 25 (*) 42 - 145 ug/dL   UIBC 407 (*) 125 - 400 ug/dL   TIBC 432  250 - 470 ug/dL   %SAT 6 (*) 20 - 55 %  HEMOGLOBIN A1C     Status: Abnormal   Collection Time    04/05/14  8:50 AM      Result Value Ref Range   Hemoglobin A1C 7.3 (*) <5.7 %   Comment:                                                                            According to the ADA Clinical Practice Recommendations for 2011, when     HbA1c is used as a screening test:             >=6.5%   Diagnostic of Diabetes Mellitus                (if abnormal result is confirmed)           5.7-6.4%   Increased risk of developing Diabetes Mellitus           References:Diagnosis and Classification of Diabetes Mellitus,Diabetes     VQXI,5038,88(KCMKL 1):S62-S69 and Standards of Medical Care in             Diabetes - 2011,Diabetes Care,2011,34 (Suppl 1):S11-S61.         Mean Plasma Glucose 163 (*)  <117 mg/dL   ASSESSMENT & PLAN:  Iron deficiency anemia secondary to blood loss (chronic) The patient likely had chronic GI bleed until proven otherwise. Due to her personal history of cancer and strong family history of colon cancer, I recommend repeat EGD to exclude upper GI source of bleeding. Last colonoscopy was a year ago which only show hemorrhoids. She has just started taking oral iron supplements recently. I recommend she takes her iron supplement before meals and to try to take it 2 times a day if tolerated. I plan to see her back within a month and if she is not able to tolerate oral iron supplements or if it is ineffective, I will proceed with intravenous iron infusion.  History of uterine cancer She had surgery many years ago, stage of disease wasn't known but she states that in involving the uterus and ovaries. She never received adjuvant chemotherapy or radiation therapy. Clinically, she has no evidence of disease however given her severe iron deficiency anemia, I recommend EGD as outlined above.      All questions were answered. The patient knows to call the clinic with any problems, questions or concerns. I spent 40 minutes counseling the patient face to face. The total time spent in the appointment was 55 minutes and more than 50% was on counseling.     [facility], Uri Turnbough, MD 05/02/2014 8:14 PM

## 2014-05-02 NOTE — Assessment & Plan Note (Signed)
The patient likely had chronic GI bleed until proven otherwise. Due to her personal history of cancer and strong family history of colon cancer, I recommend repeat EGD to exclude upper GI source of bleeding. Last colonoscopy was a year ago which only show hemorrhoids. She has just started taking oral iron supplements recently. I recommend she takes her iron supplement before meals and to try to take it 2 times a day if tolerated. I plan to see her back within a month and if she is not able to tolerate oral iron supplements or if it is ineffective, I will proceed with intravenous iron infusion.

## 2014-05-02 NOTE — Telephone Encounter (Signed)
LM on Dr. Alvy Bimler nurse VM to give me a CB regarding pt referral

## 2014-05-02 NOTE — Assessment & Plan Note (Signed)
She had surgery many years ago, stage of disease wasn't known but she states that in involving the uterus and ovaries. She never received adjuvant chemotherapy or radiation therapy. Clinically, she has no evidence of disease however given her severe iron deficiency anemia, I recommend EGD as outlined above.

## 2014-05-02 NOTE — Telephone Encounter (Signed)
s.w. pt and advised on Aug appt....pt ok and aware °

## 2014-05-03 ENCOUNTER — Telehealth: Payer: Self-pay | Admitting: *Deleted

## 2014-05-03 NOTE — Telephone Encounter (Signed)
Pt scheduled with Dr. Truddie Hidden on 05/15/14 at 10:30. Lm on pt VM with appt date and time.

## 2014-05-03 NOTE — Telephone Encounter (Signed)
Pt requests Dr. Alvy Bimler office note faxed to her PCP and to GI MD,  Dr. Truddie Hidden so referral can be made.  Faxed note to both Dr. Redmond School and Dr. Lucile Crater office as requested.

## 2014-05-15 DIAGNOSIS — I739 Peripheral vascular disease, unspecified: Secondary | ICD-10-CM | POA: Diagnosis not present

## 2014-05-15 DIAGNOSIS — D649 Anemia, unspecified: Secondary | ICD-10-CM | POA: Diagnosis not present

## 2014-05-21 DIAGNOSIS — R5383 Other fatigue: Secondary | ICD-10-CM | POA: Diagnosis not present

## 2014-05-21 DIAGNOSIS — D649 Anemia, unspecified: Secondary | ICD-10-CM | POA: Diagnosis not present

## 2014-05-21 DIAGNOSIS — R5381 Other malaise: Secondary | ICD-10-CM | POA: Diagnosis not present

## 2014-05-21 DIAGNOSIS — D500 Iron deficiency anemia secondary to blood loss (chronic): Secondary | ICD-10-CM | POA: Diagnosis not present

## 2014-05-21 DIAGNOSIS — Z885 Allergy status to narcotic agent status: Secondary | ICD-10-CM | POA: Diagnosis not present

## 2014-05-21 DIAGNOSIS — K219 Gastro-esophageal reflux disease without esophagitis: Secondary | ICD-10-CM | POA: Diagnosis not present

## 2014-05-21 DIAGNOSIS — I739 Peripheral vascular disease, unspecified: Secondary | ICD-10-CM | POA: Diagnosis not present

## 2014-05-21 DIAGNOSIS — Z87442 Personal history of urinary calculi: Secondary | ICD-10-CM | POA: Diagnosis not present

## 2014-05-21 DIAGNOSIS — Z8542 Personal history of malignant neoplasm of other parts of uterus: Secondary | ICD-10-CM | POA: Diagnosis not present

## 2014-05-21 DIAGNOSIS — Z8489 Family history of other specified conditions: Secondary | ICD-10-CM | POA: Diagnosis not present

## 2014-05-21 DIAGNOSIS — E559 Vitamin D deficiency, unspecified: Secondary | ICD-10-CM | POA: Diagnosis not present

## 2014-05-21 DIAGNOSIS — T189XXA Foreign body of alimentary tract, part unspecified, initial encounter: Secondary | ICD-10-CM | POA: Diagnosis not present

## 2014-05-21 DIAGNOSIS — M79609 Pain in unspecified limb: Secondary | ICD-10-CM | POA: Diagnosis not present

## 2014-05-21 DIAGNOSIS — R238 Other skin changes: Secondary | ICD-10-CM | POA: Diagnosis not present

## 2014-05-21 DIAGNOSIS — Z809 Family history of malignant neoplasm, unspecified: Secondary | ICD-10-CM | POA: Diagnosis not present

## 2014-05-21 DIAGNOSIS — Z79899 Other long term (current) drug therapy: Secondary | ICD-10-CM | POA: Diagnosis not present

## 2014-05-21 DIAGNOSIS — Z91018 Allergy to other foods: Secondary | ICD-10-CM | POA: Diagnosis not present

## 2014-05-21 DIAGNOSIS — K319 Disease of stomach and duodenum, unspecified: Secondary | ICD-10-CM | POA: Diagnosis not present

## 2014-05-21 HISTORY — PX: UPPER GI ENDOSCOPY: SHX6162

## 2014-05-23 ENCOUNTER — Other Ambulatory Visit: Payer: Self-pay | Admitting: Hematology and Oncology

## 2014-05-23 ENCOUNTER — Telehealth: Payer: Self-pay | Admitting: *Deleted

## 2014-05-23 DIAGNOSIS — D500 Iron deficiency anemia secondary to blood loss (chronic): Secondary | ICD-10-CM

## 2014-05-23 MED ORDER — FERROUS GLUCONATE 324 (38 FE) MG PO TABS: 324.0000 mg | ORAL_TABLET | Freq: Two times a day (BID) | ORAL | Status: DC

## 2014-05-23 MED ORDER — FERROUS GLUCONATE 324 (38 FE) MG PO TABS: 324.0000 mg | ORAL_TABLET | Freq: Every day | ORAL | Status: DC

## 2014-05-23 NOTE — Telephone Encounter (Signed)
please

## 2014-05-23 NOTE — Telephone Encounter (Signed)
Refill on Robyn Hill is for once daily.  Pt says she was instructed to take twice daily?

## 2014-05-23 NOTE — Telephone Encounter (Signed)
Informed pt of refill on Fergon for twice daily sent to [facility]. She verbalized understanding.

## 2014-05-23 NOTE — Telephone Encounter (Signed)
done

## 2014-05-23 NOTE — Telephone Encounter (Signed)
Pt states Dr. Alvy Bimler recommended she increase her Iron tablet to twice a day.  She had Rx from another MD for [facility] but is almost out.  Asks if Dr. Alvy Bimler will refill the [facility] for Twice daily?  She uses [facility] in [place], [place].

## 2014-05-29 ENCOUNTER — Encounter: Payer: Self-pay | Admitting: Medical

## 2014-05-29 DIAGNOSIS — H2510 Age-related nuclear cataract, unspecified eye: Secondary | ICD-10-CM | POA: Diagnosis not present

## 2014-05-29 DIAGNOSIS — H4011X0 Primary open-angle glaucoma, stage unspecified: Secondary | ICD-10-CM | POA: Diagnosis not present

## 2014-05-31 ENCOUNTER — Encounter: Payer: Self-pay | Admitting: Internal Medicine

## 2014-06-01 ENCOUNTER — Other Ambulatory Visit (HOSPITAL_BASED_OUTPATIENT_CLINIC_OR_DEPARTMENT_OTHER): Payer: Medicare Other

## 2014-06-01 DIAGNOSIS — D500 Iron deficiency anemia secondary to blood loss (chronic): Secondary | ICD-10-CM

## 2014-06-01 LAB — CBC & DIFF AND RETIC
BASO%: 0.5 % (ref 0.0–2.0)
Basophils Absolute: 0.1 10*3/uL (ref 0.0–0.1)
EOS%: 3.6 % (ref 0.0–7.0)
Eosinophils Absolute: 0.4 10*3/uL (ref 0.0–0.5)
HCT: 41.2 % (ref 34.8–46.6)
HGB: 13 g/dL (ref 11.6–15.9)
Immature Retic Fract: 2.8 % (ref 1.60–10.00)
LYMPH%: 28.3 % (ref 14.0–49.7)
MCH: 24.3 pg — ABNORMAL LOW (ref 25.1–34.0)
MCHC: 31.6 g/dL (ref 31.5–36.0)
MCV: 77.2 fL — ABNORMAL LOW (ref 79.5–101.0)
MONO#: 0.6 10*3/uL (ref 0.1–0.9)
MONO%: 5.9 % (ref 0.0–14.0)
NEUT#: 6.6 10*3/uL — ABNORMAL HIGH (ref 1.5–6.5)
NEUT%: 61.7 % (ref 38.4–76.8)
Platelets: 378 10*3/uL (ref 145–400)
RBC: 5.34 10*6/uL (ref 3.70–5.45)
RDW: 17 % — ABNORMAL HIGH (ref 11.2–14.5)
Retic %: 1.15 % (ref 0.70–2.10)
Retic Ct Abs: 61.41 10*3/uL (ref 33.70–90.70)
WBC: 10.7 10*3/uL — ABNORMAL HIGH (ref 3.9–10.3)
lymph#: 3 10*3/uL (ref 0.9–3.3)

## 2014-06-01 LAB — IRON AND TIBC CHCC
%SAT: 11 % — ABNORMAL LOW (ref 21–57)
Iron: 44 ug/dL (ref 41–142)
TIBC: 403 ug/dL (ref 236–444)
UIBC: 359 ug/dL (ref 120–384)

## 2014-06-01 LAB — FERRITIN CHCC: Ferritin: 14 ng/ml (ref 9–269)

## 2014-06-02 ENCOUNTER — Telehealth: Payer: Self-pay | Admitting: *Deleted

## 2014-06-02 NOTE — Telephone Encounter (Signed)
Informed pt of anemia improved on oral iron and recommendation by Dr. Alvy Bimler to continue same dose iron and f/u w/ PCP to recheck CBC and iron level in 3 months.  No need to return here unless her anemia returns or iron level worsens.  Instructed pt to call Dr. Calton Dach nurse for return visit if needed in future.  Pt states comfortable with this plan.  She will follow w/ PCP and call us if needed.  Appt w/ Dr. Alvy Bimler on 8/20 canceled.

## 2014-06-02 NOTE — Telephone Encounter (Signed)
Message copied by Cathlean Cower on Sat Jun 02, 2014  1:10 PM ------      Message from: [facility], [place]      Created: Fri Jun 01, 2014  7:23 PM      Regarding: cbc result       She is doing well with oral iron, no longer anemic but still iron deficient. Continue the same dose iron and recommend recheck again in 3 months with PCP. OK to cancel appointment with me next week if she is comfortable with the plan      ----- Message -----         From: [facility]         Sent: 06/01/2014  10:33 AM           To: Heath Lark, MD                   ------

## 2014-06-07 ENCOUNTER — Ambulatory Visit: Payer: Medicaid Other | Admitting: Hematology and Oncology

## 2014-06-15 ENCOUNTER — Other Ambulatory Visit: Payer: Self-pay | Admitting: Medical

## 2014-06-15 ENCOUNTER — Telehealth: Payer: Self-pay | Admitting: Internal Medicine

## 2014-06-15 MED ORDER — MELOXICAM 7.5 MG PO TABS: 7.5000 mg | ORAL_TABLET | Freq: Every day | ORAL | Status: DC

## 2014-06-15 NOTE — Telephone Encounter (Signed)
Refill request for meloxicam 7.5mg  to [facility]

## 2014-06-26 DIAGNOSIS — H4011X0 Primary open-angle glaucoma, stage unspecified: Secondary | ICD-10-CM | POA: Diagnosis not present

## 2014-06-26 DIAGNOSIS — H2510 Age-related nuclear cataract, unspecified eye: Secondary | ICD-10-CM | POA: Diagnosis not present

## 2014-06-27 ENCOUNTER — Telehealth: Payer: Self-pay | Admitting: Medical

## 2014-06-27 ENCOUNTER — Ambulatory Visit (INDEPENDENT_AMBULATORY_CARE_PROVIDER_SITE_OTHER): Payer: Medicare Other | Admitting: Medical

## 2014-06-27 ENCOUNTER — Encounter: Payer: Self-pay | Admitting: Medical

## 2014-06-27 VITALS — BP 120/80 | HR 86 | Temp 97.9°F | Resp 14 | Wt 206.0 lb

## 2014-06-27 DIAGNOSIS — R300 Dysuria: Secondary | ICD-10-CM

## 2014-06-27 DIAGNOSIS — R5383 Other fatigue: Secondary | ICD-10-CM

## 2014-06-27 DIAGNOSIS — E1149 Type 2 diabetes mellitus with other diabetic neurological complication: Secondary | ICD-10-CM

## 2014-06-27 DIAGNOSIS — I10 Essential (primary) hypertension: Secondary | ICD-10-CM

## 2014-06-27 DIAGNOSIS — M4310 Spondylolisthesis, site unspecified: Secondary | ICD-10-CM | POA: Diagnosis not present

## 2014-06-27 DIAGNOSIS — E785 Hyperlipidemia, unspecified: Secondary | ICD-10-CM

## 2014-06-27 DIAGNOSIS — R0989 Other specified symptoms and signs involving the circulatory and respiratory systems: Secondary | ICD-10-CM

## 2014-06-27 DIAGNOSIS — R5381 Other malaise: Secondary | ICD-10-CM

## 2014-06-27 DIAGNOSIS — H409 Unspecified glaucoma: Secondary | ICD-10-CM

## 2014-06-27 DIAGNOSIS — D509 Iron deficiency anemia, unspecified: Secondary | ICD-10-CM | POA: Diagnosis not present

## 2014-06-27 DIAGNOSIS — M47817 Spondylosis without myelopathy or radiculopathy, lumbosacral region: Secondary | ICD-10-CM | POA: Diagnosis not present

## 2014-06-27 DIAGNOSIS — I739 Peripheral vascular disease, unspecified: Secondary | ICD-10-CM

## 2014-06-27 DIAGNOSIS — D72829 Elevated white blood cell count, unspecified: Secondary | ICD-10-CM | POA: Diagnosis not present

## 2014-06-27 DIAGNOSIS — G894 Chronic pain syndrome: Secondary | ICD-10-CM

## 2014-06-27 DIAGNOSIS — IMO0002 Reserved for concepts with insufficient information to code with codable children: Secondary | ICD-10-CM | POA: Diagnosis not present

## 2014-06-27 LAB — POCT URINALYSIS DIPSTICK
Bilirubin, UA: NEGATIVE
Ketones, UA: NEGATIVE
Nitrite, UA: POSITIVE
Spec Grav, UA: 1.01
Urobilinogen, UA: NEGATIVE
pH, UA: 6

## 2014-06-27 LAB — COMPREHENSIVE METABOLIC PANEL
ALT: 40 U/L — ABNORMAL HIGH (ref 0–35)
AST: 23 U/L (ref 0–37)
Albumin: 4.5 g/dL (ref 3.5–5.2)
Alkaline Phosphatase: 101 U/L (ref 39–117)
BUN: 21 mg/dL (ref 6–23)
CO2: 28 mEq/L (ref 19–32)
Calcium: 10.3 mg/dL (ref 8.4–10.5)
Chloride: 100 mEq/L (ref 96–112)
Creat: 0.72 mg/dL (ref 0.50–1.10)
Glucose, Bld: 132 mg/dL — ABNORMAL HIGH (ref 70–99)
Potassium: 4.9 mEq/L (ref 3.5–5.3)
Sodium: 139 mEq/L (ref 135–145)
Total Bilirubin: 0.4 mg/dL (ref 0.2–1.2)
Total Protein: 7.2 g/dL (ref 6.0–8.3)

## 2014-06-27 LAB — LIPID PANEL
Cholesterol: 157 mg/dL (ref 0–200)
HDL: 58 mg/dL (ref >39)
LDL Cholesterol: 63 mg/dL (ref 0–99)
Total CHOL/HDL Ratio: 2.7 Ratio
Triglycerides: 178 mg/dL — ABNORMAL HIGH (ref <150)
VLDL: 36 mg/dL (ref 0–40)

## 2014-06-27 LAB — HEMOGLOBIN A1C
Hgb A1c MFr Bld: 7.2 % — ABNORMAL HIGH (ref <5.7)
Mean Plasma Glucose: 160 mg/dL — ABNORMAL HIGH (ref <117)

## 2014-06-27 MED ORDER — HYDROCODONE-ACETAMINOPHEN 5-325 MG PO TABS: 1.0000 | ORAL_TABLET | Freq: Four times a day (QID) | ORAL | Status: DC | PRN

## 2014-06-27 NOTE — Telephone Encounter (Signed)
ABI's are scheduled. CLS

## 2014-06-27 NOTE — Telephone Encounter (Signed)
pls set up for ABIs.

## 2014-06-27 NOTE — Progress Notes (Signed)
Subjective: Has questions about the hip xray she had in June. She highlighted something she wants me to address on the reports  Since last visit she saw Dr. Gorsuch/hematology for iron deficiency anemia and leukocytosis.  She seems to be responding well to [facility], had endoscopy in August, and is due for repeat CBC in 2 months  Her main concern today is ongoing fatigue, feels no energy all the time, doesn't feel rested, however no shortness of breath or chest pain.  When she saw Dr. Yaakov Guthrie there was concern for decreased pedal pulses.  She does note pain in legs with walking, relived with rest.  Can't even go 100yards without claudication pain in legs.  Doesn't recall prior ABIs.   Cardiac cath 2008, reportedly normal.  Had stress test at that time.    Chronic pain-continues on hydrocodone with some relief but lately gets pain that comes down front of thighs, goes down into pelvis/groin area.   Doesn't thinks its coming from the back however she does see Dr. Vertell Limber neurosurgery today in followup  Had EGD in August with Dr. Yaakov Guthrie in [place].  Of note, Dr. Yaakov Guthrie left [place], no longer at [facility]  reportedly biopsy was normal.     Diabetes since last visit compliant with medications which include metformin 1000 mg twice daily, Invokana 100 mg daily, bydureon weekly.  Glucose averaging 132, checks fasting  Since last visit saw Dr. Delman Cheadle and was referred to Dr. Katy Fitch for cataract procedure.  Still being treated for end stage glaucoma  No other c/o.  Past Medical History  Diagnosis Date  . Hypertension   . Hyperlipidemia   . Glaucoma   . Urinary incontinence   . Chronic back pain   . Osteoarthritis   . Edema   . Female bladder prolapse   . Allergy   . Migraine   . GERD (gastroesophageal reflux disease)   . Diverticulosis   . Chronic diarrhea     Colestipol therapy  . Adrenal mass, right     01/2010, MRI abdomen  . Blood transfusion 1970  . Vitamin D deficiency   .  Rheumatic fever     age 8  . Diverticulitis   . Fatty liver   . H/O hiatal hernia   . Blind right eye     since childhood  . Asthma     "as a teenager"  . Type II diabetes mellitus   . Kidney stones   . History of UTI     remote past  . H/O mammogram 07/2012    normal  . H/O bone density study 06/03/2011  . Hypothyroidism   . History of uterine cancer     s/p hysterectomy  . TMJ (dislocation of temporomandibular joint)     Hx: of     Objective: BP 120/80  Pulse 86  Temp(Src) 97.9 F (36.6 C) (Oral)  Resp 14  Wt 206 lb (93.441 kg)  BP Readings from Last 3 Encounters:  06/27/14 120/80  05/02/14 164/75  04/05/14 120/78   Wt Readings from Last 3 Encounters:  06/27/14 206 lb (93.441 kg)  05/02/14 207 lb 8 oz (94.121 kg)  04/05/14 207 lb (93.895 kg)   Gen: wd, wn, nad Heart: RRR, normal S1, S2, no murmurs Lungs clear Decreased pedal pulses in general Ext: mild 1+ bilat nonpitting edema of legs, no cyanosis, no clubbing Neuro: decreased sensation of bilat feet throughout with monofilament Several small skin lesions with erythema and some ulcerated of bilat  LE, chronic for her Neck: supple, no mass, no thyromegaly, no lymphadenopathy   Adult ECG Report  Indication: fatigue  Rate: 85 bpm  Rhythm: Sinus rhythm with premature atrial complexes  QRS Axis: -21 degrees  PR Interval: 170ms  QRS Duration: 63ms  QTc: 422ms  Conduction Disturbances: none  Other Abnormalities: none  Patient's cardiac risk factors are: advanced age (older than 49 for men, 55 for women), diabetes mellitus, dyslipidemia, hypertension, obesity (BMI >= 30 kg/m2) and sedentary lifestyle.  EKG comparison: 10/2013, no acute changes  Narrative Interpretation: PACs      Assessment: Encounter Diagnoses  Name Primary?  . Type II or unspecified type diabetes mellitus with neurological manifestations, not stated as uncontrolled Yes  . Other malaise and fatigue   . Decreased pulses in feet   .  Claudication   . Chronic pain syndrome   . Hyperlipidemia   . Glaucoma   . Essential hypertension, benign   . Anemia, iron deficiency   . Dysuria    Plan: I reviewed the x-ray report of her left hip and labs that she had numerous questions about and answered her questions  Diabetes type 2-labs today, continue current medications which include metformin 1000 mg twice daily, Bydureon once weekly,Invokana 100 mg daily.  continue diabetic diet, exercise when possible  Fatigue-discussed causes, labs today, consider reevaluation with cardiology and/or sleep study if no obvious cause found  Decreased pulses, claudication-labs today, we'll set up for ABIs  Chronic pain syndrome continue hydrocodone 3 times daily for pain  Hyperlipidemia-labs today, continue current medication  Glaucoma -reviewed recent ophthalmology results and she has been referred to Dr. Katy Fitch for additional treatment  Hypertension-controlled on current medication  Anemia, leukocytosis-reviewed recent hematology notes.  She'll be due for repeat CBC in 2 months, continue Fergon.  We will request the recent pathology and endoscopy results from Dr. Yaakov Guthrie from 05/21/2014  Dysuria-urine culture sent  Advise she get high-dose flu shot when it becomes available

## 2014-06-28 ENCOUNTER — Other Ambulatory Visit: Payer: Self-pay | Admitting: Medical

## 2014-06-28 LAB — TSH: TSH: 1.491 u[IU]/mL (ref 0.350–4.500)

## 2014-06-28 MED ORDER — EXENATIDE ER 2 MG ~~LOC~~ PEN: 2.0000 mg | PEN_INJECTOR | SUBCUTANEOUS | Status: DC

## 2014-06-28 MED ORDER — METFORMIN HCL 1000 MG PO TABS: 1000.0000 mg | ORAL_TABLET | Freq: Two times a day (BID) | ORAL | Status: DC

## 2014-06-28 MED ORDER — DULOXETINE HCL 60 MG PO CPEP: 60.0000 mg | ORAL_CAPSULE | Freq: Two times a day (BID) | ORAL | Status: DC

## 2014-06-28 MED ORDER — CANAGLIFLOZIN 300 MG PO TABS: 1.0000 | ORAL_TABLET | Freq: Every day | ORAL | Status: DC

## 2014-06-29 ENCOUNTER — Other Ambulatory Visit: Payer: Self-pay | Admitting: Medical

## 2014-06-29 MED ORDER — CEPHALEXIN 500 MG PO CAPS: 500.0000 mg | ORAL_CAPSULE | Freq: Three times a day (TID) | ORAL | Status: DC

## 2014-06-30 LAB — URINE CULTURE: Colony Count: >=100000

## 2014-07-02 ENCOUNTER — Encounter: Payer: Self-pay | Admitting: Medical

## 2014-07-03 ENCOUNTER — Encounter: Payer: Self-pay | Admitting: Medical

## 2014-07-05 ENCOUNTER — Ambulatory Visit
Admission: RE | Admit: 2014-07-05 | Discharge: 2014-07-05 | Disposition: A | Discharge status: Home / Self Care | Payer: Medicare Other | Source: Ambulatory Visit | Attending: Medical | Admitting: Medical

## 2014-07-05 ENCOUNTER — Other Ambulatory Visit: Payer: Self-pay | Admitting: Neurosurgery

## 2014-07-05 DIAGNOSIS — I739 Peripheral vascular disease, unspecified: Secondary | ICD-10-CM | POA: Diagnosis not present

## 2014-07-05 DIAGNOSIS — R0989 Other specified symptoms and signs involving the circulatory and respiratory systems: Secondary | ICD-10-CM | POA: Diagnosis not present

## 2014-07-05 DIAGNOSIS — M5416 Radiculopathy, lumbar region: Secondary | ICD-10-CM

## 2014-07-10 DIAGNOSIS — H4011X0 Primary open-angle glaucoma, stage unspecified: Secondary | ICD-10-CM | POA: Diagnosis not present

## 2014-07-10 DIAGNOSIS — Q998 Other specified chromosome abnormalities: Secondary | ICD-10-CM | POA: Diagnosis not present

## 2014-07-10 DIAGNOSIS — H40039 Anatomical narrow angle, unspecified eye: Secondary | ICD-10-CM | POA: Diagnosis not present

## 2014-07-10 DIAGNOSIS — H2510 Age-related nuclear cataract, unspecified eye: Secondary | ICD-10-CM | POA: Diagnosis not present

## 2014-07-16 DIAGNOSIS — H5700 Unspecified anomaly of pupillary function: Secondary | ICD-10-CM | POA: Diagnosis not present

## 2014-07-16 DIAGNOSIS — H2510 Age-related nuclear cataract, unspecified eye: Secondary | ICD-10-CM | POA: Diagnosis not present

## 2014-07-17 ENCOUNTER — Ambulatory Visit
Admission: RE | Admit: 2014-07-17 | Discharge: 2014-07-17 | Disposition: A | Discharge status: Home / Self Care | Payer: Medicare Other | Source: Ambulatory Visit | Attending: Neurosurgery | Admitting: Neurosurgery

## 2014-07-17 DIAGNOSIS — M5126 Other intervertebral disc displacement, lumbar region: Secondary | ICD-10-CM | POA: Diagnosis not present

## 2014-07-17 DIAGNOSIS — M47817 Spondylosis without myelopathy or radiculopathy, lumbosacral region: Secondary | ICD-10-CM | POA: Diagnosis not present

## 2014-07-17 DIAGNOSIS — M5416 Radiculopathy, lumbar region: Secondary | ICD-10-CM

## 2014-07-17 DIAGNOSIS — M4310 Spondylolisthesis, site unspecified: Secondary | ICD-10-CM | POA: Diagnosis not present

## 2014-07-17 MED ORDER — GADOBENATE DIMEGLUMINE 529 MG/ML IV SOLN
19.0000 mL | Freq: Once | INTRAVENOUS | Status: AC | PRN
  Administered 2014-07-17: 19 mL via INTRAVENOUS

## 2014-07-18 ENCOUNTER — Encounter: Payer: Self-pay | Admitting: Medical

## 2014-07-19 ENCOUNTER — Encounter (HOSPITAL_COMMUNITY): Payer: Self-pay | Admitting: Emergency Medicine

## 2014-07-19 ENCOUNTER — Emergency Department (HOSPITAL_COMMUNITY)
Admission: EM | Admit: 2014-07-19 | Discharge: 2014-07-20 | Disposition: A | Discharge status: Home / Self Care | Payer: Medicare Other | Attending: Emergency Medicine | Admitting: Emergency Medicine

## 2014-07-19 DIAGNOSIS — Z7952 Long term (current) use of systemic steroids: Secondary | ICD-10-CM | POA: Diagnosis not present

## 2014-07-19 DIAGNOSIS — Z8742 Personal history of other diseases of the female genital tract: Secondary | ICD-10-CM | POA: Diagnosis not present

## 2014-07-19 DIAGNOSIS — Z87442 Personal history of urinary calculi: Secondary | ICD-10-CM | POA: Insufficient documentation

## 2014-07-19 DIAGNOSIS — H409 Unspecified glaucoma: Secondary | ICD-10-CM | POA: Diagnosis not present

## 2014-07-19 DIAGNOSIS — M545 Low back pain: Secondary | ICD-10-CM | POA: Diagnosis not present

## 2014-07-19 DIAGNOSIS — Z79899 Other long term (current) drug therapy: Secondary | ICD-10-CM | POA: Diagnosis not present

## 2014-07-19 DIAGNOSIS — H5441 Blindness, right eye, normal vision left eye: Secondary | ICD-10-CM | POA: Insufficient documentation

## 2014-07-19 DIAGNOSIS — I10 Essential (primary) hypertension: Secondary | ICD-10-CM | POA: Insufficient documentation

## 2014-07-19 DIAGNOSIS — E785 Hyperlipidemia, unspecified: Secondary | ICD-10-CM | POA: Insufficient documentation

## 2014-07-19 DIAGNOSIS — Z791 Long term (current) use of non-steroidal anti-inflammatories (NSAID): Secondary | ICD-10-CM | POA: Insufficient documentation

## 2014-07-19 DIAGNOSIS — K219 Gastro-esophageal reflux disease without esophagitis: Secondary | ICD-10-CM | POA: Diagnosis not present

## 2014-07-19 DIAGNOSIS — Z9889 Other specified postprocedural states: Secondary | ICD-10-CM | POA: Insufficient documentation

## 2014-07-19 DIAGNOSIS — M549 Dorsalgia, unspecified: Secondary | ICD-10-CM

## 2014-07-19 DIAGNOSIS — M1990 Unspecified osteoarthritis, unspecified site: Secondary | ICD-10-CM | POA: Insufficient documentation

## 2014-07-19 DIAGNOSIS — J45909 Unspecified asthma, uncomplicated: Secondary | ICD-10-CM | POA: Insufficient documentation

## 2014-07-19 DIAGNOSIS — E559 Vitamin D deficiency, unspecified: Secondary | ICD-10-CM | POA: Diagnosis not present

## 2014-07-19 DIAGNOSIS — E119 Type 2 diabetes mellitus without complications: Secondary | ICD-10-CM | POA: Insufficient documentation

## 2014-07-19 DIAGNOSIS — Z87440 Personal history of urinary (tract) infections: Secondary | ICD-10-CM | POA: Diagnosis not present

## 2014-07-19 DIAGNOSIS — G8929 Other chronic pain: Secondary | ICD-10-CM | POA: Insufficient documentation

## 2014-07-19 DIAGNOSIS — Z8542 Personal history of malignant neoplasm of other parts of uterus: Secondary | ICD-10-CM | POA: Diagnosis not present

## 2014-07-19 DIAGNOSIS — R1031 Right lower quadrant pain: Secondary | ICD-10-CM | POA: Diagnosis not present

## 2014-07-19 LAB — CBC WITH DIFFERENTIAL/PLATELET
Basophils Absolute: 0.1 10*3/uL (ref 0.0–0.1)
Basophils Relative: 1 % (ref 0–1)
Eosinophils Absolute: 0.5 10*3/uL (ref 0.0–0.7)
Eosinophils Relative: 5 % (ref 0–5)
HCT: 42.2 % (ref 36.0–46.0)
Hemoglobin: 13.6 g/dL (ref 12.0–15.0)
Lymphocytes Relative: 36 % (ref 12–46)
Lymphs Abs: 3.6 10*3/uL (ref 0.7–4.0)
MCH: 25.6 pg — ABNORMAL LOW (ref 26.0–34.0)
MCHC: 32.2 g/dL (ref 30.0–36.0)
MCV: 79.5 fL (ref 78.0–100.0)
Monocytes Absolute: 1.1 10*3/uL — ABNORMAL HIGH (ref 0.1–1.0)
Monocytes Relative: 11 % (ref 3–12)
Neutro Abs: 4.9 10*3/uL (ref 1.7–7.7)
Neutrophils Relative %: 47 % (ref 43–77)
Platelets: 312 10*3/uL (ref 150–400)
RBC: 5.31 MIL/uL — ABNORMAL HIGH (ref 3.87–5.11)
RDW: 15.1 % (ref 11.5–15.5)
WBC: 10.1 10*3/uL (ref 4.0–10.5)

## 2014-07-19 MED ORDER — MORPHINE SULFATE 4 MG/ML IJ SOLN
6.0000 mg | Freq: Once | INTRAMUSCULAR | Status: AC
  Administered 2014-07-19: 6 mg via INTRAVENOUS
  Filled 2014-07-19: qty 2

## 2014-07-19 MED ORDER — SODIUM CHLORIDE 0.9 % IV BOLUS (SEPSIS)
1000.0000 mL | Freq: Once | INTRAVENOUS | Status: AC
  Administered 2014-07-19: 1000 mL via INTRAVENOUS

## 2014-07-19 MED ORDER — KETOROLAC TROMETHAMINE 30 MG/ML IJ SOLN
30.0000 mg | Freq: Once | INTRAMUSCULAR | Status: AC
  Administered 2014-07-20: 30 mg via INTRAVENOUS
  Filled 2014-07-19: qty 1

## 2014-07-19 MED ORDER — MORPHINE SULFATE 4 MG/ML IJ SOLN
6.0000 mg | Freq: Once | INTRAMUSCULAR | Status: AC
  Administered 2014-07-20: 6 mg via INTRAVENOUS
  Filled 2014-07-19: qty 2

## 2014-07-19 NOTE — ED Provider Notes (Signed)
CSN: [id number]     Arrival date & time 07/19/14  2146 History   First MD Initiated Contact with Patient 07/19/14 2255     Chief Complaint  Patient presents with  . Back Pain     (Consider location/radiation/quality/duration/timing/severity/associated sxs/prior Treatment) HPI Robyn Hill is a 68 y.o. female with past medical history of chronic back pain status post 2 back surgeries including fusions of L2-L3 and L4-L5, coming in with back pain. Patient states has gotten worse over the last 2 months. Patient denies any recent trauma to the area. She recently had an MRI that revealed compression of the L3 nerve root bilaterally. Norco at home without any relief. He has an appointment to see Dr. Vertell Limber next Wednesday. She states her pain started to her right hip and radiates to her right knee. It is sharp and stabbing in nature. She denies any "nerve pain medication" that she takes.  She denies any numbness tingling or weakness in that extremity. She denies any urinary symptoms including dysuria or hematuria or change in frequency. She is no saddle anesthesia or incontinence. He says denying fevers chills or recent infections. She has no chest pain shortness of breath, patient has no further complaints.  10 Systems reviewed and are negative for acute change except as noted in the HPI.     Past Medical History  Diagnosis Date  . Hypertension   . Hyperlipidemia   . Glaucoma   . Urinary incontinence   . Chronic back pain   . Osteoarthritis   . Edema   . Female bladder prolapse   . Allergy   . Migraine   . GERD (gastroesophageal reflux disease)   . Diverticulosis   . Chronic diarrhea     Colestipol therapy  . Adrenal mass, right     01/2010, MRI abdomen  . Blood transfusion 1970  . Vitamin D deficiency   . Rheumatic fever     age 82  . Diverticulitis   . Fatty liver   . H/O hiatal hernia   . Blind right eye     since childhood  . Asthma     "as a teenager"  . Type II diabetes  mellitus   . Kidney stones   . History of UTI     remote past  . H/O mammogram 07/2012    normal  . H/O bone density study 06/03/2011  . Hypothyroidism   . History of uterine cancer     s/p hysterectomy  . TMJ (dislocation of temporomandibular joint)     Hx: of   Past Surgical History  Procedure Laterality Date  . Tonsillectomy and adenoidectomy  1952  . Minor laceration repair      right eye  . Cardiac catheterization  11/2006  . Liver biopsy  04/1999  . Edsi for back pain  last 01/2009    multiple x 5  . Steroid injections of hip  last 02/2009    multiple x 4  . Eye surgery      Bil Blind R eye  . Posterior fusion lumbar spine  04/05/12  . Breast biopsy  08/1997    right  . Kidney stone surgery  1979  . Cholecystectomy    . Trabeculectomy  1996    bilaterally; "for glaucoma"  . Dilation and curettage of uterus  1980  . Back surgery    . Colonoscopy  10/2008    polyps  . Abdominal hysterectomy  1980    no further pap  smears needed  . Upper gi endoscopy  05/21/14    food, retained in the body of the stomach   Family History  Problem Relation Age of Onset  . Anesthesia problems Neg Hx   . Cancer - Colon Brother   . Heart disease Brother   . Hypertension Other   . Diabetes Other   . Cancer Mother     aplastic anemia  . Cancer - Colon Brother     colon ca   History  Substance Use Topics  . Smoking status: Never Smoker   . Smokeless tobacco: Never Used  . Alcohol Use: Yes     Comment: rare   OB History   Grav Para Term Preterm Abortions TAB SAB Ect Mult Living                 Review of Systems    Allergies  Sunflowerseed oil; Kiwi extract; and Oxycodone  Home Medications   Prior to Admission medications   Medication Sig Start Date End Date Taking? Authorizing Provider  atorvastatin (LIPITOR) 40 MG tablet Take 40 mg by mouth at bedtime.   Yes Historical Provider, MD  Canagliflozin (INVOKANA) 300 MG TABS Take 300 mg by mouth daily.   Yes Historical  Provider, MD  cholecalciferol (VITAMIN D) 1000 UNITS tablet Take 1,000 Units by mouth daily.   Yes Historical Provider, MD  dexlansoprazole (DEXILANT) 60 MG capsule Take 60 mg by mouth 2 (two) times daily.   Yes Historical Provider, MD  dorzolamide-timolol (COSOPT) 22.3-6.8 MG/ML ophthalmic solution Place 1 drop into both eyes 2 (two) times daily.   Yes Historical Provider, MD  DULoxetine (CYMBALTA) 60 MG capsule Take 60 mg by mouth 2 (two) times daily.   Yes Historical Provider, MD  enalapril (VASOTEC) 20 MG tablet Take 20 mg by mouth daily.   Yes Historical Provider, MD  Exenatide ER 2 MG PEN Inject 2 mg into the skin every Monday.   Yes Historical Provider, MD  ferrous gluconate (FERGON) 324 MG tablet Take 324 mg by mouth daily with breakfast.   Yes Historical Provider, MD  hydrochlorothiazide (HYDRODIURIL) 25 MG tablet Take 25 mg by mouth daily.   Yes Historical Provider, MD  HYDROcodone-acetaminophen (NORCO/VICODIN) 5-325 MG per tablet Take 1-2 tablets by mouth every 6 (six) hours as needed for moderate pain.   Yes Historical Provider, MD  ketorolac (ACULAR) 0.5 % ophthalmic solution Place 1 drop into the right eye 4 (four) times daily.   Yes Historical Provider, MD  latanoprost (XALATAN) 0.005 % ophthalmic solution Place 1 drop into the right eye at bedtime.   Yes Historical Provider, MD  meloxicam (MOBIC) 7.5 MG tablet Take 7.5 mg by mouth daily.   Yes Historical Provider, MD  metFORMIN (GLUCOPHAGE) 1000 MG tablet Take 1,000 mg by mouth 2 (two) times daily with a meal.   Yes Historical Provider, MD  moxifloxacin (VIGAMOX) 0.5 % ophthalmic solution Place 1 drop into the right eye 4 (four) times daily.   Yes Historical Provider, MD  mupirocin ointment (BACTROBAN) 2 % Place 1 application into the nose 2 (two) times daily as needed (for infection on legs/arms).   Yes Historical Provider, MD  prednisoLONE acetate (PRED FORTE) 1 % ophthalmic suspension Place 1 drop into the right eye 4 (four) times  daily.   Yes Historical Provider, MD   BP 167/67  Pulse 67  Temp(Src) 97.8 F (36.6 C) (Oral)  Resp 14  Ht 5\' 5"  (1.651 m)  Wt 208 lb (94.348  kg)  BMI 34.61 kg/m2  SpO2 100% Physical Exam  Nursing note and vitals reviewed. Constitutional: She is oriented to person, place, and time. She appears well-developed and well-nourished. She appears distressed.  Tearful from pain.  HENT:  Head: Normocephalic and atraumatic.  Nose: Nose normal.  Mouth/Throat: Oropharynx is clear and moist. No oropharyngeal exudate.  Eyes: Conjunctivae and EOM are normal. Pupils are equal, round, and reactive to light. No scleral icterus.  Neck: Normal range of motion. Neck supple. No JVD present. No tracheal deviation present. No thyromegaly present.  Cardiovascular: Normal rate, regular rhythm and normal heart sounds.  Exam reveals no gallop and no friction rub.   No murmur heard. Pulmonary/Chest: Effort normal and breath sounds normal. No respiratory distress. She has no wheezes. She exhibits no tenderness.  Abdominal: Soft. Bowel sounds are normal. She exhibits no distension and no mass. There is tenderness. There is no rebound and no guarding.  No CVA tenderness. Right lower quadrant tenderness to palpation.  Musculoskeletal: Normal range of motion. She exhibits no edema and no tenderness.  Midline lumbar surgical wound. No midline tenderness. Straight leg raise test is negative bilaterally. No tenderness of the right hip her right thigh.  Lymphadenopathy:    She has no cervical adenopathy.  Neurological: She is alert and oriented to person, place, and time. No cranial nerve deficit. She exhibits normal muscle tone. Coordination normal.  5 out of 5 strength x4 extremities. Normal sensation x4 extremities. Normal cerebellar testing.  Skin: Skin is warm and dry. No rash noted. She is not diaphoretic. No erythema. No pallor.    ED Course  Procedures (including critical care time) Labs Review Labs Reviewed   CBC WITH DIFFERENTIAL - Abnormal; Notable for the following:    RBC 5.31 (*)    MCH 25.6 (*)    Monocytes Absolute 1.1 (*)    All other components within normal limits  COMPREHENSIVE METABOLIC PANEL - Abnormal; Notable for the following:    Glucose, Bld 139 (*)    ALT 37 (*)    Total Bilirubin 0.2 (*)    GFR calc non Af Amer 89 (*)    All other components within normal limits  LIPASE, BLOOD  URINALYSIS, ROUTINE W REFLEX MICROSCOPIC    Imaging Review Ct Abdomen Pelvis W Contrast  07/20/2014   CLINICAL DATA:  Right lower quadrant pain radiating into the lower back and down the legs. Recent kidney infection.  EXAM: CT ABDOMEN AND PELVIS WITH CONTRAST  TECHNIQUE: Multidetector CT imaging of the abdomen and pelvis was performed using the standard protocol following bolus administration of intravenous contrast.  CONTRAST:  175mL OMNIPAQUE IOHEXOL 300 MG/ML  SOLN  COMPARISON:  03/06/1999 well  FINDINGS: Lung bases are clear allowing for respiratory motion artifact. Small amount of residual contrast material in the esophagus may indicate reflux or decreased motility. Coronary artery calcifications.  Surgical absence of the gallbladder. Mild bile duct dilatation is likely normal for postoperative physiology. The liver, spleen, pancreas, kidneys, abdominal aorta, inferior vena cava, and retroperitoneal lymph nodes are unremarkable. There is a right adrenal gland nodule measuring 2.2 cm diameter. This was present on previous CT scan from 03/06/2011 and is unchanged. Likely benign. Stomach and small bowel are normal for degree of distention. Stool-filled colon without dilatation. No free air or free fluid in the abdomen.  Pelvis: Pelvic floor laxity with cystocele. Bladder wall is not thickened. Appendix is normal. No evidence of diverticulitis. No free air or free fluid in the abdomen.  No abnormal pelvic mass or lymphadenopathy. Postoperative changes and degenerative changes in the lumbar spine. No  destructive bone lesions appreciated.  IMPRESSION: No focal acute process is demonstrated that would account for the history of right lower quadrant pain. Stable appearance of left adrenal gland lesion, likely benign. Pelvic floor laxity with cystocele. Surgical absence of the gallbladder.   Electronically Signed   By: Lucienne Capers M.D.   On: 07/20/2014 03:17     EKG Interpretation None      MDM   Final diagnoses:  None    Patient does emergency department for acute on chronic back pain. MRI reveals L3 compression of the nerve root. We'll treat with IV morphine. Patient states her pain did improve but is now requiring a second dose. Due to patient's right lower quadrant tenderness on exam will obtain CT scan of her abdomen.  Patient was given 2 doses of morphine, followed by with Dilaudid. The Dilaudid finally treated her pain. Patient states her Norco is not helping at home, that she was discharged with 8 tablets of Dilaudid. She is advised to followup with her back surgeon for continued care her back. CT scan does not reveal any account for the right lower quadrant pain. She had one recorded oxygen levels 88%, she never required a oxygen normally she short of breath. I believe this was an unrepresentative value. Her vital signs within her normal limits and she is safe for discharge.    Everlene Balls, MD 07/20/14 [phone]

## 2014-07-19 NOTE — ED Notes (Signed)
Pt. reports chronic low back pain worse these past several days unrelieved by prescription pain medication ,denies urinary discomfort or hematuria , no fall or injury , MRI done 2 days ago .

## 2014-07-20 ENCOUNTER — Emergency Department (HOSPITAL_COMMUNITY): Payer: Medicare Other

## 2014-07-20 ENCOUNTER — Encounter (HOSPITAL_COMMUNITY): Payer: Self-pay

## 2014-07-20 ENCOUNTER — Encounter: Payer: Self-pay | Admitting: Medical

## 2014-07-20 DIAGNOSIS — R1031 Right lower quadrant pain: Secondary | ICD-10-CM | POA: Diagnosis not present

## 2014-07-20 DIAGNOSIS — M545 Low back pain: Secondary | ICD-10-CM | POA: Diagnosis not present

## 2014-07-20 LAB — COMPREHENSIVE METABOLIC PANEL
ALT: 37 U/L — ABNORMAL HIGH (ref 0–35)
AST: 28 U/L (ref 0–37)
Albumin: 4.1 g/dL (ref 3.5–5.2)
Alkaline Phosphatase: 91 U/L (ref 39–117)
Anion gap: 13 (ref 5–15)
BUN: 17 mg/dL (ref 6–23)
CO2: 26 mEq/L (ref 19–32)
Calcium: 9.3 mg/dL (ref 8.4–10.5)
Chloride: 101 mEq/L (ref 96–112)
Creatinine, Ser: 0.66 mg/dL (ref 0.50–1.10)
GFR calc Af Amer: >90 mL/min (ref >90)
GFR calc non Af Amer: 89 mL/min — ABNORMAL LOW (ref >90)
Glucose, Bld: 139 mg/dL — ABNORMAL HIGH (ref 70–99)
Potassium: 4.2 mEq/L (ref 3.7–5.3)
Sodium: 140 mEq/L (ref 137–147)
Total Bilirubin: 0.2 mg/dL — ABNORMAL LOW (ref 0.3–1.2)
Total Protein: 7.7 g/dL (ref 6.0–8.3)

## 2014-07-20 LAB — LIPASE, BLOOD: Lipase: 34 U/L (ref 11–59)

## 2014-07-20 MED ORDER — IOHEXOL 300 MG/ML  SOLN
25.0000 mL | Freq: Once | INTRAMUSCULAR | Status: AC | PRN
  Administered 2014-07-20: 25 mL via ORAL

## 2014-07-20 MED ORDER — IOHEXOL 300 MG/ML  SOLN
100.0000 mL | Freq: Once | INTRAMUSCULAR | Status: AC | PRN
  Administered 2014-07-20: 100 mL via INTRAVENOUS

## 2014-07-20 MED ORDER — HYDROMORPHONE HCL 1 MG/ML IJ SOLN
1.0000 mg | Freq: Once | INTRAMUSCULAR | Status: AC
  Administered 2014-07-20: 1 mg via INTRAVENOUS
  Filled 2014-07-20: qty 1

## 2014-07-20 MED ORDER — HYDROMORPHONE HCL 2 MG PO TABS: 2.0000 mg | ORAL_TABLET | Freq: Two times a day (BID) | ORAL | Status: DC | PRN

## 2014-07-20 NOTE — Discharge Instructions (Signed)
Radicular Pain Robyn Hill, you were seen today for back pain.  Your MRI reveals compression of L3 nerve.  Continue to take pain medication at home as needed and follow up with your back surgeon within 3 days for continued care.  Your primary care doctor may place you on pain medication for nerves.  The CT scan of your abdomen results are below and do not show a cause for your pain.  If your symptoms worsen, come back to the ED for repeat evaluation.   Radicular pain in either the arm or leg is usually from a bulging or herniated disk in the spine. A piece of the herniated disk may press against the nerves as the nerves exit the spine. This causes pain which is felt at the tips of the nerves down the arm or leg. Other causes of radicular pain may include:  Fractures.  Heart disease.  Cancer.  An abnormal and usually degenerative state of the nervous system or nerves (neuropathy). Diagnosis may require CT or MRI scanning to determine the primary cause.  Nerves that start at the neck (nerve roots) may cause radicular pain in the outer shoulder and arm. It can spread down to the thumb and fingers. The symptoms vary depending on which nerve root has been affected. In most cases radicular pain improves with conservative treatment. Neck problems may require physical therapy, a neck collar, or cervical traction. Treatment may take many weeks, and surgery may be considered if the symptoms do not improve.  Conservative treatment is also recommended for sciatica. Sciatica causes pain to radiate from the lower back or buttock area down the leg into the foot. Often there is a history of back problems. Most patients with sciatica are better after 2 to 4 weeks of rest and other supportive care. Short term bed rest can reduce the disk pressure considerably. Sitting, however, is not a good position since this increases the pressure on the disk. You should avoid bending, lifting, and all other activities which make the  problem worse. Traction can be used in severe cases. Surgery is usually reserved for patients who do not improve within the first months of treatment. Only take over-the-counter or prescription medicines for pain, discomfort, or fever as directed by your caregiver. Narcotics and muscle relaxants may help by relieving more severe pain and spasm and by providing mild sedation. Cold or massage can give significant relief. Spinal manipulation is not recommended. It can increase the degree of disc protrusion. Epidural steroid injections are often effective treatment for radicular pain. These injections deliver medicine to the spinal nerve in the space between the protective covering of the spinal cord and back bones (vertebrae). Your caregiver can give you more information about steroid injections. These injections are most effective when given within two weeks of the onset of pain.  You should see your caregiver for follow up care as recommended. A program for neck and back injury rehabilitation with stretching and strengthening exercises is an important part of management.  SEEK IMMEDIATE MEDICAL CARE IF:  You develop increased pain, weakness, or numbness in your arm or leg.  You develop difficulty with bladder or bowel control.  You develop abdominal pain. Document Released: 11/12/2004 Document Revised: 12/28/2011 Document Reviewed: 01/28/2009 [facility] Patient Information 2015 [place], [place]. This information is not intended to replace advice given to you by your health care provider. Make sure you discuss any questions you have with your health care provider.

## 2014-07-23 DIAGNOSIS — Z6835 Body mass index (BMI) 35.0-35.9, adult: Secondary | ICD-10-CM | POA: Diagnosis not present

## 2014-07-23 DIAGNOSIS — M5127 Other intervertebral disc displacement, lumbosacral region: Secondary | ICD-10-CM | POA: Diagnosis not present

## 2014-07-23 DIAGNOSIS — M5417 Radiculopathy, lumbosacral region: Secondary | ICD-10-CM | POA: Diagnosis not present

## 2014-07-23 DIAGNOSIS — M4806 Spinal stenosis, lumbar region: Secondary | ICD-10-CM | POA: Diagnosis not present

## 2014-07-24 DIAGNOSIS — M4806 Spinal stenosis, lumbar region: Secondary | ICD-10-CM | POA: Diagnosis not present

## 2014-07-24 DIAGNOSIS — M5011 Cervical disc disorder with radiculopathy,  high cervical region: Secondary | ICD-10-CM | POA: Diagnosis not present

## 2014-07-24 DIAGNOSIS — M5116 Intervertebral disc disorders with radiculopathy, lumbar region: Secondary | ICD-10-CM | POA: Diagnosis not present

## 2014-07-26 ENCOUNTER — Other Ambulatory Visit (INDEPENDENT_AMBULATORY_CARE_PROVIDER_SITE_OTHER): Payer: Medicare Other

## 2014-07-26 DIAGNOSIS — R8290 Unspecified abnormal findings in urine: Secondary | ICD-10-CM | POA: Diagnosis not present

## 2014-07-26 DIAGNOSIS — Z23 Encounter for immunization: Secondary | ICD-10-CM | POA: Diagnosis not present

## 2014-07-26 LAB — POCT URINALYSIS DIPSTICK
Bilirubin, UA: NEGATIVE
Blood, UA: NEGATIVE
Ketones, UA: NEGATIVE
Leukocytes, UA: NEGATIVE
Nitrite, UA: NEGATIVE
Protein, UA: NEGATIVE
Spec Grav, UA: <=1.005
Urobilinogen, UA: NEGATIVE
pH, UA: 7.5

## 2014-07-27 ENCOUNTER — Encounter: Payer: Self-pay | Admitting: Medical

## 2014-07-30 DIAGNOSIS — Z1231 Encounter for screening mammogram for malignant neoplasm of breast: Secondary | ICD-10-CM | POA: Diagnosis not present

## 2014-07-30 LAB — HM MAMMOGRAPHY

## 2014-07-31 DIAGNOSIS — H2512 Age-related nuclear cataract, left eye: Secondary | ICD-10-CM | POA: Diagnosis not present

## 2014-08-06 DIAGNOSIS — H21562 Pupillary abnormality, left eye: Secondary | ICD-10-CM | POA: Diagnosis not present

## 2014-08-06 DIAGNOSIS — H2512 Age-related nuclear cataract, left eye: Secondary | ICD-10-CM | POA: Diagnosis not present

## 2014-08-06 DIAGNOSIS — H21542 Posterior synechiae (iris), left eye: Secondary | ICD-10-CM | POA: Diagnosis not present

## 2014-08-24 ENCOUNTER — Encounter: Payer: Self-pay | Admitting: Medical

## 2014-08-27 DIAGNOSIS — L282 Other prurigo: Secondary | ICD-10-CM | POA: Diagnosis not present

## 2014-08-27 DIAGNOSIS — L570 Actinic keratosis: Secondary | ICD-10-CM | POA: Diagnosis not present

## 2014-09-08 ENCOUNTER — Other Ambulatory Visit: Payer: Self-pay | Admitting: Medical

## 2014-09-10 ENCOUNTER — Other Ambulatory Visit: Payer: Self-pay | Admitting: Medical

## 2014-09-12 ENCOUNTER — Other Ambulatory Visit: Payer: Self-pay | Admitting: Medical

## 2014-09-17 DIAGNOSIS — M5127 Other intervertebral disc displacement, lumbosacral region: Secondary | ICD-10-CM | POA: Diagnosis not present

## 2014-09-17 DIAGNOSIS — M5137 Other intervertebral disc degeneration, lumbosacral region: Secondary | ICD-10-CM | POA: Diagnosis not present

## 2014-09-17 DIAGNOSIS — M5417 Radiculopathy, lumbosacral region: Secondary | ICD-10-CM | POA: Diagnosis not present

## 2014-09-17 DIAGNOSIS — Z6835 Body mass index (BMI) 35.0-35.9, adult: Secondary | ICD-10-CM | POA: Diagnosis not present

## 2014-09-17 DIAGNOSIS — M4310 Spondylolisthesis, site unspecified: Secondary | ICD-10-CM | POA: Diagnosis not present

## 2014-09-20 ENCOUNTER — Other Ambulatory Visit: Payer: Self-pay | Admitting: Medical

## 2014-10-10 DIAGNOSIS — M4310 Spondylolisthesis, site unspecified: Secondary | ICD-10-CM | POA: Diagnosis not present

## 2014-10-10 DIAGNOSIS — M4806 Spinal stenosis, lumbar region: Secondary | ICD-10-CM | POA: Diagnosis not present

## 2014-10-10 DIAGNOSIS — M5417 Radiculopathy, lumbosacral region: Secondary | ICD-10-CM | POA: Diagnosis not present

## 2014-10-10 DIAGNOSIS — Z6835 Body mass index (BMI) 35.0-35.9, adult: Secondary | ICD-10-CM | POA: Diagnosis not present

## 2014-10-16 DIAGNOSIS — E042 Nontoxic multinodular goiter: Secondary | ICD-10-CM | POA: Diagnosis not present

## 2014-10-17 ENCOUNTER — Other Ambulatory Visit: Payer: Self-pay | Admitting: Medical

## 2014-10-17 NOTE — Telephone Encounter (Signed)
Is this okay to refill? 

## 2014-10-25 ENCOUNTER — Telehealth: Payer: Self-pay | Admitting: Medical

## 2014-10-25 NOTE — Telephone Encounter (Signed)
Pt called and stated that she needs to follow up with a neuro doc that we referred her to. Doc's name is Dr. Ellery Plunk and he is no longer at that practice, [facility] In [place]. She needs a new referral to the same practice but to a different doc. New doc's name is Dr. Jenene Slicker. You can reach that practice at [phone] and pt at [phone].

## 2014-10-25 NOTE — Telephone Encounter (Signed)
Patient needs a referral to Dr. Ladona Horns, who is a Psychologist, sport and exercise an took Dr. Quentin Mulling place. Is this okay? She also needs [place] to provide their office with a track# regarding her Medicaid card and our office?

## 2014-10-25 NOTE — Telephone Encounter (Signed)
Ok, refer

## 2014-10-25 NOTE — Telephone Encounter (Signed)
What is the referral concerning?  Dr. Ellery Plunk was gen surgery, but message says neurology?

## 2014-10-26 ENCOUNTER — Ambulatory Visit (INDEPENDENT_AMBULATORY_CARE_PROVIDER_SITE_OTHER): Payer: Medicare Other | Admitting: Medical

## 2014-10-26 ENCOUNTER — Encounter: Payer: Self-pay | Admitting: Medical

## 2014-10-26 ENCOUNTER — Telehealth: Payer: Self-pay | Admitting: Medical

## 2014-10-26 VITALS — BP 98/60 | HR 70 | Temp 97.9°F | Resp 14 | Wt 210.0 lb

## 2014-10-26 DIAGNOSIS — N3946 Mixed incontinence: Secondary | ICD-10-CM

## 2014-10-26 DIAGNOSIS — E119 Type 2 diabetes mellitus without complications: Secondary | ICD-10-CM | POA: Diagnosis not present

## 2014-10-26 DIAGNOSIS — N3000 Acute cystitis without hematuria: Secondary | ICD-10-CM | POA: Diagnosis not present

## 2014-10-26 DIAGNOSIS — B353 Tinea pedis: Secondary | ICD-10-CM | POA: Diagnosis not present

## 2014-10-26 DIAGNOSIS — Z79899 Other long term (current) drug therapy: Secondary | ICD-10-CM | POA: Diagnosis not present

## 2014-10-26 LAB — POCT URINALYSIS DIPSTICK
Bilirubin, UA: NEGATIVE
Glucose, UA: NEGATIVE
Ketones, UA: NEGATIVE
Nitrite, UA: POSITIVE
Protein, UA: NEGATIVE
Spec Grav, UA: 1.025
Urobilinogen, UA: NEGATIVE
pH, UA: 6

## 2014-10-26 MED ORDER — TERBINAFINE HCL 1 % EX CREA: 1.0000 "application " | TOPICAL_CREAM | Freq: Two times a day (BID) | CUTANEOUS | Status: DC

## 2014-10-26 MED ORDER — CEPHALEXIN 500 MG PO CAPS: 500.0000 mg | ORAL_CAPSULE | Freq: Three times a day (TID) | ORAL | Status: DC

## 2014-10-26 NOTE — Telephone Encounter (Signed)
Patient has an appointment to see Dr. Ladona Horns on 11/14/14 @ 930 am at [facility].

## 2014-10-26 NOTE — Telephone Encounter (Signed)
I fax over her referral to [facility] and they will contact the patient to schedule her appointment

## 2014-10-26 NOTE — Telephone Encounter (Signed)
Refer to Urology for urinary incontinence, prolapse, recent multiple UTI

## 2014-10-26 NOTE — Telephone Encounter (Signed)
Patient is aware of her appointment..

## 2014-10-26 NOTE — Progress Notes (Signed)
Subjective:  Robyn Hill is a 69 y.o. female who complains of possible urinary tract infection.  She has had symptoms for yellow and odorous urine x few days, but urinary spasm since starting Invokana.  Always has urinary pressure, incontinence, hx/o bladder prolapse, frequency feels bulging out in the bladder.  Doesn't take much to have leakage, cough, sneezing, just sitting sometimes urine will leak.  Deneies fever, NVD, no abdominal pain.   No vaginal discharge.  Using nothing for current symptoms.  In the past has used Oxybutynin for incontinence.  No prior urology consult, but requesting one.   Wants to stop invokana given the urinary issues, 2 infections since starting.     Blood sugars running under 130 regularly fasting mornings since being on Metformin 1000mg  BID, Invokana 300mg  daily and bydureon weekly.    Has concerns about foot fungus, lately irritations, flaking of skin, and whitish coloration between toes of both feet.  No other aggravating or relieving factors.  No other c/o.  Past Medical History  Diagnosis Date  . Hypertension   . Hyperlipidemia   . Glaucoma   . Urinary incontinence   . Chronic back pain   . Osteoarthritis   . Edema   . Female bladder prolapse   . Allergy   . Migraine   . GERD (gastroesophageal reflux disease)   . Diverticulosis   . Chronic diarrhea     Colestipol therapy  . Adrenal mass, right     01/2010, MRI abdomen  . Blood transfusion 1970  . Vitamin D deficiency   . Rheumatic fever     age 47  . Diverticulitis   . Fatty liver   . H/O hiatal hernia   . Blind right eye     since childhood  . Asthma     "as a teenager"  . Type II diabetes mellitus   . Kidney stones   . History of UTI     remote past  . H/O mammogram 07/2012    normal  . H/O bone density study 06/03/2011  . Hypothyroidism   . History of uterine cancer     s/p hysterectomy  . TMJ (dislocation of temporomandibular joint)     Hx: of   ROS as in  subjective  Reviewed allergies, medications, past medical, surgical, and social history.    Objective: Filed Vitals:   10/26/14 0927  BP: 98/60  Pulse: 70  Temp: 97.9 F (36.6 C)  Resp: 14    General appearance: alert, no distress, WD/WN, female Abdomen: +bs, soft, non tender, non distended, no masses, no hepatomegaly, no splenomegaly, no bruits Back: no CVA tenderness GU: declined Feet - maceration and skin cracking and flaking between toes suggestive of tinea       Assessment: Encounter Diagnoses  Name Primary?  . Mixed incontinence Yes  . Acute cystitis without hematuria   . Tinea pedis, unspecified laterality   . Diabetes type 2, controlled   . High risk medication use      Plan: Mixed incontinence, self reported prolapse of bladder although declines exam today, recent multiple urinary tract infections-refer to urology  Urinary tract infection per symptoms in urine findings today, urine culture sent, reviewed the recent September urine culture result, begin Keflex  Tinea pedis-discussed foot care, begin Lamisil cream daily and recheck if not improving in the next 2-3 weeks  Diabetes type 2-continue to monitor blood sugars, stop Invokana given the urinary tract issues, Continue metformin 1000 mg twice daily, Bydureon weekly  injection, and if sugars start to rise we'll add on Levemir daily at bedtime, gave sample and discussed proper use in the event we add this on.  High risk med use - stop invokana

## 2014-10-29 DIAGNOSIS — Z1211 Encounter for screening for malignant neoplasm of colon: Secondary | ICD-10-CM | POA: Diagnosis not present

## 2014-10-31 LAB — URINE CULTURE: Colony Count: >=100000

## 2014-11-02 DIAGNOSIS — E041 Nontoxic single thyroid nodule: Secondary | ICD-10-CM | POA: Diagnosis not present

## 2014-11-08 ENCOUNTER — Other Ambulatory Visit: Payer: Self-pay | Admitting: Medical

## 2014-11-12 ENCOUNTER — Telehealth: Payer: Self-pay | Admitting: Internal Medicine

## 2014-11-12 ENCOUNTER — Telehealth: Payer: Self-pay | Admitting: Medical

## 2014-11-12 NOTE — Telephone Encounter (Signed)
Refill request for accucheck lancets and strips accucheck aviva plus #200 test BID [facility] [address]

## 2014-11-12 NOTE — Telephone Encounter (Signed)
Pt states received letter from [facility] regarding Byduron, states not covered by insurance.  Pt has tried numerous medications Metformin (current), Janumet, Invokana (D/C due to UTIs) [place], Novolog & Lady Gary

## 2014-11-13 ENCOUNTER — Other Ambulatory Visit: Payer: Self-pay | Admitting: Family Medicine

## 2014-11-13 ENCOUNTER — Telehealth: Payer: Self-pay | Admitting: Medical

## 2014-11-13 NOTE — Telephone Encounter (Signed)
Left message for pt to call. Pt needs 3 month follow up per shane.

## 2014-11-14 ENCOUNTER — Other Ambulatory Visit: Payer: Self-pay | Admitting: Family Medicine

## 2014-11-14 DIAGNOSIS — D126 Benign neoplasm of colon, unspecified: Secondary | ICD-10-CM | POA: Diagnosis not present

## 2014-11-14 MED ORDER — GLUCOSE BLOOD VI STRP: 1.0000 | ORAL_STRIP | Freq: Once | Status: DC

## 2014-11-14 NOTE — Telephone Encounter (Signed)
FILLS ON TEST STRIPS AND LANCETS WAS SENT TO HER PHARMACY.

## 2014-11-21 ENCOUNTER — Ambulatory Visit (INDEPENDENT_AMBULATORY_CARE_PROVIDER_SITE_OTHER): Payer: Medicare Other | Admitting: Medical

## 2014-11-21 ENCOUNTER — Encounter: Payer: Self-pay | Admitting: Medical

## 2014-11-21 VITALS — BP 122/80 | HR 76 | Temp 97.9°F | Resp 14 | Wt 204.0 lb

## 2014-11-21 DIAGNOSIS — IMO0002 Reserved for concepts with insufficient information to code with codable children: Secondary | ICD-10-CM

## 2014-11-21 DIAGNOSIS — I10 Essential (primary) hypertension: Secondary | ICD-10-CM

## 2014-11-21 DIAGNOSIS — Z13820 Encounter for screening for osteoporosis: Secondary | ICD-10-CM

## 2014-11-21 DIAGNOSIS — E0841 Diabetes mellitus due to underlying condition with diabetic mononeuropathy: Secondary | ICD-10-CM

## 2014-11-21 DIAGNOSIS — M545 Low back pain: Secondary | ICD-10-CM | POA: Diagnosis not present

## 2014-11-21 DIAGNOSIS — M4310 Spondylolisthesis, site unspecified: Secondary | ICD-10-CM | POA: Diagnosis not present

## 2014-11-21 DIAGNOSIS — K219 Gastro-esophageal reflux disease without esophagitis: Secondary | ICD-10-CM

## 2014-11-21 DIAGNOSIS — M5127 Other intervertebral disc displacement, lumbosacral region: Secondary | ICD-10-CM | POA: Diagnosis not present

## 2014-11-21 DIAGNOSIS — Z79899 Other long term (current) drug therapy: Secondary | ICD-10-CM | POA: Diagnosis not present

## 2014-11-21 DIAGNOSIS — E038 Other specified hypothyroidism: Secondary | ICD-10-CM | POA: Diagnosis not present

## 2014-11-21 DIAGNOSIS — Z6835 Body mass index (BMI) 35.0-35.9, adult: Secondary | ICD-10-CM | POA: Diagnosis not present

## 2014-11-21 DIAGNOSIS — E1165 Type 2 diabetes mellitus with hyperglycemia: Secondary | ICD-10-CM

## 2014-11-21 DIAGNOSIS — B353 Tinea pedis: Secondary | ICD-10-CM | POA: Diagnosis not present

## 2014-11-21 DIAGNOSIS — M5417 Radiculopathy, lumbosacral region: Secondary | ICD-10-CM | POA: Diagnosis not present

## 2014-11-21 LAB — POCT URINALYSIS DIPSTICK
Bilirubin, UA: NEGATIVE
Blood, UA: NEGATIVE
Glucose, UA: NEGATIVE
Ketones, UA: NEGATIVE
Leukocytes, UA: NEGATIVE
Nitrite, UA: NEGATIVE
Protein, UA: NEGATIVE
Spec Grav, UA: 1.015
Urobilinogen, UA: NEGATIVE
pH, UA: 7.5

## 2014-11-21 LAB — COMPREHENSIVE METABOLIC PANEL
ALT: 26 U/L (ref 0–35)
AST: 21 U/L (ref 0–37)
Albumin: 4.2 g/dL (ref 3.5–5.2)
Alkaline Phosphatase: 85 U/L (ref 39–117)
BUN: 17 mg/dL (ref 6–23)
CO2: 28 mEq/L (ref 19–32)
Calcium: 9.9 mg/dL (ref 8.4–10.5)
Chloride: 97 mEq/L (ref 96–112)
Creat: 0.55 mg/dL (ref 0.50–1.10)
Glucose, Bld: 85 mg/dL (ref 70–99)
Potassium: 4.8 mEq/L (ref 3.5–5.3)
Sodium: 139 mEq/L (ref 135–145)
Total Bilirubin: 0.6 mg/dL (ref 0.2–1.2)
Total Protein: 7.1 g/dL (ref 6.0–8.3)

## 2014-11-21 LAB — CBC
HCT: 42.1 % (ref 36.0–46.0)
Hemoglobin: 13.9 g/dL (ref 12.0–15.0)
MCH: 28 pg (ref 26.0–34.0)
MCHC: 33 g/dL (ref 30.0–36.0)
MCV: 84.7 fL (ref 78.0–100.0)
MPV: 10.3 fL (ref 8.6–12.4)
Platelets: 360 10*3/uL (ref 150–400)
RBC: 4.97 MIL/uL (ref 3.87–5.11)
RDW: 13.1 % (ref 11.5–15.5)
WBC: 12.9 10*3/uL — ABNORMAL HIGH (ref 4.0–10.5)

## 2014-11-21 MED ORDER — HYDROCHLOROTHIAZIDE 25 MG PO TABS: 25.0000 mg | ORAL_TABLET | Freq: Every day | ORAL | Status: DC

## 2014-11-21 MED ORDER — MELOXICAM 7.5 MG PO TABS: 7.5000 mg | ORAL_TABLET | Freq: Every day | ORAL | Status: DC

## 2014-11-21 MED ORDER — DEXLANSOPRAZOLE 60 MG PO CPDR: 1.0000 | DELAYED_RELEASE_CAPSULE | Freq: Two times a day (BID) | ORAL | Status: DC

## 2014-11-21 MED ORDER — ENALAPRIL MALEATE 20 MG PO TABS: 20.0000 mg | ORAL_TABLET | Freq: Every day | ORAL | Status: DC

## 2014-11-21 NOTE — Progress Notes (Signed)
Subjective: For routine follow up and med check  Diabetes since last visit compliant with medications which include metformin 1000 mg twice daily, bydureon weekly.  Glucose averaging 119-135, checks fasting, no complaints, no urinary problems since stopping Invokana.  Not sure about Bydureon since there was a question about insurance denial  has questions about skin peeling on her feet, been using Lamisil cream for about a month  Seeing Dr. Francis Dowse for chronic low back pain and leg pain, neuropathy, sees him soon and they're discussing putting her on Keppra for leg pain and neuropathy.  They had her on nortriptyline but she slept all day on this   She sees urology next week for referral for urinary problems incontinence and urinary tract infections  Hypertension compliant with medication without complaint  GERD-does fine on Dexilant, does not do well when not taking the medication  Last bone density was normal back in 2012  No other c/o.  Past Medical History  Diagnosis Date  . Hypertension   . Hyperlipidemia   . Glaucoma   . Urinary incontinence   . Chronic back pain   . Osteoarthritis   . Edema   . Female bladder prolapse   . Allergy   . Migraine   . GERD (gastroesophageal reflux disease)   . Diverticulosis   . Chronic diarrhea     Colestipol therapy  . Adrenal mass, right     01/2010, MRI abdomen  . Blood transfusion 1970  . Vitamin D deficiency   . Rheumatic fever     age 54  . Diverticulitis   . Fatty liver   . H/O hiatal hernia   . Blind right eye     since childhood  . Asthma     "as a teenager"  . Type II diabetes mellitus   . Kidney stones   . History of UTI     remote past  . H/O mammogram 07/2012    normal  . H/O bone density study 06/03/2011  . Hypothyroidism   . History of uterine cancer     s/p hysterectomy  . TMJ (dislocation of temporomandibular joint)     Hx: of     Objective: BP 122/80 mmHg  Pulse 76  Temp(Src) 97.9 F (36.6  C) (Oral)  Resp 14  Wt 204 lb (92.534 kg)  BP Readings from Last 3 Encounters:  11/21/14 122/80  10/26/14 98/60  07/20/14 136/84   Wt Readings from Last 3 Encounters:  11/21/14 204 lb (92.534 kg)  10/26/14 210 lb (95.255 kg)  07/19/14 208 lb (94.348 kg)   Gen: wd, wn, nad Heart: RRR, normal S1, S2, no murmurs Lungs clear 1+ pedal pulses Ext: no edema, no cyanosis, no clubbing Neuro: decreased sensation of bilat feet throughout with monofilament Several small skin lesions with erythema and some ulcerated of bilat LE, chronic for her, pink skin between toes, some old skin flaking off, no worrisome wounds, improved from last visit regarding Tinea Neck: supple, no mass, no thyromegaly, no lymphadenopathy   Assessment: Encounter Diagnoses  Name Primary?  . Diabetes type 2, uncontrolled Yes  . Diabetic mononeuropathy associated with diabetes mellitus due to underlying condition   . Essential hypertension   . Tinea pedis of both feet   . Other specified hypothyroidism   . Gastroesophageal reflux disease without esophagitis   . High risk medication use    Plan: Diabetes type 2-labs today, continue current medications which include metformin 1000 mg twice daily, Bydureon once weekly,continue diabetic diet,  exercise when possible, labs today  Diabetic neuropathy-follow-up with Dr. Vertell Limber has planned, will be starting Keppra soon, c/t daily foot checks  HTN - c/t current medication  Tinea - reassured, improving, c/t Lamisil another 2 weeks then try stopping this.  Use daily moisturizing lotion once she has stopped Lamisil  Hypothyroidism - labs today  GERD - discussed risk and benefits of medication, discussed her symptoms, EGD in 2015. Discussed risk of osteoporosis  Screening for osteoporosis-continue vitamin D, exercise healthy diet, and she will let me know when she wants to update her bone density scan

## 2014-11-22 ENCOUNTER — Telehealth: Payer: Self-pay | Admitting: Medical

## 2014-11-22 ENCOUNTER — Telehealth: Payer: Self-pay | Admitting: Family Medicine

## 2014-11-22 LAB — HEMOGLOBIN A1C
Hgb A1c MFr Bld: 6.6 % — ABNORMAL HIGH (ref <5.7)
Mean Plasma Glucose: 143 mg/dL — ABNORMAL HIGH (ref <117)

## 2014-11-22 LAB — MICROALBUMIN / CREATININE URINE RATIO
Creatinine, Urine: 106.2 mg/dL
Microalb Creat Ratio: 5.6 mg/g (ref 0.0–30.0)
Microalb, Ur: 0.6 mg/dL (ref <2.0)

## 2014-11-22 MED ORDER — GLUCOSE BLOOD VI STRP: ORAL_STRIP | Status: DC

## 2014-11-22 NOTE — Telephone Encounter (Signed)
Pt states went to pharmacy again last night for her strips and lancets and they still weren't called in for the second time.  I apologized and I called it into the pharmacy.  Pt states that she tests once a day.

## 2014-11-22 NOTE — Telephone Encounter (Signed)
Scheduled Bone Density Scan Thursday, Feb 11th @ 9:30, be there at 9:00 [place], elastic waist band. Left message for patient to call back and confirm she received this info I left on her voicemail.

## 2014-11-23 LAB — T4, FREE: Free T4: 1.11 ng/dL (ref 0.80–1.80)

## 2014-11-23 LAB — TSH: TSH: 1.109 u[IU]/mL (ref 0.350–4.500)

## 2014-11-23 NOTE — Telephone Encounter (Signed)
P.A. BYDUREON PEN approved til 11/05/15, pt informed, faxed pharmacy

## 2014-11-23 NOTE — Telephone Encounter (Signed)
Patient did receive the message about her appointment from [facility]

## 2014-11-26 DIAGNOSIS — N8111 Cystocele, midline: Secondary | ICD-10-CM | POA: Diagnosis not present

## 2014-11-26 DIAGNOSIS — N3946 Mixed incontinence: Secondary | ICD-10-CM | POA: Diagnosis not present

## 2014-11-26 DIAGNOSIS — N3020 Other chronic cystitis without hematuria: Secondary | ICD-10-CM | POA: Diagnosis not present

## 2014-11-29 ENCOUNTER — Encounter: Payer: Self-pay | Admitting: Medical

## 2014-11-29 DIAGNOSIS — R29890 Loss of height: Secondary | ICD-10-CM | POA: Diagnosis not present

## 2014-11-29 DIAGNOSIS — Z79899 Other long term (current) drug therapy: Secondary | ICD-10-CM | POA: Diagnosis not present

## 2014-11-29 DIAGNOSIS — E780 Pure hypercholesterolemia: Secondary | ICD-10-CM | POA: Diagnosis not present

## 2014-11-29 DIAGNOSIS — Z90710 Acquired absence of both cervix and uterus: Secondary | ICD-10-CM | POA: Diagnosis not present

## 2014-11-29 DIAGNOSIS — Z780 Asymptomatic menopausal state: Secondary | ICD-10-CM | POA: Diagnosis not present

## 2014-11-29 DIAGNOSIS — Z8542 Personal history of malignant neoplasm of other parts of uterus: Secondary | ICD-10-CM | POA: Diagnosis not present

## 2014-11-29 DIAGNOSIS — E119 Type 2 diabetes mellitus without complications: Secondary | ICD-10-CM | POA: Diagnosis not present

## 2014-11-29 DIAGNOSIS — E28319 Asymptomatic premature menopause: Secondary | ICD-10-CM | POA: Diagnosis not present

## 2014-11-29 DIAGNOSIS — Z13820 Encounter for screening for osteoporosis: Secondary | ICD-10-CM | POA: Diagnosis not present

## 2014-11-29 DIAGNOSIS — Z9289 Personal history of other medical treatment: Secondary | ICD-10-CM

## 2014-11-29 DIAGNOSIS — I10 Essential (primary) hypertension: Secondary | ICD-10-CM | POA: Diagnosis not present

## 2014-11-29 DIAGNOSIS — M549 Dorsalgia, unspecified: Secondary | ICD-10-CM | POA: Diagnosis not present

## 2014-11-29 HISTORY — DX: Personal history of other medical treatment: Z92.89

## 2014-11-29 LAB — HM DEXA SCAN: HM Dexa Scan: NORMAL

## 2014-12-07 ENCOUNTER — Telehealth: Payer: Self-pay | Admitting: Medical

## 2014-12-07 NOTE — Telephone Encounter (Signed)
Patient is aware of her Bone density results and Dorothea Ogle PA recommendations

## 2014-12-07 NOTE — Telephone Encounter (Signed)
Bone density scan is normal although a decrease in bone density was seen from prior study.   Recommendations are vit D daily, 4 servings of dairy daily, some type of routine exercise.    Plan to recheck bone density scan in 2-4 years.

## 2014-12-07 NOTE — Telephone Encounter (Signed)
error 

## 2014-12-11 ENCOUNTER — Other Ambulatory Visit: Payer: Self-pay | Admitting: Medical

## 2014-12-13 ENCOUNTER — Encounter: Payer: Self-pay | Admitting: Medical

## 2014-12-17 DIAGNOSIS — N8111 Cystocele, midline: Secondary | ICD-10-CM | POA: Diagnosis not present

## 2014-12-17 DIAGNOSIS — N3946 Mixed incontinence: Secondary | ICD-10-CM | POA: Diagnosis not present

## 2014-12-18 ENCOUNTER — Other Ambulatory Visit: Payer: Self-pay | Admitting: Medical

## 2014-12-24 DIAGNOSIS — N3946 Mixed incontinence: Secondary | ICD-10-CM | POA: Diagnosis not present

## 2014-12-24 DIAGNOSIS — N8111 Cystocele, midline: Secondary | ICD-10-CM | POA: Diagnosis not present

## 2014-12-25 DIAGNOSIS — H4011X3 Primary open-angle glaucoma, severe stage: Secondary | ICD-10-CM | POA: Diagnosis not present

## 2014-12-25 DIAGNOSIS — E119 Type 2 diabetes mellitus without complications: Secondary | ICD-10-CM | POA: Diagnosis not present

## 2014-12-26 ENCOUNTER — Other Ambulatory Visit: Payer: Self-pay | Admitting: Urology

## 2015-01-09 ENCOUNTER — Other Ambulatory Visit: Payer: Self-pay | Admitting: Neurosurgery

## 2015-01-09 DIAGNOSIS — M4806 Spinal stenosis, lumbar region: Secondary | ICD-10-CM | POA: Diagnosis not present

## 2015-01-09 DIAGNOSIS — M545 Low back pain: Secondary | ICD-10-CM

## 2015-01-09 DIAGNOSIS — M5127 Other intervertebral disc displacement, lumbosacral region: Secondary | ICD-10-CM | POA: Diagnosis not present

## 2015-01-09 DIAGNOSIS — M4310 Spondylolisthesis, site unspecified: Secondary | ICD-10-CM | POA: Diagnosis not present

## 2015-01-09 DIAGNOSIS — M5417 Radiculopathy, lumbosacral region: Secondary | ICD-10-CM | POA: Diagnosis not present

## 2015-01-12 ENCOUNTER — Other Ambulatory Visit: Payer: Self-pay | Admitting: Medical

## 2015-01-18 NOTE — Patient Instructions (Addendum)
Robyn Hill  01/18/2015   Your procedure is scheduled on:  01/29/2015    Report to [facility] Main  Entrance and follow signs to               [place] at       [place].  Call this number if you have problems the morning of surgery [phone]   Remember: Eat a good healthy snack prior to bedtime.    Do not eat food or drink liquids :After Midnight.              Fleets enema nite before surgery   Take these medicines the morning of surgery with A SIP OF WATER:  Dexilant, cosopt eye drops, Cymbalta              Do not take any diabetic medications am of surgery.                                 You may not have any metal on your body including hair pins and              piercings  Do not wear jewelry, make-up, lotions, powders or perfumes., deodorant.               Do not wear nail polish.  Do not shave  48 hours prior to surgery.                 Do not bring valuables to the hospital. [place].  Contacts, dentures or bridgework may not be worn into surgery.  Leave suitcase in the car. After surgery it may be brought to your room.       Special Instructions: coughing and deep breathing exercises, leg exercises               Please read over the following fact sheets you were given: _____________________________________________________________________             [facility] - Preparing for Surgery Before surgery, you can play an important role.  Because skin is not sterile, your skin needs to be as free of germs as possible.  You can reduce the number of germs on your skin by washing with CHG (chlorahexidine gluconate) soap before surgery.  CHG is an antiseptic cleaner which kills germs and bonds with the skin to continue killing germs even after washing. Please DO NOT use if you have an allergy to CHG or antibacterial soaps.  If your skin becomes reddened/irritated stop using the CHG and inform  your nurse when you arrive at Short Stay. Do not shave (including legs and underarms) for at least 48 hours prior to the first CHG shower.  You may shave your face/neck. Please follow these instructions carefully:  1.  Shower with CHG Soap the night before surgery and the  morning of Surgery.  2.  If you choose to wash your hair, wash your hair first as usual with your  normal  shampoo.  3.  After you shampoo, rinse your hair and body thoroughly to remove the  shampoo.  4.  Use CHG as you would any other liquid soap.  You can apply chg directly  to the skin and wash                       Gently with a scrungie or clean washcloth.  5.  Apply the CHG Soap to your body ONLY FROM THE NECK DOWN.   Do not use on face/ open                           Wound or open sores. Avoid contact with eyes, ears mouth and genitals (private parts).                       Wash face,  Genitals (private parts) with your normal soap.             6.  Wash thoroughly, paying special attention to the area where your surgery  will be performed.  7.  Thoroughly rinse your body with warm water from the neck down.  8.  DO NOT shower/wash with your normal soap after using and rinsing off  the CHG Soap.                9.  Pat yourself dry with a clean towel.            10.  Wear clean pajamas.            11.  Place clean sheets on your bed the night of your first shower and do not  sleep with pets. Day of Surgery : Do not apply any lotions/deodorants the morning of surgery.  Please wear clean clothes to the hospital/surgery center.  FAILURE TO FOLLOW THESE INSTRUCTIONS MAY RESULT IN THE CANCELLATION OF YOUR SURGERY PATIENT SIGNATURE_________________________________  NURSE SIGNATURE__________________________________  ________________________________________________________________________  WHAT IS A BLOOD TRANSFUSION? Blood Transfusion Information  A transfusion is the replacement of blood or some  of its parts. Blood is made up of multiple cells which provide different functions.  Red blood cells carry oxygen and are used for blood loss replacement.  White blood cells fight against infection.  Platelets control bleeding.  Plasma helps clot blood.  Other blood products are available for specialized needs, such as hemophilia or other clotting disorders. BEFORE THE TRANSFUSION  Who gives blood for transfusions?   Healthy volunteers who are fully evaluated to make sure their blood is safe. This is blood bank blood. Transfusion therapy is the safest it has ever been in the practice of medicine. Before blood is taken from a donor, a complete history is taken to make sure that person has no history of diseases nor engages in risky social behavior (examples are intravenous drug use or sexual activity with multiple partners). The donor's travel history is screened to minimize risk of transmitting infections, such as malaria. The donated blood is tested for signs of infectious diseases, such as HIV and hepatitis. The blood is then tested to be sure it is compatible with you in order to minimize the chance of a transfusion reaction. If you or a relative donates blood, this is often done in anticipation of surgery and is not appropriate for emergency situations. It takes many days to process the donated blood. RISKS AND COMPLICATIONS Although transfusion therapy is very safe and saves many lives, the main dangers of transfusion include:   Getting an infectious disease.  Developing a transfusion reaction. This  is an allergic reaction to something in the blood you were given. Every precaution is taken to prevent this. The decision to have a blood transfusion has been considered carefully by your caregiver before blood is given. Blood is not given unless the benefits outweigh the risks. AFTER THE TRANSFUSION  Right after receiving a blood transfusion, you will usually feel much better and more  energetic. This is especially true if your red blood cells have gotten low (anemic). The transfusion raises the level of the red blood cells which carry oxygen, and this usually causes an energy increase.  The nurse administering the transfusion will monitor you carefully for complications. HOME CARE INSTRUCTIONS  No special instructions are needed after a transfusion. You may find your energy is better. Speak with your caregiver about any limitations on activity for underlying diseases you may have. SEEK MEDICAL CARE IF:   Your condition is not improving after your transfusion.  You develop redness or irritation at the intravenous (IV) site. SEEK IMMEDIATE MEDICAL CARE IF:  Any of the following symptoms occur over the next 12 hours:  Shaking chills.  You have a temperature by mouth above 102 F (38.9 C), not controlled by medicine.  Chest, back, or muscle pain.  People around you feel you are not acting correctly or are confused.  Shortness of breath or difficulty breathing.  Dizziness and fainting.  You get a rash or develop hives.  You have a decrease in urine output.  Your urine turns a dark color or changes to pink, red, or brown. Any of the following symptoms occur over the next 10 days:  You have a temperature by mouth above 102 F (38.9 C), not controlled by medicine.  Shortness of breath.  Weakness after normal activity.  The white part of the eye turns yellow (jaundice).  You have a decrease in the amount of urine or are urinating less often.  Your urine turns a dark color or changes to pink, red, or brown. Document Released: 10/02/2000 Document Revised: 12/28/2011 Document Reviewed: 05/21/2008 [facility] Patient Information 2014 [place], [place].  _______________________________________________________________________

## 2015-01-22 ENCOUNTER — Encounter (HOSPITAL_COMMUNITY)
Admission: RE | Admit: 2015-01-22 | Discharge: 2015-01-22 | Disposition: A | Discharge status: Home / Self Care | Payer: Medicare Other | Source: Ambulatory Visit | Attending: Urology | Admitting: Urology

## 2015-01-22 ENCOUNTER — Encounter (HOSPITAL_COMMUNITY): Payer: Self-pay

## 2015-01-22 DIAGNOSIS — Z01812 Encounter for preprocedural laboratory examination: Secondary | ICD-10-CM | POA: Diagnosis not present

## 2015-01-22 HISTORY — DX: Myoneural disorder, unspecified: G70.9

## 2015-01-22 LAB — CBC
HCT: 41 % (ref 36.0–46.0)
Hemoglobin: 13.1 g/dL (ref 12.0–15.0)
MCH: 27.6 pg (ref 26.0–34.0)
MCHC: 32 g/dL (ref 30.0–36.0)
MCV: 86.3 fL (ref 78.0–100.0)
Platelets: 324 10*3/uL (ref 150–400)
RBC: 4.75 MIL/uL (ref 3.87–5.11)
RDW: 12.7 % (ref 11.5–15.5)
WBC: 10.8 10*3/uL — ABNORMAL HIGH (ref 4.0–10.5)

## 2015-01-22 LAB — BASIC METABOLIC PANEL
Anion gap: 12 (ref 5–15)
BUN: 21 mg/dL (ref 6–23)
CO2: 26 mmol/L (ref 19–32)
Calcium: 9.3 mg/dL (ref 8.4–10.5)
Chloride: 98 mmol/L (ref 96–112)
Creatinine, Ser: 0.61 mg/dL (ref 0.50–1.10)
GFR calc Af Amer: >90 mL/min (ref >90)
GFR calc non Af Amer: >90 mL/min (ref >90)
Glucose, Bld: 109 mg/dL — ABNORMAL HIGH (ref 70–99)
Potassium: 4 mmol/L (ref 3.5–5.1)
Sodium: 136 mmol/L (ref 135–145)

## 2015-01-22 LAB — ABO/RH: ABO/RH(D): B POS

## 2015-01-22 LAB — PROTIME-INR
INR: 0.93 (ref 0.00–1.49)
Prothrombin Time: 12.6 seconds (ref 11.6–15.2)

## 2015-01-22 LAB — APTT: aPTT: 27 seconds (ref 24–37)

## 2015-01-22 NOTE — Progress Notes (Addendum)
EKG- 06/27/2014 EPIC  LOV with PCP - Audelia Acton Tysinger- 11/21/14 in [facility]

## 2015-01-28 MED ORDER — GENTAMICIN SULFATE 40 MG/ML IJ SOLN
360.0000 mg | INTRAVENOUS | Status: AC
  Administered 2015-01-29: 360 mg via INTRAVENOUS
  Filled 2015-01-28: qty 9

## 2015-01-28 NOTE — H&P (Signed)
History of Present Illness   I was consulted by Dr Glade Lloyd regarding Robyn Hill' worsening prolapse symptoms. She can feel and try to reduce prolapse, but does not do any splinting. She has normal bowel movements. She has not been medically treated for her symptoms. She is on oral hypoglycemics and has lower back surgery.   She has mixed stress urge incontinence but normally does not wear a pad. She does not leak with stress and urge on every occasion. She denies enuresis. When she had a recent urinary tract infection, she was wearing 2 pads a day for urge incontinence.    When I saw Robyn Hill last time, she had mild frequency and nocturia. She has had previous ureteroscopy. She has infrequent urinary tract infections. She had a grade 3 cystocele that reached the introitus with a central defect. Vaginal cuff descended 2-3 cm. She had a mild grade 2 rectocele and a negative cough test. She has had a hysterectomy. I thought if she ever had surgery, she likely benefit from a cystocele repair and graft and likely a vault suspension and possibly a rectocele repair. She had a little bit of trapdoor deformity, as noted.   She was here to discuss her urodynamics.   On uroflowmetry, she voided 19 mL with a maximum flow of 3 mL/sec and she was catheterized for 40 mL. Maximum capacity was 350 mL. Bladder was unstable reaching a pressure of 10 cmH2O. Instability was triggered by coughing. She leaked a mild amount. At 200 mL, she leaked a mild amount with Valsalva leak-point pressure of 74 cmH2O with prolapse reduced. She had no leakage prior to reducing her prolapse. During voluntary voiding, she voided 327 mL with a maximum flow of 20 mL/sec. Maximum voiding pressure was approximately 10 cmH2O. EMG activity increased during the voiding phase. Her bladder neck distended approximately 2-3 cm. Fluoroscopically had a cystocele and spinal hardware _____. She did have _____ urgency. The details of the urodynamics are  signed and dictated on the urodynamic sheet.    Past Medical History Problems  1. History of High cholesterol (E78.0) 2. History of arthritis (Z87.39) 3. History of cancer of uterus (Z85.42) 4. History of diabetes mellitus (Z86.39) 5. History of esophageal reflux (Z87.19) 6. History of glaucoma (Z86.69) 7. History of hypertension (Z86.79)  Surgical History Problems  1. History of Back Surgery 2. History of Hysterectomy  Current Meds 1. Bydureon 2 MG Subcutaneous Pen-injector;  Therapy: (Recorded:08Feb2016) to Recorded 2. Cephalexin 500 MG Oral Capsule; TAKE 1 CAPSULE 3 times daily;  Therapy: [id number] to (Evaluate:15Feb2016)  Requested for: [id number]; Last  Rx:10Feb2016 Ordered 3. Cymbalta 60 MG Oral Capsule Delayed Release Particles;  Therapy: (Recorded:08Feb2016) to Recorded 4. Dexilant 60 MG Oral Capsule Delayed Release;  Therapy: (Recorded:08Feb2016) to Recorded 5. Dorzolamide HCl-Timolol Mal SOLN;  Therapy: (Recorded:08Feb2016) to Recorded 6. Enalapril Maleate 20 MG Oral Tablet;  Therapy: (Recorded:08Feb2016) to Recorded 7. Ferrous Gluconate 325 MG TABS;  Therapy: (Recorded:08Feb2016) to Recorded 8. Hydrochlorothiazide 25 MG Oral Tablet;  Therapy: (Recorded:08Feb2016) to Recorded 9. Hydrocodone-Acetaminophen 5-325 MG Oral Tablet;  Therapy: (Recorded:08Feb2016) to Recorded 10. Keppra 250 MG Oral Tablet;   Therapy: (Recorded:08Feb2016) to Recorded 11. Lipitor 40 MG Oral Tablet;   Therapy: (Recorded:08Feb2016) to Recorded 12. Meloxicam 7.5 MG Oral Tablet;   Therapy: (Recorded:08Feb2016) to Recorded 13. MetFORMIN HCl - 1000 MG Oral Tablet;   Therapy: (Recorded:08Feb2016) to Recorded 14. Mupirocin OINT;   Therapy: (Recorded:08Feb2016) to Recorded 15. Triamcinolone Acetonide 0.1 % External Cream;   Therapy: (Recorded:08Feb2016) to  Recorded 16. Vitamin D3 1000 UNIT Oral Tablet;   Therapy: (Recorded:08Feb2016) to Recorded 17. Xalatan 0.005 % Ophthalmic Solution;    Therapy: (Recorded:08Feb2016) to Recorded  Allergies Medication  1. hydrocodone Non-Medication  2. Sunflower Products  Family History Problems  1. Family history of aplastic anemia (Z83.2) : Mother 2. Family history of cardiac disorder (Z82.49) : Father, Sibling 3. Family history of tuberculosis (Z63.1) : Mother  Social History Problems  1. Alcohol use (Z78.9)   very rarely 2. Death in the family, father   age 16 due to heart attack 3. Death in the family, mother   age 15 due to aplastic anemia 4. Four children   3 sons and 1 daughter 5. Never a smoker 6. Occasional caffeine consumption 7. Retired   Clinical research associate Assessed  1. Cystocele, midline (N81.11) 2. Urge and stress incontinence (N39.46)  Plan Urge and stress incontinence  1. Follow-up Schedule Surgery Office  Follow-up  Status: Hold For - Appointment   Requested for: [id number]  Discussion/Summary   I drew Robyn Hill a picture and we talked about watchful waiting versus pessary versus a transvaginal vault suspension, cystocele repair and graft, and possible rectocele repair.   I drew her a picture and we talked about prolapse surgery in detail. Pros, cons, general surgical and anesthetic risks, and other options including behavioral therapy, pessaries, and watchful waiting were discussed. She understands that prolapse repairs are successful in 80-85% of cases for prolapse symptoms and can recur anteriorly, posteriorly, and/or apically. She understands that in most cases I use a graft and general risks were discussed. Surgical risks were described but not limited to the discussion of injury to neighboring structures including the bowel (with possible life-threatening sepsis and colostomy), bladder, urethra, vagina (all resulting in further surgery), and ureter (resulting in re-implantation). We talked about injury to nerves/soft tissue leading to debilitating and intractable pelvic, abdominal, and lower  extremity pain syndromes and neuropathies. The risks of buttock pain, intractable dyspareunia, and vaginal narrowing and shortening with sequelae were discussed. Bleeding risks, transfusion rates, and infection were discussed. The risk of persistent, de novo, or worsening bladder and/or bowel incontinence/dysfunction was discussed. The need for CIC was described as well the usual post-operative course. The patient understands that she might not reach her treatment goal and that she might be worse following surgery.  We discussed watchful waiting versus medical and behavioral therapy versus a sling for mild mixed incontinence.   We talked about a sling in detail. Pros, cons, general surgical and anesthetic risks, and other options including behavioral therapy and watchful waiting were discussed. She understands that slings are generally successful in 90% of cases for stress incontinence, 50% for urge incontinence, and that in a small percentage of cases the incontinence can worsen. The risk of persistent, de novo, or worsening incontinence/dysfunction was discussed. Risks were described but not limited to the discussion of injury to neighboring structures including the bowel (with possible life-threatening sepsis and colostomy), bladder, urethra, vagina (all resulting in further surgery), and ureter (resulting in re-implantation). We also talked about the risk of retention requiring urethrolysis, extrusion requiring revision, and erosion resulting in further surgery. Bleeding risks and transfusion rates and the risk of infection were discussed. The risk of pelvic and abdominal pain syndromes, dyspareunia, and neuropathies were discussed. The need for CIC was described as well as the usual postoperative course. The patient understands that she might not reach her treatment goal and that she might be worse following surgery. Mesh  TV issues were discussed.  Mesh issues discussed on TV.   Robyn Hill would like  her prolapse surgery. She elected not to have a sling and I think it is a good choice for her. Role of delayed sling was also discussed and future medical and behavioral therapy if she does unmask worsening incontinence.   She has had 2 lower back operations and describes past history of right but now left milder sciatica. Leg positioning and aggravating pain and neuropathic symptoms were discussed.  After a thorough review of the management options for the patient's condition the patient  elected to proceed with surgical therapy as noted above. We have discussed the potential benefits and risks of the procedure, side effects of the proposed treatment, the likelihood of the patient achieving the goals of the procedure, and any potential problems that might occur during the procedure or recuperation. Informed consent has been obtained.

## 2015-01-29 ENCOUNTER — Ambulatory Visit (HOSPITAL_COMMUNITY)
Admission: RE | Admit: 2015-01-29 | Discharge: 2015-01-30 | Disposition: A | Discharge status: Home / Self Care | Payer: Medicare Other | Source: Ambulatory Visit | Attending: Urology | Admitting: Urology

## 2015-01-29 ENCOUNTER — Ambulatory Visit (HOSPITAL_COMMUNITY): Payer: Medicare Other | Admitting: Anesthesiology

## 2015-01-29 ENCOUNTER — Encounter (HOSPITAL_COMMUNITY): Payer: Self-pay | Admitting: *Deleted

## 2015-01-29 ENCOUNTER — Encounter (HOSPITAL_COMMUNITY)
Admission: RE | Disposition: A | Discharge status: Home / Self Care | Payer: Self-pay | Source: Ambulatory Visit | Attending: Urology

## 2015-01-29 DIAGNOSIS — N816 Rectocele: Secondary | ICD-10-CM | POA: Insufficient documentation

## 2015-01-29 DIAGNOSIS — E780 Pure hypercholesterolemia: Secondary | ICD-10-CM | POA: Insufficient documentation

## 2015-01-29 DIAGNOSIS — E039 Hypothyroidism, unspecified: Secondary | ICD-10-CM | POA: Insufficient documentation

## 2015-01-29 DIAGNOSIS — J45909 Unspecified asthma, uncomplicated: Secondary | ICD-10-CM | POA: Diagnosis not present

## 2015-01-29 DIAGNOSIS — E279 Disorder of adrenal gland, unspecified: Secondary | ICD-10-CM | POA: Diagnosis not present

## 2015-01-29 DIAGNOSIS — H409 Unspecified glaucoma: Secondary | ICD-10-CM | POA: Insufficient documentation

## 2015-01-29 DIAGNOSIS — Z8542 Personal history of malignant neoplasm of other parts of uterus: Secondary | ICD-10-CM | POA: Diagnosis not present

## 2015-01-29 DIAGNOSIS — M549 Dorsalgia, unspecified: Secondary | ICD-10-CM | POA: Diagnosis not present

## 2015-01-29 DIAGNOSIS — G8929 Other chronic pain: Secondary | ICD-10-CM | POA: Insufficient documentation

## 2015-01-29 DIAGNOSIS — M2660 Temporomandibular joint disorder, unspecified: Secondary | ICD-10-CM | POA: Insufficient documentation

## 2015-01-29 DIAGNOSIS — Z79899 Other long term (current) drug therapy: Secondary | ICD-10-CM | POA: Insufficient documentation

## 2015-01-29 DIAGNOSIS — E1165 Type 2 diabetes mellitus with hyperglycemia: Secondary | ICD-10-CM | POA: Diagnosis not present

## 2015-01-29 DIAGNOSIS — I10 Essential (primary) hypertension: Secondary | ICD-10-CM | POA: Insufficient documentation

## 2015-01-29 DIAGNOSIS — M1990 Unspecified osteoarthritis, unspecified site: Secondary | ICD-10-CM | POA: Insufficient documentation

## 2015-01-29 DIAGNOSIS — R51 Headache: Secondary | ICD-10-CM | POA: Insufficient documentation

## 2015-01-29 DIAGNOSIS — N3946 Mixed incontinence: Secondary | ICD-10-CM | POA: Diagnosis not present

## 2015-01-29 DIAGNOSIS — N8110 Cystocele, unspecified: Secondary | ICD-10-CM | POA: Diagnosis not present

## 2015-01-29 DIAGNOSIS — K449 Diaphragmatic hernia without obstruction or gangrene: Secondary | ICD-10-CM | POA: Insufficient documentation

## 2015-01-29 DIAGNOSIS — Z79891 Long term (current) use of opiate analgesic: Secondary | ICD-10-CM | POA: Insufficient documentation

## 2015-01-29 DIAGNOSIS — K219 Gastro-esophageal reflux disease without esophagitis: Secondary | ICD-10-CM | POA: Diagnosis not present

## 2015-01-29 DIAGNOSIS — N8111 Cystocele, midline: Secondary | ICD-10-CM | POA: Diagnosis not present

## 2015-01-29 HISTORY — PX: CYSTOSCOPY: SHX5120

## 2015-01-29 HISTORY — PX: ANTERIOR AND POSTERIOR REPAIR: SHX5121

## 2015-01-29 HISTORY — PX: VAGINAL PROLAPSE REPAIR: SHX830

## 2015-01-29 LAB — TYPE AND SCREEN
ABO/RH(D): B POS
Antibody Screen: NEGATIVE

## 2015-01-29 LAB — GLUCOSE, CAPILLARY
Glucose-Capillary: 152 mg/dL — ABNORMAL HIGH (ref 70–99)
Glucose-Capillary: 142 mg/dL — ABNORMAL HIGH (ref 70–99)
Glucose-Capillary: 205 mg/dL — ABNORMAL HIGH (ref 70–99)

## 2015-01-29 SURGERY — CYSTOSCOPY
Anesthesia: General

## 2015-01-29 MED ORDER — LACTATED RINGERS IV SOLN
INTRAVENOUS | Status: DC
  Administered 2015-01-29 – 2015-01-30 (×3): via INTRAVENOUS

## 2015-01-29 MED ORDER — ENALAPRIL MALEATE 20 MG PO TABS
20.0000 mg | ORAL_TABLET | Freq: Every day | ORAL | Status: DC
  Administered 2015-01-29 – 2015-01-30 (×2): 20 mg via ORAL
  Filled 2015-01-29 (×2): qty 1

## 2015-01-29 MED ORDER — PROPOFOL 10 MG/ML IV BOLUS
INTRAVENOUS | Status: DC | PRN
  Administered 2015-01-29: 150 mg via INTRAVENOUS

## 2015-01-29 MED ORDER — HYDROCHLOROTHIAZIDE 25 MG PO TABS
25.0000 mg | ORAL_TABLET | Freq: Every day | ORAL | Status: DC
  Administered 2015-01-29 – 2015-01-30 (×2): 25 mg via ORAL
  Filled 2015-01-29 (×2): qty 1

## 2015-01-29 MED ORDER — DULOXETINE HCL 60 MG PO CPEP
60.0000 mg | ORAL_CAPSULE | Freq: Two times a day (BID) | ORAL | Status: DC
  Administered 2015-01-29 – 2015-01-30 (×2): 60 mg via ORAL
  Filled 2015-01-29 (×2): qty 1

## 2015-01-29 MED ORDER — STERILE WATER FOR IRRIGATION IR SOLN
Status: DC | PRN
  Administered 2015-01-29: 3000 mL

## 2015-01-29 MED ORDER — DORZOLAMIDE HCL-TIMOLOL MAL 2-0.5 % OP SOLN
1.0000 [drp] | Freq: Two times a day (BID) | OPHTHALMIC | Status: DC
  Administered 2015-01-29 – 2015-01-30 (×3): 1 [drp] via OPHTHALMIC
  Filled 2015-01-29: qty 10

## 2015-01-29 MED ORDER — LACTATED RINGERS IV SOLN
INTRAVENOUS | Status: DC | PRN
  Administered 2015-01-29 (×2): via INTRAVENOUS

## 2015-01-29 MED ORDER — FLEET ENEMA 7-19 GM/118ML RE ENEM: 1.0000 | ENEMA | Freq: Once | RECTAL | Status: DC

## 2015-01-29 MED ORDER — SODIUM CHLORIDE 0.9 % IV SOLN
1.5000 g | Freq: Four times a day (QID) | INTRAVENOUS | Status: AC
  Administered 2015-01-29 – 2015-01-30 (×3): 1.5 g via INTRAVENOUS
  Filled 2015-01-29 (×3): qty 1.5

## 2015-01-29 MED ORDER — LIDOCAINE HCL (CARDIAC) 20 MG/ML IV SOLN
INTRAVENOUS | Status: AC
  Filled 2015-01-29: qty 5

## 2015-01-29 MED ORDER — SODIUM CHLORIDE 0.9 % IJ SOLN: 3.0000 mL | INTRAMUSCULAR | Status: DC | PRN

## 2015-01-29 MED ORDER — HYDROMORPHONE HCL 2 MG/ML IJ SOLN
INTRAMUSCULAR | Status: AC
  Filled 2015-01-29: qty 1

## 2015-01-29 MED ORDER — NON FORMULARY: 60.0000 mg | Freq: Two times a day (BID) | Status: DC

## 2015-01-29 MED ORDER — GLYCOPYRROLATE 0.2 MG/ML IJ SOLN
INTRAMUSCULAR | Status: AC
  Filled 2015-01-29: qty 2

## 2015-01-29 MED ORDER — ONDANSETRON HCL 4 MG/2ML IJ SOLN
INTRAMUSCULAR | Status: DC | PRN
  Administered 2015-01-29: 4 mg via INTRAVENOUS

## 2015-01-29 MED ORDER — SODIUM CHLORIDE 0.9 % IJ SOLN
INTRAMUSCULAR | Status: AC
Start: 2015-01-29 — End: 2015-01-29
  Filled 2015-01-29: qty 10

## 2015-01-29 MED ORDER — SODIUM CHLORIDE 0.9 % IR SOLN
Status: AC
  Filled 2015-01-29: qty 1

## 2015-01-29 MED ORDER — ROCURONIUM BROMIDE 100 MG/10ML IV SOLN
INTRAVENOUS | Status: AC
  Filled 2015-01-29: qty 1

## 2015-01-29 MED ORDER — FENTANYL CITRATE 0.05 MG/ML IJ SOLN
INTRAMUSCULAR | Status: AC
  Filled 2015-01-29: qty 2

## 2015-01-29 MED ORDER — ONDANSETRON HCL 4 MG/2ML IJ SOLN: 4.0000 mg | INTRAMUSCULAR | Status: DC | PRN

## 2015-01-29 MED ORDER — SODIUM CHLORIDE 0.9 % IR SOLN
Status: DC | PRN
  Administered 2015-01-29: 500 mL

## 2015-01-29 MED ORDER — LIDOCAINE-EPINEPHRINE 1 %-1:100000 IJ SOLN
INTRAMUSCULAR | Status: AC
Start: 2015-01-29 — End: 2015-01-29
  Filled 2015-01-29: qty 1

## 2015-01-29 MED ORDER — HYDROCODONE-ACETAMINOPHEN 10-325 MG PO TABS
1.0000 | ORAL_TABLET | ORAL | Status: DC | PRN
  Administered 2015-01-29 – 2015-01-30 (×4): 1 via ORAL
  Filled 2015-01-29 (×4): qty 1

## 2015-01-29 MED ORDER — HYDROMORPHONE HCL 1 MG/ML IJ SOLN
0.5000 mg | INTRAMUSCULAR | Status: DC | PRN
  Administered 2015-01-29: 1 mg via INTRAVENOUS
  Filled 2015-01-29: qty 1

## 2015-01-29 MED ORDER — SODIUM CHLORIDE 0.9 % IV SOLN
INTRAVENOUS | Status: AC
  Filled 2015-01-29: qty 1.5

## 2015-01-29 MED ORDER — MIDAZOLAM HCL 2 MG/2ML IJ SOLN
INTRAMUSCULAR | Status: AC
  Filled 2015-01-29: qty 2

## 2015-01-29 MED ORDER — LIDOCAINE HCL (CARDIAC) 20 MG/ML IV SOLN
INTRAVENOUS | Status: DC | PRN
  Administered 2015-01-29: 40 mg via INTRAVENOUS

## 2015-01-29 MED ORDER — SODIUM CHLORIDE 0.9 % IV SOLN
1.5000 g | INTRAVENOUS | Status: AC
  Administered 2015-01-29: 1.5 g via INTRAVENOUS

## 2015-01-29 MED ORDER — VITAMIN D3 25 MCG (1000 UNIT) PO TABS
1000.0000 [IU] | ORAL_TABLET | Freq: Every morning | ORAL | Status: DC
  Administered 2015-01-29 – 2015-01-30 (×2): 1000 [IU] via ORAL
  Filled 2015-01-29 (×2): qty 1

## 2015-01-29 MED ORDER — OXYBUTYNIN CHLORIDE 5 MG PO TABS: 5.0000 mg | ORAL_TABLET | Freq: Three times a day (TID) | ORAL | Status: DC | PRN

## 2015-01-29 MED ORDER — ATORVASTATIN CALCIUM 40 MG PO TABS
40.0000 mg | ORAL_TABLET | Freq: Every day | ORAL | Status: DC
  Administered 2015-01-29: 40 mg via ORAL
  Filled 2015-01-29: qty 1

## 2015-01-29 MED ORDER — LATANOPROST 0.005 % OP SOLN
1.0000 [drp] | Freq: Every day | OPHTHALMIC | Status: DC
  Administered 2015-01-29: 1 [drp] via OPHTHALMIC
  Filled 2015-01-29: qty 2.5

## 2015-01-29 MED ORDER — ESTRADIOL 0.1 MG/GM VA CREA
TOPICAL_CREAM | VAGINAL | Status: AC
  Filled 2015-01-29: qty 85

## 2015-01-29 MED ORDER — DEXAMETHASONE SODIUM PHOSPHATE 10 MG/ML IJ SOLN
INTRAMUSCULAR | Status: AC
  Filled 2015-01-29: qty 1

## 2015-01-29 MED ORDER — MIDAZOLAM HCL 5 MG/5ML IJ SOLN
INTRAMUSCULAR | Status: DC | PRN
  Administered 2015-01-29: 2 mg via INTRAVENOUS

## 2015-01-29 MED ORDER — LACTATED RINGERS IV SOLN: INTRAVENOUS | Status: DC

## 2015-01-29 MED ORDER — METFORMIN HCL 500 MG PO TABS
1000.0000 mg | ORAL_TABLET | Freq: Two times a day (BID) | ORAL | Status: DC
  Administered 2015-01-29 – 2015-01-30 (×2): 1000 mg via ORAL
  Filled 2015-01-29 (×3): qty 2

## 2015-01-29 MED ORDER — FERROUS GLUCONATE 324 (38 FE) MG PO TABS
324.0000 mg | ORAL_TABLET | Freq: Every day | ORAL | Status: DC
  Administered 2015-01-30: 324 mg via ORAL
  Filled 2015-01-29: qty 1

## 2015-01-29 MED ORDER — FENTANYL CITRATE 0.05 MG/ML IJ SOLN
25.0000 ug | INTRAMUSCULAR | Status: DC | PRN
  Administered 2015-01-29 (×2): 50 ug via INTRAVENOUS

## 2015-01-29 MED ORDER — FENTANYL CITRATE 0.05 MG/ML IJ SOLN
INTRAMUSCULAR | Status: DC | PRN
  Administered 2015-01-29 (×5): 50 ug via INTRAVENOUS

## 2015-01-29 MED ORDER — NEOSTIGMINE METHYLSULFATE 10 MG/10ML IV SOLN
INTRAVENOUS | Status: AC
  Filled 2015-01-29: qty 1

## 2015-01-29 MED ORDER — FENTANYL CITRATE 0.05 MG/ML IJ SOLN
INTRAMUSCULAR | Status: AC
  Filled 2015-01-29: qty 5

## 2015-01-29 MED ORDER — DEXAMETHASONE SODIUM PHOSPHATE 10 MG/ML IJ SOLN
INTRAMUSCULAR | Status: DC | PRN
  Administered 2015-01-29: 10 mg via INTRAVENOUS

## 2015-01-29 MED ORDER — HYDROMORPHONE HCL 1 MG/ML IJ SOLN
INTRAMUSCULAR | Status: DC | PRN
  Administered 2015-01-29 (×2): 0.5 mg via INTRAVENOUS
  Administered 2015-01-29: 1 mg via INTRAVENOUS

## 2015-01-29 MED ORDER — GLYCOPYRROLATE 0.2 MG/ML IJ SOLN
INTRAMUSCULAR | Status: DC | PRN
  Administered 2015-01-29: 0.4 mg via INTRAVENOUS

## 2015-01-29 MED ORDER — NEOSTIGMINE METHYLSULFATE 10 MG/10ML IV SOLN
INTRAVENOUS | Status: DC | PRN
  Administered 2015-01-29: 3 mg via INTRAVENOUS

## 2015-01-29 MED ORDER — SENNOSIDES-DOCUSATE SODIUM 8.6-50 MG PO TABS
2.0000 | ORAL_TABLET | Freq: Every day | ORAL | Status: DC
  Administered 2015-01-29: 2 via ORAL
  Filled 2015-01-29: qty 2

## 2015-01-29 MED ORDER — ESTRADIOL 0.1 MG/GM VA CREA
TOPICAL_CREAM | VAGINAL | Status: DC | PRN
  Administered 2015-01-29: 1 via VAGINAL

## 2015-01-29 MED ORDER — LIDOCAINE-EPINEPHRINE (PF) 1 %-1:200000 IJ SOLN
INTRAMUSCULAR | Status: DC | PRN
  Administered 2015-01-29: 30 mL

## 2015-01-29 MED ORDER — ROCURONIUM BROMIDE 100 MG/10ML IV SOLN
INTRAVENOUS | Status: DC | PRN
  Administered 2015-01-29: 50 mg via INTRAVENOUS
  Administered 2015-01-29 (×3): 10 mg via INTRAVENOUS

## 2015-01-29 MED ORDER — EPHEDRINE SULFATE 50 MG/ML IJ SOLN
INTRAMUSCULAR | Status: AC
  Filled 2015-01-29: qty 1

## 2015-01-29 MED ORDER — SODIUM CHLORIDE 0.9 % IV SOLN: 250.0000 mL | INTRAVENOUS | Status: DC | PRN

## 2015-01-29 MED ORDER — PHENAZOPYRIDINE HCL 200 MG PO TABS
200.0000 mg | ORAL_TABLET | Freq: Once | ORAL | Status: AC
  Administered 2015-01-29: 200 mg via ORAL
  Filled 2015-01-29: qty 1

## 2015-01-29 MED ORDER — MUPIROCIN 2 % EX OINT
1.0000 "application " | TOPICAL_OINTMENT | Freq: Two times a day (BID) | CUTANEOUS | Status: DC
  Filled 2015-01-29: qty 22

## 2015-01-29 MED ORDER — DEXLANSOPRAZOLE 60 MG PO CPDR
60.0000 mg | DELAYED_RELEASE_CAPSULE | Freq: Two times a day (BID) | ORAL | Status: DC
  Administered 2015-01-29 – 2015-01-30 (×2): 60 mg via ORAL
  Filled 2015-01-29 (×2): qty 1

## 2015-01-29 MED ORDER — ONDANSETRON HCL 4 MG/2ML IJ SOLN
INTRAMUSCULAR | Status: AC
  Filled 2015-01-29: qty 2

## 2015-01-29 MED ORDER — PROPOFOL 10 MG/ML IV BOLUS
INTRAVENOUS | Status: AC
  Filled 2015-01-29: qty 20

## 2015-01-29 MED ORDER — SODIUM CHLORIDE 0.9 % IJ SOLN: 3.0000 mL | Freq: Two times a day (BID) | INTRAMUSCULAR | Status: DC

## 2015-01-29 SURGICAL SUPPLY — 56 items
ALLOGRAFT TUTOPLAST AXIS 6X12 (Tissue) ×1 IMPLANT
BAG URINE DRAINAGE (UROLOGICAL SUPPLIES) ×3 IMPLANT
BLADE HEX COATED 2.75 (ELECTRODE) ×3 IMPLANT
BLADE SURG 15 STRL LF DISP TIS (BLADE) ×2 IMPLANT
BLADE SURG 15 STRL SS (BLADE) ×4
CATH FOLEY 2WAY SLVR  5CC 14FR (CATHETERS) ×2
CATH FOLEY 2WAY SLVR  5CC 16FR (CATHETERS) ×2
CATH FOLEY 2WAY SLVR 5CC 14FR (CATHETERS) ×1 IMPLANT
CATH FOLEY 2WAY SLVR 5CC 16FR (CATHETERS) ×1 IMPLANT
COVER MAYO STAND STRL (DRAPES) ×6 IMPLANT
COVER SURGICAL LIGHT HANDLE (MISCELLANEOUS) ×3 IMPLANT
DECANTER SPIKE VIAL GLASS SM (MISCELLANEOUS) ×3 IMPLANT
DEVICE CAPIO SLIM SINGLE (INSTRUMENTS) ×6 IMPLANT
DRAIN PENROSE 18X1/4 LTX STRL (WOUND CARE) ×3 IMPLANT
ELECT REM PT RETURN 9FT ADLT (ELECTROSURGICAL) ×3
ELECTRODE REM PT RTRN 9FT ADLT (ELECTROSURGICAL) ×1 IMPLANT
GAUZE PACKING 2X5 YD STRL (GAUZE/BANDAGES/DRESSINGS) ×3 IMPLANT
GAUZE SPONGE 4X4 16PLY XRAY LF (GAUZE/BANDAGES/DRESSINGS) ×6 IMPLANT
GLOVE BIO SURGEON STRL SZ 6.5 (GLOVE) ×2 IMPLANT
GLOVE BIO SURGEONS STRL SZ 6.5 (GLOVE) ×1
GLOVE BIOGEL M STRL SZ7.5 (GLOVE) ×3 IMPLANT
GLOVE ECLIPSE 8.5 STRL (GLOVE) ×12 IMPLANT
GOWN STRL REUS W/TWL LRG LVL3 (GOWN DISPOSABLE) ×12 IMPLANT
GOWN STRL REUS W/TWL XL LVL3 (GOWN DISPOSABLE) ×3 IMPLANT
HOLDER FOLEY CATH W/STRAP (MISCELLANEOUS) ×3 IMPLANT
IV NS 1000ML (IV SOLUTION) ×2
IV NS 1000ML BAXH (IV SOLUTION) ×1 IMPLANT
KIT BASIN OR (CUSTOM PROCEDURE TRAY) ×3 IMPLANT
NEEDLE HYPO 22GX1.5 SAFETY (NEEDLE) ×3 IMPLANT
NEEDLE MAYO 6 CRC TAPER PT (NEEDLE) ×3 IMPLANT
NS IRRIG 1000ML POUR BTL (IV SOLUTION) ×3 IMPLANT
PACK CYSTO (CUSTOM PROCEDURE TRAY) ×3 IMPLANT
PENCIL BUTTON HOLSTER BLD 10FT (ELECTRODE) ×3 IMPLANT
PLUG CATH AND CAP STER (CATHETERS) ×3 IMPLANT
POSITIONER SURGICAL ARM (MISCELLANEOUS) ×3 IMPLANT
RETRACTOR STAY HOOK 5MM (MISCELLANEOUS) ×3 IMPLANT
SHEET LAVH (DRAPES) ×3 IMPLANT
SUT CAPIO ETHIBPND (SUTURE) ×9 IMPLANT
SUT VIC AB 0 CT1 27 (SUTURE) ×4
SUT VIC AB 0 CT1 27XBRD ANTBC (SUTURE) ×2 IMPLANT
SUT VIC AB 2-0 CT1 27 (SUTURE) ×4
SUT VIC AB 2-0 CT1 27XBRD (SUTURE) ×2 IMPLANT
SUT VIC AB 2-0 SH 27 (SUTURE) ×12
SUT VIC AB 2-0 SH 27X BRD (SUTURE) ×6 IMPLANT
SUT VIC AB 3-0 SH 27 (SUTURE) ×12
SUT VIC AB 3-0 SH 27XBRD (SUTURE) ×6 IMPLANT
SUT VICRYL 0 UR6 27IN ABS (SUTURE) ×15 IMPLANT
SYRINGE 10CC LL (SYRINGE) ×3 IMPLANT
TOWEL OR 17X26 10 PK STRL BLUE (TOWEL DISPOSABLE) ×3 IMPLANT
TOWEL OR NON WOVEN STRL DISP B (DISPOSABLE) ×3 IMPLANT
TUBING CONNECTING 10 (TUBING) ×4 IMPLANT
TUBING CONNECTING 10' (TUBING) ×2
TUTOPLAST AXIS 6X12 (Tissue) ×3 IMPLANT
WATER STERILE IRR 1500ML POUR (IV SOLUTION) ×3 IMPLANT
WATER STERILE IRR 500ML POUR (IV SOLUTION) ×3 IMPLANT
YANKAUER SUCT BULB TIP 10FT TU (MISCELLANEOUS) ×3 IMPLANT

## 2015-01-29 NOTE — Anesthesia Procedure Notes (Signed)
Procedure Name: Intubation Date/Time: 01/29/2015 7:52 AM Performed by: Glory Buff Pre-anesthesia Checklist: Patient identified, Emergency Drugs available, Suction available and Patient being monitored Patient Re-evaluated:Patient Re-evaluated prior to inductionOxygen Delivery Method: Circle System Utilized Preoxygenation: Pre-oxygenation with 100% oxygen Intubation Type: IV induction Ventilation: Mask ventilation without difficulty Laryngoscope Size: Miller and 3 Grade View: Grade I Tube type: Oral Tube size: 7.5 mm Number of attempts: 1 Airway Equipment and Method: Stylet and Oral airway Placement Confirmation: ETT inserted through vocal cords under direct vision,  positive ETCO2 and breath sounds checked- equal and bilateral Secured at: 21 cm Tube secured with: Tape Dental Injury: Teeth and Oropharynx as per pre-operative assessment

## 2015-01-29 NOTE — Progress Notes (Signed)
Dr. Matilde Sprang in to see patient- aware of patient's discomforts.

## 2015-01-29 NOTE — Interval H&P Note (Signed)
History and Physical Interval Note:  01/29/2015 7:04 AM  Robyn Hill  has presented today for surgery, with the diagnosis of CYSTOCELE RECTOCELE VAULT PROLAPSE   The various methods of treatment have been discussed with the patient and family. After consideration of risks, benefits and other options for treatment, the patient has consented to  Procedure(s): CYSTOSCOPY (N/A) CYSTOCELE AND RECTOCELE  (N/A)  VAULT PROLAPSE WITH GRAFT  (N/A) as a surgical intervention .  The patient's history has been reviewed, patient examined, no change in status, stable for surgery.  I have reviewed the patient's chart and labs.  Questions were answered to the patient's satisfaction.     [name]

## 2015-01-29 NOTE — Transfer of Care (Signed)
Immediate Anesthesia Transfer of Care Note  Patient: Robyn Hill  Procedure(s) Performed: Procedure(s): CYSTOSCOPY (N/A) CYSTOCELE AND RECTOCELE  (N/A)  VAULT PROLAPSE WITH GRAFT  (N/A)  Patient Location: PACU  Anesthesia Type:General  Level of Consciousness: awake, alert  and oriented  Airway & Oxygen Therapy: Patient Spontanous Breathing and Patient connected to face mask oxygen  Post-op Assessment: Report given to RN and Post -op Vital signs reviewed and stable  Post vital signs: Reviewed and stable  Last Vitals:  Filed Vitals:   01/29/15 0546  BP: 144/74  Pulse: 78  Temp: 36.6 C  Resp: 18    Complications: No apparent anesthesia complications

## 2015-01-29 NOTE — Op Note (Signed)
Preoperative diagnosis: Cystocele, small rectocele, and vault prolapse Postoperative diagnosis: Cystocele, small rectocele, and vault prolapse Surgery: Vault prolapse repair and cystocele repair and graft and cystoscopy Surgeon: Dr. Nicki Reaper Joelys Staubs Assistant: Dr. Amaryllis Dyke  The patient has the above diagnoses and consented to the above procedure. Extra care was taken with leg positioning to minimize the risk of compartment syndrome and neuropathy and deep vein thrombosis. Under anesthesia she had an apical defect with the vaginal apex desscending approximately 4-5 cm associated with a modest grade 3 cystocele that just reached the introitus. She had some shortening of the anterior vaginal wall. She visibly had minimal rectocele.  I marked the apex with 3-0 Vicryl suture and instilled 20 mL of a lidocaine epinephrine mixture submucosally. With my Allis clamp technique I made an anterior T-shaped incision from the apex to just proximal to the bladder neck. I sharply and bluntly dissected the overlying vaginal wall mucosa from the underlying pubocervical fascia to the white line bilaterally. I was very happy with my apical mobilization not distorting the anatomy  I did cystoscope the patient this stage because when I first statrted my apical dissection I saw of watery bleb that in my opinion was a lidocaine. I  had stayed very thin on the vaginal wall mucosa. When I cystoscoped and pushed on the area in question there was obviously no bladder injury or concern. The area in question was in the midline and just left of the midline near the patient's apex and posterior wall the bladder  I did a 2 layer anatomic anterior repair with running 2-0 Vicryl suture to repair the cystocele. I did not imbricate the bladder neck.  I re-cystoscoped the patient. There was good evidence of the cystocele repair. Ureters were not distorted. There was excellent yellow jets bilaterally  I finger dissected to the  ischial spine bilaterally not causing bleeding. I was in an excellent plane sweeping all soft tissue medially. I could feel a fairly flat sacrospinous ligament bilaterally  Utilizing a Capio device I placed a 0 Ethibond 1 full finger breath medial to each spine in a straight line between the spines. It was triple checked on each side and I was very happy with the position. I did a rectal examination and there was no injury to rectum or blood.  I did appropriate dissection to mobilize the pelvic sidewall at the level of the urethrovesical angle and placed a 0 Vicryl on a UR 6 needle bilaterally  A well-prepared 10 x 6 dermal graft was cut in the shape of a trapezoid and sewed in place with the 4 sutures tension-free.  I cut an appropriate amount of anterior vaginal wall and closed the anterior vaginal wall after elevating the graft in the midline, with running 2-0 Vicryl and CT1 needle  I inspected the vagina and she had very good vaginal length and very good support anteriorly. Again she had minimal rectocele. When I did a rectal examination again there was no injury to rectum. She had elasticity of her posterior vaginal wall diffusely especially in the upper two thirds but her apex and cystocele were well supported and minimal and I pulled down with forceps. I felt a posterior repair was not necessary.   Two Estrace packs were utilized. Leg position was excellent. Blood loss was less than 100 mL. Urine output was good  I was very pleased with her surgery and hopefully the procedure will reach the patient's treatment goal

## 2015-01-29 NOTE — Anesthesia Postprocedure Evaluation (Signed)
  Anesthesia Post-op Note  Patient: Robyn Hill  Procedure(s) Performed: Procedure(s) (LRB): CYSTOSCOPY (N/A) CYSTOCELE AND RECTOCELE  (N/A)  VAULT PROLAPSE WITH GRAFT  (N/A)  Patient Location: PACU  Anesthesia Type: General  Level of Consciousness: awake and alert   Airway and Oxygen Therapy: Patient Spontanous Breathing  Post-op Pain: mild  Post-op Assessment: Post-op Vital signs reviewed, Patient's Cardiovascular Status Stable, Respiratory Function Stable, Patent Airway and No signs of Nausea or vomiting  Last Vitals:  Filed Vitals:   01/29/15 1151  BP: 142/64  Pulse: 93  Temp: 36.6 C  Resp: 12    Post-op Vital Signs: stable   Complications: No apparent anesthesia complications

## 2015-01-29 NOTE — Anesthesia Preprocedure Evaluation (Addendum)
Anesthesia Evaluation  Patient identified by MRN, date of birth, ID band Patient awake    Reviewed: Allergy & Precautions, H&P , NPO status , Patient's Chart, lab work & pertinent test results  Airway Mallampati: II  TM Distance: >3 FB Neck ROM: full    Dental no notable dental hx. (+) Teeth Intact, Dental Advisory Given,    Pulmonary neg pulmonary ROS, asthma ,  TMJ problems breath sounds clear to auscultation  Pulmonary exam normal       Cardiovascular Exercise Tolerance: Good hypertension, Pt. on medications Rhythm:Regular Rate:Normal  23-Oct-2013 10:33:39 [facility] ROUTINE RECORD Normal sinus rhythm Normal ECG   Neuro/Psych  Headaches, Glaucoma. Chronic back pain negative neurological ROS  negative psych ROS   GI/Hepatic negative GI ROS, Neg liver ROS, hiatal hernia, GERD-  Medicated and Controlled,  Endo/Other  diabetes, Poorly Controlled, Type 2, Oral Hypoglycemic AgentsHypothyroidism Adrenal mass  Renal/GU negative Renal ROS  negative genitourinary   Musculoskeletal   Abdominal   Peds  Hematology negative hematology ROS (+)   Anesthesia Other Findings Blind OD w/ limited vision OS  Reproductive/Obstetrics negative OB ROS                           Anesthesia Physical Anesthesia Plan  ASA: III  Anesthesia Plan: General   Post-op Pain Management:    Induction: Intravenous  Airway Management Planned: Oral ETT  Additional Equipment:   Intra-op Plan:   Post-operative Plan: Extubation in OR  Informed Consent: I have reviewed the patients History and Physical, chart, labs and discussed the procedure including the risks, benefits and alternatives for the proposed anesthesia with the patient or authorized representative who has indicated his/her understanding and acceptance.   Dental Advisory Given  Plan Discussed with: CRNA and Surgeon  Anesthesia Plan  Comments:         Anesthesia Quick Evaluation

## 2015-01-30 ENCOUNTER — Encounter (HOSPITAL_COMMUNITY): Payer: Self-pay | Admitting: Urology

## 2015-01-30 DIAGNOSIS — Z8542 Personal history of malignant neoplasm of other parts of uterus: Secondary | ICD-10-CM | POA: Diagnosis not present

## 2015-01-30 DIAGNOSIS — N816 Rectocele: Secondary | ICD-10-CM | POA: Diagnosis not present

## 2015-01-30 DIAGNOSIS — E780 Pure hypercholesterolemia: Secondary | ICD-10-CM | POA: Diagnosis not present

## 2015-01-30 DIAGNOSIS — K219 Gastro-esophageal reflux disease without esophagitis: Secondary | ICD-10-CM | POA: Diagnosis not present

## 2015-01-30 DIAGNOSIS — N8110 Cystocele, unspecified: Secondary | ICD-10-CM | POA: Diagnosis not present

## 2015-01-30 DIAGNOSIS — N3946 Mixed incontinence: Secondary | ICD-10-CM | POA: Diagnosis not present

## 2015-01-30 LAB — BASIC METABOLIC PANEL
Anion gap: 10 (ref 5–15)
BUN: 10 mg/dL (ref 6–23)
CO2: 30 mmol/L (ref 19–32)
Calcium: 9.4 mg/dL (ref 8.4–10.5)
Chloride: 102 mmol/L (ref 96–112)
Creatinine, Ser: 0.56 mg/dL (ref 0.50–1.10)
GFR calc Af Amer: >90 mL/min (ref >90)
GFR calc non Af Amer: >90 mL/min (ref >90)
Glucose, Bld: 160 mg/dL — ABNORMAL HIGH (ref 70–99)
Potassium: 4 mmol/L (ref 3.5–5.1)
Sodium: 142 mmol/L (ref 135–145)

## 2015-01-30 LAB — HEMOGLOBIN AND HEMATOCRIT, BLOOD
HCT: 38.3 % (ref 36.0–46.0)
Hemoglobin: 12.2 g/dL (ref 12.0–15.0)

## 2015-01-30 MED ORDER — HYDROCODONE-ACETAMINOPHEN 5-325 MG PO TABS: 1.0000 | ORAL_TABLET | Freq: Four times a day (QID) | ORAL | Status: DC | PRN

## 2015-01-30 NOTE — Progress Notes (Signed)
Patient able to void without difficulty. 14cc in bladder. MD called to report findings. OK to discharge at this time. Setzer, Marchelle Folks

## 2015-01-30 NOTE — Discharge Summary (Signed)
Date of admission: 01/29/2015  Date of discharge: 01/30/2015  Admission diagnosis: Cytoecele  Discharge diagnosis: Cystocele  Secondary diagnoses: Vault prolapse  History and Physical: For full details, please see admission history and physical. Briefly, Robyn Hill is a 69 y.o. year old patient with the above diagnosis.   Hospital Course: Prolapse surgery went well with excellent post op course  Laboratory values:  Recent Labs  01/30/15 0513  HGB 12.2  HCT 38.3    Recent Labs  01/30/15 0513  CREATININE 0.56    Disposition: Home  Discharge instruction: The patient was instructed to be ambulatory but told to refrain from heavy lifting, strenuous activity, or driving. Detailed  Discharge medications:    Medication List    TAKE these medications        atorvastatin 40 MG tablet  Commonly known as:  LIPITOR  TAKE 1 TABLET BY MOUTH EVERY DAY     BYDUREON 2 MG Pen  Generic drug:  Exenatide ER  INJECT 2MG  INTO THE SKIN ONCE WEEKLY     cholecalciferol 1000 UNITS tablet  Commonly known as:  VITAMIN D  Take 1,000 Units by mouth every morning.     dexlansoprazole 60 MG capsule  Commonly known as:  DEXILANT  Take 1 capsule (60 mg total) by mouth 2 (two) times daily.     DEXILANT 60 MG capsule  Generic drug:  dexlansoprazole  TAKE 1 CAPSULE BY MOUTH TWICE DAILY     dorzolamide-timolol 22.3-6.8 MG/ML ophthalmic solution  Commonly known as:  COSOPT  Place 1 drop into both eyes 2 (two) times daily.     DULoxetine 60 MG capsule  Commonly known as:  CYMBALTA  Take 60 mg by mouth 2 (two) times daily.     enalapril 20 MG tablet  Commonly known as:  VASOTEC  Take 1 tablet (20 mg total) by mouth daily.     ferrous gluconate 324 MG tablet  Commonly known as:  FERGON  Take 324 mg by mouth daily with breakfast.     glucose blood test strip  Pt tests once a day     hydrochlorothiazide 25 MG tablet  Commonly known as:  HYDRODIURIL  Take 1 tablet (25 mg total) by  mouth daily.     HYDROcodone-acetaminophen 10-325 MG per tablet  Commonly known as:  NORCO  Take 1 tablet by mouth every 4 (four) hours as needed for moderate pain or severe pain.     latanoprost 0.005 % ophthalmic solution  Commonly known as:  XALATAN  Place 1 drop into the right eye at bedtime.     meloxicam 7.5 MG tablet  Commonly known as:  MOBIC  Take 1 tablet (7.5 mg total) by mouth daily.     metFORMIN 1000 MG tablet  Commonly known as:  GLUCOPHAGE  TAKE 1 TABLET BY MOUTH TWICE DAILY WITH MEALS     mupirocin ointment 2 %  Commonly known as:  BACTROBAN  Apply 1 application topically 2 (two) times daily. As needed for infection on arms or legs     terbinafine 1 % cream  Commonly known as:  LAMISIL AT  Apply 1 application topically 2 (two) times daily.        Followup:      Follow-up Information    Follow up with Yamilet Mcfayden A, MD.   Specialty:  Urology   Why:  as scheduled   Contact information:   [place] [address] [phone]

## 2015-01-30 NOTE — Progress Notes (Signed)
Looks great NO leg/nerve pain Labs and vitals normal DC home with detailed instructions

## 2015-01-30 NOTE — Discharge Instructions (Signed)
I have reviewed discharge instructions in detail with the patient. They will follow-up with me or their physician as scheduled. My nurse will also be calling the patients as per protocol.   

## 2015-02-01 ENCOUNTER — Ambulatory Visit
Admission: RE | Admit: 2015-02-01 | Discharge: 2015-02-01 | Disposition: A | Discharge status: Home / Self Care | Payer: Medicare Other | Source: Ambulatory Visit | Attending: Neurosurgery | Admitting: Neurosurgery

## 2015-02-01 DIAGNOSIS — M545 Low back pain: Secondary | ICD-10-CM

## 2015-02-01 DIAGNOSIS — M4806 Spinal stenosis, lumbar region: Secondary | ICD-10-CM | POA: Diagnosis not present

## 2015-02-01 DIAGNOSIS — M5126 Other intervertebral disc displacement, lumbar region: Secondary | ICD-10-CM | POA: Diagnosis not present

## 2015-02-01 MED ORDER — GADOBENATE DIMEGLUMINE 529 MG/ML IV SOLN
19.0000 mL | Freq: Once | INTRAVENOUS | Status: AC | PRN
  Administered 2015-02-01: 19 mL via INTRAVENOUS

## 2015-02-04 DIAGNOSIS — M5417 Radiculopathy, lumbosacral region: Secondary | ICD-10-CM | POA: Diagnosis not present

## 2015-02-04 DIAGNOSIS — M545 Low back pain: Secondary | ICD-10-CM | POA: Diagnosis not present

## 2015-02-04 DIAGNOSIS — M4806 Spinal stenosis, lumbar region: Secondary | ICD-10-CM | POA: Diagnosis not present

## 2015-02-04 DIAGNOSIS — M5127 Other intervertebral disc displacement, lumbosacral region: Secondary | ICD-10-CM | POA: Diagnosis not present

## 2015-02-06 DIAGNOSIS — N816 Rectocele: Secondary | ICD-10-CM | POA: Diagnosis not present

## 2015-02-06 DIAGNOSIS — N8111 Cystocele, midline: Secondary | ICD-10-CM | POA: Diagnosis not present

## 2015-02-11 ENCOUNTER — Other Ambulatory Visit: Payer: Self-pay | Admitting: Medical

## 2015-02-15 ENCOUNTER — Other Ambulatory Visit: Payer: Self-pay | Admitting: Medical

## 2015-02-15 ENCOUNTER — Telehealth: Payer: Self-pay | Admitting: Family Medicine

## 2015-02-15 MED ORDER — EXENATIDE ER 2 MG ~~LOC~~ PEN: PEN_INJECTOR | SUBCUTANEOUS | Status: DC

## 2015-02-15 NOTE — Telephone Encounter (Signed)
Script sent  

## 2015-02-15 NOTE — Telephone Encounter (Signed)
Pt needs refill of Bydureon to [facility] on [address] in [place].  Pt will be without meds come Monday.

## 2015-02-24 ENCOUNTER — Other Ambulatory Visit: Payer: Self-pay | Admitting: Medical

## 2015-02-26 DIAGNOSIS — L282 Other prurigo: Secondary | ICD-10-CM | POA: Diagnosis not present

## 2015-02-26 DIAGNOSIS — L570 Actinic keratosis: Secondary | ICD-10-CM | POA: Diagnosis not present

## 2015-02-26 DIAGNOSIS — B353 Tinea pedis: Secondary | ICD-10-CM | POA: Diagnosis not present

## 2015-03-15 ENCOUNTER — Other Ambulatory Visit: Payer: Self-pay | Admitting: Medical

## 2015-03-15 NOTE — Telephone Encounter (Signed)
Are these okay to refill? 

## 2015-04-03 DIAGNOSIS — H4011X3 Primary open-angle glaucoma, severe stage: Secondary | ICD-10-CM | POA: Diagnosis not present

## 2015-04-16 ENCOUNTER — Other Ambulatory Visit: Payer: Self-pay | Admitting: Medical

## 2015-04-17 ENCOUNTER — Encounter: Payer: Self-pay | Admitting: Medical

## 2015-04-17 ENCOUNTER — Ambulatory Visit (INDEPENDENT_AMBULATORY_CARE_PROVIDER_SITE_OTHER): Payer: Medicare Other | Admitting: Medical

## 2015-04-17 VITALS — BP 140/80 | HR 78 | Temp 97.6°F | Resp 14 | Wt 206.0 lb

## 2015-04-17 DIAGNOSIS — M549 Dorsalgia, unspecified: Secondary | ICD-10-CM | POA: Diagnosis not present

## 2015-04-17 DIAGNOSIS — M1990 Unspecified osteoarthritis, unspecified site: Secondary | ICD-10-CM | POA: Diagnosis not present

## 2015-04-17 DIAGNOSIS — G8929 Other chronic pain: Secondary | ICD-10-CM

## 2015-04-17 DIAGNOSIS — G629 Polyneuropathy, unspecified: Secondary | ICD-10-CM | POA: Diagnosis not present

## 2015-04-17 DIAGNOSIS — IMO0002 Reserved for concepts with insufficient information to code with codable children: Secondary | ICD-10-CM

## 2015-04-17 DIAGNOSIS — E1165 Type 2 diabetes mellitus with hyperglycemia: Secondary | ICD-10-CM | POA: Diagnosis not present

## 2015-04-17 DIAGNOSIS — N8111 Cystocele, midline: Secondary | ICD-10-CM | POA: Diagnosis not present

## 2015-04-17 DIAGNOSIS — D179 Benign lipomatous neoplasm, unspecified: Secondary | ICD-10-CM

## 2015-04-17 DIAGNOSIS — N816 Rectocele: Secondary | ICD-10-CM | POA: Diagnosis not present

## 2015-04-17 LAB — POCT GLYCOSYLATED HEMOGLOBIN (HGB A1C): Hemoglobin A1C: 6.6

## 2015-04-17 NOTE — Progress Notes (Signed)
  Subjective:   Robyn Hill is an 69 y.o. female who presents for follow up of Type 2 diabetes mellitus and med check.   Patient is checking home blood sugars.   Home blood sugar records: 134 this morning Current symptoms include: none. Patient denies no concerns.  Patient is checking their feet daily. Foot concerns (callous, ulcer, wound, thickened nails, toenail fungus, skin fungus, hammer toe): no concerns Last dilated eye exam  6/ 2016  Current treatments: compliant Medication compliance: good  Current diet: in general, a "healthy" diet   Current exercise: none Known diabetic complications: none  C/o ongoing arthritis of hands, and chronic back pain.   Mobic doesn't help. Didn't tolerate gabapentin in the past for neuropathy and pain.   Won't dare go on lyrica given husband's problems with this.  Has lipomas all over that hurt, bilat mid chest and abdomen wall, wants to have them cut out  Tired all the time.  The following portions of the patient's history were reviewed and updated as appropriate: allergies, current medications, past family history, past medical history, past social history, past surgical history and problem list.  ROS as in subjective above    Objective:   BP 140/80 mmHg  Pulse 78  Temp(Src) 97.6 F (36.4 C) (Oral)  Resp 14  Wt 206 lb (93.441 kg)  Gen: wd, wn, nad Heart: RRR, normal S1, S2, no murmurs Lungs clear 1+ pedal pulses Ext: no edema, no cyanosis, no clubbing Neuro: decreased sensation of bilat feet throughout with monofilament Tender roundish spongy masses subcutaneous suggestive of lipomas, 3-4 cm diameter of left and right lower thoracic posterolateral regions Neck: supple, no mass, no thyromegaly, no lymphadenopathy   Assessment:   Encounter Diagnoses  Name Primary?  . Type II diabetes mellitus, uncontrolled Yes  . Peripheral neuropathy   . Chronic back pain   . Arthritis   . Lipoma      Plan:   Diabetes type 2  -  HgbA1C at goal, c/t current medications, reviewed most recent labs Peripheral neurphaty - begin nortriptyline trial.  Avoid gabapentin and lyrica given prior issues.  No prior trial of Topamax chronic back pain - sees neurosurgery, begin nortriptyline. Back off Cymbalta to once daily arthritis - stop mobic, change to trial of celebrex Lipoma - advised against surgery.  Lets see if nortriptyline helps.  F/u 32mo

## 2015-04-18 ENCOUNTER — Telehealth: Payer: Self-pay | Admitting: Medical

## 2015-04-18 DIAGNOSIS — E1165 Type 2 diabetes mellitus with hyperglycemia: Secondary | ICD-10-CM | POA: Insufficient documentation

## 2015-04-18 DIAGNOSIS — G629 Polyneuropathy, unspecified: Secondary | ICD-10-CM | POA: Insufficient documentation

## 2015-04-18 DIAGNOSIS — IMO0002 Reserved for concepts with insufficient information to code with codable children: Secondary | ICD-10-CM | POA: Insufficient documentation

## 2015-04-18 DIAGNOSIS — M1990 Unspecified osteoarthritis, unspecified site: Secondary | ICD-10-CM | POA: Insufficient documentation

## 2015-04-18 MED ORDER — CELECOXIB 200 MG PO CAPS: 200.0000 mg | ORAL_CAPSULE | Freq: Two times a day (BID) | ORAL | Status: DC

## 2015-04-18 MED ORDER — DEXLANSOPRAZOLE 60 MG PO CPDR: 1.0000 | DELAYED_RELEASE_CAPSULE | Freq: Two times a day (BID) | ORAL | Status: DC

## 2015-04-18 MED ORDER — EXENATIDE ER 2 MG ~~LOC~~ PEN: PEN_INJECTOR | SUBCUTANEOUS | Status: DC

## 2015-04-18 MED ORDER — DICLOFENAC SODIUM 1 % TD GEL: 2.0000 g | Freq: Four times a day (QID) | TRANSDERMAL | Status: DC

## 2015-04-18 MED ORDER — METFORMIN HCL 1000 MG PO TABS: 1000.0000 mg | ORAL_TABLET | Freq: Two times a day (BID) | ORAL | Status: DC

## 2015-04-18 MED ORDER — HYDROCHLOROTHIAZIDE 25 MG PO TABS: 25.0000 mg | ORAL_TABLET | Freq: Every day | ORAL | Status: DC

## 2015-04-18 MED ORDER — DULOXETINE HCL 60 MG PO CPEP: 60.0000 mg | ORAL_CAPSULE | Freq: Every day | ORAL | Status: DC

## 2015-04-18 MED ORDER — NORTRIPTYLINE HCL 10 MG PO CAPS: 10.0000 mg | ORAL_CAPSULE | Freq: Every day | ORAL | Status: DC

## 2015-04-18 MED ORDER — ATORVASTATIN CALCIUM 40 MG PO TABS: 40.0000 mg | ORAL_TABLET | Freq: Every day | ORAL | Status: DC

## 2015-04-18 MED ORDER — ENALAPRIL MALEATE 20 MG PO TABS: 20.0000 mg | ORAL_TABLET | Freq: Every day | ORAL | Status: DC

## 2015-04-18 NOTE — Addendum Note (Signed)
Addended by: Carlena Hurl on: 04/18/2015 05:07 PM   Modules accepted: Orders

## 2015-04-18 NOTE — Telephone Encounter (Signed)
Also, I sent Diclofenac topical.  If this gets covered and not too expensive, try this first intead of oral Celebrex for arthritis pain.

## 2015-04-18 NOTE — Telephone Encounter (Signed)
Peripheral neuropathy - begin trial of nortriptyline once daily.  Cut back to Cymbalta once daily instead of BID  Arthritis - stop mobic, change to trial of celebrex BID  Lipoma - advised against surgery.  Lets see if nortriptyline helps.   F/u 57mo

## 2015-04-23 NOTE — Telephone Encounter (Signed)
I went over [employer],  [place] message in detail about medication changes and instructions. Patient states that she understood everything.

## 2015-04-24 DIAGNOSIS — M5127 Other intervertebral disc displacement, lumbosacral region: Secondary | ICD-10-CM | POA: Diagnosis not present

## 2015-04-24 DIAGNOSIS — M4806 Spinal stenosis, lumbar region: Secondary | ICD-10-CM | POA: Diagnosis not present

## 2015-04-24 DIAGNOSIS — I10 Essential (primary) hypertension: Secondary | ICD-10-CM | POA: Diagnosis not present

## 2015-04-24 DIAGNOSIS — Z6835 Body mass index (BMI) 35.0-35.9, adult: Secondary | ICD-10-CM | POA: Diagnosis not present

## 2015-04-24 DIAGNOSIS — M5417 Radiculopathy, lumbosacral region: Secondary | ICD-10-CM | POA: Diagnosis not present

## 2015-04-24 DIAGNOSIS — M4310 Spondylolisthesis, site unspecified: Secondary | ICD-10-CM | POA: Diagnosis not present

## 2015-04-24 DIAGNOSIS — M545 Low back pain: Secondary | ICD-10-CM | POA: Diagnosis not present

## 2015-04-30 ENCOUNTER — Other Ambulatory Visit: Payer: Self-pay | Admitting: Medical

## 2015-05-01 ENCOUNTER — Other Ambulatory Visit: Payer: Self-pay | Admitting: Medical

## 2015-05-01 ENCOUNTER — Telehealth: Payer: Self-pay | Admitting: Medical

## 2015-05-01 MED ORDER — DICLOFENAC SODIUM 50 MG PO TBEC: 50.0000 mg | DELAYED_RELEASE_TABLET | Freq: Two times a day (BID) | ORAL | Status: DC

## 2015-05-01 MED ORDER — ASPIRIN EC 81 MG PO TBEC: 81.0000 mg | DELAYED_RELEASE_TABLET | Freq: Every day | ORAL | Status: DC

## 2015-05-01 NOTE — Telephone Encounter (Signed)
Patient is aware of Dorothea Ogle PA message and Patient states that the Celebrex is working great for her and she would like to continue. I explain to the patient that I will check with Mickel Baas and see if she received a prior authorization on this medication for her.

## 2015-05-01 NOTE — Telephone Encounter (Signed)
Robyn Hill, have you received a prior authorization on Celebrex for this patient?

## 2015-05-01 NOTE — Telephone Encounter (Signed)
Insurance sent prescription flags to me.   They won't pay for Celebrex and they didn't like the Pamelor/nortriptyline.  I sent back the statement that Gabapentin was failed, she won't go for Lyrica given the issues with [facility] experience, so Nortriptyline for neuropathy is a good option.   I changed her antiinflammatory to Diclofenac BID in place of Celebrex unless the topical NSAID is working ok.   If so, just use the topical.  Also begin 81mg  Aspirin for heart health if not already doing this.

## 2015-05-06 ENCOUNTER — Telehealth: Payer: Self-pay | Admitting: Family Medicine

## 2015-05-06 NOTE — Telephone Encounter (Signed)
What can we do to initiate prior auth

## 2015-05-06 NOTE — Telephone Encounter (Signed)
Patient called and said that she spoke with her insurance company and they said they would pay for the Celebrex but we would need to do a Prior Authorization for the medication. Patient states that she has been taking Celebrex twice a day and taking the Nortriptyline at night and everything is work well. She states that she is doing great. Patent states that the Diclofenac didn't help at all. She's not taking this at all anymore.

## 2015-05-07 NOTE — Telephone Encounter (Signed)
Initiated P.A. Celecoxib & Nortriptyline to [facility] fax t# [phone] ID# [id number] Called pt & informed faxed P.A's

## 2015-05-09 NOTE — Telephone Encounter (Signed)
Called [employer] regarding fax response,  Celecoxib doesn't require a P.A. If stays under quanity limits of #60 per 30 & is Tier 2 $1.20.  Rec'd approval for Nortriptyline til 05/06/16 Pt informed

## 2015-06-13 ENCOUNTER — Other Ambulatory Visit: Payer: Self-pay | Admitting: Medical

## 2015-06-13 IMAGING — CT CT ABD-PELV W/ CM
2 of 5 series · 16 of 46 positions shown, 18 images · IV contrast (Omni 300)
Comparison: 03/06/1999 well

CLINICAL DATA: Right lower quadrant pain radiating into the lower
back and down the legs. Recent kidney infection.

EXAM:
CT ABDOMEN AND PELVIS WITH CONTRAST
TECHNIQUE: Multidetector CT imaging of the abdomen and pelvis was performed
using the standard protocol following bolus administration of
intravenous contrast.
CONTRAST:  100mL OMNIPAQUE IOHEXOL 300 MG/ML  SOLN

[Series 2: abd/ pelvis 5.0 i30f 1 · axial · 0.96mm/px · z∈[+773,+1223]mm · 13 of 102 slices shown, 15 images]
[im 6/102  soft-tissue]
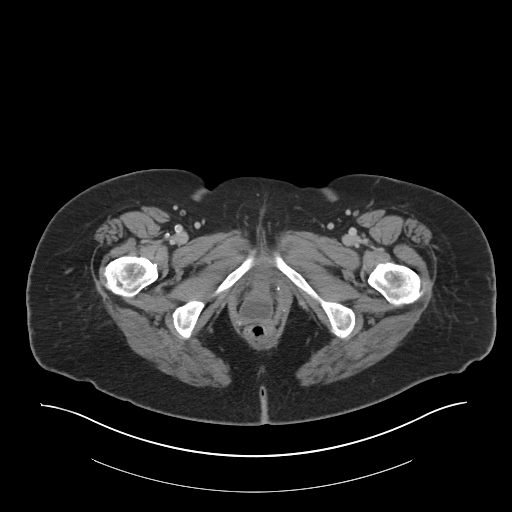
[im 6/102  bone]
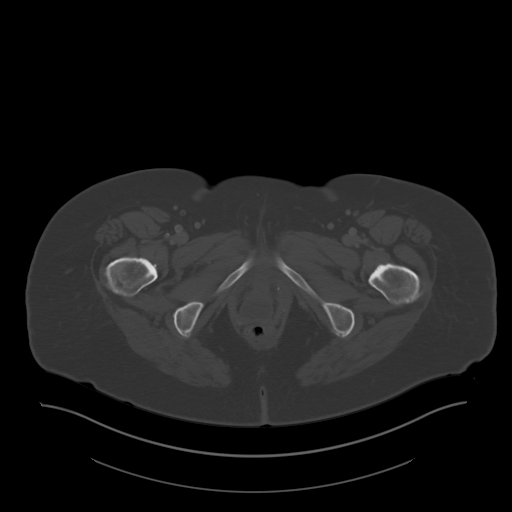
[im 12/102  soft-tissue]
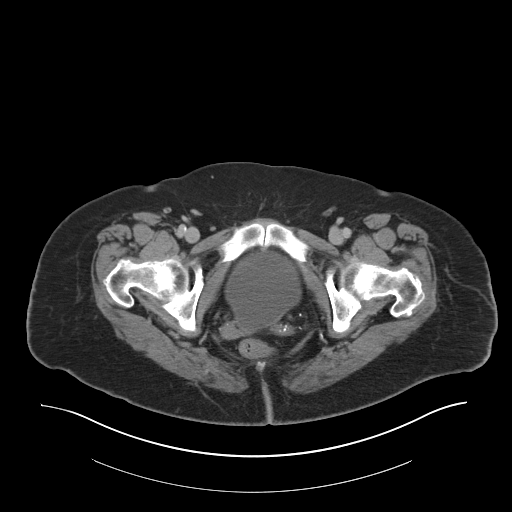
[im 23/102  soft-tissue]
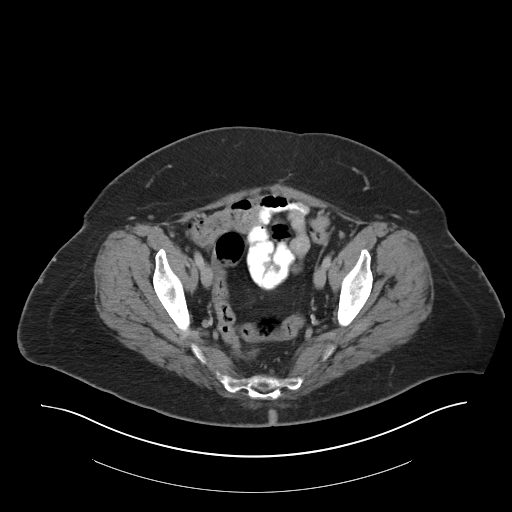
[im 29/102  soft-tissue]
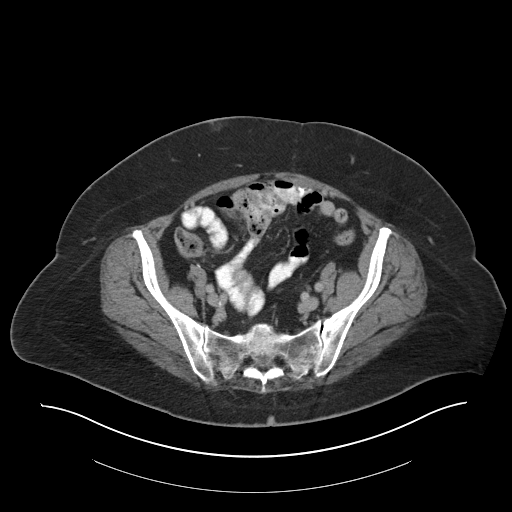
[im 34/102  soft-tissue]
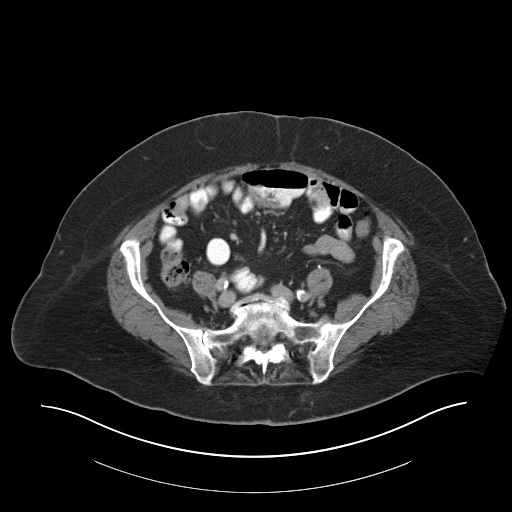
[im 45/102  soft-tissue]
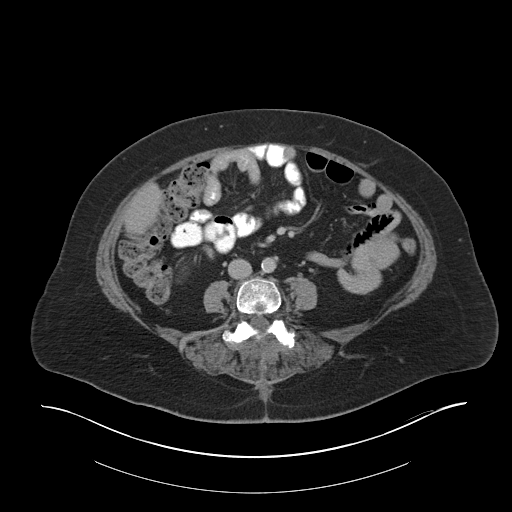
[im 51/102  soft-tissue]
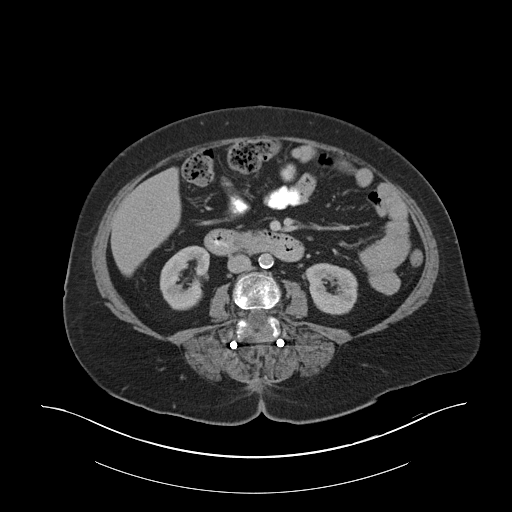
[im 57/102  soft-tissue]
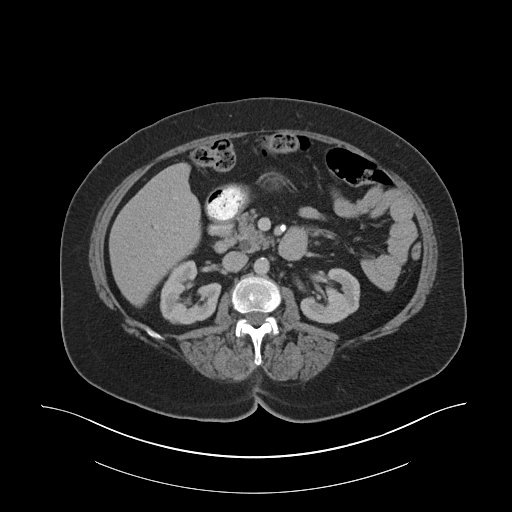
[im 68/102  soft-tissue]
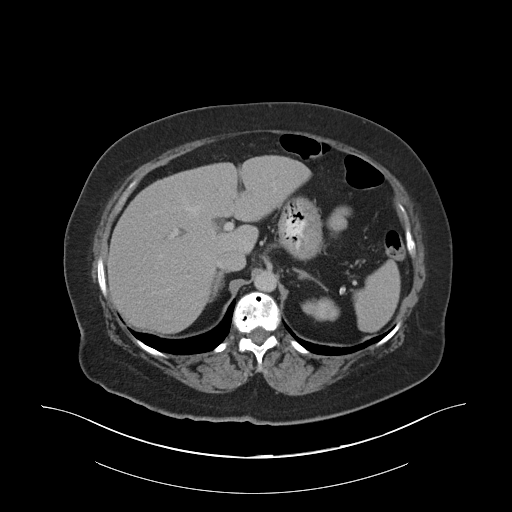
[im 68/102  bone]
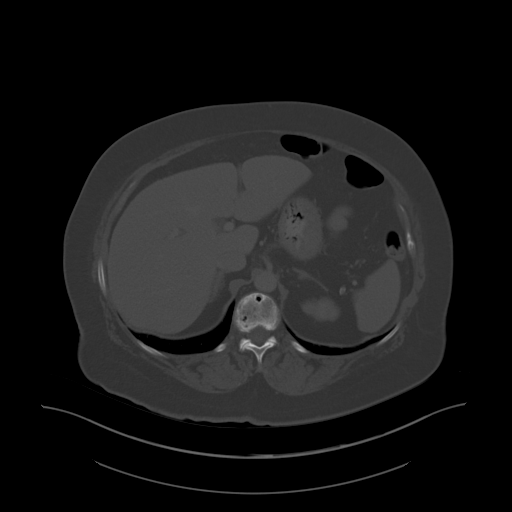
[im 73/102  soft-tissue]
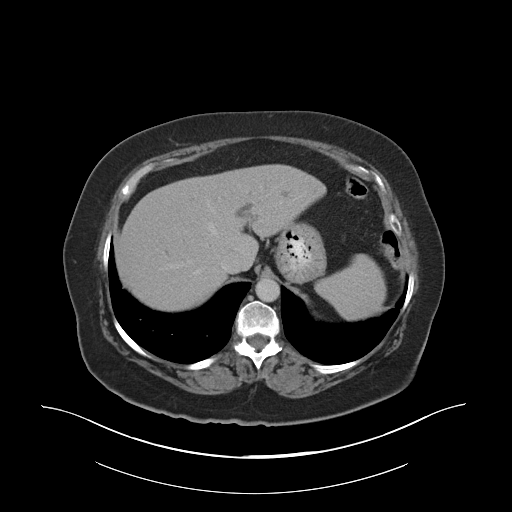
[im 79/102  soft-tissue]
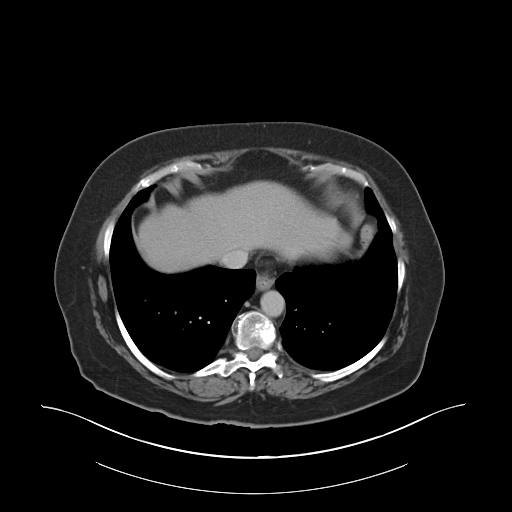
[im 90/102  soft-tissue]
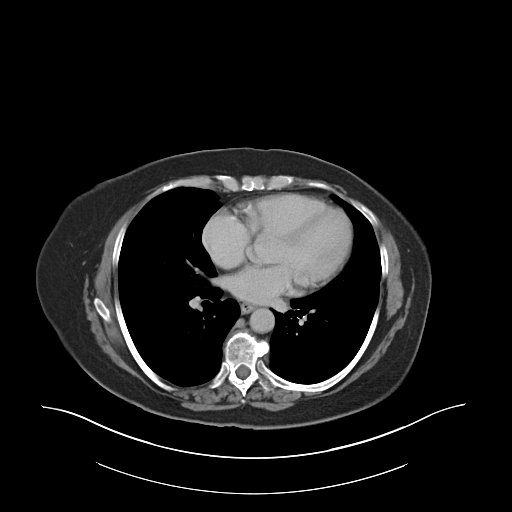
[im 96/102  soft-tissue]
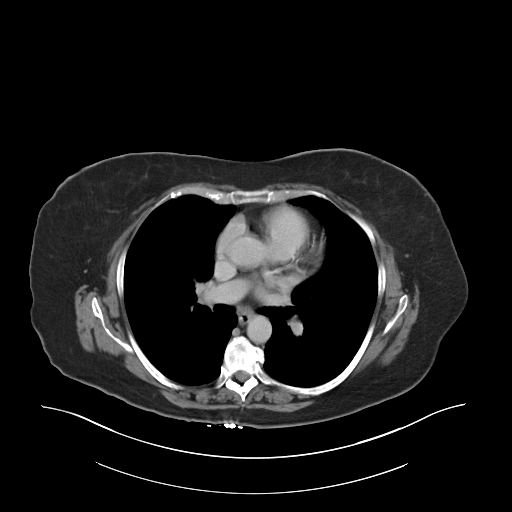

[Series 5: coronals · coronal · 0.79mm/px · 3 of 152 slices shown]
[im 51/152  soft-tissue]
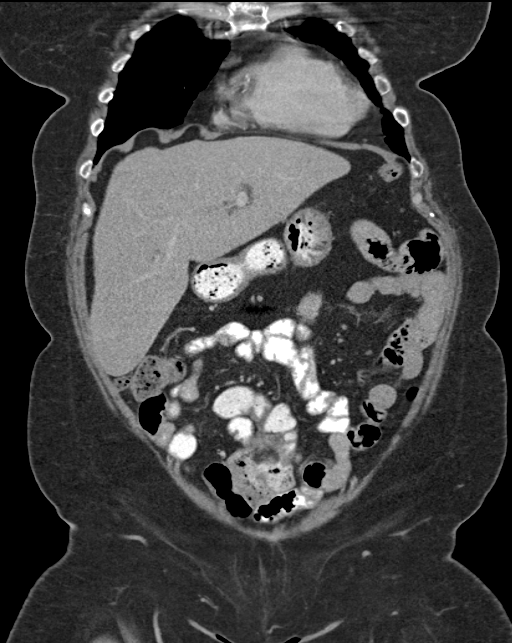
[im 68/152  soft-tissue]
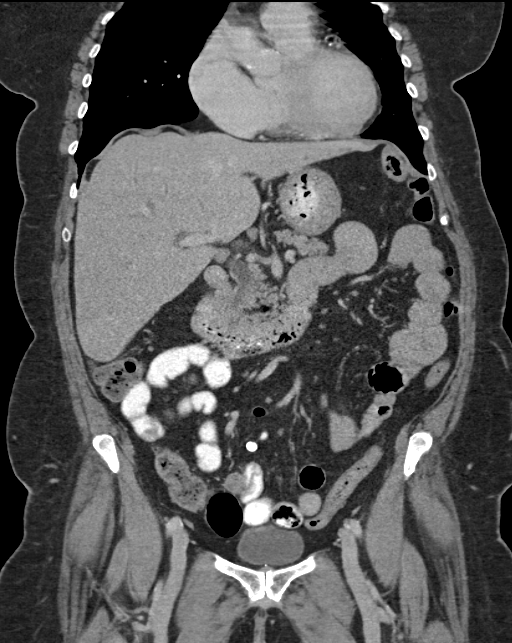
[im 84/152  soft-tissue]
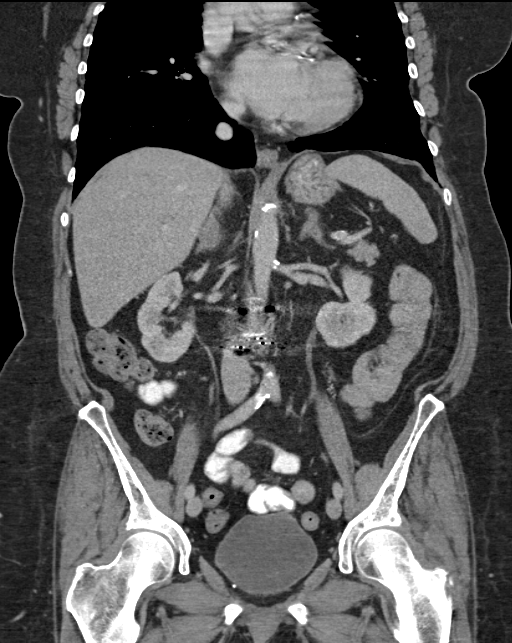

[16 of 46 positions shown; findings below may reference images not displayed]

FINDINGS: Lung bases are clear allowing for respiratory motion artifact. Small
amount of residual contrast material in the esophagus may indicate
reflux or decreased motility. Coronary artery calcifications.

Surgical absence of the gallbladder. Mild bile duct dilatation is
likely normal for postoperative physiology. The liver, spleen,
pancreas, kidneys, abdominal aorta, inferior vena cava, and
retroperitoneal lymph nodes are unremarkable. There is a right
adrenal gland nodule measuring 2.2 cm diameter. This was present on
previous CT scan from 03/06/2011 and is unchanged. Likely benign.
Stomach and small bowel are normal for degree of distention.
Stool-filled colon without dilatation. No free air or free fluid in
the abdomen.

Pelvis: Pelvic floor laxity with cystocele. Bladder wall is not
thickened. Appendix is normal. No evidence of diverticulitis. No
free air or free fluid in the abdomen. No abnormal pelvic mass or
lymphadenopathy. Postoperative changes and degenerative changes in
the lumbar spine. No destructive bone lesions appreciated.
IMPRESSION: No focal acute process is demonstrated that would account for the
history of right lower quadrant pain. Stable appearance of left
adrenal gland lesion, likely benign. Pelvic floor laxity with
cystocele. Surgical absence of the gallbladder.

## 2015-06-13 NOTE — Telephone Encounter (Signed)
Is this okay to refill? 

## 2015-06-19 DIAGNOSIS — M545 Low back pain: Secondary | ICD-10-CM | POA: Diagnosis not present

## 2015-06-19 DIAGNOSIS — M961 Postlaminectomy syndrome, not elsewhere classified: Secondary | ICD-10-CM | POA: Diagnosis not present

## 2015-07-04 DIAGNOSIS — H4011X3 Primary open-angle glaucoma, severe stage: Secondary | ICD-10-CM | POA: Diagnosis not present

## 2015-07-08 DIAGNOSIS — F4542 Pain disorder with related psychological factors: Secondary | ICD-10-CM | POA: Diagnosis not present

## 2015-07-12 DIAGNOSIS — F4542 Pain disorder with related psychological factors: Secondary | ICD-10-CM | POA: Diagnosis not present

## 2015-07-17 ENCOUNTER — Other Ambulatory Visit: Payer: Self-pay | Admitting: Medical

## 2015-07-22 ENCOUNTER — Telehealth: Payer: Self-pay

## 2015-07-22 ENCOUNTER — Other Ambulatory Visit: Payer: Self-pay | Admitting: Medical

## 2015-07-22 MED ORDER — ATORVASTATIN CALCIUM 40 MG PO TABS: 40.0000 mg | ORAL_TABLET | Freq: Every day | ORAL | Status: DC

## 2015-07-22 NOTE — Telephone Encounter (Signed)
Refill request for Atorvastatin 40mg  #90

## 2015-07-23 DIAGNOSIS — M961 Postlaminectomy syndrome, not elsewhere classified: Secondary | ICD-10-CM | POA: Diagnosis not present

## 2015-07-23 DIAGNOSIS — M4310 Spondylolisthesis, site unspecified: Secondary | ICD-10-CM | POA: Diagnosis not present

## 2015-07-23 DIAGNOSIS — M545 Low back pain: Secondary | ICD-10-CM | POA: Diagnosis not present

## 2015-07-30 ENCOUNTER — Other Ambulatory Visit: Payer: Self-pay | Admitting: Medical

## 2015-07-30 DIAGNOSIS — M4806 Spinal stenosis, lumbar region: Secondary | ICD-10-CM | POA: Diagnosis not present

## 2015-07-30 DIAGNOSIS — M961 Postlaminectomy syndrome, not elsewhere classified: Secondary | ICD-10-CM | POA: Diagnosis not present

## 2015-07-30 DIAGNOSIS — M545 Low back pain: Secondary | ICD-10-CM | POA: Diagnosis not present

## 2015-07-30 DIAGNOSIS — M4310 Spondylolisthesis, site unspecified: Secondary | ICD-10-CM | POA: Diagnosis not present

## 2015-07-30 DIAGNOSIS — M5417 Radiculopathy, lumbosacral region: Secondary | ICD-10-CM | POA: Diagnosis not present

## 2015-07-30 DIAGNOSIS — M5127 Other intervertebral disc displacement, lumbosacral region: Secondary | ICD-10-CM | POA: Diagnosis not present

## 2015-07-30 DIAGNOSIS — Z6836 Body mass index (BMI) 36.0-36.9, adult: Secondary | ICD-10-CM | POA: Diagnosis not present

## 2015-08-06 ENCOUNTER — Ambulatory Visit (INDEPENDENT_AMBULATORY_CARE_PROVIDER_SITE_OTHER): Payer: Medicare Other | Admitting: Medical

## 2015-08-06 ENCOUNTER — Encounter: Payer: Self-pay | Admitting: Medical

## 2015-08-06 VITALS — BP 124/70 | HR 74 | Wt 211.0 lb

## 2015-08-06 DIAGNOSIS — M1990 Unspecified osteoarthritis, unspecified site: Secondary | ICD-10-CM

## 2015-08-06 DIAGNOSIS — M549 Dorsalgia, unspecified: Secondary | ICD-10-CM

## 2015-08-06 DIAGNOSIS — Z23 Encounter for immunization: Secondary | ICD-10-CM | POA: Diagnosis not present

## 2015-08-06 DIAGNOSIS — G8929 Other chronic pain: Secondary | ICD-10-CM

## 2015-08-06 DIAGNOSIS — Z8639 Personal history of other endocrine, nutritional and metabolic disease: Secondary | ICD-10-CM | POA: Diagnosis not present

## 2015-08-06 DIAGNOSIS — E785 Hyperlipidemia, unspecified: Secondary | ICD-10-CM | POA: Diagnosis not present

## 2015-08-06 DIAGNOSIS — G609 Hereditary and idiopathic neuropathy, unspecified: Secondary | ICD-10-CM | POA: Diagnosis not present

## 2015-08-06 DIAGNOSIS — E1141 Type 2 diabetes mellitus with diabetic mononeuropathy: Secondary | ICD-10-CM | POA: Diagnosis not present

## 2015-08-06 DIAGNOSIS — G589 Mononeuropathy, unspecified: Secondary | ICD-10-CM | POA: Diagnosis not present

## 2015-08-06 LAB — COMPREHENSIVE METABOLIC PANEL
ALT: 38 U/L — ABNORMAL HIGH (ref 6–29)
AST: 24 U/L (ref 10–35)
Albumin: 4.1 g/dL (ref 3.6–5.1)
Alkaline Phosphatase: 101 U/L (ref 33–130)
BUN: 14 mg/dL (ref 7–25)
CO2: 29 mmol/L (ref 20–31)
Calcium: 9.4 mg/dL (ref 8.6–10.4)
Chloride: 101 mmol/L (ref 98–110)
Creat: 0.67 mg/dL (ref 0.50–0.99)
Glucose, Bld: 127 mg/dL — ABNORMAL HIGH (ref 65–99)
Potassium: 4.9 mmol/L (ref 3.5–5.3)
Sodium: 141 mmol/L (ref 135–146)
Total Bilirubin: 0.4 mg/dL (ref 0.2–1.2)
Total Protein: 6.6 g/dL (ref 6.1–8.1)

## 2015-08-06 LAB — CBC
HCT: 37.9 % (ref 36.0–46.0)
Hemoglobin: 12.6 g/dL (ref 12.0–15.0)
MCH: 28.1 pg (ref 26.0–34.0)
MCHC: 33.2 g/dL (ref 30.0–36.0)
MCV: 84.6 fL (ref 78.0–100.0)
MPV: 10.6 fL (ref 8.6–12.4)
Platelets: 318 10*3/uL (ref 150–400)
RBC: 4.48 MIL/uL (ref 3.87–5.11)
RDW: 13.2 % (ref 11.5–15.5)
WBC: 9.8 10*3/uL (ref 4.0–10.5)

## 2015-08-06 LAB — LIPID PANEL
Cholesterol: 137 mg/dL (ref 125–200)
HDL: 54 mg/dL (ref >=46)
LDL Cholesterol: 58 mg/dL (ref <130)
Total CHOL/HDL Ratio: 2.5 Ratio (ref <=5.0)
Triglycerides: 127 mg/dL (ref <150)
VLDL: 25 mg/dL (ref <30)

## 2015-08-06 LAB — T4, FREE: Free T4: 0.93 ng/dL (ref 0.80–1.80)

## 2015-08-06 LAB — TSH: TSH: 1.04 u[IU]/mL (ref 0.350–4.500)

## 2015-08-06 MED ORDER — DEXLANSOPRAZOLE 60 MG PO CPDR: 1.0000 | DELAYED_RELEASE_CAPSULE | Freq: Two times a day (BID) | ORAL | Status: DC

## 2015-08-06 MED ORDER — DULOXETINE HCL 60 MG PO CPEP
60.0000 mg | ORAL_CAPSULE | Freq: Two times a day (BID) | ORAL | Status: DC
Start: 2015-08-06 — End: 2016-02-20

## 2015-08-06 MED ORDER — CELECOXIB 200 MG PO CAPS: 200.0000 mg | ORAL_CAPSULE | Freq: Two times a day (BID) | ORAL | Status: DC

## 2015-08-06 MED ORDER — EXENATIDE ER 2 MG ~~LOC~~ PEN: 2.0000 mg | PEN_INJECTOR | SUBCUTANEOUS | Status: DC

## 2015-08-06 NOTE — Progress Notes (Signed)
Subjective: Chief Complaint  Patient presents with  . Med check    hd flu given. no problems with medication. fasting.    Saw Dr. Maryjean Ka recently, just did a trial of spinal stimulator, had great relief.    They are just waiting on paperwork now to go forward with 2 lead spinal stimulator for chronic back pain.    Diabetes type 2 - checking glucose regularly, seeing average 133. Ranging 119-152.  Eating relatively healthy, has gained some weight.  Walking for exercise.    Taking Bydureon 2mg  weekly, Metformin 1000mg  BID.  Hyperlipidemia - compliant with Lipitor 40mg  without c/o  Neuropathy - taking Cymbalta 60mg  daily.  Last visit we added Nortriptyline 10mg  but Dr. Lovenia Shuck has bumped her up to 20mg  daily.     arthritis - taking celebrex 200mg  BID, which works much better than mobic prior.   Incontinence - doing fine since she had the procedure a few months ago with urology  Had mammogram last year at [facility] although we never got a copy.   Ready for flu shot today.   Objective: BP 124/70 mmHg  Pulse 74  Wt 211 lb (95.709 kg)  Gen: wd, wn, nad Neck: supple, nontender, no mass, no thyromegaly Heart: RRR, normal s1, s2, no murmurs Lungs clear Ext: slight bilat LE edema, nonpitting 1 + pedal pulses bilat, UE pulses normal Abdomen: +bs, soft, surgical port scars noted, nontender, no mass, no organomegaly Tender thorough hands, multiple fingers with arthritis changes of DIP and PIPs, right thumb particularly tender    Assessment: Encounter Diagnoses  Name Primary?  . Type 2 diabetes mellitus with diabetic mononeuropathy, without long-term current use of insulin ([place]) Yes  . Hyperlipidemia   . Chronic back pain   . Idiopathic peripheral neuropathy (HCC)   . Arthritis   . History of thyroid disease   . Need for prophylactic vaccination and inoculation against influenza      Plan diabetes type 2 - labs today, c/t Bydureon 2mg  weekly, Metformin 1000mg  BID, c/t  monitoring, daily foot checks, yearly eye exam. Hyperlipidemia - labs today, c/t Lipitor 40mg  daily Chronic back pain - glad to hear she is seeing improvement with medication and spinal stimulator.  She is being managed by neurosurgery now arthritis - c/t celebrex, and she can look into [facility] about topical compounded therapy Thyroid labs today Counseled on the influenza virus vaccine.  Vaccine information sheet given.  Influenza vaccine given after consent obtained.

## 2015-08-06 NOTE — Addendum Note (Signed)
Addended by: Carlena Hurl on: 08/06/2015 11:52 AM   Modules accepted: Orders, Medications

## 2015-08-06 NOTE — Patient Instructions (Signed)
Diabetes:  Continue to monitor your glucose  See the eye doctor yearly  Check your feet daily for wounds and sores that may not be healing  We will call with lab results  Call [facility] on [address] to inquire about costs for arthritis topical compounded cream.  I'll be glad to order this if cost is not unreasonable  Glad to hear the spinal cord stimulator may provide some relief.    Eat healthy, get some exercise regularly.  [facility] Address: [place], [place], [place] [address] Hours: Open today  9AM-7PM Phone: [phone]

## 2015-08-07 ENCOUNTER — Other Ambulatory Visit: Payer: Self-pay | Admitting: Medical

## 2015-08-07 LAB — HEMOGLOBIN A1C
Hgb A1c MFr Bld: 7.4 % — ABNORMAL HIGH (ref <5.7)
Mean Plasma Glucose: 166 mg/dL — ABNORMAL HIGH (ref <117)

## 2015-08-07 MED ORDER — INSULIN GLARGINE 300 UNIT/ML ~~LOC~~ SOPN: 5.0000 [IU] | PEN_INJECTOR | Freq: Every day | SUBCUTANEOUS | Status: DC

## 2015-08-07 MED ORDER — METFORMIN HCL 1000 MG PO TABS: 1000.0000 mg | ORAL_TABLET | Freq: Two times a day (BID) | ORAL | Status: DC

## 2015-08-07 MED ORDER — ATORVASTATIN CALCIUM 40 MG PO TABS: 40.0000 mg | ORAL_TABLET | Freq: Every day | ORAL | Status: DC

## 2015-08-07 MED ORDER — EXENATIDE ER 2 MG ~~LOC~~ PEN: 2.0000 mg | PEN_INJECTOR | SUBCUTANEOUS | Status: DC

## 2015-08-08 ENCOUNTER — Other Ambulatory Visit: Payer: Self-pay | Admitting: Medical

## 2015-08-08 ENCOUNTER — Encounter: Payer: Self-pay | Admitting: Medical

## 2015-08-08 ENCOUNTER — Telehealth: Payer: Self-pay | Admitting: Medical

## 2015-08-08 MED ORDER — INSULIN GLARGINE 300 UNIT/ML ~~LOC~~ SOPN: 5.0000 [IU] | PEN_INJECTOR | Freq: Every day | SUBCUTANEOUS | Status: DC

## 2015-08-08 NOTE — Telephone Encounter (Signed)
Call out needles for the Toujeo pen

## 2015-08-09 ENCOUNTER — Encounter: Payer: Self-pay | Admitting: Medical

## 2015-08-09 MED ORDER — PEN NEEDLES 31G X 6 MM MISC: Status: DC

## 2015-08-09 NOTE — Telephone Encounter (Signed)
Sent in to pharmacy.  

## 2015-08-20 ENCOUNTER — Encounter: Payer: Self-pay | Admitting: Medical

## 2015-08-28 DIAGNOSIS — L570 Actinic keratosis: Secondary | ICD-10-CM | POA: Diagnosis not present

## 2015-08-30 ENCOUNTER — Other Ambulatory Visit: Payer: Self-pay | Admitting: Anesthesiology

## 2015-09-03 ENCOUNTER — Encounter (HOSPITAL_COMMUNITY)
Admission: RE | Admit: 2015-09-03 | Discharge: 2015-09-03 | Disposition: A | Discharge status: Home / Self Care | Payer: Medicare Other | Source: Ambulatory Visit | Attending: Anesthesiology | Admitting: Anesthesiology

## 2015-09-03 ENCOUNTER — Encounter (HOSPITAL_COMMUNITY): Payer: Self-pay

## 2015-09-03 DIAGNOSIS — Z794 Long term (current) use of insulin: Secondary | ICD-10-CM | POA: Diagnosis not present

## 2015-09-03 DIAGNOSIS — Z7982 Long term (current) use of aspirin: Secondary | ICD-10-CM | POA: Diagnosis not present

## 2015-09-03 DIAGNOSIS — Z79899 Other long term (current) drug therapy: Secondary | ICD-10-CM | POA: Diagnosis not present

## 2015-09-03 DIAGNOSIS — E1140 Type 2 diabetes mellitus with diabetic neuropathy, unspecified: Secondary | ICD-10-CM | POA: Diagnosis not present

## 2015-09-03 DIAGNOSIS — K219 Gastro-esophageal reflux disease without esophagitis: Secondary | ICD-10-CM | POA: Diagnosis not present

## 2015-09-03 DIAGNOSIS — Z981 Arthrodesis status: Secondary | ICD-10-CM | POA: Diagnosis not present

## 2015-09-03 DIAGNOSIS — G8929 Other chronic pain: Secondary | ICD-10-CM | POA: Diagnosis not present

## 2015-09-03 DIAGNOSIS — E039 Hypothyroidism, unspecified: Secondary | ICD-10-CM | POA: Diagnosis not present

## 2015-09-03 DIAGNOSIS — E785 Hyperlipidemia, unspecified: Secondary | ICD-10-CM | POA: Diagnosis not present

## 2015-09-03 DIAGNOSIS — M1990 Unspecified osteoarthritis, unspecified site: Secondary | ICD-10-CM | POA: Diagnosis not present

## 2015-09-03 DIAGNOSIS — Z791 Long term (current) use of non-steroidal anti-inflammatories (NSAID): Secondary | ICD-10-CM | POA: Diagnosis not present

## 2015-09-03 DIAGNOSIS — E559 Vitamin D deficiency, unspecified: Secondary | ICD-10-CM | POA: Diagnosis not present

## 2015-09-03 DIAGNOSIS — M545 Low back pain: Secondary | ICD-10-CM | POA: Diagnosis not present

## 2015-09-03 DIAGNOSIS — M961 Postlaminectomy syndrome, not elsewhere classified: Secondary | ICD-10-CM | POA: Diagnosis not present

## 2015-09-03 DIAGNOSIS — I10 Essential (primary) hypertension: Secondary | ICD-10-CM | POA: Diagnosis not present

## 2015-09-03 DIAGNOSIS — M5416 Radiculopathy, lumbar region: Secondary | ICD-10-CM | POA: Diagnosis not present

## 2015-09-03 HISTORY — DX: Unspecified hemorrhoids: K64.9

## 2015-09-03 HISTORY — DX: Anesthesia of skin: R20.0

## 2015-09-03 HISTORY — DX: Anesthesia of skin: R20.2

## 2015-09-03 LAB — BASIC METABOLIC PANEL
Anion gap: 12 (ref 5–15)
BUN: 20 mg/dL (ref 6–20)
CO2: 26 mmol/L (ref 22–32)
Calcium: 10.2 mg/dL (ref 8.9–10.3)
Chloride: 102 mmol/L (ref 101–111)
Creatinine, Ser: 0.84 mg/dL (ref 0.44–1.00)
GFR calc Af Amer: >60 mL/min (ref >60)
GFR calc non Af Amer: >60 mL/min (ref >60)
Glucose, Bld: 125 mg/dL — ABNORMAL HIGH (ref 65–99)
Potassium: 4.4 mmol/L (ref 3.5–5.1)
Sodium: 140 mmol/L (ref 135–145)

## 2015-09-03 LAB — CBC
HCT: 41.6 % (ref 36.0–46.0)
Hemoglobin: 13.6 g/dL (ref 12.0–15.0)
MCH: 28.5 pg (ref 26.0–34.0)
MCHC: 32.7 g/dL (ref 30.0–36.0)
MCV: 87 fL (ref 78.0–100.0)
Platelets: 338 10*3/uL (ref 150–400)
RBC: 4.78 MIL/uL (ref 3.87–5.11)
RDW: 12.4 % (ref 11.5–15.5)
WBC: 12.8 10*3/uL — ABNORMAL HIGH (ref 4.0–10.5)

## 2015-09-03 LAB — GLUCOSE, CAPILLARY: Glucose-Capillary: 191 mg/dL — ABNORMAL HIGH (ref 65–99)

## 2015-09-03 LAB — SURGICAL PCR SCREEN
MRSA, PCR: NEGATIVE
Staphylococcus aureus: NEGATIVE

## 2015-09-03 NOTE — Progress Notes (Signed)
PCP= Dr. Chana Bode Cardiologist - denies  EKG- 09/03/15 CXR- denies  Echo- 2008 Stress test - denies Cardiac Cath - 2008 in [place]; pt. Reports 0 stents  Patient denies chest pain and shortness of breath at PAT appointment.

## 2015-09-03 NOTE — Pre-Procedure Instructions (Addendum)
Robyn Hill  09/03/2015      [facility] [place], [place] [id number] [place] [id number] Phone: [phone] Fax: [phone]  [facility] [place], [place] AT [place] [address] Phone: [phone] Fax: [phone]    Your procedure is scheduled on Friday, November 18th, 2016.  Report to [facility] Admitting at 5:30 A.M.  Call this number if you have problems the morning of surgery:  [phone]   Remember:  Do not eat food or drink liquids after midnight.   Take these medicines the morning of surgery with A SIP OF WATER: Dexlansoprazole (Dexilant), Dorzolamide-timolol (Cosopt) eye drop, Duloxetine (Cymbalta), Hydrocodone-acetaminophen (Norco) if needed, Latanoprost (Xalatan) eye drop.    What do I do about my diabetes medications?   Do not take oral diabetes medicines (pills) the morning of surgery.  THE NIGHT BEFORE SURGERY, take 4 units of Toujeo Insulin.   Stop taking: Celebrex, Aspirin, NSAIDS, Aleve, Naproxen, Ibuprofen, Advil, Motrin, BC's, Goody's, fish oil, all herbal medications, and all vitamins.     Do not wear jewelry, make-up or nail polish.  Do not wear lotions, powders, or perfumes.  You may NOT wear deodorant.  Do not shave 48 hours prior to surgery.    Do not bring valuables to the hospital.  [facility] is not responsible for any belongings or valuables.  Contacts, dentures or bridgework may not be worn into surgery.  Leave your suitcase in the car.  After surgery it may be brought to your room.  For patients admitted to the hospital, discharge time will be determined by your treatment team.  Patients discharged the day of surgery will not be allowed to drive home.   Special instructions:  See attached.   Please read over the following fact sheets that you were given. Pain Booklet, Coughing and Deep  Breathing, MRSA Information and Surgical Site Infection Prevention     How to Manage Your Diabetes Before Surgery   Why is it important to control my blood sugar before and after surgery?   Improving blood sugar levels before and after surgery helps healing and can limit problems.  A way of improving blood sugar control is eating a healthy diet by:  - Eating less sugar and carbohydrates  - Increasing activity/exercise  - Talk with your doctor about reaching your blood sugar goals  High blood sugars (greater than 180 mg/dL) can raise your risk of infections and slow down your recovery so you will need to focus on controlling your diabetes during the weeks before surgery.  Make sure that the doctor who takes care of your diabetes knows about your planned surgery including the date and location.  How do I manage my blood sugars before surgery?   Check your blood sugar at least 4 times a day, 2 days before surgery to make sure that they are not too high or low.   Check your blood sugar the morning of your surgery when you wake up and every 2 hours until you get to the Short-Stay unit.  If your blood sugar is less than 70 mg/dL, you will need to treat for low blood sugar by:  Treat a low blood sugar (less than 70 mg/dL) with 1/2 cup of clear juice (cranberry or apple), 4 glucose tablets, OR glucose gel.  Recheck blood sugar in 15 minutes after treatment (  to make sure it is greater than 70 mg/dL).  If blood sugar is not greater than 70 mg/dL on re-check, call [phone] for further instructions.   Report your blood sugar to the Short-Stay nurse when you get to Short-Stay.  References:  [facility], 2007 "How to Manage your Diabetes Before and After Surgery".

## 2015-09-05 MED ORDER — CEFAZOLIN SODIUM-DEXTROSE 2-3 GM-% IV SOLR
2.0000 g | INTRAVENOUS | Status: DC
  Filled 2015-09-05: qty 50

## 2015-09-05 NOTE — H&P (Signed)
Robyn Hill is an 69 y.o. female.   Chief Complaint: back pain with radiation into the lower extremities HPI: patient was referred to me with a history of 2 lumbar surgeries, resulting in L3 to S1 fusion.  She unfortunately continues to have back pain, radiation into the lower extremity is left worse than right.  She failed other interventional and medication management approaches, and from a medical and psychological standpoint was found to be a good candidate for spinal cord stimulator therapy.  She underwent a trial in early October of this year, that gave her 80-90% relief of her symptoms.  She was able to almost completely eliminate medication, and her functionality increased substantially.  She now presents for permanent implantation of a SNS  Past Medical History  Diagnosis Date  . Hypertension   . Hyperlipidemia   . Glaucoma   . Urinary incontinence   . Chronic back pain   . Osteoarthritis   . Edema   . Female bladder prolapse   . Allergy   . GERD (gastroesophageal reflux disease)   . Diverticulosis   . Chronic diarrhea     Colestipol therapy  . Adrenal mass, right ([place])     01/2010, MRI abdomen  . Blood transfusion 1970  . Vitamin D deficiency   . Rheumatic fever     age 1  . Diverticulitis   . Fatty liver   . H/O hiatal hernia   . Blind right eye     since childhood  . Asthma     "as a teenager"  . Type II diabetes mellitus ([place])   . Kidney stones   . History of UTI     remote past  . H/O mammogram 07/2012    normal  . H/O bone density study 11/29/2014    normal study although mild decreased in density from prior study  . History of uterine cancer     s/p hysterectomy  . TMJ (dislocation of temporomandibular joint)     Hx: of  . Neuromuscular disorder ([place])     diabetic neuropahthy  . Hypothyroidism     hx of   . Migraine     hx of migraines in past   . Anemia   . Tinea pedis     bilateral feet   . Adrenal mass, right ([place])     being followed per  patient  . Numbness and tingling of both legs   . Hemorrhoids   . Wears glasses   . Vision loss of left eye     Past Surgical History  Procedure Laterality Date  . Tonsillectomy and adenoidectomy  1952  . Minor laceration repair      right eye  . Cardiac catheterization  11/2006  . Liver biopsy  04/1999  . Edsi for back pain  last 01/2009    multiple x 5  . Steroid injections of hip  last 02/2009    multiple x 4  . Eye surgery      Bil Blind R eye  . Posterior fusion lumbar spine  04/05/12  . Breast biopsy  08/1997    right  . Kidney stone surgery  1979  . Cholecystectomy    . Trabeculectomy  1996    bilaterally; "for glaucoma"  . Dilation and curettage of uterus  1980  . Back surgery    . Colonoscopy  10/2008    polyps  . Abdominal hysterectomy  1980    no further pap smears needed  .  Upper gi endoscopy  05/21/14    food, retained in the body of the stomach  . Cystoscopy N/A 01/29/2015    Procedure: CYSTOSCOPY;  Surgeon: Bjorn Loser, MD;  Location: [facility];  Service: Urology;  Laterality: N/A;  . Anterior and posterior repair N/A 01/29/2015    Procedure: CYSTOCELE AND RECTOCELE ;  Surgeon: Bjorn Loser, MD;  Location: [facility];  Service: Urology;  Laterality: N/A;  . Vaginal prolapse repair N/A 01/29/2015    Procedure:  VAULT PROLAPSE WITH GRAFT ;  Surgeon: Bjorn Loser, MD;  Location: [facility];  Service: Urology;  Laterality: N/A;    Family History  Problem Relation Age of Onset  . Anesthesia problems Neg Hx   . Cancer - Colon Brother   . Heart disease Brother   . Hypertension Other   . Diabetes Other   . Cancer Mother     aplastic anemia  . Cancer - Colon Brother     colon ca   Social History:  reports that she has never smoked. She has never used smokeless tobacco. She reports that she drinks alcohol. She reports that she does not use illicit drugs.  Allergies:  Allergies  Allergen Reactions  . Sunflowerseed Oil Hives  . Kiwi Extract Hives  . Invokana  [Canagliflozin] Other (See Comments)    UTI  . Oxycodone Other (See Comments)    Extended release only makes her nauseated.  Can tolerate the immediate release    Medications Prior to Admission  Medication Sig Dispense Refill  . aspirin EC 81 MG tablet Take 1 tablet (81 mg total) by mouth daily. 90 tablet 3  . atorvastatin (LIPITOR) 40 MG tablet Take 1 tablet (40 mg total) by mouth daily. (Patient taking differently: Take 40 mg by mouth daily. Taking at bedtime) 90 tablet 3  . celecoxib (CELEBREX) 200 MG capsule Take 1 capsule (200 mg total) by mouth 2 (two) times daily. 60 capsule 5  . cholecalciferol (VITAMIN D) 1000 UNITS tablet Take 1,000 Units by mouth every morning.     Marland Kitchen dexlansoprazole (DEXILANT) 60 MG capsule Take 1 capsule (60 mg total) by mouth 2 (two) times daily. 180 capsule 3  . diphenhydrAMINE-zinc acetate (BENADRYL) cream Apply 1 application topically as needed for itching.    . dorzolamide-timolol (COSOPT) 22.3-6.8 MG/ML ophthalmic solution Place 1 drop into both eyes 2 (two) times daily.    . DULoxetine (CYMBALTA) 60 MG capsule Take 1 capsule (60 mg total) by mouth daily. 90 capsule 0  . enalapril (VASOTEC) 20 MG tablet Take 20 mg by mouth daily.    . Exenatide ER (BYDUREON) 2 MG PEN Inject 2 mg into the skin once a week. 4 each 11  . ferrous gluconate (FERGON) 324 MG tablet Take 324 mg by mouth daily with breakfast.    . hydrochlorothiazide (HYDRODIURIL) 25 MG tablet Take 1 tablet (25 mg total) by mouth daily. 90 tablet 1  . HYDROcodone-acetaminophen (NORCO) 10-325 MG per tablet Take 1-2 tablets by mouth every 4 (four) hours as needed for moderate pain or severe pain.     . Insulin Glargine (TOUJEO SOLOSTAR) 300 UNIT/ML SOPN Inject 5 Units into the skin at bedtime. 1.5 mL 5  . Insulin Pen Needle (PEN NEEDLES) 31G X 6 MM MISC Use these for Toujeo pen. 100 each 0  . latanoprost (XALATAN) 0.005 % ophthalmic solution Place 1 drop into the right eye at bedtime.    . metFORMIN  (GLUCOPHAGE) 1000 MG tablet Take 1 tablet (1,000 mg  total) by mouth 2 (two) times daily with a meal. 180 tablet 3  . mupirocin ointment (BACTROBAN) 2 % Apply 1 application topically 2 (two) times daily. As needed for infection on arms or legs    . nortriptyline (PAMELOR) 10 MG capsule TAKE 1 CAPSULE BY MOUTH AT BEDTIME (Patient taking differently: TAKE 20 MG BY MOUTH AT BEDTIME) 30 capsule 0  . triamcinolone cream (KENALOG) 0.1 % Apply 1 application topically as needed (for skin irritation).    Marland Kitchen diclofenac sodium (VOLTAREN) 1 % GEL Apply 2 g topically 4 (four) times daily. (Patient not taking: Reported on 08/29/2015) 100 g 1  . DULoxetine (CYMBALTA) 60 MG capsule Take 1 capsule (60 mg total) by mouth 2 (two) times daily. (Patient not taking: Reported on 08/29/2015) 180 capsule 3  . glucose blood test strip Pt tests once a day (Patient not taking: Reported on 08/29/2015) 100 each 8  . terbinafine (LAMISIL AT) 1 % cream Apply 1 application topically 2 (two) times daily. (Patient not taking: Reported on 08/06/2015) 30 g 0    Results for orders placed or performed during the hospital encounter of 09/06/15 (from the past 48 hour(s))  Glucose, capillary     Status: Abnormal   Collection Time: 09/06/15  6:24 AM  Result Value Ref Range   Glucose-Capillary 141 (H) 65 - 99 mg/dL   No results found.  Review of Systems  Constitutional: Negative.   HENT: Negative.   Eyes: Negative.   Respiratory: Negative.   Cardiovascular: Negative.   Gastrointestinal: Negative.   Genitourinary: Negative.   Musculoskeletal: Positive for back pain. Negative for myalgias, falls and neck pain.  Skin: Negative.   Neurological: Negative.   Endo/Heme/Allergies: Negative.   Psychiatric/Behavioral: Negative.     Blood pressure 152/57, pulse 73, temperature 98.2 F (36.8 C), temperature source Oral, resp. rate 20, weight 95.709 kg (211 lb), SpO2 97 %. Physical Exam  Constitutional: She is oriented to person, place,  and time. She appears well-developed and well-nourished.  HENT:  Head: Normocephalic and atraumatic.  Eyes: Conjunctivae are normal. Pupils are equal, round, and reactive to light.  Neck: Normal range of motion.  Cardiovascular: Normal rate and regular rhythm.   Musculoskeletal: Normal range of motion.  Neurological: She is alert and oriented to person, place, and time.  Skin: Skin is warm and dry.  Psychiatric: She has a normal mood and affect. Her behavior is normal. Judgment and thought content normal.     Assessment/Plan A) 1) chronic pain; 2) lumbago; 3) lumbar radiculopathy; 4) lumbar post-laminectomy syndrome P: permanent SCS, [employer]  Bonna Gains 09/06/2015, 7:27 AM

## 2015-09-06 ENCOUNTER — Ambulatory Visit (HOSPITAL_COMMUNITY): Payer: Medicare Other | Admitting: Certified Registered"

## 2015-09-06 ENCOUNTER — Encounter (HOSPITAL_COMMUNITY): Payer: Self-pay | Admitting: *Deleted

## 2015-09-06 ENCOUNTER — Encounter (HOSPITAL_COMMUNITY)
Admission: RE | Disposition: A | Discharge status: Home / Self Care | Payer: Self-pay | Source: Ambulatory Visit | Attending: Anesthesiology

## 2015-09-06 ENCOUNTER — Ambulatory Visit (HOSPITAL_COMMUNITY): Payer: Medicare Other

## 2015-09-06 ENCOUNTER — Ambulatory Visit (HOSPITAL_COMMUNITY)
Admission: RE | Admit: 2015-09-06 | Discharge: 2015-09-06 | Disposition: A | Discharge status: Home / Self Care | Payer: Medicare Other | Source: Ambulatory Visit | Attending: Anesthesiology | Admitting: Anesthesiology

## 2015-09-06 DIAGNOSIS — E785 Hyperlipidemia, unspecified: Secondary | ICD-10-CM | POA: Insufficient documentation

## 2015-09-06 DIAGNOSIS — M961 Postlaminectomy syndrome, not elsewhere classified: Secondary | ICD-10-CM | POA: Insufficient documentation

## 2015-09-06 DIAGNOSIS — E559 Vitamin D deficiency, unspecified: Secondary | ICD-10-CM | POA: Insufficient documentation

## 2015-09-06 DIAGNOSIS — Z79899 Other long term (current) drug therapy: Secondary | ICD-10-CM | POA: Insufficient documentation

## 2015-09-06 DIAGNOSIS — M1990 Unspecified osteoarthritis, unspecified site: Secondary | ICD-10-CM | POA: Insufficient documentation

## 2015-09-06 DIAGNOSIS — M5416 Radiculopathy, lumbar region: Secondary | ICD-10-CM | POA: Insufficient documentation

## 2015-09-06 DIAGNOSIS — E1140 Type 2 diabetes mellitus with diabetic neuropathy, unspecified: Secondary | ICD-10-CM | POA: Insufficient documentation

## 2015-09-06 DIAGNOSIS — Z791 Long term (current) use of non-steroidal anti-inflammatories (NSAID): Secondary | ICD-10-CM | POA: Insufficient documentation

## 2015-09-06 DIAGNOSIS — Z794 Long term (current) use of insulin: Secondary | ICD-10-CM | POA: Insufficient documentation

## 2015-09-06 DIAGNOSIS — E039 Hypothyroidism, unspecified: Secondary | ICD-10-CM | POA: Insufficient documentation

## 2015-09-06 DIAGNOSIS — E1151 Type 2 diabetes mellitus with diabetic peripheral angiopathy without gangrene: Secondary | ICD-10-CM | POA: Diagnosis not present

## 2015-09-06 DIAGNOSIS — M545 Low back pain: Secondary | ICD-10-CM | POA: Diagnosis not present

## 2015-09-06 DIAGNOSIS — M4316 Spondylolisthesis, lumbar region: Secondary | ICD-10-CM | POA: Diagnosis not present

## 2015-09-06 DIAGNOSIS — G8929 Other chronic pain: Secondary | ICD-10-CM | POA: Diagnosis not present

## 2015-09-06 DIAGNOSIS — I10 Essential (primary) hypertension: Secondary | ICD-10-CM | POA: Diagnosis not present

## 2015-09-06 DIAGNOSIS — Z981 Arthrodesis status: Secondary | ICD-10-CM | POA: Insufficient documentation

## 2015-09-06 DIAGNOSIS — Z7982 Long term (current) use of aspirin: Secondary | ICD-10-CM | POA: Insufficient documentation

## 2015-09-06 DIAGNOSIS — K219 Gastro-esophageal reflux disease without esophagitis: Secondary | ICD-10-CM | POA: Insufficient documentation

## 2015-09-06 HISTORY — PX: SPINAL CORD STIMULATOR INSERTION: SHX5378

## 2015-09-06 LAB — GLUCOSE, CAPILLARY
Glucose-Capillary: 141 mg/dL — ABNORMAL HIGH (ref 65–99)
Glucose-Capillary: 138 mg/dL — ABNORMAL HIGH (ref 65–99)

## 2015-09-06 SURGERY — INSERTION, SPINAL CORD STIMULATOR, LUMBAR
Anesthesia: Monitor Anesthesia Care | Site: Back

## 2015-09-06 MED ORDER — BUPIVACAINE-EPINEPHRINE (PF) 0.5% -1:200000 IJ SOLN
INTRAMUSCULAR | Status: DC | PRN
  Administered 2015-09-06: 30 mL via PERINEURAL

## 2015-09-06 MED ORDER — PROPOFOL 500 MG/50ML IV EMUL
INTRAVENOUS | Status: DC | PRN
  Administered 2015-09-06: 100 ug/kg/min via INTRAVENOUS

## 2015-09-06 MED ORDER — FENTANYL CITRATE (PF) 250 MCG/5ML IJ SOLN
INTRAMUSCULAR | Status: DC | PRN
  Administered 2015-09-06 (×3): 50 ug via INTRAVENOUS
  Administered 2015-09-06 (×2): 25 ug via INTRAVENOUS
  Administered 2015-09-06: 50 ug via INTRAVENOUS

## 2015-09-06 MED ORDER — LIDOCAINE HCL (CARDIAC) 20 MG/ML IV SOLN
INTRAVENOUS | Status: DC | PRN
  Administered 2015-09-06: 20 mg via INTRAVENOUS

## 2015-09-06 MED ORDER — FENTANYL CITRATE (PF) 250 MCG/5ML IJ SOLN
INTRAMUSCULAR | Status: AC
  Filled 2015-09-06: qty 5

## 2015-09-06 MED ORDER — PROMETHAZINE HCL 25 MG/ML IJ SOLN: 6.2500 mg | INTRAMUSCULAR | Status: DC | PRN

## 2015-09-06 MED ORDER — SODIUM CHLORIDE 0.9 % IR SOLN
Status: DC | PRN
  Administered 2015-09-06: 500 mL

## 2015-09-06 MED ORDER — MIDAZOLAM HCL 2 MG/2ML IJ SOLN
INTRAMUSCULAR | Status: AC
  Filled 2015-09-06: qty 2

## 2015-09-06 MED ORDER — MEPERIDINE HCL 25 MG/ML IJ SOLN: 6.2500 mg | INTRAMUSCULAR | Status: DC | PRN

## 2015-09-06 MED ORDER — HYDROMORPHONE HCL 1 MG/ML IJ SOLN
INTRAMUSCULAR | Status: AC
  Filled 2015-09-06: qty 1

## 2015-09-06 MED ORDER — LACTATED RINGERS IV SOLN
INTRAVENOUS | Status: DC | PRN
  Administered 2015-09-06: 07:00:00 via INTRAVENOUS

## 2015-09-06 MED ORDER — PROPOFOL 10 MG/ML IV BOLUS
INTRAVENOUS | Status: AC
  Filled 2015-09-06: qty 20

## 2015-09-06 MED ORDER — MIDAZOLAM HCL 5 MG/5ML IJ SOLN
INTRAMUSCULAR | Status: DC | PRN
  Administered 2015-09-06 (×4): 0.5 mg via INTRAVENOUS

## 2015-09-06 MED ORDER — 0.9 % SODIUM CHLORIDE (POUR BTL) OPTIME
TOPICAL | Status: DC | PRN
  Administered 2015-09-06: 1000 mL

## 2015-09-06 MED ORDER — HYDROMORPHONE HCL 1 MG/ML IJ SOLN
0.2500 mg | INTRAMUSCULAR | Status: DC | PRN
  Administered 2015-09-06 (×4): 0.5 mg via INTRAVENOUS

## 2015-09-06 MED ORDER — HYDROMORPHONE HCL 1 MG/ML IJ SOLN
INTRAMUSCULAR | Status: DC
Start: 2015-09-06 — End: 2015-09-06
  Filled 2015-09-06: qty 1

## 2015-09-06 MED ORDER — PROPOFOL 10 MG/ML IV BOLUS
INTRAVENOUS | Status: DC | PRN
  Administered 2015-09-06: 10 mg via INTRAVENOUS
  Administered 2015-09-06: 20 mg via INTRAVENOUS
  Administered 2015-09-06 (×2): 10 mg via INTRAVENOUS

## 2015-09-06 SURGICAL SUPPLY — 59 items
ANCHOR CLIK X NEURO (Stimulator) ×2 IMPLANT
BAG DECANTER FOR FLEXI CONT (MISCELLANEOUS) ×2 IMPLANT
BENZOIN TINCTURE PRP APPL 2/3 (GAUZE/BANDAGES/DRESSINGS) IMPLANT
BINDER ABDOMINAL 12 ML 46-62 (SOFTGOODS) ×2 IMPLANT
BLADE CLIPPER SURG (BLADE) IMPLANT
CABLE/EXTENSION OR 1X16 61 (CABLE) ×4 IMPLANT
CHLORAPREP W/TINT 26ML (MISCELLANEOUS) ×2 IMPLANT
CLIP TI WIDE RED SMALL 6 (CLIP) IMPLANT
DRAPE C-ARM 42X72 X-RAY (DRAPES) ×2 IMPLANT
DRAPE C-ARMOR (DRAPES) ×2 IMPLANT
DRAPE LAPAROTOMY 100X72X124 (DRAPES) ×2 IMPLANT
DRAPE POUCH INSTRU U-SHP 10X18 (DRAPES) ×2 IMPLANT
DRAPE SURG 17X23 STRL (DRAPES) ×2 IMPLANT
DRSG OPSITE POSTOP 3X4 (GAUZE/BANDAGES/DRESSINGS) IMPLANT
DRSG OPSITE POSTOP 4X6 (GAUZE/BANDAGES/DRESSINGS) ×2 IMPLANT
ELECT REM PT RETURN 9FT ADLT (ELECTROSURGICAL) ×2
ELECTRODE REM PT RTRN 9FT ADLT (ELECTROSURGICAL) ×1 IMPLANT
GAUZE SPONGE 4X4 16PLY XRAY LF (GAUZE/BANDAGES/DRESSINGS) ×2 IMPLANT
GLOVE BIOGEL PI IND STRL 7.5 (GLOVE) ×1 IMPLANT
GLOVE BIOGEL PI INDICATOR 7.5 (GLOVE) ×1
GLOVE ECLIPSE 7.5 STRL STRAW (GLOVE) ×2 IMPLANT
GLOVE EXAM NITRILE LRG STRL (GLOVE) IMPLANT
GLOVE EXAM NITRILE MD LF STRL (GLOVE) IMPLANT
GLOVE EXAM NITRILE XL STR (GLOVE) IMPLANT
GLOVE EXAM NITRILE XS STR PU (GLOVE) IMPLANT
GOWN STRL REUS W/ TWL LRG LVL3 (GOWN DISPOSABLE) IMPLANT
GOWN STRL REUS W/ TWL XL LVL3 (GOWN DISPOSABLE) IMPLANT
GOWN STRL REUS W/TWL 2XL LVL3 (GOWN DISPOSABLE) IMPLANT
GOWN STRL REUS W/TWL LRG LVL3 (GOWN DISPOSABLE)
GOWN STRL REUS W/TWL XL LVL3 (GOWN DISPOSABLE)
KIT BASIN OR (CUSTOM PROCEDURE TRAY) ×2 IMPLANT
KIT CHARGING (KITS) ×1
KIT CHARGING PRECISION NEURO (KITS) ×1 IMPLANT
KIT REMOTE CONTROL PRECISION (KITS) ×2 IMPLANT
KIT ROOM TURNOVER OR (KITS) ×2 IMPLANT
KIT SPLITTER 30CM 2X8 (Stimulator) ×4 IMPLANT
LEAD KIT CONTACT INFINION 16 (Stimulator) ×4 IMPLANT
LIQUID BAND (GAUZE/BANDAGES/DRESSINGS) ×2 IMPLANT
NEEDLE 18GX1X1/2 (RX/OR ONLY) (NEEDLE) IMPLANT
NEEDLE HYPO 25X1 1.5 SAFETY (NEEDLE) ×2 IMPLANT
NS IRRIG 1000ML POUR BTL (IV SOLUTION) ×2 IMPLANT
PACK LAMINECTOMY NEURO (CUSTOM PROCEDURE TRAY) ×2 IMPLANT
PAD ARMBOARD 7.5X6 YLW CONV (MISCELLANEOUS) ×2 IMPLANT
SPONGE LAP 4X18 X RAY DECT (DISPOSABLE) ×2 IMPLANT
SPONGE SURGIFOAM ABS GEL SZ50 (HEMOSTASIS) IMPLANT
STAPLER SKIN PROX WIDE 3.9 (STAPLE) ×2 IMPLANT
STRIP CLOSURE SKIN 1/2X4 (GAUZE/BANDAGES/DRESSINGS) IMPLANT
SUT MNCRL AB 4-0 PS2 18 (SUTURE) IMPLANT
SUT SILK 0 (SUTURE) ×1
SUT SILK 0 MO-6 18XCR BRD 8 (SUTURE) ×1 IMPLANT
SUT SILK 0 TIES 10X30 (SUTURE) IMPLANT
SUT SILK 2 0 TIES 10X30 (SUTURE) IMPLANT
SUT VIC AB 2-0 CP2 18 (SUTURE) ×4 IMPLANT
SYR EPIDURAL 5ML GLASS (SYRINGE) ×2 IMPLANT
SYRINGE 10CC LL (SYRINGE) IMPLANT
TOWEL OR 17X24 6PK STRL BLUE (TOWEL DISPOSABLE) ×2 IMPLANT
TOWEL OR 17X26 10 PK STRL BLUE (TOWEL DISPOSABLE) ×2 IMPLANT
WATER STERILE IRR 1000ML POUR (IV SOLUTION) ×2 IMPLANT
YANKAUER SUCT BULB TIP NO VENT (SUCTIONS) ×2 IMPLANT

## 2015-09-06 NOTE — Transfer of Care (Signed)
Immediate Anesthesia Transfer of Care Note  Patient: Robyn Hill  Procedure(s) Performed: Procedure(s) with comments: LUMBAR SPINAL CORD STIMULATOR INSERTION (N/A) - Spinal Cord Stimulator placement  Patient Location: PACU  Anesthesia Type:MAC  Level of Consciousness: awake, alert  and oriented  Airway & Oxygen Therapy: Patient Spontanous Breathing and Patient connected to face mask oxygen  Post-op Assessment: Report given to RN, Post -op Vital signs reviewed and stable and Patient moving all extremities X 4  Post vital signs: Reviewed and stable  Last Vitals:  Filed Vitals:   09/06/15 0619  BP: 152/57  Pulse: 73  Temp: 36.8 C  Resp: 20    Complications: No apparent anesthesia complications

## 2015-09-06 NOTE — Op Note (Signed)
PREOP DX: 1) lumbago and lumbar radiculopathy 2) lumbar postlaminectomy syndrome 3) Chronic pain POSTOP DX: Same as preop PROCEDURES PERFORMED: placement of thoracolumbar SCS leads, Aborted SURGEON:Honesti Seaberg  ASSISTANT: none  ANESTHESIA: TIVA/MAC  FINDINGS: ?thickened ligament, adhesions EBL: >10 cc  DRAINS: none DESCRIPTION OF PROCEDURE:  After a discussion of risks, benefits and alternatives, informed consent was obtained. The patient was taken to the operative suite, positioned prone, SCD's placed, pressure points padded, and an adequate plane of anesthesia obtained by the anesthesia team. A timeout was taken to verify the appropriate patient, position, personnel, availability of materials, and appropriate antibiotic administration.  The patient's lumbar and thoracic spine were prepped with Chloraprep and draped into a sterile field in the usual fashion. Fluoroscopy was used to plan a right paramedian incision from L1 to to top of L3 (so as not to expose the patient's PLIF hardware), and the skin and subcutaneous tissues infiltrated with 0.5% bupivicaine 1:200K epinephrine. An incision made with a 10 blade and carried down to the dorsal lumbar fascia. A 14-gauge [employer] bevel tipped needle was introduced and advanced under intermittent fluoroscopic guidance until it was directed toward the T12-L1 interspace. Biplanar fluoroscopy as well as loss resistance technique was used to enter the epidural space. Despite good loss-of-resistance, I was unable to introduce the lead past the tip of the needle without it diverting toward the lateral aspects of the epidural space. The incision was then modestly extended in order to access the L1-2 interspace, and provide a more oblique angle, just encase steering was a potential problem. Again using biplanar fluoroscopy as well as loss resistance technique was able to enter the epidural space. The lead was seen to advance from the needle tip to the  level of T12-L1 but despite numerous attempts to steer the lead in a variety of directions, substantial resistance-- again likely related to scar tissue--was encountered. Decision was made to attempt access at the T11 T12 interspace, and were able to steer leads to the left paramedian and further left parasaggital areas of T8 and T9. Trials were done, but we were unable to accomplish adequate coverage of the patient's pain areas. Attempts at were then carried out at the midline and right paramedian approaches but the same problems of resistance at about T11 were encountered again. The decision was made at this point to abort the procedure and have the patient seen for consideration of a paddle lead placement The surgical wound was copiously irrigated with bacitracin-containing irrigant. The wound was closed in layers of 2-0 Vicryl interrupted sutures, and the skin closed with a staples. Sterile occlusive dressings were placed. The patient was then transferred to a stretcher and taken to the recovery room in satisfactory condition. Needle and sponge counts were correct x2 at the end of the case.  COMPLICATIONS: none  CONDITION: stable throughout the case and to the PACU  DISPOSITION: discharged to home in stable condition. Will have follow up arranged with Dr. Vertell Limber, with whom the patient is familiar, for implantation via laminotomy. Will see the patient for postop check in about 2 weeks. He and his spouse know to call me with problems, questions or concerns

## 2015-09-06 NOTE — Anesthesia Preprocedure Evaluation (Signed)
Anesthesia Evaluation  Patient identified by MRN, date of birth, ID band Patient awake    Reviewed: Allergy & Precautions, H&P , NPO status , Patient's Chart, lab work & pertinent test results  Airway Mallampati: II  TM Distance: >3 FB Neck ROM: full    Dental no notable dental hx. (+) Teeth Intact, Dental Advisory Given,    Pulmonary asthma ,  TMJ problems   Pulmonary exam normal breath sounds clear to auscultation       Cardiovascular Exercise Tolerance: Good hypertension, Pt. on medications Normal cardiovascular exam Rhythm:Regular Rate:Normal  23-Oct-2013 10:33:39 [facility] ROUTINE RECORD Normal sinus rhythm Normal ECG   Neuro/Psych  Headaches, Glaucoma. Chronic back pain negative neurological ROS  negative psych ROS   GI/Hepatic negative GI ROS, Neg liver ROS, hiatal hernia, GERD  Medicated and Controlled,  Endo/Other  diabetes, Poorly Controlled, Type 2, Oral Hypoglycemic Agents, Insulin DependentHypothyroidism Adrenal mass  Renal/GU negative Renal ROS  negative genitourinary   Musculoskeletal   Abdominal   Peds  Hematology negative hematology ROS (+)   Anesthesia Other Findings Blind OD w/ limited vision OS  Reproductive/Obstetrics negative OB ROS                             Anesthesia Physical  Anesthesia Plan  ASA: III  Anesthesia Plan: MAC   Post-op Pain Management:    Induction: Intravenous  Airway Management Planned:   Additional Equipment:   Intra-op Plan:   Post-operative Plan:   Informed Consent: I have reviewed the patients History and Physical, chart, labs and discussed the procedure including the risks, benefits and alternatives for the proposed anesthesia with the patient or authorized representative who has indicated his/her understanding and acceptance.   Dental Advisory Given  Plan Discussed with: CRNA  Anesthesia Plan  Comments:         Anesthesia Quick Evaluation

## 2015-09-06 NOTE — Discharge Instructions (Signed)
Dr. Maryjean Ka Post-Op Orders   Ice Pack - 20 minutes on (in a pillow case), and 20 minutes off. Wear the ice pack UNDER the binder.  Follow up in office, they will call you for an appointment in 10 days to 2 weeks.  Increase activity gradually.    Advance diet slowly as tolerated.  Dressing care:  Keep dressing dry for 3 days, and on Post-op day 4, may shower.  Call for fever, drainage, and redness.  No swimming or bathing in a bathtub (do not get into standing water).'

## 2015-09-06 NOTE — Anesthesia Postprocedure Evaluation (Signed)
Anesthesia Post Note  Patient: Robyn Hill  Procedure(s) Performed: Procedure(s) (LRB): LUMBAR SPINAL CORD STIMULATOR INSERTION (N/A)  Anesthesia type: MAC  Patient location: PACU  Post pain: Pain level controlled  Post assessment: Post-op Vital signs reviewed  Last Vitals: BP 150/72 mmHg  Pulse 85  Temp(Src) 36.1 C (Oral)  Resp 20  Wt 211 lb (95.709 kg)  SpO2 99%  Post vital signs: Reviewed  Level of consciousness: awake  Complications: No apparent anesthesia complications

## 2015-09-09 ENCOUNTER — Encounter (HOSPITAL_COMMUNITY): Payer: Self-pay | Admitting: Anesthesiology

## 2015-09-10 ENCOUNTER — Other Ambulatory Visit: Payer: Self-pay | Admitting: Medical

## 2015-09-10 NOTE — Telephone Encounter (Signed)
Is this ok to refill?  

## 2015-09-17 ENCOUNTER — Other Ambulatory Visit: Payer: Self-pay | Admitting: Neurosurgery

## 2015-10-04 DIAGNOSIS — H401133 Primary open-angle glaucoma, bilateral, severe stage: Secondary | ICD-10-CM | POA: Diagnosis not present

## 2015-10-10 ENCOUNTER — Telehealth: Payer: Self-pay | Admitting: Medical

## 2015-10-10 NOTE — Telephone Encounter (Signed)
Pt aware.

## 2015-10-10 NOTE — Telephone Encounter (Signed)
LMTCB

## 2015-10-10 NOTE — Telephone Encounter (Signed)
Mammogram normal/no worrisome findings. 

## 2015-10-15 ENCOUNTER — Other Ambulatory Visit (HOSPITAL_COMMUNITY): Payer: Self-pay | Admitting: *Deleted

## 2015-10-15 NOTE — Pre-Procedure Instructions (Signed)
Robyn Hill  10/15/2015     Your procedure is scheduled on Tuesday, October 22, 2015 at 7:30 AM.   Report to [facility] Entrance "A" Admitting Office at 5:30 AM.   Call this number if you have problems the morning of surgery: [phone]   Any questions prior to day of surgery, please call [phone] between 8 & 4 PM.   Remember:  Do not eat food or drink liquids after midnight Monday, 10/21/15.  Take these medicines the morning of surgery with A SIP OF WATER: Dexlansoprazole (Dexilant), Duloxetine (Cymbalta), Hydrocodone - if needed   Stop Aspirin and NSAIDS (Celebrex, Ibuprofen, Aleve, etc.) as of today.  How to Manage Your Diabetes Before Surgery   Why is it important to control my blood sugar before and after surgery?   Improving blood sugar levels before and after surgery helps healing and can limit problems.  A way of improving blood sugar control is eating a healthy diet by:  - Eating less sugar and carbohydrates  - Increasing activity/exercise  - Talk with your doctor about reaching your blood sugar goals  High blood sugars (greater than 180 mg/dL) can raise your risk of infections and slow down your recovery so you will need to focus on controlling your diabetes during the weeks before surgery.  Make sure that the doctor who takes care of your diabetes knows about your planned surgery including the date and location.  How do I manage my blood sugars before surgery?   Check your blood sugar at least 4 times a day, 2 days before surgery to make sure that they are not too high or low.   Check your blood sugar the morning of your surgery when you wake up and every 2 hours until you get to the Short-Stay unit.   Treat a low blood sugar (less than 70 mg/dL) with 1/2 cup of clear juice (cranberry or apple), 4 glucose tablets, OR glucose gel.   Recheck blood sugar in 15 minutes after treatment (to make sure it is greater than 70 mg/dL).  If blood sugar  is not greater than 70 mg/dL on re-check, call [phone] for further instructions.    Report your blood sugar to the Short-Stay nurse when you get to Short-Stay.  References:  [facility], 2007 "How to Manage your Diabetes Before and After Surgery".  What do I do about my diabetes medications?   Do not take oral diabetes medicines (pills) the morning of surgery.  THE NIGHT BEFORE SURGERY, take 4 units of Toujeo Insulin.    THE MORNING OF SURGERY, take 2.5 units of Toujeo Insulin.    Do not take other diabetes injectables the day of surgery including Byetta, Victoza, Bydureon, and Trulicity.   Do not wear jewelry, make-up or nail polish.  Do not wear lotions, powders, or perfumes.  You may wear deodorant.  Do not shave 48 hours prior to surgery.    Do not bring valuables to the hospital.  [facility] is not responsible for any belongings or valuables.  Contacts, dentures or bridgework may not be worn into surgery.  Leave your suitcase in the car.  After surgery it may be brought to your room.  For patients admitted to the hospital, discharge time will be determined by your treatment team.  Special instructions:  [name] - Preparing for Surgery  Before surgery, you can play an important role.  Because skin is not sterile, your skin needs to  be as free of germs as possible.  You can reduce the number of germs on you skin by washing with CHG (chlorahexidine gluconate) soap before surgery.  CHG is an antiseptic cleaner which kills germs and bonds with the skin to continue killing germs even after washing.  Please DO NOT use if you have an allergy to CHG or antibacterial soaps.  If your skin becomes reddened/irritated stop using the CHG and inform your nurse when you arrive at Short Stay.  Do not shave (including legs and underarms) for at least 48 hours prior to the first CHG shower.  You may shave your face.  Please follow these instructions  carefully:   1.  Shower with CHG Soap the night before surgery and the                                morning of Surgery.  2.  If you choose to wash your hair, wash your hair first as usual with your       normal shampoo.  3.  After you shampoo, rinse your hair and body thoroughly to remove the                      Shampoo.  4.  Use CHG as you would any other liquid soap.  You can apply chg directly       to the skin and wash gently with scrungie or a clean washcloth.  5.  Apply the CHG Soap to your body ONLY FROM THE NECK DOWN.        Do not use on open wounds or open sores.  Avoid contact with your eyes, ears, mouth and genitals (private parts).  Wash genitals (private parts) with your normal soap.  6.  Wash thoroughly, paying special attention to the area where your surgery        will be performed.  7.  Thoroughly rinse your body with warm water from the neck down.  8.  DO NOT shower/wash with your normal soap after using and rinsing off       the CHG Soap.  9.  Pat yourself dry with a clean towel.            10.  Wear clean pajamas.            11.  Place clean sheets on your bed the night of your first shower and do not        sleep with pets.  Day of Surgery  Do not apply any lotions the morning of surgery.  Please wear clean clothes to the hospital.   Please read over the following fact sheets that you were given. Pain Booklet, Coughing and Deep Breathing, MRSA Information and Surgical Site Infection Prevention

## 2015-10-16 ENCOUNTER — Encounter (HOSPITAL_COMMUNITY)
Admission: RE | Admit: 2015-10-16 | Discharge: 2015-10-16 | Disposition: A | Discharge status: Home / Self Care | Payer: Medicare Other | Source: Ambulatory Visit | Attending: Neurosurgery | Admitting: Neurosurgery

## 2015-10-16 ENCOUNTER — Encounter (HOSPITAL_COMMUNITY): Payer: Self-pay

## 2015-10-16 DIAGNOSIS — Z01812 Encounter for preprocedural laboratory examination: Secondary | ICD-10-CM | POA: Insufficient documentation

## 2015-10-16 DIAGNOSIS — M5416 Radiculopathy, lumbar region: Secondary | ICD-10-CM | POA: Diagnosis not present

## 2015-10-16 DIAGNOSIS — Z79899 Other long term (current) drug therapy: Secondary | ICD-10-CM | POA: Diagnosis not present

## 2015-10-16 LAB — CBC
HCT: 42.7 % (ref 36.0–46.0)
Hemoglobin: 14.1 g/dL (ref 12.0–15.0)
MCH: 28.5 pg (ref 26.0–34.0)
MCHC: 33 g/dL (ref 30.0–36.0)
MCV: 86.3 fL (ref 78.0–100.0)
Platelets: 277 10*3/uL (ref 150–400)
RBC: 4.95 MIL/uL (ref 3.87–5.11)
RDW: 12.6 % (ref 11.5–15.5)
WBC: 12.2 10*3/uL — ABNORMAL HIGH (ref 4.0–10.5)

## 2015-10-16 LAB — BASIC METABOLIC PANEL
Anion gap: 12 (ref 5–15)
BUN: 19 mg/dL (ref 6–20)
CO2: 26 mmol/L (ref 22–32)
Calcium: 9.8 mg/dL (ref 8.9–10.3)
Chloride: 99 mmol/L — ABNORMAL LOW (ref 101–111)
Creatinine, Ser: 0.69 mg/dL (ref 0.44–1.00)
GFR calc Af Amer: >60 mL/min (ref >60)
GFR calc non Af Amer: >60 mL/min (ref >60)
Glucose, Bld: 151 mg/dL — ABNORMAL HIGH (ref 65–99)
Potassium: 4.6 mmol/L (ref 3.5–5.1)
Sodium: 137 mmol/L (ref 135–145)

## 2015-10-16 LAB — GLUCOSE, CAPILLARY: Glucose-Capillary: 156 mg/dL — ABNORMAL HIGH (ref 65–99)

## 2015-10-16 LAB — SURGICAL PCR SCREEN
MRSA, PCR: NEGATIVE
Staphylococcus aureus: NEGATIVE

## 2015-10-16 NOTE — Progress Notes (Addendum)
Pt denies cardiac history, chest pain or sob. Had surgery here in November. She states nothing has changed with her medical/surgical history. She states her last dose of Aspirin was 10/15/15 and she states she has also stopped Metformin yesterday (10/15/15) as well - she states she was instructed to do that with the surgery in November so she has done it for this surgery. I suggested that she call and check with Dr. Vertell Limber to make sure that he does want her stopping the Metformin. She voiced understanding.  Last A1C was 08/06/15. Will get one today. CBG was 156 today, pt had not eaten this am. She states her fasting blood sugars run between 119-130.

## 2015-10-17 LAB — HEMOGLOBIN A1C
Hgb A1c MFr Bld: 7.9 % — ABNORMAL HIGH (ref 4.8–5.6)
Mean Plasma Glucose: 180 mg/dL

## 2015-10-18 NOTE — Discharge Instructions (Signed)

## 2015-10-19 ENCOUNTER — Other Ambulatory Visit: Payer: Self-pay | Admitting: Medical

## 2015-10-20 NOTE — H&P (Signed)
Patient ID:   [id number] Patient: Robyn Hill  Date of Birth: 01-18-1946 Visit Type: Post Op Visit   Date: 09/18/2015 08:30 AM Provider: Bonna Gains PhD MD   This 70 year old female presents for back pain.  History of Present Illness: 1.  back pain  She returns to clinic today now approximately 2 weeks status post unsuccessful placement of her permanent spinal cord stimulator.  Despite our best attempts were unable to place the leads in an adequate location.  I and the patient spoke with Dr. Vertell Limber.  She is scheduled for paddle-type SCS placement on January 3.  She reports no problems with her incision.  She has no fevers chills or drainage from her incision site.       PAST MEDICAL HISTORY, SURGICAL HISTORY, FAMILY HISTORY, SOCIAL HISTORY AND REVIEW OF SYSTEMS I have reviewed the patient's past medical, surgical, family and social history as well as the comprehensive review of systems as included on the [facility] history form dated 07/23/2015, which I have signed.   MEDICATIONS(added, continued or stopped this visit): Started Medication Directions Instruction Stopped   atorvastatin 40 mg tablet take 1 tablet by oral route  every day     Bydureon 2 mg/0.65 mL subcutaneous pen injector inject 0.65 milliliter by subcutaneous route  every 7 days in the abdomen, thighs, or outer area of upper arm rotating injectionsites    06/19/2015 Celebrex 200 mg capsule take 1 capsule by oral route 2 times every day     Cymbalta 60 mg capsule,delayed release take 1 capsule by oral route 2 times every day     Dexilant 60 mg capsule, delayed release take 1 capsule by oral route 2 times every day for 8 weeks     DORZOLAMIDE HCL/TIMOLOL MALEAT instill 1 drop by ophthalmic route 2 times every day into affected eye(s)     enalapril maleate 20 mg tablet take 1 tablet by oral route  every day     ferrous gluconate 324 mg (36 mg iron) tablet Take 1 tablet daily with  breakfast     hydrochlorothiazide 25 mg tablet take 1 tablet by oral route  every day     metformin 1,000 mg tablet take 1 tablet by oral route 2 times every day with morning and evening meals     mupirocin 2 % topical ointment 1 application twice daily as needed for infection of sores on legs/arms    08/26/2015 Norco 10 mg-325 mg tablet take 1 tablet by oral route  every 4 hours as needed for pain  09/18/2015  09/18/2015 Norco 10 mg-325 mg tablet take 1 tablet by oral route  every 4 hours as needed for pain    06/19/2015 nortriptyline 10 mg capsule take 1 to 2 capsule by oral route  every day at bedtime     triamcinolone acetonide 1 application twice daily as needed for itching spots on arms/legs     Vitamin D3 1,000 unit tablet Take 1 tablet in the morning     Xalatan 0.005 % eye drops 1 drop into right eye at bedtime       ALLERGIES: Ingredient Reaction Medication Name Comment  ACETAMINOPHEN Nausea PERCOCET   OXYCODONE HCL Nausea PERCOCET    Reviewed, no changes.   REVIEW OF SYSTEMS System Neg/Pos Details  Constitutional Negative Chills, fatigue, fever, malaise, night sweats, weight gain and weight loss.  ENMT Negative Ear drainage, hearing loss, nasal drainage, otalgia, sinus pressure and sore throat.  Eyes Negative Eye discharge, eye pain and vision changes.  Respiratory Negative Chronic cough, cough, dyspnea, known TB exposure and wheezing.  Cardio Negative Chest pain, claudication, edema and irregular heartbeat/palpitations.  GI Negative Abdominal pain, blood in stool, change in stool pattern, constipation, decreased appetite, diarrhea, heartburn, nausea and vomiting.  GU Negative Dysuria, hematuria, polyuria, urinary frequency, urinary incontinence and urinary retention.  Endocrine Positive Polydipsia.  Endocrine Negative Cold intolerance, heat intolerance and polyphagia.  Neuro Positive Gait disturbance.  Neuro Negative Dizziness, extremity weakness, headache, memory  impairment, numbness in extremity, seizures and tremors.  Psych Negative Anxiety, depression and insomnia.  Integumentary Negative Brittle hair, brittle nails, change in shape/size of mole(s), hair loss, hirsutism, hives, pruritus, rash and skin lesion.  MS Positive Back pain.  MS Negative Joint pain, joint swelling, muscle weakness and neck pain.  Hema/Lymph Negative Easy bleeding, easy bruising and lymphadenopathy.  Allergic/Immuno Negative Contact allergy, environmental allergies, food allergies and seasonal allergies.  Reproductive Negative Breast discharge and breast lump(s).     Vitals Date Temp F BP Pulse Ht In Wt Lb BMI BSA Pain Score  09/18/2015  145/79 84 64 210.4 36.11  9/10     PHYSICAL EXAM General Level of Distress: no acute distress Overall Appearance: normal   Incision Incision:  clean, dry, intact  Cardiovascular Cardiac: normal  Respiratory Lungs: non-labored  Neurological Orientation: normal Recent and Remote Memory: normal Attention Span and Concentration:   normal Language: normal Fund of Knowledge: normal  Right Left Sensation: normal normal Upper Extremity Coordination: normal normal  Lower Extremity Coordination: normal normal  Motor Strength Lower extremity motor strength was tested in the clinically pertinent muscles.     Cranial Nerves II. Optic Nerve/Visual Fields: normal III. Oculomotor: normal IV. Trochlear: normal V. Trigeminal: normal VI. Abducens: normal VII. Facial: normal VIII. Acoustic/Vestibular: normal IX. Glossopharyngeal: normal X. Vagus: normal XI. Spinal Accessory: normal XII. Hypoglossal: normal      IMPRESSION Lumbar postlaminectomy syndrome, technical difficulties with SCS placement.  Assessment/Plan # Detail Type Description   1. Assessment Lumbar post-laminectomy syndrome (M96.1).         Staples were removed today, with benzoin and Steri-Strips placed and understood.  Her incision is healing  well.  She has follow-up scheduled with Dr. Vertell Limber.  I sent her Norco to her pharmacy for her today.  We'll see her back on an as-needed basis when she completes her surgical course with Dr. Vertell Limber    MEDICATIONS PRESCRIBED TODAY    Rx Quantity Refills  NORCO 10 mg-325 mg  120 0            Provider:  Bonna Gains PhD MD  09/18/2015 09:05 AM Dictation edited by: Bonna Gains    CC Providers: Jill Alexanders [facility] [address] [place],  [place]  [address]-   Wakisha Alberts MD [address] [place], [place] [address]              Electronically signed by Bonna Gains PhD MD on 09/18/2015 09:06 AM

## 2015-10-21 MED ORDER — CEFAZOLIN SODIUM-DEXTROSE 2-3 GM-% IV SOLR
2.0000 g | INTRAVENOUS | Status: AC
  Administered 2015-10-22: 2 g via INTRAVENOUS
  Filled 2015-10-21: qty 50

## 2015-10-22 ENCOUNTER — Encounter (HOSPITAL_COMMUNITY): Payer: Self-pay | Admitting: Certified Registered"

## 2015-10-22 ENCOUNTER — Observation Stay (HOSPITAL_COMMUNITY)
Admission: RE | Admit: 2015-10-22 | Discharge: 2015-10-23 | Disposition: A | Discharge status: Home / Self Care | Payer: Medicare Other | Source: Ambulatory Visit | Attending: Neurosurgery | Admitting: Neurosurgery

## 2015-10-22 ENCOUNTER — Ambulatory Visit (HOSPITAL_COMMUNITY): Payer: Medicare Other | Admitting: Anesthesiology

## 2015-10-22 ENCOUNTER — Ambulatory Visit (HOSPITAL_COMMUNITY): Payer: Medicare Other

## 2015-10-22 ENCOUNTER — Encounter (HOSPITAL_COMMUNITY)
Admission: RE | Disposition: A | Discharge status: Home / Self Care | Payer: Self-pay | Source: Ambulatory Visit | Attending: Neurosurgery

## 2015-10-22 DIAGNOSIS — D649 Anemia, unspecified: Secondary | ICD-10-CM | POA: Diagnosis not present

## 2015-10-22 DIAGNOSIS — M961 Postlaminectomy syndrome, not elsewhere classified: Secondary | ICD-10-CM | POA: Insufficient documentation

## 2015-10-22 DIAGNOSIS — E039 Hypothyroidism, unspecified: Secondary | ICD-10-CM | POA: Insufficient documentation

## 2015-10-22 DIAGNOSIS — I10 Essential (primary) hypertension: Secondary | ICD-10-CM | POA: Insufficient documentation

## 2015-10-22 DIAGNOSIS — Z7984 Long term (current) use of oral hypoglycemic drugs: Secondary | ICD-10-CM | POA: Diagnosis not present

## 2015-10-22 DIAGNOSIS — Z79899 Other long term (current) drug therapy: Secondary | ICD-10-CM | POA: Insufficient documentation

## 2015-10-22 DIAGNOSIS — K219 Gastro-esophageal reflux disease without esophagitis: Secondary | ICD-10-CM | POA: Diagnosis not present

## 2015-10-22 DIAGNOSIS — M5417 Radiculopathy, lumbosacral region: Secondary | ICD-10-CM | POA: Insufficient documentation

## 2015-10-22 DIAGNOSIS — H5411 Blindness, right eye, low vision left eye: Secondary | ICD-10-CM | POA: Insufficient documentation

## 2015-10-22 DIAGNOSIS — G8929 Other chronic pain: Principal | ICD-10-CM | POA: Insufficient documentation

## 2015-10-22 DIAGNOSIS — E119 Type 2 diabetes mellitus without complications: Secondary | ICD-10-CM | POA: Insufficient documentation

## 2015-10-22 DIAGNOSIS — Z462 Encounter for fitting and adjustment of other devices related to nervous system and special senses: Secondary | ICD-10-CM | POA: Diagnosis not present

## 2015-10-22 DIAGNOSIS — J45909 Unspecified asthma, uncomplicated: Secondary | ICD-10-CM | POA: Diagnosis not present

## 2015-10-22 DIAGNOSIS — Z981 Arthrodesis status: Secondary | ICD-10-CM | POA: Insufficient documentation

## 2015-10-22 DIAGNOSIS — Z419 Encounter for procedure for purposes other than remedying health state, unspecified: Secondary | ICD-10-CM

## 2015-10-22 HISTORY — PX: SPINAL CORD STIMULATOR INSERTION: SHX5378

## 2015-10-22 LAB — GLUCOSE, CAPILLARY
Glucose-Capillary: 171 mg/dL — ABNORMAL HIGH (ref 65–99)
Glucose-Capillary: 160 mg/dL — ABNORMAL HIGH (ref 65–99)
Glucose-Capillary: 206 mg/dL — ABNORMAL HIGH (ref 65–99)
Glucose-Capillary: 175 mg/dL — ABNORMAL HIGH (ref 65–99)
Glucose-Capillary: 133 mg/dL — ABNORMAL HIGH (ref 65–99)

## 2015-10-22 SURGERY — INSERTION, SPINAL CORD STIMULATOR, LUMBAR
Anesthesia: General

## 2015-10-22 MED ORDER — EXENATIDE ER 2 MG ~~LOC~~ PEN: 2.0000 mg | PEN_INJECTOR | SUBCUTANEOUS | Status: DC

## 2015-10-22 MED ORDER — HYDROCHLOROTHIAZIDE 25 MG PO TABS
25.0000 mg | ORAL_TABLET | Freq: Every day | ORAL | Status: DC
  Administered 2015-10-22: 25 mg via ORAL
  Filled 2015-10-22: qty 1

## 2015-10-22 MED ORDER — VITAMIN D 1000 UNITS PO TABS: 1000.0000 [IU] | ORAL_TABLET | Freq: Every morning | ORAL | Status: DC

## 2015-10-22 MED ORDER — ARTIFICIAL TEARS OP OINT
TOPICAL_OINTMENT | OPHTHALMIC | Status: AC
  Filled 2015-10-22: qty 3.5

## 2015-10-22 MED ORDER — ROCURONIUM BROMIDE 100 MG/10ML IV SOLN
INTRAVENOUS | Status: DC | PRN
  Administered 2015-10-22: 10 mg via INTRAVENOUS
  Administered 2015-10-22: 40 mg via INTRAVENOUS

## 2015-10-22 MED ORDER — ACETAMINOPHEN 650 MG RE SUPP: 650.0000 mg | RECTAL | Status: DC | PRN

## 2015-10-22 MED ORDER — METHOCARBAMOL 500 MG PO TABS
500.0000 mg | ORAL_TABLET | Freq: Four times a day (QID) | ORAL | Status: DC | PRN
  Administered 2015-10-22 – 2015-10-23 (×4): 500 mg via ORAL
  Filled 2015-10-22 (×4): qty 1

## 2015-10-22 MED ORDER — KCL IN DEXTROSE-NACL 20-5-0.45 MEQ/L-%-% IV SOLN: INTRAVENOUS | Status: DC

## 2015-10-22 MED ORDER — SODIUM CHLORIDE 0.9 % IJ SOLN
3.0000 mL | Freq: Two times a day (BID) | INTRAMUSCULAR | Status: DC
  Administered 2015-10-22 (×2): 3 mL via INTRAVENOUS

## 2015-10-22 MED ORDER — LIDOCAINE-EPINEPHRINE 1 %-1:100000 IJ SOLN
INTRAMUSCULAR | Status: DC | PRN
  Administered 2015-10-22: 15 mL

## 2015-10-22 MED ORDER — 0.9 % SODIUM CHLORIDE (POUR BTL) OPTIME
TOPICAL | Status: DC | PRN
  Administered 2015-10-22: 1000 mL

## 2015-10-22 MED ORDER — INSULIN GLARGINE 300 UNIT/ML ~~LOC~~ SOPN: 5.0000 [IU] | PEN_INJECTOR | Freq: Every day | SUBCUTANEOUS | Status: DC

## 2015-10-22 MED ORDER — SODIUM CHLORIDE 0.9 % IJ SOLN: 3.0000 mL | INTRAMUSCULAR | Status: DC | PRN

## 2015-10-22 MED ORDER — FERROUS GLUCONATE 324 (38 FE) MG PO TABS
324.0000 mg | ORAL_TABLET | Freq: Every day | ORAL | Status: DC
  Filled 2015-10-22 (×2): qty 1

## 2015-10-22 MED ORDER — TRIAMCINOLONE ACETONIDE 0.1 % EX CREA: 1.0000 "application " | TOPICAL_CREAM | CUTANEOUS | Status: DC | PRN

## 2015-10-22 MED ORDER — MIDAZOLAM HCL 2 MG/2ML IJ SOLN
INTRAMUSCULAR | Status: AC
  Filled 2015-10-22: qty 2

## 2015-10-22 MED ORDER — THROMBIN 5000 UNITS EX SOLR
CUTANEOUS | Status: DC | PRN
  Administered 2015-10-22 (×2): 5000 [IU] via TOPICAL

## 2015-10-22 MED ORDER — ZOLPIDEM TARTRATE 5 MG PO TABS: 5.0000 mg | ORAL_TABLET | Freq: Every evening | ORAL | Status: DC | PRN

## 2015-10-22 MED ORDER — PANTOPRAZOLE SODIUM 40 MG PO TBEC: 40.0000 mg | DELAYED_RELEASE_TABLET | Freq: Every day | ORAL | Status: DC

## 2015-10-22 MED ORDER — ACETAMINOPHEN 325 MG PO TABS: 650.0000 mg | ORAL_TABLET | ORAL | Status: DC | PRN

## 2015-10-22 MED ORDER — NORTRIPTYLINE HCL 10 MG PO CAPS
10.0000 mg | ORAL_CAPSULE | Freq: Every day | ORAL | Status: DC
  Administered 2015-10-22: 10 mg via ORAL
  Filled 2015-10-22 (×2): qty 1

## 2015-10-22 MED ORDER — LIDOCAINE HCL (CARDIAC) 20 MG/ML IV SOLN
INTRAVENOUS | Status: AC
  Filled 2015-10-22: qty 5

## 2015-10-22 MED ORDER — HYDROMORPHONE HCL 1 MG/ML IJ SOLN
INTRAMUSCULAR | Status: AC
  Filled 2015-10-22: qty 1

## 2015-10-22 MED ORDER — MIDAZOLAM HCL 5 MG/5ML IJ SOLN
INTRAMUSCULAR | Status: DC | PRN
  Administered 2015-10-22: 2 mg via INTRAVENOUS

## 2015-10-22 MED ORDER — NEOSTIGMINE METHYLSULFATE 10 MG/10ML IV SOLN
INTRAVENOUS | Status: DC | PRN
  Administered 2015-10-22: 3 mg via INTRAVENOUS

## 2015-10-22 MED ORDER — PANTOPRAZOLE SODIUM 40 MG PO TBEC
40.0000 mg | DELAYED_RELEASE_TABLET | Freq: Every day | ORAL | Status: DC
  Administered 2015-10-22: 40 mg via ORAL
  Filled 2015-10-22: qty 1

## 2015-10-22 MED ORDER — INSULIN ASPART 100 UNIT/ML ~~LOC~~ SOLN
0.0000 [IU] | Freq: Three times a day (TID) | SUBCUTANEOUS | Status: DC
  Administered 2015-10-22: 3 [IU] via SUBCUTANEOUS
  Administered 2015-10-22: 5 [IU] via SUBCUTANEOUS
  Administered 2015-10-23: 3 [IU] via SUBCUTANEOUS

## 2015-10-22 MED ORDER — NEOSTIGMINE METHYLSULFATE 10 MG/10ML IV SOLN
INTRAVENOUS | Status: AC
  Filled 2015-10-22: qty 1

## 2015-10-22 MED ORDER — ONDANSETRON HCL 4 MG/2ML IJ SOLN: 4.0000 mg | Freq: Once | INTRAMUSCULAR | Status: DC | PRN

## 2015-10-22 MED ORDER — MENTHOL 3 MG MT LOZG: 1.0000 | LOZENGE | OROMUCOSAL | Status: DC | PRN

## 2015-10-22 MED ORDER — LIDOCAINE HCL (CARDIAC) 20 MG/ML IV SOLN
INTRAVENOUS | Status: DC | PRN
  Administered 2015-10-22: 100 mg via INTRAVENOUS

## 2015-10-22 MED ORDER — POLYETHYLENE GLYCOL 3350 17 G PO PACK: 17.0000 g | PACK | Freq: Every day | ORAL | Status: DC | PRN

## 2015-10-22 MED ORDER — ROCURONIUM BROMIDE 50 MG/5ML IV SOLN
INTRAVENOUS | Status: AC
  Filled 2015-10-22: qty 1

## 2015-10-22 MED ORDER — DORZOLAMIDE HCL-TIMOLOL MAL 2-0.5 % OP SOLN
1.0000 [drp] | Freq: Two times a day (BID) | OPHTHALMIC | Status: DC
  Administered 2015-10-22 – 2015-10-23 (×3): 1 [drp] via OPHTHALMIC
  Filled 2015-10-22: qty 10

## 2015-10-22 MED ORDER — METHOCARBAMOL 1000 MG/10ML IJ SOLN
500.0000 mg | Freq: Four times a day (QID) | INTRAVENOUS | Status: DC | PRN
  Filled 2015-10-22: qty 5

## 2015-10-22 MED ORDER — HYDROCODONE-ACETAMINOPHEN 5-325 MG PO TABS: 1.0000 | ORAL_TABLET | ORAL | Status: DC | PRN

## 2015-10-22 MED ORDER — BUPIVACAINE HCL (PF) 0.5 % IJ SOLN
INTRAMUSCULAR | Status: DC | PRN
Start: 2015-10-22 — End: 2015-10-22
  Administered 2015-10-22: 15 mL

## 2015-10-22 MED ORDER — BISACODYL 10 MG RE SUPP: 10.0000 mg | Freq: Every day | RECTAL | Status: DC | PRN

## 2015-10-22 MED ORDER — INSULIN GLARGINE 100 UNIT/ML ~~LOC~~ SOLN
5.0000 [IU] | Freq: Every day | SUBCUTANEOUS | Status: DC
  Administered 2015-10-22: 5 [IU] via SUBCUTANEOUS
  Filled 2015-10-22 (×2): qty 0.05

## 2015-10-22 MED ORDER — PROPOFOL 10 MG/ML IV BOLUS
INTRAVENOUS | Status: DC | PRN
  Administered 2015-10-22: 200 mg via INTRAVENOUS

## 2015-10-22 MED ORDER — MUPIROCIN 2 % EX OINT: 1.0000 "application " | TOPICAL_OINTMENT | Freq: Two times a day (BID) | CUTANEOUS | Status: DC | PRN

## 2015-10-22 MED ORDER — GLYCOPYRROLATE 0.2 MG/ML IJ SOLN
INTRAMUSCULAR | Status: AC
  Filled 2015-10-22: qty 2

## 2015-10-22 MED ORDER — FENTANYL CITRATE (PF) 250 MCG/5ML IJ SOLN
INTRAMUSCULAR | Status: AC
  Filled 2015-10-22: qty 5

## 2015-10-22 MED ORDER — ENALAPRIL MALEATE 20 MG PO TABS
20.0000 mg | ORAL_TABLET | Freq: Every day | ORAL | Status: DC
  Administered 2015-10-22: 20 mg via ORAL
  Filled 2015-10-22 (×2): qty 1

## 2015-10-22 MED ORDER — KETAMINE HCL 100 MG/ML IJ SOLN
INTRAMUSCULAR | Status: AC
  Filled 2015-10-22: qty 1

## 2015-10-22 MED ORDER — CEFAZOLIN SODIUM-DEXTROSE 2-3 GM-% IV SOLR
2.0000 g | Freq: Three times a day (TID) | INTRAVENOUS | Status: AC
  Administered 2015-10-22 (×2): 2 g via INTRAVENOUS
  Filled 2015-10-22 (×2): qty 50

## 2015-10-22 MED ORDER — ONDANSETRON HCL 4 MG/2ML IJ SOLN
INTRAMUSCULAR | Status: DC | PRN
  Administered 2015-10-22: 4 mg via INTRAVENOUS

## 2015-10-22 MED ORDER — DULOXETINE HCL 30 MG PO CPEP
60.0000 mg | ORAL_CAPSULE | Freq: Two times a day (BID) | ORAL | Status: DC
  Administered 2015-10-22 (×2): 60 mg via ORAL
  Filled 2015-10-22 (×2): qty 2

## 2015-10-22 MED ORDER — HYDROMORPHONE HCL 1 MG/ML IJ SOLN
0.2500 mg | INTRAMUSCULAR | Status: DC | PRN
Start: 2015-10-22 — End: 2015-10-22
  Administered 2015-10-22 (×2): 0.5 mg via INTRAVENOUS

## 2015-10-22 MED ORDER — HEMOSTATIC AGENTS (NO CHARGE) OPTIME
TOPICAL | Status: DC | PRN
  Administered 2015-10-22: 1 via TOPICAL

## 2015-10-22 MED ORDER — ONDANSETRON HCL 4 MG/2ML IJ SOLN
INTRAMUSCULAR | Status: AC
  Filled 2015-10-22: qty 2

## 2015-10-22 MED ORDER — ONDANSETRON HCL 4 MG/2ML IJ SOLN: 4.0000 mg | INTRAMUSCULAR | Status: DC | PRN

## 2015-10-22 MED ORDER — CELECOXIB 200 MG PO CAPS
200.0000 mg | ORAL_CAPSULE | Freq: Two times a day (BID) | ORAL | Status: DC
  Administered 2015-10-22 (×2): 200 mg via ORAL
  Filled 2015-10-22 (×2): qty 1

## 2015-10-22 MED ORDER — FENTANYL CITRATE (PF) 100 MCG/2ML IJ SOLN
INTRAMUSCULAR | Status: DC | PRN
  Administered 2015-10-22 (×2): 100 ug via INTRAVENOUS
  Administered 2015-10-22: 50 ug via INTRAVENOUS
  Administered 2015-10-22: 100 ug via INTRAVENOUS

## 2015-10-22 MED ORDER — FLEET ENEMA 7-19 GM/118ML RE ENEM: 1.0000 | ENEMA | Freq: Once | RECTAL | Status: DC | PRN

## 2015-10-22 MED ORDER — ATORVASTATIN CALCIUM 20 MG PO TABS
40.0000 mg | ORAL_TABLET | Freq: Every day | ORAL | Status: DC
  Administered 2015-10-22: 40 mg via ORAL
  Filled 2015-10-22: qty 2

## 2015-10-22 MED ORDER — PROPOFOL 10 MG/ML IV BOLUS
INTRAVENOUS | Status: AC
  Filled 2015-10-22: qty 20

## 2015-10-22 MED ORDER — MORPHINE SULFATE (PF) 2 MG/ML IV SOLN
1.0000 mg | INTRAVENOUS | Status: DC | PRN
  Administered 2015-10-22: 2 mg via INTRAVENOUS
  Filled 2015-10-22: qty 1

## 2015-10-22 MED ORDER — ALUM & MAG HYDROXIDE-SIMETH 200-200-20 MG/5ML PO SUSP: 30.0000 mL | Freq: Four times a day (QID) | ORAL | Status: DC | PRN

## 2015-10-22 MED ORDER — HYDROCODONE-ACETAMINOPHEN 10-325 MG PO TABS
1.0000 | ORAL_TABLET | ORAL | Status: DC | PRN
  Administered 2015-10-22 – 2015-10-23 (×5): 2 via ORAL
  Filled 2015-10-22 (×5): qty 2

## 2015-10-22 MED ORDER — DOCUSATE SODIUM 100 MG PO CAPS
100.0000 mg | ORAL_CAPSULE | Freq: Two times a day (BID) | ORAL | Status: DC
  Administered 2015-10-22: 100 mg via ORAL
  Filled 2015-10-22: qty 1

## 2015-10-22 MED ORDER — METFORMIN HCL 500 MG PO TABS
1000.0000 mg | ORAL_TABLET | Freq: Two times a day (BID) | ORAL | Status: DC
  Administered 2015-10-22 – 2015-10-23 (×2): 1000 mg via ORAL
  Filled 2015-10-22 (×2): qty 2

## 2015-10-22 MED ORDER — ASPIRIN EC 81 MG PO TBEC
81.0000 mg | DELAYED_RELEASE_TABLET | Freq: Every day | ORAL | Status: DC
  Filled 2015-10-22: qty 1

## 2015-10-22 MED ORDER — PHENOL 1.4 % MT LIQD: 1.0000 | OROMUCOSAL | Status: DC | PRN

## 2015-10-22 MED ORDER — GLYCOPYRROLATE 0.2 MG/ML IJ SOLN
INTRAMUSCULAR | Status: DC | PRN
  Administered 2015-10-22: 0.4 mg via INTRAVENOUS

## 2015-10-22 MED ORDER — HYDROCODONE-ACETAMINOPHEN 7.5-325 MG PO TABS: 2.0000 | ORAL_TABLET | Freq: Once | ORAL | Status: DC | PRN

## 2015-10-22 MED ORDER — KETAMINE HCL 100 MG/ML IJ SOLN
INTRAMUSCULAR | Status: DC | PRN
  Administered 2015-10-22: 70 mg via INTRAVENOUS

## 2015-10-22 MED ORDER — LACTATED RINGERS IV SOLN
INTRAVENOUS | Status: DC | PRN
  Administered 2015-10-22 (×2): via INTRAVENOUS

## 2015-10-22 MED ORDER — LATANOPROST 0.005 % OP SOLN
1.0000 [drp] | Freq: Every day | OPHTHALMIC | Status: DC
  Administered 2015-10-22: 1 [drp] via OPHTHALMIC
  Filled 2015-10-22: qty 2.5

## 2015-10-22 SURGICAL SUPPLY — 68 items
BENZOIN TINCTURE PRP APPL 2/3 (GAUZE/BANDAGES/DRESSINGS) IMPLANT
BLADE CLIPPER SURG (BLADE) IMPLANT
BRUSH SCRUB EZ PLAIN DRY (MISCELLANEOUS) ×3 IMPLANT
BUR MATCHSTICK NEURO 3.0 LAGG (BURR) IMPLANT
BUR PRECISION FLUTE 5.0 (BURR) ×3 IMPLANT
CLOSURE WOUND 1/2 X4 (GAUZE/BANDAGES/DRESSINGS)
DECANTER SPIKE VIAL GLASS SM (MISCELLANEOUS) ×3 IMPLANT
DERMABOND ADVANCED (GAUZE/BANDAGES/DRESSINGS) ×2
DERMABOND ADVANCED .7 DNX12 (GAUZE/BANDAGES/DRESSINGS) ×1 IMPLANT
DRAPE C-ARM 42X72 X-RAY (DRAPES) ×3 IMPLANT
DRAPE INCISE IOBAN 85X60 (DRAPES) ×3 IMPLANT
DRAPE LAPAROTOMY 100X72X124 (DRAPES) ×3 IMPLANT
DRAPE POUCH INSTRU U-SHP 10X18 (DRAPES) ×3 IMPLANT
DRAPE SURG 17X23 STRL (DRAPES) ×3 IMPLANT
DRSG OPSITE 4X5.5 SM (GAUZE/BANDAGES/DRESSINGS) ×6 IMPLANT
DRSG TEGADERM 4X4.75 (GAUZE/BANDAGES/DRESSINGS) ×6 IMPLANT
DRSG TELFA 3X8 NADH (GAUZE/BANDAGES/DRESSINGS) ×3 IMPLANT
ELECT BLADE 4.0 EZ CLEAN MEGAD (MISCELLANEOUS) ×3
ELECT REM PT RETURN 9FT ADLT (ELECTROSURGICAL) ×3
ELECTRODE BLDE 4.0 EZ CLN MEGD (MISCELLANEOUS) ×1 IMPLANT
ELECTRODE REM PT RTRN 9FT ADLT (ELECTROSURGICAL) ×1 IMPLANT
ELEVATER PASSER (SPINAL CORD STIMULATOR) ×3
GAUZE SPONGE 4X4 12PLY STRL (GAUZE/BANDAGES/DRESSINGS) ×6 IMPLANT
GAUZE SPONGE 4X4 16PLY XRAY LF (GAUZE/BANDAGES/DRESSINGS) IMPLANT
GLOVE BIO SURGEON STRL SZ8 (GLOVE) ×3 IMPLANT
GLOVE BIOGEL PI IND STRL 8 (GLOVE) ×1 IMPLANT
GLOVE BIOGEL PI IND STRL 8.5 (GLOVE) ×1 IMPLANT
GLOVE BIOGEL PI INDICATOR 8 (GLOVE) ×2
GLOVE BIOGEL PI INDICATOR 8.5 (GLOVE) ×2
GLOVE ECLIPSE 7.5 STRL STRAW (GLOVE) ×3 IMPLANT
GLOVE EXAM NITRILE LRG STRL (GLOVE) IMPLANT
GLOVE EXAM NITRILE MD LF STRL (GLOVE) IMPLANT
GLOVE EXAM NITRILE XL STR (GLOVE) IMPLANT
GLOVE EXAM NITRILE XS STR PU (GLOVE) IMPLANT
GOWN STRL REUS W/ TWL LRG LVL3 (GOWN DISPOSABLE) IMPLANT
GOWN STRL REUS W/ TWL XL LVL3 (GOWN DISPOSABLE) IMPLANT
GOWN STRL REUS W/TWL 2XL LVL3 (GOWN DISPOSABLE) IMPLANT
GOWN STRL REUS W/TWL LRG LVL3 (GOWN DISPOSABLE)
GOWN STRL REUS W/TWL XL LVL3 (GOWN DISPOSABLE)
IPG PRECISION SPECTRA (Stimulator) ×3 IMPLANT
KIT BASIN OR (CUSTOM PROCEDURE TRAY) ×3 IMPLANT
KIT CHARGING (KITS) ×2
KIT CHARGING PRECISION NEURO (KITS) ×1 IMPLANT
KIT PAT PROGRAM FREELINK (KITS) ×1 IMPLANT
KIT ROOM TURNOVER OR (KITS) ×3 IMPLANT
LEAD COVER EDGE 50CM STIM KIT (Lead) ×3 IMPLANT
NEEDLE HYPO 25X1 1.5 SAFETY (NEEDLE) ×3 IMPLANT
NS IRRIG 1000ML POUR BTL (IV SOLUTION) ×3 IMPLANT
PACK LAMINECTOMY NEURO (CUSTOM PROCEDURE TRAY) ×3 IMPLANT
PAD ARMBOARD 7.5X6 YLW CONV (MISCELLANEOUS) ×9 IMPLANT
PASSER ELEVATOR (SPINAL CORD STIMULATOR) ×1 IMPLANT
REMOTE CONTROL KIT (KITS) ×3
SPONGE LAP 4X18 X RAY DECT (DISPOSABLE) IMPLANT
SPONGE SURGIFOAM ABS GEL SZ50 (HEMOSTASIS) ×3 IMPLANT
STAPLER SKIN PROX WIDE 3.9 (STAPLE) ×3 IMPLANT
STRIP CLOSURE SKIN 1/2X4 (GAUZE/BANDAGES/DRESSINGS) IMPLANT
SUT SILK 0 TIES 10X30 (SUTURE) ×3 IMPLANT
SUT SILK 2 0 FS (SUTURE) ×6 IMPLANT
SUT SILK 2 0 TIES 10X30 (SUTURE) ×3 IMPLANT
SUT VIC AB 0 CT1 18XCR BRD8 (SUTURE) ×1 IMPLANT
SUT VIC AB 0 CT1 8-18 (SUTURE) ×2
SUT VIC AB 2-0 CP2 18 (SUTURE) ×6 IMPLANT
SUT VIC AB 3-0 SH 8-18 (SUTURE) ×6 IMPLANT
TAPE CLOTH SURG 4X10 WHT LF (GAUZE/BANDAGES/DRESSINGS) ×3 IMPLANT
TOOL LONG TUNNEL (SPINAL CORD STIMULATOR) ×3 IMPLANT
TOWEL OR 17X24 6PK STRL BLUE (TOWEL DISPOSABLE) IMPLANT
TOWEL OR 17X26 10 PK STRL BLUE (TOWEL DISPOSABLE) ×3 IMPLANT
WATER STERILE IRR 1000ML POUR (IV SOLUTION) ×3 IMPLANT

## 2015-10-22 NOTE — Anesthesia Preprocedure Evaluation (Addendum)
Anesthesia Evaluation  Patient identified by MRN, date of birth, ID band Patient awake    Reviewed: Allergy & Precautions, H&P , NPO status , Patient's Chart, lab work & pertinent test results  Airway Mallampati: II  TM Distance: >3 FB Neck ROM: full    Dental no notable dental hx. (+) Teeth Intact, Dental Advisory Given,    Pulmonary asthma ,  TMJ problems   Pulmonary exam normal breath sounds clear to auscultation       Cardiovascular Exercise Tolerance: Good hypertension, Pt. on medications Normal cardiovascular exam Rhythm:Regular Rate:Normal     Neuro/Psych  Headaches, Glaucoma. Chronic back pain  Neuromuscular disease negative psych ROS   GI/Hepatic negative GI ROS, Neg liver ROS, hiatal hernia, GERD  Medicated and Controlled,  Endo/Other  diabetes, Poorly Controlled, Type 2, Oral Hypoglycemic Agents, Insulin DependentHypothyroidism Adrenal mass  Renal/GU negative Renal ROS  negative genitourinary   Musculoskeletal   Abdominal   Peds  Hematology negative hematology ROS (+)   Anesthesia Other Findings Blind OD w/ limited vision OS  Patient has chronic pain, takes vicodin 1pill every 2 hours, unable to swallow pills without thick liquids like apple sauce she reports bc of "small trachea"  Previous Grade 1 view in past  Reproductive/Obstetrics negative OB ROS                           Anesthesia Physical  Anesthesia Plan  ASA: III  Anesthesia Plan: General   Post-op Pain Management:    Induction: Intravenous  Airway Management Planned: Oral ETT  Additional Equipment: None  Intra-op Plan:   Post-operative Plan: Extubation in OR  Informed Consent: I have reviewed the patients History and Physical, chart, labs and discussed the procedure including the risks, benefits and alternatives for the proposed anesthesia with the patient or authorized representative who has indicated  his/her understanding and acceptance.   Dental Advisory Given  Plan Discussed with: CRNA and Anesthesiologist  Anesthesia Plan Comments:         Anesthesia Quick Evaluation

## 2015-10-22 NOTE — Interval H&P Note (Signed)
History and Physical Interval Note:  10/22/2015 7:08 AM  Robyn Hill  has presented today for surgery, with the diagnosis of Lumbosacral neuritis  The various methods of treatment have been discussed with the patient and family. After consideration of risks, benefits and other options for treatment, the patient has consented to  Procedure(s) with comments: Laminectomy for spinal cord stimulator paddle lead and implantable generator placement (N/A) - Laminectomy for spinal cord stimulator paddle lead and implantable generator placement as a surgical intervention .  The patient's history has been reviewed, patient examined, no change in status, stable for surgery.  I have reviewed the patient's chart and labs.  Questions were answered to the patient's satisfaction.     Robyn Hill D

## 2015-10-22 NOTE — Anesthesia Procedure Notes (Signed)
Procedure Name: Intubation Date/Time: 10/22/2015 7:35 AM Performed by: Barrington Ellison Pre-anesthesia Checklist: Patient identified, Emergency Drugs available, Suction available, Patient being monitored and Timeout performed Patient Re-evaluated:Patient Re-evaluated prior to inductionOxygen Delivery Method: Circle system utilized Preoxygenation: Pre-oxygenation with 100% oxygen Intubation Type: IV induction Ventilation: Mask ventilation without difficulty Laryngoscope Size: Mac and 3 Grade View: Grade I Tube type: Oral Tube size: 7.0 mm Number of attempts: 1 Airway Equipment and Method: Stylet Placement Confirmation: ETT inserted through vocal cords under direct vision,  positive ETCO2 and breath sounds checked- equal and bilateral Secured at: 21 cm Tube secured with: Tape Dental Injury: Teeth and Oropharynx as per pre-operative assessment

## 2015-10-22 NOTE — Brief Op Note (Signed)
10/22/2015  9:08 AM  PATIENT:  Robyn Hill  70 y.o. female  PRE-OPERATIVE DIAGNOSIS:  Lumbosacral neuritis with chronic pain  POST-OPERATIVE DIAGNOSIS:  Lumbosacral neuritis with chronic pain  PROCEDURE:  Procedure(s) with comments: Laminectomy for spinal cord stimulator paddle lead and implantable generator placement (N/A) - Laminectomy for spinal cord stimulator paddle lead and implantable generator placement  SURGEON:  Surgeon(s) and Role:    * Erline Levine, MD - Primary  PHYSICIAN ASSISTANT:   ASSISTANTS: [place], RN   ANESTHESIA:   general  EBL:  Total I/O In: -  Out: 50 [Blood:50]  BLOOD ADMINISTERED:none  DRAINS: none   LOCAL MEDICATIONS USED:  LIDOCAINE   SPECIMEN:  No Specimen  DISPOSITION OF SPECIMEN:  N/A  COUNTS:  YES  TOURNIQUET:  * No tourniquets in log *  DICTATION: DICTATION: Patient is 70 year old woman with previous fusion surgery of the lumbar spine who has chronic pain. She did well with a percutaneous trial of spinal cord stimulation, but efforts to place a percutaneous lead was unsuccessful and she now presents for laminectomy and placement of spinal cord stimulator.  Procedure: Following smooth and uncomplicated induction of general endotracheal anesthesia the patient was placed in a prone position on flat rolls. C-arm was used to mark the T9 level.  Her right flank was also marked for placement of implantable pulse generator. Her back was then prepped and draped in the usual sterile fashion with Betadine scrub and paint followed by Duraprep. Area of planned incision was infiltrated with local lidocaine. An incision was made carried to the thoracic fascia which was incised on the right side of midline and a subperiosteal dissection was performed exposing the T9 10 interlaminar space. After confirming correct orientation with C-arm a thoracic laminectomy was performed a high-speed drill and Kerrison rongeurs exposing the spinal cord dura. A 4 x 8 paddle  electrode was then inserted in the epidural space and was found to be well positioned within the midline up to the inferior aspect of T7. We were not able to place the lead exactly in the midline, so elected to place the lead traversing the midline with superior aspect to the eft and inferior aspect to the right.  Anchors were then placed in the fascia was closed. A subcutaneous pocket was created to the right of midline. The tunneler was used to create a subcutaneous tract for the electrodes and these were then passed, anchored to the implantable pulse generator and connections were torqued appropriately. Redundant electrode was circularized beneath the IPG and a strain release loop was placed in the laminectomy incision site. The wounds were irrigated and closed with 20 and 3-0 Vicryl stitches and the wounds were dressed with Dermabond. Impedances were assessed and found to be correct. Final radiograph demonstrated that the lead was in the midline from the inferior half of T7 to the superior aspect of T 9.. Patient was taken to recovery having tolerated procedure well counts were correct at the end of the case.   PLAN OF CARE: Admit for overnight observation  PATIENT DISPOSITION:  PACU - hemodynamically stable.   Delay start of Pharmacological VTE agent (>24hrs) due to surgical blood loss or risk of bleeding: yes

## 2015-10-22 NOTE — Progress Notes (Signed)
Patient ID: Robyn Hill, female   DOB: 09/25/1946, 70 y.o.   MRN: [id number] Alert, up in chair eating supper. MAEW. Reports pain to the right of the thoracic incision with some movements. Reassured re: muscle spasm from incision and sutured tissues. Drsgs intact & dry without erythema, welling, or drainage. Pt reports good stim coverage from new spinal cord stimulator.   Verdis Prime RN BSN

## 2015-10-22 NOTE — Progress Notes (Signed)
Awake, sleepy, but arousable.  MAEW.

## 2015-10-22 NOTE — Transfer of Care (Signed)
Immediate Anesthesia Transfer of Care Note  Patient: Robyn Hill  Procedure(s) Performed: Procedure(s) with comments: Laminectomy for spinal cord stimulator paddle lead and implantable generator placement (N/A) - Laminectomy for spinal cord stimulator paddle lead and implantable generator placement  Patient Location: PACU  Anesthesia Type:General  Level of Consciousness: lethargic and responds to stimulation  Airway & Oxygen Therapy: Patient Spontanous Breathing and Patient connected to nasal cannula oxygen  Post-op Assessment: Report given to RN and Patient moving all extremities X 4  Post vital signs: Reviewed and stable  Last Vitals:  Filed Vitals:   10/22/15 0631  BP: 137/51  Pulse: 73  Temp: 36.4 C  Resp: 20    Complications: No apparent anesthesia complications

## 2015-10-22 NOTE — Op Note (Signed)
10/22/2015  9:08 AM  PATIENT:  Robyn Hill  70 y.o. female  PRE-OPERATIVE DIAGNOSIS:  Lumbosacral neuritis with chronic pain  POST-OPERATIVE DIAGNOSIS:  Lumbosacral neuritis with chronic pain  PROCEDURE:  Procedure(s) with comments: Laminectomy for spinal cord stimulator paddle lead and implantable generator placement (N/A) - Laminectomy for spinal cord stimulator paddle lead and implantable generator placement  SURGEON:  Surgeon(s) and Role:    * Erline Levine, MD - Primary  PHYSICIAN ASSISTANT:   ASSISTANTS: [place], RN   ANESTHESIA:   general  EBL:  Total I/O In: -  Out: 50 [Blood:50]  BLOOD ADMINISTERED:none  DRAINS: none   LOCAL MEDICATIONS USED:  LIDOCAINE   SPECIMEN:  No Specimen  DISPOSITION OF SPECIMEN:  N/A  COUNTS:  YES  TOURNIQUET:  * No tourniquets in log *  DICTATION: DICTATION: Patient is 70 year old woman with previous fusion surgery of the lumbar spine who has chronic pain. She did well with a percutaneous trial of spinal cord stimulation, but efforts to place a percutaneous lead was unsuccessful and she now presents for laminectomy and placement of spinal cord stimulator.  Procedure: Following smooth and uncomplicated induction of general endotracheal anesthesia the patient was placed in a prone position on flat rolls. C-arm was used to mark the T9 level.  Her right flank was also marked for placement of implantable pulse generator. Her back was then prepped and draped in the usual sterile fashion with Betadine scrub and paint followed by Duraprep. Area of planned incision was infiltrated with local lidocaine. An incision was made carried to the thoracic fascia which was incised on the right side of midline and a subperiosteal dissection was performed exposing the T9 10 interlaminar space. After confirming correct orientation with C-arm a thoracic laminectomy was performed a high-speed drill and Kerrison rongeurs exposing the spinal cord dura. A 4 x 8 paddle  electrode was then inserted in the epidural space and was found to be well positioned within the midline up to the inferior aspect of T7. We were not able to place the lead exactly in the midline, so elected to place the lead traversing the midline with superior aspect to the eft and inferior aspect to the right.  Anchors were then placed in the fascia was closed. A subcutaneous pocket was created to the right of midline. The tunneler was used to create a subcutaneous tract for the electrodes and these were then passed, anchored to the implantable pulse generator and connections were torqued appropriately. Redundant electrode was circularized beneath the IPG and a strain release loop was placed in the laminectomy incision site. The wounds were irrigated and closed with 20 and 3-0 Vicryl stitches and the wounds were dressed with Dermabond. Impedances were assessed and found to be correct. Final radiograph demonstrated that the lead was in the midline from the inferior half of T7 to the superior aspect of T 9.. Patient was taken to recovery having tolerated procedure well counts were correct at the end of the case.   PLAN OF CARE: Admit for overnight observation  PATIENT DISPOSITION:  PACU - hemodynamically stable.   Delay start of Pharmacological VTE agent (>24hrs) due to surgical blood loss or risk of bleeding: yes

## 2015-10-22 NOTE — Anesthesia Postprocedure Evaluation (Signed)
Anesthesia Post Note  Patient: Robyn Hill  Procedure(s) Performed: Procedure(s) (LRB): Laminectomy for spinal cord stimulator paddle lead and implantable generator placement (N/A)  Patient location during evaluation: PACU Anesthesia Type: General Level of consciousness: awake and alert Pain management: pain level controlled Vital Signs Assessment: post-procedure vital signs reviewed and stable Respiratory status: spontaneous breathing, nonlabored ventilation, respiratory function stable and patient connected to nasal cannula oxygen Cardiovascular status: blood pressure returned to baseline and stable Postop Assessment: no signs of nausea or vomiting Anesthetic complications: no    Last Vitals:  Filed Vitals:   10/22/15 1045 10/22/15 1159  BP: 155/66 130/52  Pulse: 85 84  Temp: 36.6 C 36.7 C  Resp: 18 18    Last Pain:  Filed Vitals:   10/22/15 1419  PainSc: 10-Worst pain ever                 Zenaida Deed

## 2015-10-22 NOTE — Progress Notes (Signed)
OT evaluation   10/22/15 1640  OT Visit Information  Last OT Received On 10/22/15  Assistance Needed +1  History of Present Illness 70 y.o. s/p Laminectomy for spinal cord stimulator paddle lead and implantable generator placement. Pt reports problem with LUE but unsure if she injured it.  Precautions  Precautions Fall;Back  Precaution Booklet Issued Yes (comment)  Precaution Comments educated on back precautions  Restrictions  Weight Bearing Restrictions No  Home Living  Family/patient expects to be discharged to: Private residence  Living Arrangements Spouse/significant other  Available Help at Discharge Family;Available 24 hours/day  Type of [place] One level  Bathroom Shower/Tub Tub/shower unit  Tax adviser - 2 wheels;Cane - single Licensed conveyancer Other (Comment) (long brush)  Prior Function  Level of Independence Needs assistance  ADL's / [place] assist with opening items/containers and managing fasteners.  Communication  Communication No difficulties  Pain Assessment  Pain Assessment 0-10  Pain Score 9  Pain Location back at incision and LUE   Pain Descriptors / Indicators Sore;Burning  Pain Intervention(s) Monitored during session;Limited activity within patient's tolerance  Cognition  Arousal/Alertness Awake/alert  Behavior During Therapy WFL for tasks assessed/performed  Overall Cognitive Status Within Functional Limits for tasks assessed  Upper Extremity Assessment  Upper Extremity Assessment LUE deficits/detail  LUE Deficits / Details pain with internal rotation; able to perform shoulder flexion a little over 90 degrees  Lower Extremity Assessment  Lower Extremity Assessment Defer to PT evaluation  ADL  Overall ADL's  Needs assistance/impaired  Grooming Wash/dry hands;Set up;Supervision/safety;Standing  Lower Body Dressing  Moderate assistance;Sit to/from Retail buyer Minimal assistance;Ambulation;Regular Toilet;Grab bars  Toileting- Clothing Manipulation and Hygiene Supervision/safety;Sit to/from stand;Set up  Functional mobility during ADLs Minimal assistance  General ADL Comments Explained AE is available for LB dressing. Discussed incorporating precautions into functional activities.  Bed Mobility  Overal bed mobility Needs Assistance  Bed Mobility Rolling;Sidelying to Sit;Sit to Sidelying  Rolling Supervision  Sidelying to sit Min assist  Sit to sidelying Min assist  General bed mobility comments assist with trunk when coming to sitting position and assist with LE when returning to bed. Cues given for technique.  Transfers  Overall transfer level Needs assistance  Equipment used 1 person hand held assist (grab bar)  Transfers Sit to/from Stand  Sit to Stand Min assist  OT - End of Session  Equipment Utilized During Treatment Gait belt  Activity Tolerance Patient limited by pain  Patient left in bed;with call bell/phone within reach  Nurse Communication Mobility status;Other (comment) (pain; no OT follow up)  OT Assessment  OT Therapy Diagnosis  Acute pain  OT Recommendation/Assessment Patient needs continued OT Services  OT Problem List Decreased range of motion;Pain;Decreased knowledge of precautions;Decreased knowledge of use of DME or AE;Impaired balance (sitting and/or standing);Decreased activity tolerance;Impaired UE functional use  OT Plan  OT Frequency (ACUTE ONLY) Min 2X/week  OT Treatment/Interventions (ACUTE ONLY) Self-care/ADL training;DME and/or AE instruction;Therapeutic activities;Patient/family education;Balance training;Therapeutic exercise  OT Recommendation  Follow Up Recommendations No OT follow up;Supervision - Intermittent  OT Equipment Other (comment) (AE if wanted)  Individuals Consulted  Consulted and Agree with Results and Recommendations Patient  Acute Rehab  OT Goals  Patient Stated Goal go home tomorrow  OT Goal Formulation With patient  Time For Goal Achievement 10/29/15  Potential to Achieve Goals Good  OT Time Calculation  OT Start Time (  ACUTE ONLY) 1453  OT Stop Time (ACUTE ONLY) 1510  OT Time Calculation (min) 17 min  OT G-codes **NOT FOR INPATIENT CLASS**  Functional Assessment Tool Used clinical judgment  Functional Limitation Self care  Self Care Current Status [id number]) CJ  Self Care Goal Status [id number]) CI  OT General Charges  $OT Visit 1 Procedure  OT Evaluation  $OT Eval Low Complexity 1 Procedure  Written Expression  Dominant Hand Right   Roseanne Reno, OTR/L [phone]

## 2015-10-22 NOTE — Evaluation (Signed)
Physical Therapy Evaluation Patient Details Name: Robyn Hill MRN: [id number] DOB: 01-01-1946 Today's Date: 10/22/2015   History of Present Illness  70 y.o. s/p Laminectomy for spinal cord stimulator paddle lead and implantable generator placement. Pt reports problem with LUE but unsure if she injured it.  Clinical Impression  Patient is s/p above surgery resulting in the deficits listed below (see PT Problem List). Pt with burning R thoracic pain but able to stand upright. Pt with good home set up and support. Patient will benefit from skilled PT to increase their independence and safety with mobility (while adhering to their precautions) to allow discharge to the venue listed below.      Follow Up Recommendations No PT follow up;Supervision/Assistance - 24 hour    Equipment Recommendations   (tub bench)    Recommendations for Other Services       Precautions / Restrictions Precautions Precautions: Fall;Back Precaution Booklet Issued: Yes (comment) Precaution Comments: educated on back precautions Required Braces or Orthoses:  (helped figure out how to turn on remote for simulator) Restrictions Weight Bearing Restrictions: No      Mobility  Bed Mobility Overal bed mobility: Needs Assistance Bed Mobility: Rolling;Sidelying to Sit;Sit to Sidelying Rolling: Supervision Sidelying to sit: Min assist     Sit to sidelying: Min assist General bed mobility comments: assist with trunk when coming to sitting position and assist with LE when returning to bed. Cues given for technique.  Transfers Overall transfer level: Needs assistance Equipment used: 1 person hand held assist (grab bar) Transfers: Sit to/from Stand Sit to Stand: Min assist         General transfer comment: v/c's for hand placement, assist to scoot to EOB  Ambulation/Gait Ambulation/Gait assistance: Min guard Ambulation Distance (Feet): 75 Feet Assistive device: Rolling walker (2 wheeled) Gait  Pattern/deviations: Step-through pattern;Decreased stride length Gait velocity: slow Gait velocity interpretation: Below normal speed for age/gender General Gait Details: slow and guarded but able to stand upright  Stairs            Wheelchair Mobility    Modified Rankin (Stroke Patients Only)       Balance Overall balance assessment: Needs assistance         Standing balance support: During functional activity Standing balance-Leahy Scale: Fair Standing balance comment: pt able to stand at sink and wash hands without difficulty                             Pertinent Vitals/Pain Pain Assessment: 0-10 Pain Score: 9  Pain Location: R thoracic Pain Descriptors / Indicators: Burning Pain Intervention(s): Monitored during session    Home Living Family/patient expects to be discharged to:: Private residence Living Arrangements: Spouse/significant other Available Help at Discharge: Family;Available 24 hours/day Type of Home: House Home Access: Ramped entrance     Home Layout: One level Home Equipment: Walker - 2 wheels;Cane - single point;Bedside commode;Adaptive equipment      Prior Function Level of Independence: Needs assistance   Gait / Transfers Assistance Needed: was walking without AD  ADL's / Homemaking Assistance Needed: assist with opening items/containers and managing fasteners.        Hand Dominance   Dominant Hand: Right    Extremity/Trunk Assessment   Upper Extremity Assessment: Defer to OT evaluation       LUE Deficits / Details: pain with internal rotation; able to perform shoulder flexion a little over 90 degrees   Lower Extremity  Assessment: Overall WFL for tasks assessed      Cervical / Trunk Assessment:  (recent back surgery)  Communication   Communication: No difficulties  Cognition Arousal/Alertness: Awake/alert Behavior During Therapy: WFL for tasks assessed/performed Overall Cognitive Status: Within Functional  Limits for tasks assessed                      General Comments General comments (skin integrity, edema, etc.): pt assisted to the bathroom, supervision for hygiene    Exercises        Assessment/Plan    PT Assessment Patient needs continued PT services  PT Diagnosis Difficulty walking;Acute pain   PT Problem List Decreased strength;Decreased range of motion;Decreased activity tolerance;Decreased balance;Decreased mobility  PT Treatment Interventions DME instruction;Gait training;Stair training;Functional mobility training;Therapeutic activities;Therapeutic exercise   PT Goals (Current goals can be found in the Care Plan section) Acute Rehab PT Goals Patient Stated Goal: go home tomorrow PT Goal Formulation: With patient Time For Goal Achievement: 10/29/15 Potential to Achieve Goals: Good    Frequency Min 4X/week   Barriers to discharge        Co-evaluation               End of Session   Activity Tolerance: Patient tolerated treatment well Patient left: in chair;with call bell/phone within reach Nurse Communication: Mobility status    Functional Assessment Tool Used: clinical judgement Functional Limitation: Mobility: Walking and moving around Mobility: Walking and Moving Around Current Status [id number]): At least 1 percent but less than 20 percent impaired, limited or restricted Mobility: Walking and Moving Around Goal Status [phone]): At least 1 percent but less than 20 percent impaired, limited or restricted    Time: [id number] PT Time Calculation (min) (ACUTE ONLY): 26 min   Charges:   PT Evaluation $PT Eval Moderate Complexity: 1 Procedure PT Treatments $Gait Training: 8-22 mins   PT G Codes:   PT G-Codes **NOT FOR INPATIENT CLASS** Functional Assessment Tool Used: clinical judgement Functional Limitation: Mobility: Walking and moving around Mobility: Walking and Moving Around Current Status [id number]): At least 1 percent but less than 20 percent  impaired, limited or restricted Mobility: Walking and Moving Around Goal Status [phone]): At least 1 percent but less than 20 percent impaired, limited or restricted    Kingsley Callander 10/22/2015, 4:52 PM  Kittie Plater, PT, DPT Pager #: [phone] Office #: [phone]

## 2015-10-23 ENCOUNTER — Encounter (HOSPITAL_COMMUNITY): Payer: Self-pay | Admitting: Neurosurgery

## 2015-10-23 ENCOUNTER — Encounter: Payer: Self-pay | Admitting: Medical

## 2015-10-23 DIAGNOSIS — E039 Hypothyroidism, unspecified: Secondary | ICD-10-CM | POA: Diagnosis not present

## 2015-10-23 DIAGNOSIS — M961 Postlaminectomy syndrome, not elsewhere classified: Secondary | ICD-10-CM | POA: Diagnosis not present

## 2015-10-23 DIAGNOSIS — G8929 Other chronic pain: Secondary | ICD-10-CM | POA: Diagnosis not present

## 2015-10-23 DIAGNOSIS — J45909 Unspecified asthma, uncomplicated: Secondary | ICD-10-CM | POA: Diagnosis not present

## 2015-10-23 DIAGNOSIS — E119 Type 2 diabetes mellitus without complications: Secondary | ICD-10-CM | POA: Diagnosis not present

## 2015-10-23 DIAGNOSIS — M5417 Radiculopathy, lumbosacral region: Secondary | ICD-10-CM | POA: Diagnosis not present

## 2015-10-23 LAB — GLUCOSE, CAPILLARY: Glucose-Capillary: 151 mg/dL — ABNORMAL HIGH (ref 65–99)

## 2015-10-23 MED ORDER — METHOCARBAMOL 500 MG PO TABS: 500.0000 mg | ORAL_TABLET | Freq: Four times a day (QID) | ORAL | Status: DC | PRN

## 2015-10-23 NOTE — Progress Notes (Signed)
Subjective: Patient reports sore, otherwise good.  Objective: Vital signs in last 24 hours: Temp:  [96.8 F (36 C)-98.5 F (36.9 C)] 97.8 F (36.6 C) (01/04 0800) Pulse Rate:  [80-93] 84 (01/04 0800) Resp:  [11-27] 18 (01/04 0800) BP: (96-155)/(49-69) 135/69 mmHg (01/04 0800) SpO2:  [86 %-99 %] 96 % (01/04 0800)  Intake/Output from previous day: 01/03 0701 - 01/04 0700 In: 2200 [P.O.:1200; I.V.:1000] Out: 50 [Blood:50] Intake/Output this shift:    Physical Exam: Ambulating well.  Dressings CDI.  Lab Results: No results for input(s): WBC, HGB, HCT, PLT in the last 72 hours. BMET No results for input(s): NA, K, CL, CO2, GLUCOSE, BUN, CREATININE, CALCIUM in the last 72 hours.  Studies/Results: Dg Thoracic Spine 1 View  10/22/2015  CLINICAL DATA:  70 year old female undergoing thoracic spinal stimulator placement. Initial encounter. EXAM: DG C-ARM 61-120 MIN; THORACIC SPINE - 1 VIEW COMPARISON:  [place] [facility] lumbar radiographs 01/09/2015. [place] chest radiographs 10/23/2013. FLUOROSCOPY TIME:  0 minutes 24 seconds FINDINGS: A single intraoperative fluoroscopic image of the lower thoracic spine. If the lowest visualized ribs on this image are T12, then the thoracic spinal stimulator projects over the T8 vertebral body. However, the thoracic level is not confirmed on this image as neither the L1 level cholecystectomy clips or mid lumbar fusion hardware is visible. IMPRESSION: Thoracic spinal stimulator device in place. Electronically Signed   By: Genevie Ann M.D.   On: 10/22/2015 09:42   Dg C-arm 1-60 Min  10/22/2015  CLINICAL DATA:  70 year old female undergoing thoracic spinal stimulator placement. Initial encounter. EXAM: DG C-ARM 61-120 MIN; THORACIC SPINE - 1 VIEW COMPARISON:  [place] [facility] lumbar radiographs 01/09/2015. [place] chest radiographs 10/23/2013. FLUOROSCOPY TIME:  0 minutes 24 seconds FINDINGS: A single intraoperative fluoroscopic image of the lower  thoracic spine. If the lowest visualized ribs on this image are T12, then the thoracic spinal stimulator projects over the T8 vertebral body. However, the thoracic level is not confirmed on this image as neither the L1 level cholecystectomy clips or mid lumbar fusion hardware is visible. IMPRESSION: Thoracic spinal stimulator device in place. Electronically Signed   By: Genevie Ann M.D.   On: 10/22/2015 09:42    Assessment/Plan: Doing well.  D/C home.  F/U 2 weeks in office.       Peggyann Shoals, MD 10/23/2015, 8:04 AM

## 2015-10-23 NOTE — Progress Notes (Signed)
Patient alert and oriented, mae's well, voiding adequate amount of urine, swallowing without difficulty, c/o mild pain. Patient discharged home with husband. Script and discharged instructions given to patient. Patient and family stated understanding of d/c instructions given and has an appointment with MD.

## 2015-10-23 NOTE — Discharge Summary (Signed)
Physician Discharge Summary  Patient ID: Robyn Hill MRN: [id number] DOB/AGE: 1946-07-27 70 y.o.  Admit date: 10/22/2015 Discharge date: 10/23/2015  Admission Diagnoses:Chronic pain with lumbar radiculopathy  Discharge Diagnoses: Same Active Problems:   Chronic pain   Discharged Condition: good  Hospital Course: Patient underwent thoracic laminectomy with placement of [employer] Spinal Cord Stimulator and IPG.  She did well with surgery and was discharged home on POD 1.  Consults: None  Significant Diagnostic Studies: None  Treatments: surgery: thoracic laminectomy with placement of [employer] Spinal Cord Stimulator and IPG  Discharge Exam: Blood pressure 135/69, pulse 84, temperature 97.8 F (36.6 C), temperature source Oral, resp. rate 18, height 5' 5.5" (1.664 m), weight 95.8 kg (211 lb 3.2 oz), SpO2 96 %. Neurologic: Alert and oriented X 3, normal strength and tone. Normal symmetric reflexes. Normal coordination and gait Wound:CDI  Disposition: Home     Medication List    TAKE these medications        aspirin EC 81 MG tablet  Take 1 tablet (81 mg total) by mouth daily.     atorvastatin 40 MG tablet  Commonly known as:  LIPITOR  Take 1 tablet (40 mg total) by mouth daily.     celecoxib 200 MG capsule  Commonly known as:  CELEBREX  Take 1 capsule (200 mg total) by mouth 2 (two) times daily.     cholecalciferol 1000 units tablet  Commonly known as:  VITAMIN D  Take 1,000 Units by mouth every morning.     dexlansoprazole 60 MG capsule  Commonly known as:  DEXILANT  Take 1 capsule (60 mg total) by mouth 2 (two) times daily.     DEXILANT 60 MG capsule  Generic drug:  dexlansoprazole  TAKE 1 CAPSULE BY MOUTH TWICE DAILY     dorzolamide-timolol 22.3-6.8 MG/ML ophthalmic solution  Commonly known as:  COSOPT  Place 1 drop into both eyes 2 (two) times daily.     DULoxetine 60 MG capsule  Commonly known as:  CYMBALTA  Take 1 capsule (60 mg  total) by mouth 2 (two) times daily.     enalapril 20 MG tablet  Commonly known as:  VASOTEC  Take 20 mg by mouth daily.     Exenatide ER 2 MG Pen  Commonly known as:  BYDUREON  Inject 2 mg into the skin once a week.     ferrous gluconate 324 MG tablet  Commonly known as:  FERGON  Take 324 mg by mouth daily with breakfast.     hydrochlorothiazide 25 MG tablet  Commonly known as:  HYDRODIURIL  Take 1 tablet (25 mg total) by mouth daily.     HYDROcodone-acetaminophen 10-325 MG tablet  Commonly known as:  NORCO  Take 1-2 tablets by mouth every 4 (four) hours as needed for moderate pain or severe pain.     Insulin Glargine 300 UNIT/ML Sopn  Commonly known as:  TOUJEO SOLOSTAR  Inject 5 Units into the skin at bedtime.     latanoprost 0.005 % ophthalmic solution  Commonly known as:  XALATAN  Place 1 drop into the right eye at bedtime.     metFORMIN 1000 MG tablet  Commonly known as:  GLUCOPHAGE  Take 1 tablet (1,000 mg total) by mouth 2 (two) times daily with a meal.     methocarbamol 500 MG tablet  Commonly known as:  ROBAXIN  Take 1 tablet (500 mg total) by mouth every 6 (six) hours as needed for muscle spasms.  mupirocin ointment 2 %  Commonly known as:  BACTROBAN  Apply 1 application topically 2 (two) times daily. As needed for infection on arms or legs     nortriptyline 10 MG capsule  Commonly known as:  PAMELOR  TAKE 1 CAPSULE BY MOUTH AT BEDTIME     Pen Needles 31G X 6 MM Misc  Use these for Toujeo pen.     triamcinolone cream 0.1 %  Commonly known as:  KENALOG  Apply 1 application topically as needed (for skin irritation).           Follow-up Information    Follow up with Peggyann Shoals, MD.   Specialty:  Neurosurgery   Contact information:   [address] [place] [address] [phone]       Signed: Peggyann Shoals, MD 10/23/2015, 8:05 AM

## 2015-10-23 NOTE — Progress Notes (Signed)
Physical Therapy Treatment Patient Details Name: Robyn Hill MRN: [id number] DOB: 1945-12-02 Today's Date: 10/23/2015    History of Present Illness 70 y.o. s/p Laminectomy for spinal cord stimulator paddle lead and implantable generator placement. Pt reports problem with LUE but unsure if she injured it.    PT Comments    Pt progressing towards physical therapy goals. Was able to progress gait training this session and states she feels ready to return home today. Back precautions and general safety recommendations were reviewed during session and pt states she has no further questions. Will continue to follow until d/c.   Follow Up Recommendations  No PT follow up;Supervision/Assistance - 24 hour     Equipment Recommendations   (tub bench)    Recommendations for Other Services       Precautions / Restrictions Precautions Precautions: Fall;Back Precaution Comments: Pt was able to state 3/3 back precautions at end of session without cueing.  Restrictions Weight Bearing Restrictions: No    Mobility  Bed Mobility               General bed mobility comments: Pt sitting up in recliner upon PT arrival.   Transfers Overall transfer level: Needs assistance Equipment used: Rolling walker (2 wheeled) Transfers: Sit to/from Stand Sit to Stand: Min assist         General transfer comment: Pt demonstrated good hand placement on seated surface for safety. Pt required no assistance to achieve full stand.   Ambulation/Gait Ambulation/Gait assistance: Supervision Ambulation Distance (Feet): 200 Feet Assistive device: Rolling walker (2 wheeled) Gait Pattern/deviations: Step-through pattern;Decreased stride length;Trunk flexed Gait velocity: Decreased Gait velocity interpretation: Below normal speed for age/gender General Gait Details: VC's for improved posture and walker placement close to pt's body.   Stairs Stairs:  (Pt declined stair training - has a ramp at home)          Wheelchair Mobility    Modified Rankin (Stroke Patients Only)       Balance Overall balance assessment: Needs assistance Sitting-balance support: Feet supported;No upper extremity supported Sitting balance-Leahy Scale: Fair     Standing balance support: During functional activity;No upper extremity supported Standing balance-Leahy Scale: Fair                      Cognition Arousal/Alertness: Awake/alert Behavior During Therapy: WFL for tasks assessed/performed Overall Cognitive Status: Within Functional Limits for tasks assessed                      Exercises      General Comments        Pertinent Vitals/Pain Pain Assessment: 0-10 Pain Score: 9  Pain Location: Back Pain Descriptors / Indicators: Operative site guarding;Discomfort Pain Intervention(s): Limited activity within patient's tolerance;Monitored during session;Repositioned    Home Living                      Prior Function            PT Goals (current goals can now be found in the care plan section) Acute Rehab PT Goals Patient Stated Goal: Home today PT Goal Formulation: With patient Time For Goal Achievement: 10/29/15 Potential to Achieve Goals: Good Progress towards PT goals: Progressing toward goals    Frequency  Min 4X/week    PT Plan Current plan remains appropriate    Co-evaluation             End of Session   Activity  Tolerance: Patient tolerated treatment well Patient left: in chair;with call bell/phone within reach     Time: [id number] PT Time Calculation (min) (ACUTE ONLY): 12 min  Charges:  $Gait Training: 8-22 mins                    G Codes:      Rolinda Roan 11/17/2015, 9:19 AM   Rolinda Roan, PT, DPT Acute Rehabilitation Services Pager: [phone]

## 2015-10-23 NOTE — Progress Notes (Addendum)
Occupational Therapy Treatment Patient Details Name: Robyn Hill MRN: [id number] DOB: 25-May-1946 Today's Date: 10/23/2015    History of present illness 70 y.o. s/p Laminectomy for spinal cord stimulator paddle lead and implantable generator placement. Pt reports problem with LUE but unsure if she injured it.   OT comments  Pt progressing. Education provided in session.    Follow Up Recommendations  No OT follow up;Supervision - Intermittent    Equipment Recommendations  None recommended by OT    Recommendations for Other Services      Precautions / Restrictions Precautions Precautions: Fall;Back Precaution Comments: reviewed precautions Restrictions Weight Bearing Restrictions: No       Mobility Bed Mobility               General bed mobility comments: briefly talked about that at home she would use mattress to hold to and pt reports she has dresser near.  Transfers Overall transfer level: Needs assistance  Transfers: Sit to/from Stand Sit to Stand: Supervision (set up for RW)           Balance Min guard for simulated tub transfer.               ADL Overall ADL's : Needs assistance/impaired                       Lower Body Dressing Details (indicate cue type and reason): pt able to cross legs over knees in session Toilet Transfer: Supervision/safety;Set up;Ambulation;RW (ambulated with and without RW; sit to stand from chair)       Tub/ Shower Transfer: Tub transfer;Supervision/safety;Ambulation (used RW to ambulate close to simulated tub)   Functional mobility during ADLs: Supervision/safety;Rolling walker General ADL Comments: Discussed incorporating precautions into functional activities. Educated on AE and also explained what pt could use for toilet hygiene if needed. Educated on tub transfer and pt practiced simulated tub transfer. Educated on safety such as safe shoewear and recommended someone be with her for tub transfer.  talked  about DME. Recommended not to hold onto towel rack.      Vision                     Perception     Praxis      Cognition  Awake/Alert Behavior During Therapy: WFL for tasks assessed/performed Overall Cognitive Status: Within Functional Limits for tasks assessed                       Extremity/Trunk Assessment               Exercises     Shoulder Instructions       General Comments      Pertinent Vitals/ Pain       Pain Assessment: 0-10 Pain Score:  (8-9) Pain Location: back Pain Intervention(s): Monitored during session;Repositioned (notified nurse)  Home Living                                          Prior Functioning/Environment              Frequency Min 2X/week     Progress Toward Goals  OT Goals(current goals can now be found in the care plan section)  Progress towards OT goals: Progressing toward goals-pt verbalized information of information covered in session  Acute Rehab OT Goals Patient Stated  Goal: not stated OT Goal Formulation: With patient Time For Goal Achievement: 10/29/15 Potential to Achieve Goals: Good ADL Goals Pt Will Perform Lower Body Dressing: with set-up;with supervision;sit to/from stand (with or without AE) Pt Will Transfer to Toilet: with supervision;ambulating;with set-up (3 in 1 over commode) Pt Will Perform Toileting - Clothing Manipulation and hygiene: with modified independence;sit to/from stand Pt Will Perform Tub/Shower Transfer: Tub transfer;ambulating;3 in 1;with set-up;with supervision;rolling walker Additional ADL Goal #1: Pt will independently verbalize 3/3 back precautions and maintain during session.  Plan Discharge plan remains appropriate    Co-evaluation                 End of Session Equipment Utilized During Treatment: Rolling walker;Other (comment) (showed her AE briefly)   Activity Tolerance Patient tolerated treatment well   Patient Left in  chair;with call bell/phone within reach   Nurse Communication Other (comment) (pain level)        Time: [id number] OT Time Calculation (min): 16 min  Charges: OT General Charges $OT Visit: 1 Procedure OT Treatments $Self Care/Home Management : 8-22 mins    Benito Mccreedy OTR/L [id number] 10/23/2015, 11:22 AM

## 2015-10-30 ENCOUNTER — Other Ambulatory Visit: Payer: Self-pay | Admitting: Medical

## 2015-10-30 ENCOUNTER — Encounter: Payer: Self-pay | Admitting: Medical

## 2015-11-06 ENCOUNTER — Telehealth: Payer: Self-pay

## 2015-11-06 ENCOUNTER — Encounter: Payer: Self-pay | Admitting: Medical

## 2015-11-06 ENCOUNTER — Ambulatory Visit (INDEPENDENT_AMBULATORY_CARE_PROVIDER_SITE_OTHER): Payer: Medicare Other | Admitting: Medical

## 2015-11-06 VITALS — BP 130/80 | HR 72 | Wt 210.0 lb

## 2015-11-06 DIAGNOSIS — E1141 Type 2 diabetes mellitus with diabetic mononeuropathy: Secondary | ICD-10-CM

## 2015-11-06 DIAGNOSIS — M1990 Unspecified osteoarthritis, unspecified site: Secondary | ICD-10-CM | POA: Diagnosis not present

## 2015-11-06 DIAGNOSIS — N390 Urinary tract infection, site not specified: Secondary | ICD-10-CM | POA: Diagnosis not present

## 2015-11-06 LAB — POCT URINALYSIS DIPSTICK
Bilirubin, UA: NEGATIVE
Blood, UA: NEGATIVE
Ketones, UA: NEGATIVE
Protein, UA: NEGATIVE
Spec Grav, UA: >=1.03
Urobilinogen, UA: NEGATIVE
pH, UA: 6

## 2015-11-06 MED ORDER — CEPHALEXIN 500 MG PO CAPS: 500.0000 mg | ORAL_CAPSULE | Freq: Three times a day (TID) | ORAL | Status: DC

## 2015-11-06 MED ORDER — PEN NEEDLES 31G X 6 MM MISC: Status: DC

## 2015-11-06 NOTE — Telephone Encounter (Signed)
Make sure she is aware that labs were done with preop in December, and last A1C was in December, last lipid October.   So insurance may not pay for HgbA1C since one was done in late December, and last lipids looked so good in October, I feel comfortable repeating in the summer.    If she insists I'll put orders in, but I am not worried anything is out of whack.

## 2015-11-06 NOTE — Telephone Encounter (Signed)
Patient returned call, advised her of information from [place] below

## 2015-11-06 NOTE — Addendum Note (Signed)
Addended by: Billie Lade on: 11/06/2015 09:42 AM   Modules accepted: Orders

## 2015-11-06 NOTE — Telephone Encounter (Signed)
Pt wants to come back for fasting labs and an A1c check. Can you please put orders in the system? She was told it would have to be approved by you to be able to come for labs, she would like a call back letting her know if she is ok to come for that and if so she wants to be put on the schedule.

## 2015-11-06 NOTE — Telephone Encounter (Signed)
LM to CB

## 2015-11-06 NOTE — Progress Notes (Signed)
Subjective: Chief Complaint  Patient presents with  . Follow-up    pt is not fasting. had juice. states that she thinks she has uti, urine is obtained. jan 3rd pt got a spinal cord stimulator   Having cloudy urine, itching vaginally, odorous urine.  No vaginal discharge.   No recent antibiotics.   Last yeast infection was when she was on Invokana this past year.    Feels like she has UTI.   Been feeling this way since 10/23/15.   Diabetes - from last visit we advised she continue Metformin BID, c/t Bydureon, but we added Toujeo 5 units at bedtime.   She is compliant.   Glucose has been running 119-135 fasting.    Hasn't checked into cream with compounding pharmacy regarding OA.   Still taking Celebrex  Since last visit Dr. Vertell Limber did laminectomy and installed [facility].   Past Medical History  Diagnosis Date  . Hypertension   . Hyperlipidemia   . Glaucoma   . Urinary incontinence   . Chronic back pain   . Osteoarthritis   . Edema   . Female bladder prolapse   . Allergy   . GERD (gastroesophageal reflux disease)   . Diverticulosis   . Chronic diarrhea     Colestipol therapy  . Adrenal mass, right ([place])     01/2010, MRI abdomen  . Blood transfusion 1970  . Vitamin D deficiency   . Rheumatic fever     age 70  . Diverticulitis   . Fatty liver   . H/O hiatal hernia   . Blind right eye     since childhood  . Asthma     "as a teenager"  . Type II diabetes mellitus ([place])   . Kidney stones   . History of UTI     remote past  . H/O mammogram 07/2012    normal  . H/O bone density study 11/29/2014    normal study although mild decreased in density from prior study  . History of uterine cancer     s/p hysterectomy  . TMJ (dislocation of temporomandibular joint)     Hx: of  . Neuromuscular disorder ([place])     diabetic neuropahthy  . Hypothyroidism     hx of   . Migraine     hx of migraines in past   . Anemia   . Tinea pedis     bilateral feet    . Adrenal mass, right ([place])     being followed per patient  . Numbness and tingling of both legs   . Hemorrhoids   . Wears glasses   . Vision loss of left eye    ROS as in subjective   Objective: BP 130/80 mmHg  Pulse 72  Wt 210 lb (95.255 kg)  General appearance: alert, no distress, WD/WN Neck: supple, no lymphadenopathy, no thyromegaly, no masses Heart: RRR, normal S1, S2, no murmurs Lungs: CTA bilaterally, no wheezes, rhonchi, or rales Abdomen: +bs, soft, non tender, non distended, no masses, no hepatomegaly, no splenomegaly    Assessment: Encounter Diagnoses  Name Primary?  . Type 2 diabetes mellitus with diabetic mononeuropathy, without long-term current use of insulin ([place]) Yes  . Arthritis   . Urinary tract infection, site not specified     Plan: Diabetes type 2 - c/t Metformin 1000mg  BID, c/t Bydureon weekly, c/t Toujeo 5 units QHS.  Recheck in 3-4 mo  arthritis - c/t Celebrex  UTI - begin Keflex.  reviewed urine culture from 10/2014.   Call if not resolving or if worse

## 2015-11-09 ENCOUNTER — Telehealth: Payer: Self-pay | Admitting: Medical

## 2015-11-09 NOTE — Telephone Encounter (Signed)
Recv'd fax for P.A. [place], called pharmacy & it went thru for 30 days instead of 90 days for $0 co pay without P.A.

## 2015-11-11 ENCOUNTER — Telehealth: Payer: Self-pay | Admitting: Medical

## 2015-11-11 ENCOUNTER — Other Ambulatory Visit: Payer: Self-pay | Admitting: Medical

## 2015-11-11 ENCOUNTER — Telehealth: Payer: Self-pay

## 2015-11-11 MED ORDER — INSULIN GLARGINE 100 UNIT/ML ~~LOC~~ SOLN: 5.0000 [IU] | Freq: Every day | SUBCUTANEOUS | Status: DC

## 2015-11-11 NOTE — Telephone Encounter (Signed)
Pt is aware. She said that she thinks that the bydueron will be getting declined soon too.

## 2015-11-11 NOTE — Telephone Encounter (Signed)
Enalapril/Vasotec was sent in October for 90 day + refill. So she shouldn't need refill currently.  Call and check, and maybe she is using a different pharmacy

## 2015-11-11 NOTE — Telephone Encounter (Signed)
Pt states she does not need that refill

## 2015-11-11 NOTE — Telephone Encounter (Signed)
[facility] in [place], [place] sent a refill request for Enalapril 20mg  #90

## 2015-11-11 NOTE — Telephone Encounter (Signed)
I dont see where we have ever filled this is it on to fill?

## 2015-11-11 NOTE — Telephone Encounter (Signed)
Insurance is not going to cover [employer].  Thus, I sent Lantus injection to pharmacy.   Once she runs out of Toujeo, switch to Lantus, same 5 units QHS.   This medication works the same way as Programme researcher, broadcasting/film/video.

## 2015-11-11 NOTE — Telephone Encounter (Signed)
LMTCB

## 2015-11-17 ENCOUNTER — Encounter: Payer: Self-pay | Admitting: Medical

## 2015-11-18 ENCOUNTER — Other Ambulatory Visit: Payer: Self-pay | Admitting: Medical

## 2015-11-18 MED ORDER — DEXLANSOPRAZOLE 60 MG PO CPDR: 1.0000 | DELAYED_RELEASE_CAPSULE | Freq: Two times a day (BID) | ORAL | Status: DC

## 2015-11-20 ENCOUNTER — Telehealth: Payer: Self-pay | Admitting: Family Medicine

## 2015-11-20 NOTE — Telephone Encounter (Signed)
[facility] refill for Enalapril 20 mg tablets  #90

## 2015-11-21 ENCOUNTER — Other Ambulatory Visit: Payer: Self-pay | Admitting: Medical

## 2015-11-21 MED ORDER — ENALAPRIL MALEATE 20 MG PO TABS: 20.0000 mg | ORAL_TABLET | Freq: Every day | ORAL | Status: DC

## 2015-11-21 NOTE — Telephone Encounter (Signed)
Med sent.

## 2015-11-25 ENCOUNTER — Other Ambulatory Visit: Payer: Self-pay | Admitting: Medical

## 2015-11-25 ENCOUNTER — Encounter: Payer: Self-pay | Admitting: Medical

## 2015-12-07 ENCOUNTER — Telehealth: Payer: Self-pay | Admitting: Medical

## 2015-12-07 NOTE — Telephone Encounter (Signed)
P.A. TOUJEO  °

## 2015-12-09 DIAGNOSIS — Z6836 Body mass index (BMI) 36.0-36.9, adult: Secondary | ICD-10-CM | POA: Diagnosis not present

## 2015-12-09 DIAGNOSIS — I10 Essential (primary) hypertension: Secondary | ICD-10-CM | POA: Diagnosis not present

## 2015-12-09 DIAGNOSIS — Z9889 Other specified postprocedural states: Secondary | ICD-10-CM | POA: Diagnosis not present

## 2015-12-09 DIAGNOSIS — M961 Postlaminectomy syndrome, not elsewhere classified: Secondary | ICD-10-CM | POA: Diagnosis not present

## 2015-12-09 DIAGNOSIS — M4310 Spondylolisthesis, site unspecified: Secondary | ICD-10-CM | POA: Diagnosis not present

## 2015-12-09 DIAGNOSIS — M4806 Spinal stenosis, lumbar region: Secondary | ICD-10-CM | POA: Diagnosis not present

## 2015-12-09 DIAGNOSIS — M5417 Radiculopathy, lumbosacral region: Secondary | ICD-10-CM | POA: Diagnosis not present

## 2015-12-09 NOTE — Telephone Encounter (Signed)
P.A. Approved til 10/18/16, pt informed, faxed pharmacy

## 2016-01-02 DIAGNOSIS — H401133 Primary open-angle glaucoma, bilateral, severe stage: Secondary | ICD-10-CM | POA: Diagnosis not present

## 2016-01-10 ENCOUNTER — Telehealth: Payer: Self-pay | Admitting: Medical

## 2016-01-10 ENCOUNTER — Other Ambulatory Visit: Payer: Self-pay | Admitting: Medical

## 2016-01-10 MED ORDER — PANTOPRAZOLE SODIUM 40 MG PO TBEC: DELAYED_RELEASE_TABLET | ORAL | Status: DC

## 2016-01-10 MED ORDER — INSULIN DEGLUDEC 100 UNIT/ML ~~LOC~~ SOPN: 5.0000 [IU] | PEN_INJECTOR | Freq: Every day | SUBCUTANEOUS | Status: DC

## 2016-01-10 NOTE — Telephone Encounter (Signed)
Forwarding to laura  

## 2016-01-10 NOTE — Telephone Encounter (Signed)
Insurer is requiring [place] to change from Lantus to [place] and Dexilant to protonix.   So both new medications were sent, and when she runs out of the current medications she will change to these.   The insulin units will remain the same.

## 2016-01-10 NOTE — Telephone Encounter (Signed)
I had already completed prior auths for Pt and she has already been approved for [employer] so there is no need for [place] to switch.  I called pt and asked if she wanted to switch and she said no that it was only costing her $3.20 a month.  And she is not even on the Lantus, that was only if the [facility] wasn't approved but it was .  Can you please switch her back

## 2016-01-13 MED ORDER — DEXLANSOPRAZOLE 60 MG PO CPDR: 60.0000 mg | DELAYED_RELEASE_CAPSULE | Freq: Two times a day (BID) | ORAL | Status: DC

## 2016-01-13 NOTE — Telephone Encounter (Signed)
[facility]!  This is so aggravating, why did we even get the notice then.  This just wastes our time and causes confusion.   So have her c/t what she was on and call pharmacy before she runs out.

## 2016-01-13 NOTE — Telephone Encounter (Signed)
Called pharmacy and cancelled Lantus, Pantoprazole and Tresiba.  Pt has refills on Dexilant and Toujeo.  Pt informed of letter from ins company & mix up

## 2016-02-19 ENCOUNTER — Encounter: Payer: Self-pay | Admitting: Medical

## 2016-02-19 ENCOUNTER — Ambulatory Visit (INDEPENDENT_AMBULATORY_CARE_PROVIDER_SITE_OTHER): Payer: Medicare Other | Admitting: Medical

## 2016-02-19 VITALS — BP 126/88 | HR 73 | Ht 62.25 in | Wt 214.0 lb

## 2016-02-19 DIAGNOSIS — N8110 Cystocele, unspecified: Secondary | ICD-10-CM | POA: Diagnosis not present

## 2016-02-19 DIAGNOSIS — Z01411 Encounter for gynecological examination (general) (routine) with abnormal findings: Secondary | ICD-10-CM | POA: Diagnosis not present

## 2016-02-19 DIAGNOSIS — R748 Abnormal levels of other serum enzymes: Secondary | ICD-10-CM | POA: Diagnosis not present

## 2016-02-19 DIAGNOSIS — Z8542 Personal history of malignant neoplasm of other parts of uterus: Secondary | ICD-10-CM | POA: Diagnosis not present

## 2016-02-19 DIAGNOSIS — G609 Hereditary and idiopathic neuropathy, unspecified: Secondary | ICD-10-CM | POA: Diagnosis not present

## 2016-02-19 DIAGNOSIS — D179 Benign lipomatous neoplasm, unspecified: Secondary | ICD-10-CM

## 2016-02-19 DIAGNOSIS — Z0000 Encounter for general adult medical examination without abnormal findings: Secondary | ICD-10-CM | POA: Diagnosis not present

## 2016-02-19 DIAGNOSIS — H409 Unspecified glaucoma: Secondary | ICD-10-CM

## 2016-02-19 DIAGNOSIS — D500 Iron deficiency anemia secondary to blood loss (chronic): Secondary | ICD-10-CM

## 2016-02-19 DIAGNOSIS — K219 Gastro-esophageal reflux disease without esophagitis: Secondary | ICD-10-CM

## 2016-02-19 DIAGNOSIS — E1141 Type 2 diabetes mellitus with diabetic mononeuropathy: Secondary | ICD-10-CM | POA: Diagnosis not present

## 2016-02-19 DIAGNOSIS — Z1159 Encounter for screening for other viral diseases: Secondary | ICD-10-CM | POA: Insufficient documentation

## 2016-02-19 DIAGNOSIS — Z8639 Personal history of other endocrine, nutritional and metabolic disease: Secondary | ICD-10-CM

## 2016-02-19 DIAGNOSIS — G589 Mononeuropathy, unspecified: Secondary | ICD-10-CM | POA: Diagnosis not present

## 2016-02-19 DIAGNOSIS — I10 Essential (primary) hypertension: Secondary | ICD-10-CM

## 2016-02-19 DIAGNOSIS — G8929 Other chronic pain: Secondary | ICD-10-CM

## 2016-02-19 DIAGNOSIS — L749 Eccrine sweat disorder, unspecified: Secondary | ICD-10-CM

## 2016-02-19 DIAGNOSIS — N8111 Cystocele, midline: Secondary | ICD-10-CM

## 2016-02-19 DIAGNOSIS — M1990 Unspecified osteoarthritis, unspecified site: Secondary | ICD-10-CM | POA: Diagnosis not present

## 2016-02-19 DIAGNOSIS — K649 Unspecified hemorrhoids: Secondary | ICD-10-CM | POA: Insufficient documentation

## 2016-02-19 DIAGNOSIS — M549 Dorsalgia, unspecified: Secondary | ICD-10-CM | POA: Diagnosis not present

## 2016-02-19 DIAGNOSIS — E785 Hyperlipidemia, unspecified: Secondary | ICD-10-CM | POA: Diagnosis not present

## 2016-02-19 DIAGNOSIS — D72829 Elevated white blood cell count, unspecified: Secondary | ICD-10-CM

## 2016-02-19 DIAGNOSIS — K644 Residual hemorrhoidal skin tags: Secondary | ICD-10-CM

## 2016-02-19 DIAGNOSIS — Z1231 Encounter for screening mammogram for malignant neoplasm of breast: Secondary | ICD-10-CM | POA: Insufficient documentation

## 2016-02-19 DIAGNOSIS — M4316 Spondylolisthesis, lumbar region: Secondary | ICD-10-CM | POA: Diagnosis not present

## 2016-02-19 DIAGNOSIS — K648 Other hemorrhoids: Secondary | ICD-10-CM | POA: Insufficient documentation

## 2016-02-19 LAB — COMPREHENSIVE METABOLIC PANEL
ALT: 41 U/L — ABNORMAL HIGH (ref 6–29)
AST: 31 U/L (ref 10–35)
Albumin: 4.3 g/dL (ref 3.6–5.1)
Alkaline Phosphatase: 96 U/L (ref 33–130)
BUN: 15 mg/dL (ref 7–25)
CO2: 27 mmol/L (ref 20–31)
Calcium: 9.8 mg/dL (ref 8.6–10.4)
Chloride: 98 mmol/L (ref 98–110)
Creat: 0.66 mg/dL (ref 0.50–0.99)
Glucose, Bld: 152 mg/dL — ABNORMAL HIGH (ref 65–99)
Potassium: 4.5 mmol/L (ref 3.5–5.3)
Sodium: 136 mmol/L (ref 135–146)
Total Bilirubin: 0.4 mg/dL (ref 0.2–1.2)
Total Protein: 7 g/dL (ref 6.1–8.1)

## 2016-02-19 LAB — HEMOGLOBIN A1C
Hgb A1c MFr Bld: 7.6 % — ABNORMAL HIGH (ref <5.7)
Mean Plasma Glucose: 171 mg/dL

## 2016-02-19 LAB — CBC WITH DIFFERENTIAL/PLATELET
Basophils Absolute: 101 cells/uL (ref 0–200)
Basophils Relative: 1 %
Eosinophils Absolute: 505 cells/uL — ABNORMAL HIGH (ref 15–500)
Eosinophils Relative: 5 %
HCT: 38.5 % (ref 35.0–45.0)
Hemoglobin: 12.6 g/dL (ref 11.7–15.5)
Lymphocytes Relative: 29 %
Lymphs Abs: 2929 cells/uL (ref 850–3900)
MCH: 28.1 pg (ref 27.0–33.0)
MCHC: 32.7 g/dL (ref 32.0–36.0)
MCV: 85.9 fL (ref 80.0–100.0)
MPV: 10.7 fL (ref 7.5–12.5)
Monocytes Absolute: 909 cells/uL (ref 200–950)
Monocytes Relative: 9 %
Neutro Abs: 5656 cells/uL (ref 1500–7800)
Neutrophils Relative %: 56 %
Platelets: 341 10*3/uL (ref 140–400)
RBC: 4.48 MIL/uL (ref 3.80–5.10)
RDW: 12.9 % (ref 11.0–15.0)
WBC: 10.1 10*3/uL (ref 4.0–10.5)

## 2016-02-19 LAB — POCT URINALYSIS DIPSTICK
Bilirubin, UA: NEGATIVE
Blood, UA: NEGATIVE
Glucose, UA: NEGATIVE
Ketones, UA: NEGATIVE
Leukocytes, UA: NEGATIVE
Nitrite, UA: NEGATIVE
Protein, UA: NEGATIVE
Spec Grav, UA: 1.02
Urobilinogen, UA: NEGATIVE
pH, UA: 7

## 2016-02-19 LAB — T4, FREE: Free T4: 0.9 ng/dL (ref 0.8–1.8)

## 2016-02-19 LAB — TSH: TSH: 2.3 mIU/L

## 2016-02-19 NOTE — Patient Instructions (Signed)
  A preventative services visit was completed today.  During the course of the visit today, we discussed and counseled about appropriate screening and preventive services.  A health risk assessment was established today that included a review of current medications, allergies, social history, family history, medical and preventative health history, biometrics, and preventative screenings to identify potential safety concerns or impairments.  A personalized plan was printed today for your records and use.   Personalized health advice and education was given today to reduce health risks and promote self management and wellness.  Information regarding end of life planning was discussed today.  Conditions/risks identified: Pain, sweating, hemorrhoids  Chronic problems discussed today: Encounter Diagnoses  Name Primary?  . Medicare annual wellness visit, subsequent Yes  . Hyperlipidemia   . Elevated liver enzymes   . Spondylolisthesis of lumbar region   . Essential hypertension, benign   . Chronic back pain   . Glaucoma   . Iron deficiency anemia secondary to blood loss (chronic)   . History of uterine cancer   . Type 2 diabetes mellitus with diabetic mononeuropathy, without long-term current use of insulin ([place])   . Prolapse of female bladder, acquired   . Idiopathic peripheral neuropathy ([facility])   . Arthritis   . History of thyroid disease   . Chronic pain   . Lipoma   . Leukocytosis   . Sweating abnormality   . External hemorrhoid    Hyperlipidemia - continue Lipitor 40mg  and Aspirin 81mg  daily at bedtime  Diabetes - continue Bydureon 2mg  weekly injection, continue Toujeo long acting insulin 5 units daily at bedtime, continue Metformin 1000 mg twice daily, continue daily foot checks, yearly eye exams for diabetic retinopathy, and continue diabetic diet.    Elevated LFTs - repeat labs today  glaucoma - continue routine follow up with eye doctor  Chronic back pain - continue Cymbalta  60mg  twice daily  Hypertension - continue Enalapril 20mg  daily, continue Hydrochlorothiazide 25mg  daily  history of thyroid disease - labs today  Sweating abnormality - follow up pending labs  Leukocytosis - continue to monitor labs  Anemia - continue Fergon/iron daily  Neuropathy - continue Nortriptyline 10mg  daily at bedtime  Arthritis - continue Celebrex 200mg  twice daily  GERD - continue Dexilant 60mg  daily in the morning  External hemorrhoid - consider referral to GI for banding procedure   Acute problems discussed today: Sweating - follow up pending labs  Recommendations:  I recommend a yearly ophthalmology/optometry visit for glaucoma screening and eye checkup  I recommended a yearly dental visit for hygiene and checkup  Advanced directives - I recommend you complete a Living Will and [place] forms if you have not done so, or get [place] a copy if you have done this already.  I recommend a screening mammogram every 1-2 years  You are up to date on colonoscopy  You are up to date on bone density screening  Referrals today: None   Immunizations: I recommended a yearly influenza vaccine, typically in September when the vaccine is usually available Is the Pneumococcal vaccine up to date: yes. Is the Shingles vaccine up to date: yes.   Is the Td/Tdap vaccine up to date: no. You are due for Tetanus and diptheria vaccine at this time.

## 2016-02-19 NOTE — Progress Notes (Signed)
Subjective:    Robyn Hill is a 70 y.o. female who presents for Preventative Services visit and chronic medical problems/med check visit.    Primary Care Provider Crisoforo Oxford, PA-C here for primary care  Current Health Care Team:  Dentist, [place] doctor, Dr. Gae Bon, [facility], PA-C Neurosurgeon, Dr. Vertell Limber Urology, not sure Pain mgmt, Dr Maryjean Ka  Medical Services you may have received from other than [facility] providers in the past year (date may be approximate) No  Exercise Current exercise habits: The patient does not participate in regular exercise at present.   Nutrition/Diet Current diet: in general, a "healthy" diet    Depression Screen Depression screen PHQ 2/9 02/19/2016  Decreased Interest 0  Down, Depressed, Hopeless 0  PHQ - 2 Score 0    Activities of Daily Living Screen/Functional Status Survey Is the patient deaf or have difficulty hearing?: No (only in crowds) Does the patient have difficulty seeing, even when wearing glasses/contacts?: Yes Does the patient have difficulty concentrating, remembering, or making decisions?: Yes (remembering) Does the patient have difficulty walking or climbing stairs?: Yes (back problems) Does the patient have difficulty dressing or bathing?: No Does the patient have difficulty doing errands alone such as visiting a doctor's office or shopping?: Yes (can not drive)  Can patient draw a clock face showing 3:15 o'clock, yes  Fall Risk Screen Fall Risk  02/19/2016 06/27/2014  Falls in the past year? No No    Gait Assessment: Normal gait observed yes  Advanced directives Does patient have a [place]? Yes Does patient have a Living Will? Yes  Past Medical History  Diagnosis Date  . Hypertension   . Hyperlipidemia   . Glaucoma   . Urinary incontinence   . Chronic back pain   . Osteoarthritis   . Edema   . Female bladder prolapse   . Allergy   . GERD (gastroesophageal reflux  disease)   . Diverticulosis   . Chronic diarrhea     Colestipol therapy  . Blood transfusion 1970  . Vitamin D deficiency   . Rheumatic fever     age 52  . Diverticulitis   . Fatty liver   . H/O hiatal hernia   . Blind right eye     since childhood  . Asthma     "as a teenager"  . Type II diabetes mellitus ([place])   . Kidney stones   . History of UTI     remote past  . H/O bone density study 11/29/2014    normal study although mild decreased in density from prior study  . History of uterine cancer     s/p hysterectomy  . Neuromuscular disorder ([place])     diabetic neuropahthy  . Hypothyroidism     hx of   . Migraine     hx of migraines in past   . Anemia   . Adrenal mass, right ([place])   . Numbness and tingling of both legs   . Hemorrhoids   . Wears glasses   . Vision loss of left eye     Past Surgical History  Procedure Laterality Date  . Tonsillectomy and adenoidectomy  1952  . Minor laceration repair      right eye  . Cardiac catheterization  11/2006  . Liver biopsy  04/1999  . Edsi for back pain  last 01/2009    multiple x 5  . Steroid injections of hip  last 02/2009  multiple x 4  . Eye surgery      Bil Blind R eye  . Posterior fusion lumbar spine  04/05/12  . Breast biopsy  08/1997    right  . Kidney stone surgery  1979  . Cholecystectomy    . Trabeculectomy  1996    bilaterally; "for glaucoma"  . Dilation and curettage of uterus  1980  . Back surgery    . Colonoscopy  2014    polyps, external hemorrhoids  . Abdominal hysterectomy  1980    no further pap smears needed  . Upper gi endoscopy  05/21/14    food, retained in the body of the stomach  . Cystoscopy N/A 01/29/2015    Procedure: CYSTOSCOPY;  Surgeon: Bjorn Loser, MD;  Location: [facility];  Service: Urology;  Laterality: N/A;  . Anterior and posterior repair N/A 01/29/2015    Procedure: CYSTOCELE AND RECTOCELE ;  Surgeon: Bjorn Loser, MD;  Location: [facility];  Service: Urology;  Laterality: N/A;    . Vaginal prolapse repair N/A 01/29/2015    Procedure:  VAULT PROLAPSE WITH GRAFT ;  Surgeon: Bjorn Loser, MD;  Location: [facility];  Service: Urology;  Laterality: N/A;  . Spinal cord stimulator insertion N/A 09/06/2015    Procedure: LUMBAR SPINAL CORD STIMULATOR INSERTION;  Surgeon: Clydell Hakim, MD;  Location: [place] [facility];  Service: Neurosurgery;  Laterality: N/A;  Spinal Cord Stimulator placement  . Spinal cord stimulator insertion N/A 10/22/2015    Procedure: Laminectomy for spinal cord stimulator paddle lead and implantable generator placement;  Surgeon: Erline Levine, MD;  Location: [place] [facility];  Service: Neurosurgery;  Laterality: N/A;  Laminectomy for spinal cord stimulator paddle lead and implantable generator placement    Social History   Social History  . Marital Status: Married    Spouse Name: N/A  . Number of Children: N/A  . Years of Education: N/A   Occupational History  . Not on file.   Social History Main Topics  . Smoking status: Never Smoker   . Smokeless tobacco: Never Used  . Alcohol Use: Yes     Comment: rare  . Drug Use: No  . Sexual Activity: No     Comment: married   Other Topics Concern  . Not on file   Social History Narrative    Family History  Problem Relation Age of Onset  . Anesthesia problems Neg Hx   . Cancer - Colon Brother   . Heart disease Brother   . Hypertension Other   . Diabetes Other   . Cancer Mother     aplastic anemia  . Cancer - Colon Brother     colon ca     Current outpatient prescriptions:  .  aspirin EC 81 MG tablet, Take 1 tablet (81 mg total) by mouth daily., Disp: 90 tablet, Rfl: 3 .  atorvastatin (LIPITOR) 40 MG tablet, Take 1 tablet (40 mg total) by mouth daily. (Patient taking differently: Take 40 mg by mouth daily. Taking at bedtime), Disp: 90 tablet, Rfl: 3 .  celecoxib (CELEBREX) 200 MG capsule, Take 1 capsule (200 mg total) by mouth 2 (two) times daily., Disp: 60 capsule, Rfl: 5 .  cholecalciferol  (VITAMIN D) 1000 UNITS tablet, Take 1,000 Units by mouth every morning. , Disp: , Rfl:  .  dexlansoprazole (DEXILANT) 60 MG capsule, Take 1 capsule (60 mg total) by mouth 2 (two) times daily., Disp: 180 capsule, Rfl: 0 .  dorzolamide-timolol (COSOPT) 22.3-6.8 MG/ML ophthalmic solution, Place 1  drop into both eyes 2 (two) times daily., Disp: , Rfl:  .  DULoxetine (CYMBALTA) 60 MG capsule, Take 1 capsule (60 mg total) by mouth 2 (two) times daily., Disp: 180 capsule, Rfl: 3 .  enalapril (VASOTEC) 20 MG tablet, Take 1 tablet (20 mg total) by mouth daily., Disp: 90 tablet, Rfl: 3 .  Exenatide ER (BYDUREON) 2 MG PEN, Inject 2 mg into the skin once a week., Disp: 4 each, Rfl: 11 .  ferrous gluconate (FERGON) 324 MG tablet, Take 324 mg by mouth daily with breakfast., Disp: , Rfl:  .  hydrochlorothiazide (HYDRODIURIL) 25 MG tablet, TAKE 1 TABLET BY MOUTH DAILY, Disp: 90 tablet, Rfl: 0 .  HYDROcodone-acetaminophen (NORCO) 10-325 MG per tablet, Take 1-2 tablets by mouth every 4 (four) hours as needed for moderate pain or severe pain. , Disp: , Rfl:  .  Insulin Glargine (TOUJEO SOLOSTAR) 300 UNIT/ML SOPN, Inject 5 Units into the skin at bedtime., Disp: 1.5 mL, Rfl: 5 .  Insulin Pen Needle (PEN NEEDLES) 31G X 6 MM MISC, Use these for Toujeo pen., Disp: 100 each, Rfl: 2 .  latanoprost (XALATAN) 0.005 % ophthalmic solution, Place 1 drop into the right eye at bedtime., Disp: , Rfl:  .  metFORMIN (GLUCOPHAGE) 1000 MG tablet, Take 1 tablet (1,000 mg total) by mouth 2 (two) times daily with a meal., Disp: 180 tablet, Rfl: 3 .  nortriptyline (PAMELOR) 10 MG capsule, TAKE 1 CAPSULE BY MOUTH AT BEDTIME (Patient taking differently: TAKE 20 MG BY MOUTH AT BEDTIME), Disp: 30 capsule, Rfl: 0 .  tizanidine (ZANAFLEX) 2 MG capsule, Take 2 mg by mouth 3 (three) times daily., Disp: , Rfl:  .  methocarbamol (ROBAXIN) 500 MG tablet, Take 1 tablet (500 mg total) by mouth every 6 (six) hours as needed for muscle spasms. (Patient not  taking: Reported on 02/19/2016), Disp: 60 tablet, Rfl: 1 .  mupirocin ointment (BACTROBAN) 2 %, Apply 1 application topically 2 (two) times daily. Reported on 02/19/2016, Disp: , Rfl:  .  triamcinolone cream (KENALOG) 0.1 %, Apply 1 application topically as needed (for skin irritation). Reported on 02/19/2016, Disp: , Rfl:   Allergies  Allergen Reactions  . Sunflowerseed Oil Hives  . Kiwi Extract Hives  . Tape Other (See Comments)    Pulls skin off, can use paper tape  . Invokana [Canagliflozin] Other (See Comments)    UTI  . Oxycodone Other (See Comments)    Extended release only makes her nauseated.  Can tolerate the immediate release    History reviewed: allergies, current medications, past family history, past medical history, past social history, past surgical history and problem list  Chronic issues discussed: Complaint with medications without c/o regarding diabetes, hypertension, hyperlipidemia, GERD.     Acute issues discussed: When she sweats, she sweats profusely, but always seems to be worse on right side of head and body.  Doesn't seem to be as big of an issue on the left side of her body.   Been noticing this since this past summer.  Denies chest pain.    Sometimes SOB, but attributes this to deconditioning due to the back issues.     Been having a lot of problems with her back.  Since having the spinal stimulator placed, has had lots of back pains, particular at surgery site.   Taking more pain medication than she ever has.   Objective:   Biometrics BP 126/88 mmHg  Pulse 73  Ht 5' 2.25" (1.581 m)  Wt  214 lb (97.07 kg)  BMI 38.83 kg/m2   Cognitive Testing  Alert? Yes  Normal Appearance?Yes  Oriented to person? Yes  Place? Yes   Time? Yes  Recall of three objects?  Yes  Can perform simple calculations? Yes  Displays appropriate judgment?Yes  Can read the correct time from a watch face?Yes  General appearance: alert, no distress, WD/WN, white  female  Nutritional  Status: Inadequate calore intake? no Loss of muscle mass? no Loss of fat beneath skin? no Localized or general edema? no Diminished functional status? no  Other pertinent exam: Midline low back surgical scar, midline mid back surgical scar, right thoracolumbar region with 2 diagonal surgical scars, one from prior lipoma excision, the inferior one from recent spinal stimulator procedure HEENT: normocephalic, sclerae anicteric, TMs pearly, nares patent, no discharge or erythema, pharynx normal Oral cavity: MMM, no lesions, teeth in good repair Neck: supple, no lymphadenopathy, no thyromegaly, no masses, no bruits Heart: RRR, normal S1, S2, no murmurs Lungs: CTA bilaterally, no wheezes, rhonchi, or rales Abdomen: +bs, soft, lower vertical surgical scar mid abdomen, otherwise non tender, non distended, no masses, no hepatomegaly, no splenomegaly Musculoskeletal: mild bony no swelling, no obvious deformity arthritic changes noted of hands bilat, otherwise nonnteder, relatively normal UE and LE ROM without deformity Back: tender midline lumbar region, pain with flexion and extension which is slow Extremities: no edema, no cyanosis, no clubbing Pulses: 1+ symmetric, upper and lower extremities, normal cap refill Neurological: see foot exam, otherwise alert, oriented x 3, CN2-12 intact, strength normal upper extremities and lower extremities, sensation normal throughout, DTRs 2+ throughout, no cerebellar signs, gait normal Psychiatric: normal affect, behavior normal, pleasant   Diabetic Foot Exam - Simple   Simple Foot Form  Diabetic Foot exam was performed with the following findings:  Yes 02/19/2016  5:53 PM  Visual Inspection  No deformities, no ulcerations, no other skin breakdown bilaterally:  Yes  Sensation Testing  See comments:  Yes  Pulse Check  See comments:  Yes  Comments  1+ pedal pulses, decreased sensation with monofilament except for a few of the toes bilat, mostly abnormal exam  though       Assessment:   Encounter Diagnoses  Name Primary?  . Medicare annual wellness visit, subsequent Yes  . Hyperlipidemia   . Elevated liver enzymes   . Spondylolisthesis of lumbar region   . Essential hypertension, benign   . Chronic back pain   . Glaucoma   . Iron deficiency anemia secondary to blood loss (chronic)   . History of uterine cancer   . Type 2 diabetes mellitus with diabetic mononeuropathy, without long-term current use of insulin ([place])   . Prolapse of female bladder, acquired   . Idiopathic peripheral neuropathy ([facility])   . Arthritis   . History of thyroid disease   . Chronic pain   . Lipoma   . Leukocytosis   . Sweating abnormality   . External hemorrhoid   . Gastroesophageal reflux disease without esophagitis      Plan:   A preventative services visit was completed today.  During the course of the visit today, we discussed and counseled about appropriate screening and preventive services.  A health risk assessment was established today that included a review of current medications, allergies, social history, family history, medical and preventative health history, biometrics, and preventative screenings to identify potential safety concerns or impairments.  A personalized plan was printed today for your records and use.   Personalized health  advice and education was given today to reduce health risks and promote self management and wellness.  Information regarding end of life planning was discussed today.  Conditions/risks identified: Pain, sweating, hemorrhoids  Chronic problems discussed today: Encounter Diagnoses  Name Primary?  . Medicare annual wellness visit, subsequent Yes  . Hyperlipidemia   . Elevated liver enzymes   . Spondylolisthesis of lumbar region   . Essential hypertension, benign   . Chronic back pain   . Glaucoma   . Iron deficiency anemia secondary to blood loss (chronic)   . History of uterine cancer   . Type 2 diabetes  mellitus with diabetic mononeuropathy, without long-term current use of insulin ([place])   . Prolapse of female bladder, acquired   . Idiopathic peripheral neuropathy ([facility])   . Arthritis   . History of thyroid disease   . Chronic pain   . Lipoma   . Leukocytosis   . Sweating abnormality   . External hemorrhoid   . Gastroesophageal reflux disease without esophagitis    Hyperlipidemia - c/t Lipitor 40mg  and Aspirin 81mg  daily at bedtime  Diabetes - continue Bydureon 2mg  weekly injection, continue Toujeo long acting insulin 5 units daily at bedtime, continue Metformin 1000 mg twice daily, continue daily foot checks, yearly eye exams for diabetic retinopathy, and continue diabetic diet.    Elevated LFTs - repeat labs today  glaucoma - continue routine follow up with eye doctor  Chronic back pain - continue Cymbalta 60mg  twice daily  Hypertension - continue Enalapril 20mg  daily, continue Hydrochlorothiazide 25mg  daily  history of thyroid disease - labs today  Sweating abnormality - follow up pending labs  Leukocytosis - continue to monitor labs  Anemia - continue Fergon/iron daily  Neuropathy - continue Nortriptyline 10mg  daily at bedtime  Arthritis - c/t Celebrex 200mg  twice daily  GERD - continue Dexilant 60mg  daily in the morning  External hemorrhoid - consider referral to GI for banding procedure   Acute problems discussed today: Sweating - follow up pending labs  Recommendations:  I recommend a yearly ophthalmology/optometry visit for glaucoma screening and eye checkup  I recommended a yearly dental visit for hygiene and checkup  Advanced directives - I recommend you complete a Living Will and [place] forms if you have not done so, or get [place] a copy if you have done this already.  I recommend a screening mammogram every 1-2 years  You are up to date on colonoscopy  You are up to date on bone density screening  Referrals today: None    Immunizations: I recommended a yearly influenza vaccine, typically in September when the vaccine is usually available Is the Pneumococcal vaccine up to date: yes. Is the Shingles vaccine up to date: yes.   Is the Td/Tdap vaccine up to date: no. You are due for Tetanus and diptheria vaccine at this time.   Medicare Attestation A preventative services visit was completed today.  During the course of the visit the patient was educated and counseled about appropriate screening and preventive services.  A health risk assessment was established with the patient that included a review of current medications, allergies, social history, family history, medical and preventative health history, biometrics, and preventative screenings to identify potential safety concerns or impairments.  A personalized plan was printed today for the patient's records and use.   Personalized health advice and education was given today to reduce health risks and promote self management and wellness.  Information regarding end of  life planning was discussed today.  Crisoforo Oxford, PA-C   02/19/2016

## 2016-02-20 ENCOUNTER — Other Ambulatory Visit: Payer: Self-pay | Admitting: Medical

## 2016-02-20 LAB — MICROALBUMIN / CREATININE URINE RATIO
Creatinine, Urine: 119 mg/dL (ref 20–320)
Microalb Creat Ratio: 3 mcg/mg creat (ref <30)
Microalb, Ur: 0.3 mg/dL

## 2016-02-20 MED ORDER — CELECOXIB 200 MG PO CAPS: 200.0000 mg | ORAL_CAPSULE | Freq: Two times a day (BID) | ORAL | Status: DC

## 2016-02-20 MED ORDER — HYDROCHLOROTHIAZIDE 25 MG PO TABS: 25.0000 mg | ORAL_TABLET | Freq: Every day | ORAL | Status: DC

## 2016-02-20 MED ORDER — ASPIRIN EC 81 MG PO TBEC: 81.0000 mg | DELAYED_RELEASE_TABLET | Freq: Every day | ORAL | Status: DC

## 2016-02-20 MED ORDER — INSULIN GLARGINE 300 UNIT/ML ~~LOC~~ SOPN: 10.0000 [IU] | PEN_INJECTOR | Freq: Every day | SUBCUTANEOUS | Status: DC

## 2016-02-20 MED ORDER — DEXLANSOPRAZOLE 60 MG PO CPDR: 60.0000 mg | DELAYED_RELEASE_CAPSULE | Freq: Two times a day (BID) | ORAL | Status: DC

## 2016-02-20 MED ORDER — ATORVASTATIN CALCIUM 40 MG PO TABS: 40.0000 mg | ORAL_TABLET | Freq: Every day | ORAL | Status: DC

## 2016-02-20 MED ORDER — EXENATIDE ER 2 MG ~~LOC~~ PEN: 2.0000 mg | PEN_INJECTOR | SUBCUTANEOUS | Status: DC

## 2016-02-20 MED ORDER — METFORMIN HCL 1000 MG PO TABS: 1000.0000 mg | ORAL_TABLET | Freq: Two times a day (BID) | ORAL | Status: DC

## 2016-02-20 MED ORDER — PEN NEEDLES 31G X 6 MM MISC: Status: DC

## 2016-02-20 MED ORDER — DULOXETINE HCL 60 MG PO CPEP: 60.0000 mg | ORAL_CAPSULE | Freq: Two times a day (BID) | ORAL | Status: DC

## 2016-02-21 ENCOUNTER — Other Ambulatory Visit: Payer: Self-pay | Admitting: Medical

## 2016-02-21 ENCOUNTER — Telehealth: Payer: Self-pay

## 2016-02-21 DIAGNOSIS — L749 Eccrine sweat disorder, unspecified: Secondary | ICD-10-CM

## 2016-02-21 MED ORDER — NORTRIPTYLINE HCL 10 MG PO CAPS: 10.0000 mg | ORAL_CAPSULE | Freq: Every day | ORAL | Status: DC

## 2016-02-21 NOTE — Telephone Encounter (Signed)
-----   Message from Carlena Hurl, PA-C sent at 02/20/2016  4:19 PM EDT ----- Labs overall ok.  I want to bump up her night time insulin/Toujeo to 10 units to get the sugars under better control.   Verify if she is taking Nortriptyline 10mg  1 tabet nightly or 2 tablets nightly?  I refilled all her routine medications.     Regarding sweating, no lab findings to suggest anything.   Thus, the next step may be to update cardiology eval/referral just to make sure things are ok since she does have heart disease risk factors.   See if agreeable?    Mail copy of her AV summary to review  F/u in 75mo for diabetes f/u.

## 2016-02-21 NOTE — Telephone Encounter (Signed)
Referral

## 2016-02-24 DIAGNOSIS — M5417 Radiculopathy, lumbosacral region: Secondary | ICD-10-CM | POA: Diagnosis not present

## 2016-02-24 DIAGNOSIS — Z681 Body mass index (BMI) 19 or less, adult: Secondary | ICD-10-CM | POA: Diagnosis not present

## 2016-02-24 DIAGNOSIS — I10 Essential (primary) hypertension: Secondary | ICD-10-CM | POA: Diagnosis not present

## 2016-02-24 DIAGNOSIS — M961 Postlaminectomy syndrome, not elsewhere classified: Secondary | ICD-10-CM | POA: Diagnosis not present

## 2016-02-28 ENCOUNTER — Other Ambulatory Visit: Payer: Self-pay | Admitting: Neurosurgery

## 2016-03-02 ENCOUNTER — Other Ambulatory Visit (INDEPENDENT_AMBULATORY_CARE_PROVIDER_SITE_OTHER): Payer: Medicare Other

## 2016-03-02 DIAGNOSIS — Z1211 Encounter for screening for malignant neoplasm of colon: Secondary | ICD-10-CM | POA: Diagnosis not present

## 2016-03-02 DIAGNOSIS — Z0000 Encounter for general adult medical examination without abnormal findings: Secondary | ICD-10-CM

## 2016-03-02 LAB — POC HEMOCCULT BLD/STL (HOME/3-CARD/SCREEN)
Card #2 Fecal Occult Blod, POC: NEGATIVE
Card #3 Fecal Occult Blood, POC: NEGATIVE
Fecal Occult Blood, POC: NEGATIVE

## 2016-03-05 ENCOUNTER — Other Ambulatory Visit: Payer: Self-pay | Admitting: Medical

## 2016-03-06 NOTE — Telephone Encounter (Signed)
Is this ok to refill?  

## 2016-03-09 ENCOUNTER — Ambulatory Visit: Payer: Medicare Other | Admitting: Cardiovascular Disease

## 2016-03-10 ENCOUNTER — Encounter (HOSPITAL_COMMUNITY): Payer: Self-pay

## 2016-03-10 ENCOUNTER — Encounter (HOSPITAL_COMMUNITY)
Admission: RE | Admit: 2016-03-10 | Discharge: 2016-03-10 | Disposition: A | Discharge status: Home / Self Care | Payer: Medicare Other | Source: Ambulatory Visit | Attending: Neurosurgery | Admitting: Neurosurgery

## 2016-03-10 ENCOUNTER — Other Ambulatory Visit (HOSPITAL_COMMUNITY): Payer: Self-pay | Admitting: *Deleted

## 2016-03-10 DIAGNOSIS — I10 Essential (primary) hypertension: Secondary | ICD-10-CM | POA: Diagnosis not present

## 2016-03-10 DIAGNOSIS — Z7984 Long term (current) use of oral hypoglycemic drugs: Secondary | ICD-10-CM | POA: Diagnosis not present

## 2016-03-10 DIAGNOSIS — Z79899 Other long term (current) drug therapy: Secondary | ICD-10-CM | POA: Diagnosis not present

## 2016-03-10 DIAGNOSIS — J45909 Unspecified asthma, uncomplicated: Secondary | ICD-10-CM | POA: Diagnosis not present

## 2016-03-10 DIAGNOSIS — M961 Postlaminectomy syndrome, not elsewhere classified: Secondary | ICD-10-CM | POA: Diagnosis not present

## 2016-03-10 DIAGNOSIS — E119 Type 2 diabetes mellitus without complications: Secondary | ICD-10-CM | POA: Diagnosis not present

## 2016-03-10 DIAGNOSIS — G8929 Other chronic pain: Secondary | ICD-10-CM | POA: Diagnosis not present

## 2016-03-10 DIAGNOSIS — K219 Gastro-esophageal reflux disease without esophagitis: Secondary | ICD-10-CM | POA: Diagnosis not present

## 2016-03-10 DIAGNOSIS — E039 Hypothyroidism, unspecified: Secondary | ICD-10-CM | POA: Diagnosis not present

## 2016-03-10 LAB — BASIC METABOLIC PANEL
Anion gap: 9 (ref 5–15)
BUN: 24 mg/dL — ABNORMAL HIGH (ref 6–20)
CO2: 26 mmol/L (ref 22–32)
Calcium: 9.7 mg/dL (ref 8.9–10.3)
Chloride: 99 mmol/L — ABNORMAL LOW (ref 101–111)
Creatinine, Ser: 0.73 mg/dL (ref 0.44–1.00)
GFR calc Af Amer: >60 mL/min (ref >60)
GFR calc non Af Amer: >60 mL/min (ref >60)
Glucose, Bld: 164 mg/dL — ABNORMAL HIGH (ref 65–99)
Potassium: 4.4 mmol/L (ref 3.5–5.1)
Sodium: 134 mmol/L — ABNORMAL LOW (ref 135–145)

## 2016-03-10 LAB — CBC
HCT: 40.8 % (ref 36.0–46.0)
Hemoglobin: 13.2 g/dL (ref 12.0–15.0)
MCH: 27.3 pg (ref 26.0–34.0)
MCHC: 32.4 g/dL (ref 30.0–36.0)
MCV: 84.5 fL (ref 78.0–100.0)
Platelets: 339 10*3/uL (ref 150–400)
RBC: 4.83 MIL/uL (ref 3.87–5.11)
RDW: 12.2 % (ref 11.5–15.5)
WBC: 11.8 10*3/uL — ABNORMAL HIGH (ref 4.0–10.5)

## 2016-03-10 LAB — SURGICAL PCR SCREEN
MRSA, PCR: NEGATIVE
Staphylococcus aureus: NEGATIVE

## 2016-03-10 LAB — GLUCOSE, CAPILLARY: Glucose-Capillary: 186 mg/dL — ABNORMAL HIGH (ref 65–99)

## 2016-03-10 NOTE — Pre-Procedure Instructions (Signed)
Robyn Hill  03/10/2016      [facility] [place] [address] [place] [id number] [place] [id number] Phone: [phone] Fax: [facility] [id number] - [place], [place] [address] [place] [address] Phone: [phone] Fax: [phone]    Your procedure is scheduled on 03-12-2016  Thursday   Report to [facility] Admitting at 11:30 A.M.   Call this number if you have problems the morning of surgery:  [phone]   Remember:  Do not eat food or drink liquids after midnight.   Take these medicines the morning of surgery with A SIP OF WATER Dexilant ,Eye drops as ordered by MD,pain medication if needed,tizanidine(Zanaflex),                                                       How to Manage Your Diabetes Before and After Surgery  Why is it important to control my blood sugar before and after surgery? . Improving blood sugar levels before and after surgery helps healing and can limit problems. . A way of improving blood sugar control is eating a healthy diet by: o  Eating less sugar and carbohydrates o  Increasing activity/exercise o  Talking with your doctor about reaching your blood sugar goals . High blood sugars (greater than 180 mg/dL) can raise your risk of infections and slow your recovery, so you will need to focus on controlling your diabetes during the weeks before surgery. . Make sure that the doctor who takes care of your diabetes knows about your planned surgery including the date and location.  How do I manage my blood sugar before surgery? . Check your blood sugar at least 4 times a day, starting 2 days before surgery, to make sure that the level is not too high or low. o Check your blood sugar the morning of your surgery when you wake up and every 2 hours until you get to the Short Stay unit. . If your blood sugar is less than 70 mg/dL, you  will need to treat for low blood sugar: o Do not take insulin. o Treat a low blood sugar (less than 70 mg/dL) with  cup of clear juice (cranberry or apple), 4 glucose tablets, OR glucose gel. o Recheck blood sugar in 15 minutes after treatment (to make sure it is greater than 70 mg/dL). If your blood sugar is not greater than 70 mg/dL on recheck, call [phone] for further instructions. . Report your blood sugar to the short stay nurse when you get to Short Stay.  . If you are admitted to the hospital after surgery: o Your blood sugar will be checked by the staff and you will probably be given insulin after surgery (instead of oral diabetes medicines) to make sure you have good blood sugar levels. o The goal for blood sugar control after surgery is 80-180 mg/dL.              WHAT DO I DO ABOUT MY DIABETES MEDICATION?   Marland Kitchen Do not take oral diabetes medicines (pills) the morning of surgery.  . THE NIGHT BEFORE SURGERY, take _____5______ units of Toujeo insulin.          Patient  Signature:  Date:   Nurse Signature:  Date:   Reviewed and Endorsed by [facility] Patient Education Committee, August 2015   Do not wear jewelry, make-up or nail polish.  Do not wear lotions, powders, or perfumes.  You may NOT wear deodorant.  Do not shave 48 hours prior to surgery.     Do not bring valuables to the hospital.  [facility] is not responsible for any belongings or valuables.  Contacts, dentures or bridgework may not be worn into surgery.  Leave your suitcase in the car.  After surgery it may be brought to your room.  For patients admitted to the hospital, discharge time will be determined by your treatment team.  Patients discharged the day of surgery will not be allowed to drive home.    Special instructions:  See attached Sheet for instructions on CHG showers  Please read over the following fact sheets that you were given. Surgical Site Infection Prevention

## 2016-03-12 ENCOUNTER — Encounter (HOSPITAL_COMMUNITY): Payer: Self-pay | Admitting: *Deleted

## 2016-03-12 ENCOUNTER — Ambulatory Visit (HOSPITAL_COMMUNITY): Payer: Medicare Other | Admitting: Anesthesiology

## 2016-03-12 ENCOUNTER — Observation Stay (HOSPITAL_COMMUNITY)
Admission: RE | Admit: 2016-03-12 | Discharge: 2016-03-13 | Disposition: A | Discharge status: Home / Self Care | Payer: Medicare Other | Source: Ambulatory Visit | Attending: Neurosurgery | Admitting: Neurosurgery

## 2016-03-12 ENCOUNTER — Encounter (HOSPITAL_COMMUNITY)
Admission: RE | Disposition: A | Discharge status: Home / Self Care | Payer: Self-pay | Source: Ambulatory Visit | Attending: Neurosurgery

## 2016-03-12 DIAGNOSIS — G8929 Other chronic pain: Secondary | ICD-10-CM | POA: Diagnosis not present

## 2016-03-12 DIAGNOSIS — I10 Essential (primary) hypertension: Secondary | ICD-10-CM | POA: Insufficient documentation

## 2016-03-12 DIAGNOSIS — Z79899 Other long term (current) drug therapy: Secondary | ICD-10-CM | POA: Insufficient documentation

## 2016-03-12 DIAGNOSIS — E039 Hypothyroidism, unspecified: Secondary | ICD-10-CM | POA: Insufficient documentation

## 2016-03-12 DIAGNOSIS — J45909 Unspecified asthma, uncomplicated: Secondary | ICD-10-CM | POA: Diagnosis not present

## 2016-03-12 DIAGNOSIS — T85840A Pain due to nervous system prosthetic devices, implants and grafts, initial encounter: Secondary | ICD-10-CM | POA: Diagnosis not present

## 2016-03-12 DIAGNOSIS — K219 Gastro-esophageal reflux disease without esophagitis: Secondary | ICD-10-CM | POA: Insufficient documentation

## 2016-03-12 DIAGNOSIS — M549 Dorsalgia, unspecified: Secondary | ICD-10-CM | POA: Diagnosis not present

## 2016-03-12 DIAGNOSIS — Z7984 Long term (current) use of oral hypoglycemic drugs: Secondary | ICD-10-CM | POA: Insufficient documentation

## 2016-03-12 DIAGNOSIS — E119 Type 2 diabetes mellitus without complications: Secondary | ICD-10-CM | POA: Diagnosis not present

## 2016-03-12 DIAGNOSIS — M961 Postlaminectomy syndrome, not elsewhere classified: Principal | ICD-10-CM | POA: Insufficient documentation

## 2016-03-12 DIAGNOSIS — T8484XA Pain due to internal orthopedic prosthetic devices, implants and grafts, initial encounter: Secondary | ICD-10-CM | POA: Diagnosis not present

## 2016-03-12 HISTORY — PX: PULSE GENERATOR IMPLANT: SHX5370

## 2016-03-12 HISTORY — DX: Personal history of urinary calculi: Z87.442

## 2016-03-12 LAB — GLUCOSE, CAPILLARY
Glucose-Capillary: 140 mg/dL — ABNORMAL HIGH (ref 65–99)
Glucose-Capillary: 141 mg/dL — ABNORMAL HIGH (ref 65–99)
Glucose-Capillary: 166 mg/dL — ABNORMAL HIGH (ref 65–99)

## 2016-03-12 SURGERY — UNILATERAL PULSE GENERATOR IMPLANT
Anesthesia: General | Site: Back | Laterality: Right

## 2016-03-12 MED ORDER — SODIUM CHLORIDE 0.9% FLUSH: 3.0000 mL | INTRAVENOUS | Status: DC | PRN

## 2016-03-12 MED ORDER — ENALAPRIL MALEATE 20 MG PO TABS
20.0000 mg | ORAL_TABLET | Freq: Every day | ORAL | Status: DC
  Administered 2016-03-12: 20 mg via ORAL
  Filled 2016-03-12 (×2): qty 1

## 2016-03-12 MED ORDER — CEFAZOLIN SODIUM-DEXTROSE 2-4 GM/100ML-% IV SOLN
2.0000 g | INTRAVENOUS | Status: AC
  Administered 2016-03-12: 2 g via INTRAVENOUS

## 2016-03-12 MED ORDER — FENTANYL CITRATE (PF) 100 MCG/2ML IJ SOLN
INTRAMUSCULAR | Status: AC
  Administered 2016-03-12: 50 ug via INTRAVENOUS
  Filled 2016-03-12: qty 2

## 2016-03-12 MED ORDER — PROPOFOL 10 MG/ML IV BOLUS
INTRAVENOUS | Status: DC | PRN
  Administered 2016-03-12: 60 mg via INTRAVENOUS
  Administered 2016-03-12: 140 mg via INTRAVENOUS

## 2016-03-12 MED ORDER — CEFAZOLIN SODIUM-DEXTROSE 2-4 GM/100ML-% IV SOLN
INTRAVENOUS | Status: AC
  Filled 2016-03-12: qty 100

## 2016-03-12 MED ORDER — SUGAMMADEX SODIUM 200 MG/2ML IV SOLN
INTRAVENOUS | Status: AC
  Filled 2016-03-12: qty 2

## 2016-03-12 MED ORDER — MIDAZOLAM HCL 2 MG/2ML IJ SOLN
INTRAMUSCULAR | Status: AC
  Filled 2016-03-12: qty 2

## 2016-03-12 MED ORDER — ROCURONIUM BROMIDE 100 MG/10ML IV SOLN
INTRAVENOUS | Status: DC | PRN
  Administered 2016-03-12: 50 mg via INTRAVENOUS

## 2016-03-12 MED ORDER — HYDROCHLOROTHIAZIDE 25 MG PO TABS
25.0000 mg | ORAL_TABLET | Freq: Every day | ORAL | Status: DC
  Administered 2016-03-12: 25 mg via ORAL
  Filled 2016-03-12: qty 1

## 2016-03-12 MED ORDER — KCL IN DEXTROSE-NACL 20-5-0.45 MEQ/L-%-% IV SOLN
INTRAVENOUS | Status: DC
Start: 2016-03-12 — End: 2016-03-13

## 2016-03-12 MED ORDER — 0.9 % SODIUM CHLORIDE (POUR BTL) OPTIME
TOPICAL | Status: DC | PRN
  Administered 2016-03-12: 1000 mL

## 2016-03-12 MED ORDER — HYDROCODONE-ACETAMINOPHEN 5-325 MG PO TABS
1.0000 | ORAL_TABLET | ORAL | Status: DC | PRN
  Filled 2016-03-12 (×2): qty 1

## 2016-03-12 MED ORDER — PANTOPRAZOLE SODIUM 40 MG IV SOLR
40.0000 mg | Freq: Every day | INTRAVENOUS | Status: AC
  Administered 2016-03-12: 40 mg via INTRAVENOUS
  Filled 2016-03-12: qty 40

## 2016-03-12 MED ORDER — TRIAMCINOLONE ACETONIDE 0.1 % EX OINT
1.0000 "application " | TOPICAL_OINTMENT | CUTANEOUS | Status: DC | PRN
  Filled 2016-03-12: qty 15

## 2016-03-12 MED ORDER — ONDANSETRON HCL 4 MG/2ML IJ SOLN
INTRAMUSCULAR | Status: AC
  Filled 2016-03-12: qty 2

## 2016-03-12 MED ORDER — BISACODYL 10 MG RE SUPP: 10.0000 mg | Freq: Every day | RECTAL | Status: DC | PRN

## 2016-03-12 MED ORDER — TIZANIDINE HCL 2 MG PO TABS
2.0000 mg | ORAL_TABLET | Freq: Three times a day (TID) | ORAL | Status: DC
  Administered 2016-03-12: 2 mg via ORAL
  Filled 2016-03-12 (×2): qty 1

## 2016-03-12 MED ORDER — LACTATED RINGERS IV SOLN
INTRAVENOUS | Status: DC
  Administered 2016-03-12: 12:00:00 via INTRAVENOUS

## 2016-03-12 MED ORDER — FENTANYL CITRATE (PF) 100 MCG/2ML IJ SOLN
INTRAMUSCULAR | Status: DC | PRN
  Administered 2016-03-12 (×3): 50 ug via INTRAVENOUS
  Administered 2016-03-12: 100 ug via INTRAVENOUS

## 2016-03-12 MED ORDER — MEPERIDINE HCL 25 MG/ML IJ SOLN: 6.2500 mg | INTRAMUSCULAR | Status: DC | PRN

## 2016-03-12 MED ORDER — PANTOPRAZOLE SODIUM 40 MG PO TBEC: 40.0000 mg | DELAYED_RELEASE_TABLET | Freq: Every day | ORAL | Status: DC

## 2016-03-12 MED ORDER — MENTHOL 3 MG MT LOZG: 1.0000 | LOZENGE | OROMUCOSAL | Status: DC | PRN

## 2016-03-12 MED ORDER — METHOCARBAMOL 500 MG PO TABS: 500.0000 mg | ORAL_TABLET | Freq: Four times a day (QID) | ORAL | Status: DC | PRN

## 2016-03-12 MED ORDER — FENTANYL CITRATE (PF) 100 MCG/2ML IJ SOLN
25.0000 ug | INTRAMUSCULAR | Status: DC | PRN
  Administered 2016-03-12 (×3): 50 ug via INTRAVENOUS

## 2016-03-12 MED ORDER — DORZOLAMIDE HCL-TIMOLOL MAL 2-0.5 % OP SOLN
1.0000 [drp] | Freq: Two times a day (BID) | OPHTHALMIC | Status: DC
  Administered 2016-03-12: 1 [drp] via OPHTHALMIC
  Filled 2016-03-12: qty 10

## 2016-03-12 MED ORDER — MUPIROCIN 2 % EX OINT: 1.0000 "application " | TOPICAL_OINTMENT | Freq: Two times a day (BID) | CUTANEOUS | Status: DC | PRN

## 2016-03-12 MED ORDER — MIDAZOLAM HCL 5 MG/5ML IJ SOLN
INTRAMUSCULAR | Status: DC | PRN
  Administered 2016-03-12: 2 mg via INTRAVENOUS

## 2016-03-12 MED ORDER — LACTATED RINGERS IV SOLN
INTRAVENOUS | Status: DC | PRN
  Administered 2016-03-12 (×2): via INTRAVENOUS

## 2016-03-12 MED ORDER — METOCLOPRAMIDE HCL 5 MG/ML IJ SOLN: 10.0000 mg | Freq: Once | INTRAMUSCULAR | Status: DC | PRN

## 2016-03-12 MED ORDER — NORTRIPTYLINE HCL 10 MG PO CAPS
10.0000 mg | ORAL_CAPSULE | Freq: Every day | ORAL | Status: DC
  Administered 2016-03-12: 10 mg via ORAL
  Filled 2016-03-12: qty 1

## 2016-03-12 MED ORDER — ONDANSETRON HCL 4 MG/2ML IJ SOLN
INTRAMUSCULAR | Status: DC | PRN
  Administered 2016-03-12: 4 mg via INTRAVENOUS

## 2016-03-12 MED ORDER — SUGAMMADEX SODIUM 200 MG/2ML IV SOLN
INTRAVENOUS | Status: DC | PRN
  Administered 2016-03-12: 200 mg via INTRAVENOUS

## 2016-03-12 MED ORDER — CELECOXIB 200 MG PO CAPS
200.0000 mg | ORAL_CAPSULE | Freq: Two times a day (BID) | ORAL | Status: DC
  Administered 2016-03-12: 200 mg via ORAL
  Filled 2016-03-12: qty 1

## 2016-03-12 MED ORDER — FLEET ENEMA 7-19 GM/118ML RE ENEM: 1.0000 | ENEMA | Freq: Once | RECTAL | Status: DC | PRN

## 2016-03-12 MED ORDER — PHENOL 1.4 % MT LIQD: 1.0000 | OROMUCOSAL | Status: DC | PRN

## 2016-03-12 MED ORDER — DOCUSATE SODIUM 100 MG PO CAPS
100.0000 mg | ORAL_CAPSULE | Freq: Two times a day (BID) | ORAL | Status: DC
  Administered 2016-03-12: 100 mg via ORAL
  Filled 2016-03-12: qty 1

## 2016-03-12 MED ORDER — EXENATIDE ER 2 MG ~~LOC~~ PEN: 2.0000 mg | PEN_INJECTOR | SUBCUTANEOUS | Status: DC

## 2016-03-12 MED ORDER — LIDOCAINE-EPINEPHRINE 1 %-1:100000 IJ SOLN
INTRAMUSCULAR | Status: DC | PRN
  Administered 2016-03-12: 5 mL

## 2016-03-12 MED ORDER — ACETAMINOPHEN 650 MG RE SUPP: 650.0000 mg | RECTAL | Status: DC | PRN

## 2016-03-12 MED ORDER — ZOLPIDEM TARTRATE 5 MG PO TABS: 5.0000 mg | ORAL_TABLET | Freq: Every evening | ORAL | Status: DC | PRN

## 2016-03-12 MED ORDER — ARTIFICIAL TEARS OP OINT
TOPICAL_OINTMENT | OPHTHALMIC | Status: DC | PRN
  Administered 2016-03-12: 1 via OPHTHALMIC

## 2016-03-12 MED ORDER — SENNOSIDES-DOCUSATE SODIUM 8.6-50 MG PO TABS: 1.0000 | ORAL_TABLET | Freq: Every evening | ORAL | Status: DC | PRN

## 2016-03-12 MED ORDER — PANTOPRAZOLE SODIUM 40 MG IV SOLR: 40.0000 mg | Freq: Every day | INTRAVENOUS | Status: DC

## 2016-03-12 MED ORDER — ESMOLOL HCL 100 MG/10ML IV SOLN
INTRAVENOUS | Status: DC | PRN
  Administered 2016-03-12: 40 mg via INTRAVENOUS
  Administered 2016-03-12: 30 mg via INTRAVENOUS

## 2016-03-12 MED ORDER — LATANOPROST 0.005 % OP SOLN
1.0000 [drp] | Freq: Every day | OPHTHALMIC | Status: DC
  Administered 2016-03-12: 1 [drp] via OPHTHALMIC
  Filled 2016-03-12: qty 2.5

## 2016-03-12 MED ORDER — ONDANSETRON HCL 4 MG/2ML IJ SOLN: 4.0000 mg | INTRAMUSCULAR | Status: DC | PRN

## 2016-03-12 MED ORDER — LIDOCAINE HCL (CARDIAC) 20 MG/ML IV SOLN
INTRAVENOUS | Status: DC | PRN
  Administered 2016-03-12: 100 mg via INTRAVENOUS

## 2016-03-12 MED ORDER — VANCOMYCIN HCL 1000 MG IV SOLR
INTRAVENOUS | Status: AC
  Filled 2016-03-12: qty 1000

## 2016-03-12 MED ORDER — CEFAZOLIN SODIUM 1-5 GM-% IV SOLN
1.0000 g | Freq: Three times a day (TID) | INTRAVENOUS | Status: AC
  Administered 2016-03-12 – 2016-03-13 (×2): 1 g via INTRAVENOUS
  Filled 2016-03-12 (×2): qty 50

## 2016-03-12 MED ORDER — SODIUM CHLORIDE 0.9% FLUSH
3.0000 mL | Freq: Two times a day (BID) | INTRAVENOUS | Status: DC
Start: 2016-03-12 — End: 2016-03-13

## 2016-03-12 MED ORDER — METFORMIN HCL 500 MG PO TABS
1000.0000 mg | ORAL_TABLET | Freq: Two times a day (BID) | ORAL | Status: DC
Start: 2016-03-13 — End: 2016-03-13

## 2016-03-12 MED ORDER — HYDROMORPHONE HCL 1 MG/ML IJ SOLN: 0.5000 mg | INTRAMUSCULAR | Status: DC | PRN

## 2016-03-12 MED ORDER — INSULIN GLARGINE 100 UNIT/ML ~~LOC~~ SOLN
10.0000 [IU] | Freq: Every day | SUBCUTANEOUS | Status: DC
  Administered 2016-03-12: 10 [IU] via SUBCUTANEOUS
  Filled 2016-03-12 (×2): qty 0.1

## 2016-03-12 MED ORDER — VANCOMYCIN HCL 1000 MG IV SOLR
INTRAVENOUS | Status: DC | PRN
  Administered 2016-03-12: 1000 mg

## 2016-03-12 MED ORDER — ASPIRIN EC 81 MG PO TBEC: 81.0000 mg | DELAYED_RELEASE_TABLET | Freq: Every day | ORAL | Status: DC

## 2016-03-12 MED ORDER — ACETAMINOPHEN 325 MG PO TABS: 650.0000 mg | ORAL_TABLET | ORAL | Status: DC | PRN

## 2016-03-12 MED ORDER — ALUM & MAG HYDROXIDE-SIMETH 200-200-20 MG/5ML PO SUSP: 30.0000 mL | Freq: Four times a day (QID) | ORAL | Status: DC | PRN

## 2016-03-12 MED ORDER — DULOXETINE HCL 30 MG PO CPEP
60.0000 mg | ORAL_CAPSULE | Freq: Two times a day (BID) | ORAL | Status: DC
  Administered 2016-03-12: 60 mg via ORAL
  Filled 2016-03-12: qty 2

## 2016-03-12 MED ORDER — HYDROCODONE-ACETAMINOPHEN 5-325 MG PO TABS
1.0000 | ORAL_TABLET | ORAL | Status: DC | PRN
  Administered 2016-03-12 – 2016-03-13 (×4): 1 via ORAL

## 2016-03-12 MED ORDER — METHOCARBAMOL 1000 MG/10ML IJ SOLN
500.0000 mg | Freq: Four times a day (QID) | INTRAVENOUS | Status: DC | PRN
  Filled 2016-03-12: qty 5

## 2016-03-12 MED ORDER — FERROUS GLUCONATE 324 (38 FE) MG PO TABS
324.0000 mg | ORAL_TABLET | Freq: Every day | ORAL | Status: DC
  Filled 2016-03-12: qty 1

## 2016-03-12 MED ORDER — VITAMIN D 1000 UNITS PO TABS: 1000.0000 [IU] | ORAL_TABLET | Freq: Every morning | ORAL | Status: DC

## 2016-03-12 MED ORDER — HYDROCODONE-ACETAMINOPHEN 5-325 MG PO TABS
ORAL_TABLET | ORAL | Status: AC
  Administered 2016-03-12: 1 via ORAL
  Filled 2016-03-12: qty 1

## 2016-03-12 MED ORDER — SODIUM CHLORIDE 0.9 % IV SOLN: 250.0000 mL | INTRAVENOUS | Status: DC

## 2016-03-12 MED ORDER — ARTIFICIAL TEARS OP OINT
TOPICAL_OINTMENT | OPHTHALMIC | Status: AC
  Filled 2016-03-12: qty 3.5

## 2016-03-12 MED ORDER — FENTANYL CITRATE (PF) 250 MCG/5ML IJ SOLN
INTRAMUSCULAR | Status: AC
  Filled 2016-03-12: qty 5

## 2016-03-12 MED ORDER — BUPIVACAINE HCL (PF) 0.5 % IJ SOLN
INTRAMUSCULAR | Status: DC | PRN
  Administered 2016-03-12: 5 mL

## 2016-03-12 MED ORDER — HYDROCODONE-ACETAMINOPHEN 10-325 MG PO TABS: 1.0000 | ORAL_TABLET | ORAL | Status: DC | PRN

## 2016-03-12 MED ORDER — ATORVASTATIN CALCIUM 20 MG PO TABS
40.0000 mg | ORAL_TABLET | Freq: Every day | ORAL | Status: DC
  Administered 2016-03-12: 40 mg via ORAL
  Filled 2016-03-12: qty 2

## 2016-03-12 SURGICAL SUPPLY — 46 items
BANDAGE ADH SHEER 1  50/CT (GAUZE/BANDAGES/DRESSINGS) IMPLANT
BENZOIN TINCTURE PRP APPL 2/3 (GAUZE/BANDAGES/DRESSINGS) IMPLANT
BLADE CLIPPER SURG (BLADE) IMPLANT
BLADE SURG 10 STRL SS (BLADE) ×3 IMPLANT
BLADE SURG 11 STRL SS (BLADE) ×6 IMPLANT
BLADE SURG 15 STRL LF DISP TIS (BLADE) ×1 IMPLANT
BLADE SURG 15 STRL SS (BLADE) ×2
BOOT SUTURE AID YELLOW STND (SUTURE) IMPLANT
CANISTER SUCT 3000ML PPV (MISCELLANEOUS) ×3 IMPLANT
CLIP RANEY DISP (INSTRUMENTS) IMPLANT
CORDS BIPOLAR (ELECTRODE) ×3 IMPLANT
COVER BACK TABLE 60X90IN (DRAPES) ×3 IMPLANT
COVER MAYO STAND STRL (DRAPES) ×3 IMPLANT
DECANTER SPIKE VIAL GLASS SM (MISCELLANEOUS) ×3 IMPLANT
DERMABOND ADVANCED (GAUZE/BANDAGES/DRESSINGS) ×4
DERMABOND ADVANCED .7 DNX12 (GAUZE/BANDAGES/DRESSINGS) ×2 IMPLANT
DRAPE C-ARM 42X72 X-RAY (DRAPES) IMPLANT
DRAPE INCISE IOBAN 66X45 STRL (DRAPES) ×3 IMPLANT
DRSG OPSITE POSTOP 3X4 (GAUZE/BANDAGES/DRESSINGS) ×6 IMPLANT
DURAPREP 26ML APPLICATOR (WOUND CARE) ×3 IMPLANT
ELECT CAUTERY BLADE 6.4 (BLADE) ×3 IMPLANT
GAUZE SPONGE 4X4 12PLY STRL (GAUZE/BANDAGES/DRESSINGS) IMPLANT
GAUZE SPONGE 4X4 16PLY XRAY LF (GAUZE/BANDAGES/DRESSINGS) ×3 IMPLANT
GLOVE BIO SURGEON STRL SZ8 (GLOVE) ×3 IMPLANT
GLOVE ECLIPSE 7.5 STRL STRAW (GLOVE) ×3 IMPLANT
GLOVE INDICATOR 8.0 STRL GRN (GLOVE) ×3 IMPLANT
GLOVE INDICATOR 8.5 STRL (GLOVE) ×3 IMPLANT
KIT BASIN OR (CUSTOM PROCEDURE TRAY) ×3 IMPLANT
MARKER SKIN DUAL TIP RULER LAB (MISCELLANEOUS) ×6 IMPLANT
NEEDLE HYPO 25X1 1.5 SAFETY (NEEDLE) ×3 IMPLANT
NEEDLE SPNL 18GX3.5 QUINCKE PK (NEEDLE) IMPLANT
NEEDLE SPNL 22GX3.5 QUINCKE BK (NEEDLE) IMPLANT
PACK EENT II TURBAN DRAPE (CUSTOM PROCEDURE TRAY) ×3 IMPLANT
PAD ARMBOARD 7.5X6 YLW CONV (MISCELLANEOUS) ×6 IMPLANT
PATTIES SURGICAL 1X1 (DISPOSABLE) IMPLANT
PENCIL BUTTON HOLSTER BLD 10FT (ELECTRODE) ×3 IMPLANT
STAPLER SKIN PROX WIDE 3.9 (STAPLE) ×3 IMPLANT
SUT BONE WAX W31G (SUTURE) ×3 IMPLANT
SUT ETHILON 3 0 PS 1 (SUTURE) IMPLANT
SUT VIC AB 2-0 CP2 18 (SUTURE) ×3 IMPLANT
SYR BULB 3OZ (MISCELLANEOUS) ×3 IMPLANT
SYR CONTROL 10ML LL (SYRINGE) ×3 IMPLANT
TOOL LONG TUNNEL (SPINAL CORD STIMULATOR) ×3 IMPLANT
TUBE CONNECTING 12'X1/4 (SUCTIONS) ×1
TUBE CONNECTING 12X1/4 (SUCTIONS) ×2 IMPLANT
WRENCH HEX 4.3 (SPINAL CORD STIMULATOR) ×3 IMPLANT

## 2016-03-12 NOTE — Anesthesia Procedure Notes (Signed)
Procedure Name: Intubation Date/Time: 03/12/2016 3:12 PM Performed by: Jacquiline Doe A Pre-anesthesia Checklist: Patient identified, Timeout performed, Emergency Drugs available, Suction available and Patient being monitored Patient Re-evaluated:Patient Re-evaluated prior to inductionOxygen Delivery Method: Circle system utilized Preoxygenation: Pre-oxygenation with 100% oxygen Intubation Type: IV induction and Cricoid Pressure applied Ventilation: Mask ventilation without difficulty Laryngoscope Size: Mac and 4 Grade View: Grade I Tube type: Oral Tube size: 7.5 mm Number of attempts: 1 Airway Equipment and Method: Stylet Placement Confirmation: ETT inserted through vocal cords under direct vision,  breath sounds checked- equal and bilateral and positive ETCO2 Secured at: 22 cm Tube secured with: Tape Dental Injury: Teeth and Oropharynx as per pre-operative assessment

## 2016-03-12 NOTE — Interval H&P Note (Signed)
History and Physical Interval Note:  03/12/2016 12:57 PM  Robyn Hill  has presented today for surgery, with the diagnosis of Lumbar post laminectomy syndrome  The various methods of treatment have been discussed with the patient and family. After consideration of risks, benefits and other options for treatment, the patient has consented to  Procedure(s) with comments: Right Reposition of implantable pulse generator (Right) - Right Reposition of implantable pulse generator as a surgical intervention .  The patient's history has been reviewed, patient examined, no change in status, stable for surgery.  I have reviewed the patient's chart and labs.  Questions were answered to the patient's satisfaction.     Teofilo Lupinacci D

## 2016-03-12 NOTE — Discharge Instructions (Signed)
Wound Care Leave incision open to air. You may shower. Do not scrub directly on incision.  Do not put any creams, lotions, or ointments on incision. Activity Walk each and every day, increasing distance each day. No lifting greater than 5 lbs.  Avoid bending, arching, and twisting.  Diet Resume your normal diet.  Return to Work Will be discussed at you follow up appointment. Call Your Doctor If Any of These Occur Redness, drainage, or swelling at the wound.  Temperature greater than 101 degrees. Severe pain not relieved by pain medication. Incision starts to come apart. Follow Up Appt Call today for appointment in [phone]) or for problems.  If you have any hardware placed in your spine, you will need an x-ray before your appointment.

## 2016-03-12 NOTE — Progress Notes (Signed)
Patient ID: Robyn Hill, female   DOB: 12-31-1945, 70 y.o.   MRN: [id number] Waking up, conversant. MAEW. Reports significant incisional pain right lumbar region. Drsgs intact, without swelling or drainage. Reassured. Will reassess.   Verdis Prime RN BSN

## 2016-03-12 NOTE — H&P (Signed)
Patient ID:   [id number] Patient: Robyn Hill  Date of Birth: 04/16/1946 Visit Type: Office Visit   Date: 02/24/2016 12:00 PM Provider: Marchia Meiers. Vertell Limber MD   This 70 year old female presents for back pain.  History of Present Illness: 1.  back pain    Patient returns to discuss pain over the SCS IPG site.  She reports good stim coverage over the left lumbar and left leg.  Fair right leg coverage; with rib stimulation at higher settings.  IPG site is without palpable swelling, and without erythema.  She has increased her Norco 10/325 to every 3 hours instead of every 6 hours.  Patient has pain to palpation over her 12th rib on the right side.      Medical/Surgical/Interim History Reviewed, no change.   PAST MEDICAL HISTORY, SURGICAL HISTORY, FAMILY HISTORY, SOCIAL HISTORY AND REVIEW OF SYSTEMS I have reviewed the patient's past medical, surgical, family and social history as well as the comprehensive review of systems as included on the [facility] history form dated 07/23/2015, which I have signed.  Family History: Reviewed, no changes.  Last detailed document: 08/07/2013.   Social History: Tobacco use reviewed. Reviewed, no changes. Last detailed document date: 08/07/2013.      MEDICATIONS(added, continued or stopped this visit): Started Medication Directions Instruction Stopped   atorvastatin 40 mg tablet take 1 tablet by oral route  every day     Bydureon 2 mg/0.65 mL subcutaneous pen injector inject 0.65 milliliter by subcutaneous route  every 7 days in the abdomen, thighs, or outer area of upper arm rotating injectionsites     Cymbalta 60 mg capsule,delayed release take 1 capsule by oral route 2 times every day     Dexilant 60 mg capsule, delayed release take 1 capsule by oral route 2 times every day for 8 weeks     DORZOLAMIDE HCL/TIMOLOL MALEAT instill 1 drop by ophthalmic route 2 times every day into affected eye(s)     enalapril  maleate 20 mg tablet take 1 tablet by oral route  every day     ferrous gluconate 324 mg (36 mg iron) tablet Take 1 tablet daily with breakfast     hydrochlorothiazide 25 mg tablet take 1 tablet by oral route  every day     metformin 1,000 mg tablet take 1 tablet by oral route 2 times every day with morning and evening meals     mupirocin 2 % topical ointment 1 application twice daily as needed for infection of sores on legs/arms    02/12/2016 Norco 10 mg-325 mg tablet take 1 tablet by oral route  every 6 hours as needed for pain (DNF 11/14/15)    01/13/2016 nortriptyline 10 mg capsule TAKE 1 TO 2 CAPSULE BY ORAL ROUTE EVERY DAY AT BEDTIME    02/24/2016 Nucynta 75 mg tablet take 1 tablet by oral route 4 times every day as needed    01/27/2016 tizanidine 2 mg tablet TAKE 1-2 PO Q8HRS PRN SPASM     triamcinolone acetonide 1 application twice daily as needed for itching spots on arms/legs     Vitamin D3 1,000 unit tablet Take 1 tablet in the morning     Xalatan 0.005 % eye drops 1 drop into right eye at bedtime       ALLERGIES: Ingredient Reaction Medication Name Comment  ACETAMINOPHEN Nausea PERCOCET   OXYCODONE HCL Nausea PERCOCET       Vitals Date Temp F BP Pulse Ht In  Wt Lb BMI BSA Pain Score  02/24/2016  155/61 91 62 212 38.78  10/10      IMPRESSION Patient is having pain on her right side where her stimulator is located. Because she is having pain to palpation on her 12th rib on the right, I believe her pulse generator is pushing out on this 12th rib. On further review of her X-rays, surgical intervention is recommended to alleviate the patient of her current pain.   Completed Orders (this encounter) Order Details Reason Side Interpretation Result Initial Treatment Date Region  Hypertension education Follow up with primary care physician for relief of elevated blood pressure.        Dietary management education, guidance, and counseling encouraged to eat a well balanced diet and  follow up with primary care doctor.         Assessment/Plan # Detail Type Description   1. Assessment Lumbosacral neuritis (M54.17).       2. Assessment Lumbar post-laminectomy syndrome (M96.1).       3. Assessment Essential (primary) hypertension (I10).       4. Assessment Body mass index (BMI) 19 or less, adult (Z68.1).   Plan Orders Today's instructions / counseling include(s) Dietary management education, guidance, and counseling.         Pain Assessment/Treatment Pain Scale: 10/10. Method: Numeric Pain Intensity Scale. Location: back. Onset: 06/27/1976. Duration: varies. Quality: discomforting. Pain Assessment/Treatment follow-up plan of care: Patient is taking over the counter pain relievers for relief..  Fall Risk Plan The patient has not fallen in the last year.  Prescribe Nucynta. Schedule surgery for 5/25 to revise her implanted pulse generator.    Orders: Diagnostic Procedures: Assessment Procedure  M96.1 Reposition IPG - right  Instruction(s)/Education: Assessment Instruction  I10 Hypertension education  Z68.1 Dietary management education, guidance, and counseling    MEDICATIONS PRESCRIBED TODAY    Rx Quantity Refills  NUCYNTA 75 mg  120 0            Provider:  Marchia Meiers. Vertell Limber MD  02/24/2016 02:14 PM Dictation edited by: Marchia Meiers. Vertell Limber    CC Providers: Jill [facility] [address] [place],  [place]  [id number]-   Azion Centrella MD [address] [place], [place] [address]              Electronically signed by Marchia Meiers. Vertell Limber MD on 02/26/2016 05:57 PM

## 2016-03-12 NOTE — Op Note (Signed)
03/12/2016  3:56 PM  PATIENT:  Robyn Hill  70 y.o. female  PRE-OPERATIVE DIAGNOSIS:  Lumbar post laminectomy syndrome, painful IPG for spinal cord stimulator  POST-OPERATIVE DIAGNOSIS:  Lumbar post laminectomy syndrome, painful IPG for spinal cord stimulator  PROCEDURE:  Procedure(s) with comments: Right Reposition of implantable pulse generator (Right) - Right Reposition of implantable pulse generator  SURGEON:  Surgeon(s) and Role:    * Erline Levine, MD - Primary  PHYSICIAN ASSISTANT:   ASSISTANTS: Poteat, RN   ANESTHESIA:   general  EBL:     BLOOD ADMINISTERED:none  DRAINS: none   LOCAL MEDICATIONS USED:  LIDOCAINE   SPECIMEN:  No Specimen  DISPOSITION OF SPECIMEN:  N/A  COUNTS:  YES  TOURNIQUET:  * No tourniquets in log *  DICTATION: DICTATION: Patient has implanted spinal cord stimulator electrodes and IPG, which is causing her discomfort.  It was elected for patient to undergo IPG revision with repositioning of battery.  PROCEDURE: Patient was brought to the operating room and given GETA.  Right lower back and hip region was prepped with betadine scrub and Duraprep.  Area of planned incision was infiltrated with lidocaine.  Prior incision was reopened and the old IPG was externalized.  New IPG site was created and IPG was tunneled and leads were passed to the pocket.  IPG was inserted and connected and couplings were tightened appropriately.  Impedences were correct.  Wound was irrigated with Vancomycin. Then irrigated once more.  Incision was closed with 2-0 Vicryl and 3-0 vicryl sutures and dressed with a sterile occlusive dressing.  Counts were correct at the end of the case.  PLAN OF CARE: Discharge to home after PACU  PATIENT DISPOSITION:  PACU - hemodynamically stable.   Delay start of Pharmacological VTE agent (>24hrs) due to surgical blood loss or risk of bleeding: yes

## 2016-03-12 NOTE — Discharge Summary (Signed)
Physician Discharge Summary  Patient ID: Robyn Hill MRN: [id number] DOB/AGE: [birth year] 70 y.o.  Admit date: 03/12/2016 Discharge date: 03/13/2016  Admission Diagnoses:Lumbar post laminectomy syndrome, painful IPG for spinal cord stimulator   Discharge Diagnoses: Lumbar post laminectomy syndrome, painful IPG for spinal cord stimulator s/p Right Reposition of implantable pulse generator (Right) - Right Reposition of implantable pulse generator  Active Problems:   * No active hospital problems. *   Discharged Condition: good  Hospital Course: Robyn Hill was admitted for repositioning of spinal cord stimulator implanted pulse generator. Following uncomplicated procedure, she recovered nicely. Incisional pain slowed mobilization and prompted overnight stay on 3C for nursing care. Pain is well-controlled by Norco this morning 03/13/16.  Consults: None  Significant Diagnostic Studies:   Treatments: surgery: Right Reposition of implantable pulse generator (Right) - Right Reposition of implantable pulse generator   Discharge Exam: Blood pressure 158/76, pulse 75, temperature 98.2 F (36.8 C), temperature source Oral, resp. rate 20, weight 94.802 kg (209 lb), SpO2 95 %.  Alert, conversant, smiling. Pain well-controlled. Spinal cord stimulator working properly, covering LLE. Incisions without erythema, swelling, or drainage. Honeycomb drsg over Dermabond intact.   Disposition: 01-Home or Self Care  Pt will f/u in office for incision check in 3-4 weeks. Rx Norco 10/325 1/1-1 tab po q6hrs prn pain #80.      Medication List    ASK your doctor about these medications        aspirin EC 81 MG tablet  Take 1 tablet (81 mg total) by mouth daily.     atorvastatin 40 MG tablet  Commonly known as:  LIPITOR  Take 1 tablet (40 mg total) by mouth daily. Taking at bedtime     celecoxib 200 MG capsule  Commonly known as:  CELEBREX  Take 1 capsule (200 mg total) by mouth 2 (two) times  daily.     celecoxib 200 MG capsule  Commonly known as:  CELEBREX  TAKE 1 CAPSULE(200 MG) BY MOUTH TWICE DAILY     cholecalciferol 1000 units tablet  Commonly known as:  VITAMIN D  Take 1,000 Units by mouth every morning.     dexlansoprazole 60 MG capsule  Commonly known as:  DEXILANT  Take 1 capsule (60 mg total) by mouth 2 (two) times daily.     dorzolamide-timolol 22.3-6.8 MG/ML ophthalmic solution  Commonly known as:  COSOPT  Place 1 drop into both eyes 2 (two) times daily.     DULoxetine 60 MG capsule  Commonly known as:  CYMBALTA  Take 1 capsule (60 mg total) by mouth 2 (two) times daily.     enalapril 20 MG tablet  Commonly known as:  VASOTEC  Take 1 tablet (20 mg total) by mouth daily.     Exenatide ER 2 MG Pen  Commonly known as:  BYDUREON  Inject 2 mg into the skin once a week.     ferrous gluconate 324 MG tablet  Commonly known as:  FERGON  Take 324 mg by mouth daily with breakfast.     hydrochlorothiazide 25 MG tablet  Commonly known as:  HYDRODIURIL  Take 1 tablet (25 mg total) by mouth daily.     HYDROcodone-acetaminophen 10-325 MG tablet  Commonly known as:  NORCO  Take 1-2 tablets by mouth every 4 (four) hours as needed for moderate pain or severe pain.     Insulin Glargine 300 UNIT/ML Sopn  Commonly known as:  TOUJEO SOLOSTAR  Inject 10 Units into the skin at  bedtime.     latanoprost 0.005 % ophthalmic solution  Commonly known as:  XALATAN  Place 1 drop into the right eye at bedtime.     metFORMIN 1000 MG tablet  Commonly known as:  GLUCOPHAGE  Take 1 tablet (1,000 mg total) by mouth 2 (two) times daily with a meal.     methocarbamol 500 MG tablet  Commonly known as:  ROBAXIN  Take 1 tablet (500 mg total) by mouth every 6 (six) hours as needed for muscle spasms.     mupirocin ointment 2 %  Commonly known as:  BACTROBAN  Apply 1 application topically 2 (two) times daily as needed. Reported on 02/19/2016     nortriptyline 10 MG capsule   Commonly known as:  PAMELOR  Take 1 capsule (10 mg total) by mouth at bedtime.     Pen Needles 31G X 6 MM Misc  Use these for Toujeo pen.     tizanidine 2 MG capsule  Commonly known as:  ZANAFLEX  Take 2 mg by mouth 3 (three) times daily.     triamcinolone cream 0.1 %  Commonly known as:  KENALOG  Apply 1 application topically as needed (for skin irritation). Reported on 02/19/2016           Follow-up Information    Follow up with Peggyann Shoals, MD.   Specialty:  Neurosurgery   Contact information:   [address] [place] [address] [place] [address] [phone]       Signed: Verdis Prime 03/12/2016, 4:18 PM

## 2016-03-12 NOTE — Progress Notes (Signed)
Patient ID: Robyn Hill, female   DOB: [birth year], 70 y.o.   MRN: [id number] Operative site pain persists. Pt notes some improvement with position changes and standing. She is able to get stim coverage in her left leg as pre-op, but SCS does not cover as high as her IPG on the right. (Reminded pt that rib stim when it occurs is from increased settings and is not a therapeutic component & would not likely help incisional pain.) Ok per DrStern for pt to stay overnight; will admit to [facility], and order for Norco 5/325 1-2 po q4hrs prn pain given. Pt agrees to plan. Husband agrees to plan.   Verdis Prime RN BSN

## 2016-03-12 NOTE — Transfer of Care (Signed)
Immediate Anesthesia Transfer of Care Note  Patient: Robyn Hill  Procedure(s) Performed: Procedure(s) with comments: Right Reposition of implantable pulse generator (Right) - Right Reposition of implantable pulse generator  Patient Location: PACU  Anesthesia Type:General  Level of Consciousness: awake, oriented, sedated, patient cooperative and responds to stimulation  Airway & Oxygen Therapy: Patient Spontanous Breathing and Patient connected to nasal cannula oxygen  Post-op Assessment: Report given to RN, Post -op Vital signs reviewed and stable, Patient moving all extremities and Patient moving all extremities X 4  Post vital signs: Reviewed and stable  Last Vitals:  Filed Vitals:   03/12/16 1207  BP: 158/76  Pulse: 75  Temp: 36.8 C  Resp: 20    Last Pain:  Filed Vitals:   03/12/16 1208  PainSc: 8       Patients Stated Pain Goal: 1 ([id number])  Complications: No apparent anesthesia complications

## 2016-03-12 NOTE — Anesthesia Preprocedure Evaluation (Signed)
Anesthesia Evaluation  Patient identified by MRN, date of birth, ID band Patient awake    Reviewed: Allergy & Precautions, H&P , NPO status , Patient's Chart, lab work & pertinent test results  Airway Mallampati: II  TM Distance: >3 FB Neck ROM: full    Dental no notable dental hx. (+) Teeth Intact, Dental Advisory Given,    Pulmonary asthma ,  TMJ problems   Pulmonary exam normal breath sounds clear to auscultation       Cardiovascular Exercise Tolerance: Good hypertension, Pt. on medications Normal cardiovascular exam Rhythm:Regular Rate:Normal     Neuro/Psych  Headaches, Glaucoma. Chronic back pain  Neuromuscular disease negative psych ROS   GI/Hepatic negative GI ROS, Neg liver ROS, hiatal hernia, GERD  Medicated and Controlled,  Endo/Other  diabetes, Poorly Controlled, Type 2, Oral Hypoglycemic Agents, Insulin DependentHypothyroidism Adrenal mass  Renal/GU negative Renal ROS  negative genitourinary   Musculoskeletal   Abdominal   Peds  Hematology negative hematology ROS (+)   Anesthesia Other Findings Blind OD w/ limited vision OS  Patient has chronic pain, takes vicodin 1pill every 2 hours, unable to swallow pills without thick liquids like apple sauce she reports bc of "small trachea"  Previous Grade 1 view in past  Reproductive/Obstetrics negative OB ROS                             Anesthesia Physical  Anesthesia Plan  ASA: III  Anesthesia Plan: General   Post-op Pain Management:    Induction: Intravenous  Airway Management Planned: Oral ETT  Additional Equipment: None  Intra-op Plan:   Post-operative Plan: Extubation in OR  Informed Consent: I have reviewed the patients History and Physical, chart, labs and discussed the procedure including the risks, benefits and alternatives for the proposed anesthesia with the patient or authorized representative who has  indicated his/her understanding and acceptance.   Dental Advisory Given  Plan Discussed with: CRNA and Anesthesiologist  Anesthesia Plan Comments:         Anesthesia Quick Evaluation

## 2016-03-12 NOTE — Brief Op Note (Signed)
03/12/2016  3:56 PM  PATIENT:  Robyn Hill  70 y.o. female  PRE-OPERATIVE DIAGNOSIS:  Lumbar post laminectomy syndrome, painful IPG for spinal cord stimulator  POST-OPERATIVE DIAGNOSIS:  Lumbar post laminectomy syndrome, painful IPG for spinal cord stimulator  PROCEDURE:  Procedure(s) with comments: Right Reposition of implantable pulse generator (Right) - Right Reposition of implantable pulse generator  SURGEON:  Surgeon(s) and Role:    * Erline Levine, MD - Primary  PHYSICIAN ASSISTANT:   ASSISTANTS: Poteat, RN   ANESTHESIA:   general  EBL:     BLOOD ADMINISTERED:none  DRAINS: none   LOCAL MEDICATIONS USED:  LIDOCAINE   SPECIMEN:  No Specimen  DISPOSITION OF SPECIMEN:  N/A  COUNTS:  YES  TOURNIQUET:  * No tourniquets in log *  DICTATION: DICTATION: Patient has implanted spinal cord stimulator electrodes and IPG, which is causing her discomfort.  It was elected for patient to undergo IPG revision with repositioning of battery.  PROCEDURE: Patient was brought to the operating room and given GETA.  Right lower back and hip region was prepped with betadine scrub and Duraprep.  Area of planned incision was infiltrated with lidocaine.  Prior incision was reopened and the old IPG was externalized.  New IPG site was created and IPG was tunneled and leads were passed to the pocket.  IPG was inserted and connected and couplings were tightened appropriately.  Impedences were correct.  Wound was irrigated with Vancomycin. Then irrigated once more.  Incision was closed with 2-0 Vicryl and 3-0 vicryl sutures and dressed with a sterile occlusive dressing.  Counts were correct at the end of the case.  PLAN OF CARE: Discharge to home after PACU  PATIENT DISPOSITION:  PACU - hemodynamically stable.   Delay start of Pharmacological VTE agent (>24hrs) due to surgical blood loss or risk of bleeding: yes

## 2016-03-13 DIAGNOSIS — E119 Type 2 diabetes mellitus without complications: Secondary | ICD-10-CM | POA: Diagnosis not present

## 2016-03-13 DIAGNOSIS — I10 Essential (primary) hypertension: Secondary | ICD-10-CM | POA: Diagnosis not present

## 2016-03-13 DIAGNOSIS — K219 Gastro-esophageal reflux disease without esophagitis: Secondary | ICD-10-CM | POA: Diagnosis not present

## 2016-03-13 DIAGNOSIS — M961 Postlaminectomy syndrome, not elsewhere classified: Secondary | ICD-10-CM | POA: Diagnosis not present

## 2016-03-13 DIAGNOSIS — G8929 Other chronic pain: Secondary | ICD-10-CM | POA: Diagnosis not present

## 2016-03-13 DIAGNOSIS — J45909 Unspecified asthma, uncomplicated: Secondary | ICD-10-CM | POA: Diagnosis not present

## 2016-03-13 LAB — GLUCOSE, CAPILLARY: Glucose-Capillary: 137 mg/dL — ABNORMAL HIGH (ref 65–99)

## 2016-03-13 NOTE — Progress Notes (Signed)
OT Cancellation Note  Patient Details Name: MAKHALA PARKMAN MRN: [id number] DOB: 13-May-1946   Cancelled Treatment:    Reason Eval/Treat Not Completed: OT screened, no needs identified, will sign off. Pt verbalized understanding of all back precautions, modifications for ADLs, and has all necessary DME.  Redmond Baseman, OTR/L Pager: [phone] 03/13/2016, 8:38 AM

## 2016-03-13 NOTE — Progress Notes (Signed)
Patient alert and oriented, mae's well, voiding adequate amount of urine, swallowing without difficulty, no c/o pain. Patient discharged home with family. Script and discharged instructions given to patient. Patient and family stated understanding of d/c instructions given and has an appointment with MD. 

## 2016-03-13 NOTE — Progress Notes (Signed)
PT Cancellation Note  Patient Details Name: Robyn Hill MRN: [id number] DOB: 02-25-1946   Cancelled Treatment:    Reason Eval/Treat Not Completed: PT screened, no needs identified, will sign off.   Discussed pt case with OT who states that pt has understanding of all back precautions and has appropriate DME available at home. PT eval not warranted at this time. If needs change please reconsult.    Rolinda Roan 03/13/2016, 9:39 AM   Rolinda Roan, PT, DPT Acute Rehabilitation Services Pager: [phone]

## 2016-03-16 ENCOUNTER — Encounter (HOSPITAL_COMMUNITY): Payer: Self-pay | Admitting: Neurosurgery

## 2016-03-17 ENCOUNTER — Other Ambulatory Visit: Payer: Self-pay | Admitting: Medical

## 2016-03-17 NOTE — Anesthesia Postprocedure Evaluation (Signed)
Anesthesia Post Note  Patient: Robyn Hill  Procedure(s) Performed: Procedure(s) (LRB): Right Reposition of implantable pulse generator (Right)  Patient location during evaluation: PACU Anesthesia Type: General Level of consciousness: awake and alert Pain management: pain level controlled Vital Signs Assessment: post-procedure vital signs reviewed and stable Respiratory status: spontaneous breathing, nonlabored ventilation, respiratory function stable and patient connected to nasal cannula oxygen Cardiovascular status: blood pressure returned to baseline and stable Postop Assessment: no signs of nausea or vomiting Anesthetic complications: no     Last Vitals:  Filed Vitals:   03/13/16 0438 03/13/16 0743  BP: 126/48 121/59  Pulse: 72 77  Temp: 36.6 C 36.7 C  Resp: 18 18    Last Pain:  Filed Vitals:   03/13/16 0752  PainSc: 3    Pain Goal: Patients Stated Pain Goal: 3 (03/13/16 0145)               Montez Hageman

## 2016-03-23 ENCOUNTER — Telehealth: Payer: Self-pay

## 2016-03-23 DIAGNOSIS — L282 Other prurigo: Secondary | ICD-10-CM | POA: Diagnosis not present

## 2016-03-23 DIAGNOSIS — L570 Actinic keratosis: Secondary | ICD-10-CM | POA: Diagnosis not present

## 2016-03-23 NOTE — Telephone Encounter (Signed)
Faxed back.

## 2016-03-23 NOTE — Telephone Encounter (Signed)
Fax rcvd from [facility] ([place], [place])  requesting clarification on pt's accu-check strips. Script needs testing frequency listed on the sig d/t Medicare standards. Victorino December

## 2016-03-27 ENCOUNTER — Encounter (HOSPITAL_COMMUNITY): Payer: Self-pay | Admitting: Neurosurgery

## 2016-04-24 ENCOUNTER — Other Ambulatory Visit: Payer: Self-pay | Admitting: Medical

## 2016-04-24 ENCOUNTER — Ambulatory Visit (INDEPENDENT_AMBULATORY_CARE_PROVIDER_SITE_OTHER): Payer: Medicare Other | Admitting: Medical

## 2016-04-24 ENCOUNTER — Encounter: Payer: Self-pay | Admitting: Medical

## 2016-04-24 VITALS — BP 128/80 | HR 80 | Wt 210.0 lb

## 2016-04-24 DIAGNOSIS — Z9889 Other specified postprocedural states: Secondary | ICD-10-CM | POA: Diagnosis not present

## 2016-04-24 DIAGNOSIS — M549 Dorsalgia, unspecified: Secondary | ICD-10-CM

## 2016-04-24 DIAGNOSIS — Z9689 Presence of other specified functional implants: Secondary | ICD-10-CM

## 2016-04-24 DIAGNOSIS — G8929 Other chronic pain: Secondary | ICD-10-CM | POA: Diagnosis not present

## 2016-04-24 DIAGNOSIS — R109 Unspecified abdominal pain: Secondary | ICD-10-CM | POA: Diagnosis not present

## 2016-04-24 DIAGNOSIS — M4316 Spondylolisthesis, lumbar region: Secondary | ICD-10-CM | POA: Diagnosis not present

## 2016-04-24 DIAGNOSIS — M961 Postlaminectomy syndrome, not elsewhere classified: Secondary | ICD-10-CM

## 2016-04-24 MED ORDER — NORTRIPTYLINE HCL 25 MG PO CAPS
25.0000 mg | ORAL_CAPSULE | Freq: Every day | ORAL | Status: DC
Start: 2016-04-24 — End: 2016-08-03

## 2016-04-24 NOTE — Progress Notes (Signed)
Subjective: Chief Complaint  Patient presents with  . rib pain    incision from where the back device was originally placed. states it hurts her bad and it was sitting on 13th rib.    Here for c/o pain in right rib area.   She had spinal cord stimulate inserted done 10/2015.  Last visit here she noted ongoing pain in the area of the surgery.   Back in May she asked Dr. Vertell Limber if it could be on a rib, but he said no.  Dr. Vertell Limber and Dr. Maryjean Ka both evaluated her, ended up moving the pain stimulator down more.   She told them since then that she continues to have pain in that area, and notes Dr. Vertell Limber said he didn't no why she has pain there.   She still notes terrible pain and burning in right rib area.   She also has hx/o cyst excised from right lower back as well.  Tried icy hot and blue emu creams, seems to help some.  No new paresthesias, no new incontinence, no other c/o, no fever.  No other aggravating or relieving factors. No other complaint.  Past Medical History  Diagnosis Date  . Hypertension   . Hyperlipidemia   . Glaucoma   . Urinary incontinence   . Chronic back pain   . Osteoarthritis   . Edema   . Female bladder prolapse   . Allergy   . GERD (gastroesophageal reflux disease)   . Diverticulosis   . Chronic diarrhea     Colestipol therapy  . Blood transfusion 1970  . Vitamin D deficiency   . Rheumatic fever     age 70  . Diverticulitis   . Fatty liver   . H/O hiatal hernia   . Blind right eye     since childhood  . Asthma     "as a teenager"  . Type II diabetes mellitus ([place])   . History of UTI     remote past  . H/O bone density study 11/29/2014    normal study although mild decreased in density from prior study  . History of uterine cancer     s/p hysterectomy  . Neuromuscular disorder ([place])     diabetic neuropahthy  . Migraine     hx of migraines in past   . Anemia   . Adrenal mass, right ([place])   . Numbness and tingling of both legs   . Hemorrhoids   .  Wears glasses   . Vision loss of left eye   . Depression   . History of kidney stones    Past Surgical History  Procedure Laterality Date  . Tonsillectomy and adenoidectomy  1952  . Minor laceration repair      right eye  . Cardiac catheterization  11/2006  . Liver biopsy  04/1999  . Edsi for back pain  last 01/2009    multiple x 5  . Steroid injections of hip  last 02/2009    multiple x 4  . Eye surgery      Bil Blind R eye  . Posterior fusion lumbar spine  04/05/12  . Breast biopsy  08/1997    right  . Kidney stone surgery  1979  . Cholecystectomy    . Trabeculectomy  1996    bilaterally; "for glaucoma"  . Dilation and curettage of uterus  1980  . Back surgery    . Colonoscopy  2014    polyps, external hemorrhoids  . Abdominal  hysterectomy  1980    no further pap smears needed  . Upper gi endoscopy  05/21/14    food, retained in the body of the stomach  . Cystoscopy N/A 01/29/2015    Procedure: CYSTOSCOPY;  Surgeon: Bjorn Loser, MD;  [facility]: [facility];  Service: Urology;  Laterality: N/A;  . Anterior and posterior repair N/A 01/29/2015    Procedure: CYSTOCELE AND RECTOCELE ;  Surgeon: Bjorn Loser, MD;  Location: [facility];  Service: Urology;  Laterality: N/A;  . Vaginal prolapse repair N/A 01/29/2015    Procedure:  VAULT PROLAPSE WITH GRAFT ;  Surgeon: Bjorn Loser, MD;  Location: [facility];  Service: Urology;  Laterality: N/A;  . Spinal cord stimulator insertion N/A 09/06/2015    Procedure: LUMBAR SPINAL CORD STIMULATOR INSERTION;  Surgeon: Clydell Hakim, MD;  Location: [place] [facility];  Service: Neurosurgery;  Laterality: N/A;  Spinal Cord Stimulator placement  . Spinal cord stimulator insertion N/A 10/22/2015    Procedure: Laminectomy for spinal cord stimulator paddle lead and implantable generator placement;  Surgeon: Erline Levine, MD;  Location: [place] [facility];  Service: Neurosurgery;  Laterality: N/A;  Laminectomy for spinal cord stimulator paddle lead and implantable generator  placement  . Pulse generator implant Right 03/12/2016    Procedure: Right Reposition of implantable pulse generator;  Surgeon: Erline Levine, MD;  Location: [place] [facility];  Service: Neurosurgery;  Laterality: Right;  Right Reposition of implantable pulse generator     Objective: BP 128/80 mmHg  Pulse 80  Wt 210 lb (95.255 kg)  Gen: wd, wn, nad Right flank and low back with 3 linear surgical scars, one in right lateral low thoracic area, then another linear surgical scar over upper lumbar region on right, then another similar linear surgical scar over upper buttock region on the right  Over the surgical scar right lateral low thoracic area is a small 1 cm lump approx 1-2 cm diameter, seems well defined suggestive of sebaceous cyst that is tender    Assessment: Encounter Diagnoses  Name Primary?  . Flank pain Yes  . Spondylolisthesis of lumbar region   . Chronic back pain   . Post laminectomy syndrome   . Status post insertion of spinal cord stimulator     Plan: discussed concerns, symptoms.    Etiology possible sensory nerve inflamed along right lower ribs, but can't rule out DDD, mass or other which seem less likely.   Gave her options including medication, f/u with neurosurgery, imaging.  Begin higher dose of Nortriptyline, can c/t the topical OTC creams she is using. Within 2 weeks if not improving, call or return or f/u with Dr. Maryjean Ka at Neurosurgery.    Robyn Hill was seen today for rib pain.  Diagnoses and all orders for this visit:  Flank pain  Spondylolisthesis of lumbar region  Chronic back pain  Post laminectomy syndrome  Status post insertion of spinal cord stimulator  Other orders -     nortriptyline (PAMELOR) 25 MG capsule; Take 1 capsule (25 mg total) by mouth at bedtime.

## 2016-05-11 DIAGNOSIS — M961 Postlaminectomy syndrome, not elsewhere classified: Secondary | ICD-10-CM | POA: Diagnosis not present

## 2016-05-11 DIAGNOSIS — M4310 Spondylolisthesis, site unspecified: Secondary | ICD-10-CM | POA: Diagnosis not present

## 2016-05-11 DIAGNOSIS — M4806 Spinal stenosis, lumbar region: Secondary | ICD-10-CM | POA: Diagnosis not present

## 2016-05-11 DIAGNOSIS — M5417 Radiculopathy, lumbosacral region: Secondary | ICD-10-CM | POA: Diagnosis not present

## 2016-05-11 DIAGNOSIS — I10 Essential (primary) hypertension: Secondary | ICD-10-CM | POA: Diagnosis not present

## 2016-05-11 DIAGNOSIS — Z6837 Body mass index (BMI) 37.0-37.9, adult: Secondary | ICD-10-CM | POA: Diagnosis not present

## 2016-05-13 ENCOUNTER — Other Ambulatory Visit: Payer: Self-pay | Admitting: Medical

## 2016-05-14 DIAGNOSIS — H401133 Primary open-angle glaucoma, bilateral, severe stage: Secondary | ICD-10-CM | POA: Diagnosis not present

## 2016-05-22 ENCOUNTER — Telehealth: Payer: Self-pay

## 2016-05-22 NOTE — Telephone Encounter (Signed)
I received a voicemail to call the pt back, called both numbers on file and got no answer or a VM in order to be able to leave a VM

## 2016-05-22 NOTE — Telephone Encounter (Signed)
Pt called and wanted to give an update that on Aug 10th Robyn Hill has his appt with Kidney specialist, Dr.Webb. [name] sees Cardiologist on 06/01/16 on the same day as Robyn Hill.

## 2016-05-29 ENCOUNTER — Ambulatory Visit: Payer: Medicare Other | Admitting: Internal Medicine

## 2016-05-31 NOTE — Progress Notes (Signed)
Cardiology Office Note   Date:  06/01/2016   ID:  Robyn Hill, DOB 06/30/1946, MRN [id number]  PCP:  Crisoforo Oxford, PA-C  Cardiologist:   Minus Breeding, MD   Chief Complaint  Patient presents with  . Fatigue      History of Present Illness: Robyn Hill is a 70 y.o. female who presents for evaluation of fatigue and diaphoresis.  The patient has no past cardiac history although she has one of the most dramatic family histories that I have seen.  She did have a cath she said years [place] another [place] and had no CAD per her report.  She also reports that she was told that she had MVP in the past but has not had any recent imaging. She has severe back pain and is very limited by this.  She can walk in the grocery store holding on to a cart.  She presents with extreme fatigue.  This is with any activity.  There may be some vague chest discomfort and dyspnea with activity.  However, she does not describe neck or arm or jaw pain.  She does not have PND or orthopnea.  She doesn't sleep well because of pain.  She describes excessive diaphoresis with any activity.  She has had a work up and I have reviewed labs that include normal (recentl) CBC, normal BMET and TSH.     Past Medical History:  Diagnosis Date  . Adrenal mass, right ([place])   . Allergy   . Anemia   . Asthma    "as a teenager"  . Blind right eye    since childhood  . Blood transfusion 1970  . Chronic back pain   . Chronic diarrhea    Colestipol therapy  . Depression   . Diverticulitis   . Diverticulosis   . Fatty liver   . Female bladder prolapse   . GERD (gastroesophageal reflux disease)   . Glaucoma   . H/O bone density study 11/29/2014   normal study although mild decreased in density from prior study  . H/O hiatal hernia   . Hemorrhoids   . History of kidney stones   . History of uterine cancer    s/p hysterectomy  . Hyperlipidemia   . Hypertension   . Migraine    hx of migraines in past   .  Neuromuscular disorder ([place])    diabetic neuropahthy  . Numbness and tingling of both legs   . Osteoarthritis   . Rheumatic fever    age 36  . Type II diabetes mellitus ([place])   . Urinary incontinence   . Vitamin D deficiency     Past Surgical History:  Procedure Laterality Date  . ABDOMINAL HYSTERECTOMY  1980   no further pap smears needed  . ANTERIOR AND POSTERIOR REPAIR N/A 01/29/2015   Procedure: CYSTOCELE AND RECTOCELE ;  Surgeon: Bjorn Loser, MD;  [facility];  Service: Urology;  Laterality: N/A;  . BACK SURGERY    . BREAST BIOPSY  08/1997   right  . CARDIAC CATHETERIZATION  11/2006  . CHOLECYSTECTOMY    . COLONOSCOPY  2014   polyps, external hemorrhoids  . CYSTOSCOPY N/A 01/29/2015   Procedure: CYSTOSCOPY;  Surgeon: Bjorn Loser, MD;  [facility];  Service: Urology;  Laterality: N/A;  . DILATION AND CURETTAGE OF UTERUS  1980  . EDSI for back pain  last 01/2009   multiple x 5  . EYE SURGERY  Bil Blind R eye  . [place]  . LIVER BIOPSY  04/1999  . minor laceration repair     right eye  . POSTERIOR FUSION LUMBAR SPINE  04/05/12  . PULSE GENERATOR IMPLANT Right 03/12/2016   Procedure: Right Reposition of implantable pulse generator;  Surgeon: Erline Levine, MD;  Location: [place] [facility];  Service: Neurosurgery;  Laterality: Right;  Right Reposition of implantable pulse generator  . SPINAL CORD STIMULATOR INSERTION N/A 09/06/2015   Procedure: LUMBAR SPINAL CORD STIMULATOR INSERTION;  Surgeon: Clydell Hakim, MD;  Location: [place] [facility];  Service: Neurosurgery;  Laterality: N/A;  Spinal Cord Stimulator placement  . SPINAL CORD STIMULATOR INSERTION N/A 10/22/2015   Procedure: Laminectomy for spinal cord stimulator paddle lead and implantable generator placement;  Surgeon: Erline Levine, MD;  Location: [place] [facility];  Service: Neurosurgery;  Laterality: N/A;  Laminectomy for spinal cord stimulator paddle lead and implantable generator placement  .  steroid injections of hip  last 02/2009   multiple x 4  . TONSILLECTOMY AND ADENOIDECTOMY  1952  . TRABECULECTOMY  1996   bilaterally; "for glaucoma"  . UPPER GI ENDOSCOPY  05/21/14   food, retained in the body of the stomach  . VAGINAL PROLAPSE REPAIR N/A 01/29/2015   Procedure:  VAULT PROLAPSE WITH GRAFT ;  Surgeon: Bjorn Loser, MD;  [facility];  Service: Urology;  Laterality: N/A;     Current Outpatient Prescriptions  Medication Sig Dispense Refill  . ACCU-CHEK AVIVA PLUS test strip TEST AS DIRECTED DAILY 300 each 1  . ACCU-CHEK SOFTCLIX LANCETS lancets TEST DAILY 100 each 5  . aspirin EC 81 MG tablet Take 1 tablet (81 mg total) by mouth daily. 90 tablet 3  . atorvastatin (LIPITOR) 40 MG tablet Take 1 tablet (40 mg total) by mouth daily. Taking at bedtime (Patient taking differently: Take 40 mg by mouth at bedtime. ) 90 tablet 3  . B-D UF III MINI PEN NEEDLES 31G X 5 MM MISC USE TO INJECT TOUJEO 100 each 2  . celecoxib (CELEBREX) 200 MG capsule Take 1 capsule (200 mg total) by mouth 2 (two) times daily. 180 capsule 3  . cholecalciferol (VITAMIN D) 1000 UNITS tablet Take 1,000 Units by mouth every morning.     Marland Kitchen dexlansoprazole (DEXILANT) 60 MG capsule Take 1 capsule (60 mg total) by mouth 2 (two) times daily. 180 capsule 1  . dorzolamide-timolol (COSOPT) 22.3-6.8 MG/ML ophthalmic solution Place 1 drop into both eyes 2 (two) times daily.    . DULoxetine (CYMBALTA) 60 MG capsule Take 1 capsule (60 mg total) by mouth 2 (two) times daily. 180 capsule 3  . enalapril (VASOTEC) 20 MG tablet Take 1 tablet (20 mg total) by mouth daily. 90 tablet 3  . Exenatide ER (BYDUREON) 2 MG PEN Inject 2 mg into the skin once a week. 4 each 11  . ferrous gluconate (FERGON) 324 MG tablet Take 324 mg by mouth daily with breakfast.    . hydrochlorothiazide (HYDRODIURIL) 25 MG tablet Take 1 tablet (25 mg total) by mouth daily. 90 tablet 3  . HYDROcodone-acetaminophen (NORCO) 10-325 MG per tablet Take 1-2  tablets by mouth every 4 (four) hours as needed for moderate pain or severe pain.     . Insulin Glargine (TOUJEO SOLOSTAR) 300 UNIT/ML SOPN Inject 10 Units into the skin at bedtime. 3 mL 11  . Insulin Pen Needle (PEN NEEDLES) 31G X 6 MM MISC Use these for Toujeo pen. 200 each 11  .  latanoprost (XALATAN) 0.005 % ophthalmic solution Place 1 drop into the right eye at bedtime.    . metFORMIN (GLUCOPHAGE) 1000 MG tablet Take 1 tablet (1,000 mg total) by mouth 2 (two) times daily with a meal. 180 tablet 3  . mupirocin ointment (BACTROBAN) 2 % Apply 1 application topically 2 (two) times daily as needed. Reported on 04/24/2016    . nortriptyline (PAMELOR) 25 MG capsule Take 1 capsule (25 mg total) by mouth at bedtime. 30 capsule 2  . triamcinolone cream (KENALOG) 0.1 % Apply 1 application topically as needed (for skin irritation). Reported on 04/24/2016    . tiZANidine (ZANAFLEX) 2 MG tablet Take 1 tablet by mouth every 8 (eight) hours as needed.  0   No current facility-administered medications for this visit.     Allergies:   Sunflowerseed oil; Kiwi extract; Tape; Invokana [canagliflozin]; and Oxycodone    Social History:  The patient  reports that she has never smoked. She has never used smokeless tobacco. She reports that she drinks alcohol. She reports that she does not use drugs.   Family History:  The patient's family history includes Cancer (age of onset: 75) in her mother; Cancer - Colon in her brother; Diabetes in her other; Heart attack in her brother, brother, brother, and sister; Hypertension in her other.    ROS:  Please see the history of present illness.   Otherwise, review of systems are positive for none.   All other systems are reviewed and negative.    PHYSICAL EXAM: VS:  BP 90/60 (BP Location: Right Arm, Patient Position: Sitting, Cuff Size: Large)   Pulse 86   Ht 5\' 5"  (1.651 m)   Wt 206 lb 8 oz (93.7 kg)   BMI 34.36 kg/m  , BMI Body mass index is 34.36 kg/m. GENERAL:  Well  appearing HEENT:  Pupils equal round and reactive, fundi not visualized, oral mucosa unremarkable NECK:  No jugular venous distention, waveform within normal limits, carotid upstroke brisk and symmetric, no bruits, no thyromegaly LYMPHATICS:  No cervical, inguinal adenopathy LUNGS:  Clear to auscultation bilaterally BACK:  No CVA tenderness CHEST:  Unremarkable HEART:  PMI not displaced or sustained,S1 and S2 within normal limits, no S3, no S4, no clicks, no rubs, no murmurs ABD:  Flat, positive bowel sounds normal in frequency in pitch, no bruits, no rebound, no guarding, no midline pulsatile mass, no hepatomegaly, no splenomegaly EXT:  2 plus pulses throughout, no edema, no cyanosis no clubbing SKIN:  No rashes no nodules NEURO:  Cranial nerves II through XII grossly intact, motor grossly intact throughout PSYCH:  Cognitively intact, oriented to person place and time    EKG:  EKG is ordered today. The ekg ordered today demonstrates NSR, rate    Recent Labs: 02/19/2016: ALT 41; TSH 2.30 03/10/2016: BUN 24; Creatinine, Ser 0.73; Hemoglobin 13.2; Platelets 339; Potassium 4.4; Sodium 134    Lipid Panel    Component Value Date/Time   CHOL 137 08/06/2015 0001   TRIG 127 08/06/2015 0001   HDL 54 08/06/2015 0001   CHOLHDL 2.5 08/06/2015 0001   VLDL 25 08/06/2015 0001   LDLCALC 58 08/06/2015 0001      Wt Readings from Last 3 Encounters:  06/01/16 206 lb 8 oz (93.7 kg)  04/24/16 210 lb (95.3 kg)  03/12/16 209 lb (94.8 kg)      Other studies Reviewed: Additional studies/ records that were reviewed today include: Labs. Review of the above records demonstrates:  Please see elsewhere  in the note.     ASSESSMENT AND PLAN:  FATIGUE:  I suspect that this is multifactorial.  I would not exclude sleep apnea although she says she doesn't stop breathing per her husband.  She does not sleep through the night because of pain.  I would consider a sleep study in the future if she would  consent although the sensitivity of this would be decreased with her pain interfering and treatment would be less helpful in this situation.  Also she is on many meds which could be related to this as well.   SWEATS:  I have suggested follow up with her endocrinologist.    SOB:  Given this and the vague chest pain and the fatigue, with her remarkable family history she needs screening for CAD.   She would not be able to walk on a treadmill.  Therefore, she will have a [employer].  DM:  A1C is not at target.  This will be managed per Crisoforo Oxford, PA-C   Current medicines are reviewed at length with the patient today.  The patient does not have concerns regarding medicines.  The following changes have been made:  no change  Labs/ tests ordered today include:   Orders Placed This Encounter  Procedures  . Myocardial Perfusion Imaging  . EKG 12-Lead     Disposition:   FU with me in 3 months.     Signed, Minus Breeding, MD  06/01/2016 1:18 PM    [place]

## 2016-06-01 ENCOUNTER — Telehealth: Payer: Self-pay | Admitting: Medical

## 2016-06-01 ENCOUNTER — Ambulatory Visit (INDEPENDENT_AMBULATORY_CARE_PROVIDER_SITE_OTHER): Payer: Medicare Other | Admitting: Cardiology

## 2016-06-01 ENCOUNTER — Encounter: Payer: Self-pay | Admitting: Cardiology

## 2016-06-01 VITALS — BP 90/60 | HR 86 | Ht 65.0 in | Wt 206.5 lb

## 2016-06-01 DIAGNOSIS — R5382 Chronic fatigue, unspecified: Secondary | ICD-10-CM | POA: Diagnosis not present

## 2016-06-01 DIAGNOSIS — R0600 Dyspnea, unspecified: Secondary | ICD-10-CM

## 2016-06-01 DIAGNOSIS — R072 Precordial pain: Secondary | ICD-10-CM | POA: Diagnosis not present

## 2016-06-01 DIAGNOSIS — G4719 Other hypersomnia: Secondary | ICD-10-CM

## 2016-06-01 NOTE — Telephone Encounter (Signed)
LMTCB

## 2016-06-01 NOTE — Telephone Encounter (Signed)
Pt stated that she is taking 10 units every night. 119-139 every morning Asked about referral to Endo mentioned by Cardio.

## 2016-06-01 NOTE — Telephone Encounter (Signed)
I saw her cardiology note.  Please get her recent glucose readings and how much inulin she is using QHS currently?

## 2016-06-01 NOTE — Patient Instructions (Signed)
Medication Instructions:   NO CHANGE  Testing/Procedures:  Your physician has requested that you have a lexiscan myoview. For further information please visit [email or web address]. Please follow instruction sheet, as given.    Follow-Up:  Your physician recommends that you schedule a follow-up appointment in: [facility] [facility]

## 2016-06-02 NOTE — Telephone Encounter (Signed)
Is there any concern for sleep apnea, snoring, witness apnea, daytime sleepiness?  If so, sleep study may be helpful to eval for sleep apnea which can cause fatigue.    If not, its ok to refer to endocrinology.  Does the cardiologist suspect any certain condition other than diabetes?

## 2016-06-02 NOTE — Telephone Encounter (Signed)
Referral done for sleep study and she said that Cardio mentioned having a sleep study done as well. Said all of this should be addressed in his OV note. Pt will wait on seeing Endo right now

## 2016-06-02 NOTE — Telephone Encounter (Signed)
Pt said that the Cardio thought she needed it for not the diabetes but to look into her endocrine glands because she sweats so bad and is always tires. Is aware of upping her insulin.

## 2016-06-02 NOTE — Telephone Encounter (Signed)
Bump up to 12 units QHS.   Her sugars are not that bad, so I don't think she needs referral at this time.

## 2016-06-09 ENCOUNTER — Encounter (HOSPITAL_COMMUNITY): Payer: Medicare Other

## 2016-06-12 ENCOUNTER — Ambulatory Visit (HOSPITAL_COMMUNITY)
Admission: RE | Admit: 2016-06-12 | Discharge: 2016-06-12 | Disposition: A | Discharge status: Home / Self Care | Payer: Medicare Other | Source: Ambulatory Visit | Attending: Cardiology | Admitting: Cardiology

## 2016-06-12 DIAGNOSIS — Z8249 Family history of ischemic heart disease and other diseases of the circulatory system: Secondary | ICD-10-CM | POA: Diagnosis not present

## 2016-06-12 DIAGNOSIS — E119 Type 2 diabetes mellitus without complications: Secondary | ICD-10-CM | POA: Diagnosis not present

## 2016-06-12 DIAGNOSIS — R002 Palpitations: Secondary | ICD-10-CM | POA: Diagnosis not present

## 2016-06-12 DIAGNOSIS — R61 Generalized hyperhidrosis: Secondary | ICD-10-CM | POA: Insufficient documentation

## 2016-06-12 DIAGNOSIS — R072 Precordial pain: Secondary | ICD-10-CM

## 2016-06-12 DIAGNOSIS — R0600 Dyspnea, unspecified: Secondary | ICD-10-CM | POA: Diagnosis not present

## 2016-06-12 DIAGNOSIS — Z6834 Body mass index (BMI) 34.0-34.9, adult: Secondary | ICD-10-CM | POA: Diagnosis not present

## 2016-06-12 DIAGNOSIS — R5383 Other fatigue: Secondary | ICD-10-CM | POA: Insufficient documentation

## 2016-06-12 DIAGNOSIS — E669 Obesity, unspecified: Secondary | ICD-10-CM | POA: Diagnosis not present

## 2016-06-12 DIAGNOSIS — R0609 Other forms of dyspnea: Secondary | ICD-10-CM | POA: Diagnosis not present

## 2016-06-12 MED ORDER — TECHNETIUM TC 99M TETROFOSMIN IV KIT
31.0000 | PACK | Freq: Once | INTRAVENOUS | Status: AC | PRN
  Administered 2016-06-12: 31 via INTRAVENOUS
  Filled 2016-06-12: qty 31

## 2016-06-12 MED ORDER — REGADENOSON 0.4 MG/5ML IV SOLN
0.4000 mg | Freq: Once | INTRAVENOUS | Status: AC
  Administered 2016-06-12: 0.4 mg via INTRAVENOUS

## 2016-06-12 MED ORDER — TECHNETIUM TC 99M TETROFOSMIN IV KIT
10.2000 | PACK | Freq: Once | INTRAVENOUS | Status: AC | PRN
  Administered 2016-06-12: 10.2 via INTRAVENOUS
  Filled 2016-06-12: qty 10

## 2016-06-14 LAB — MYOCARDIAL PERFUSION IMAGING
LV dias vol: 86 mL (ref 46–106)
LV sys vol: 28 mL
Peak HR: 105 {beats}/min
Rest HR: 81 {beats}/min
SDS: 0
SRS: 5
SSS: 5
TID: 1.55

## 2016-06-16 DIAGNOSIS — M961 Postlaminectomy syndrome, not elsewhere classified: Secondary | ICD-10-CM | POA: Diagnosis not present

## 2016-06-16 DIAGNOSIS — G588 Other specified mononeuropathies: Secondary | ICD-10-CM | POA: Diagnosis not present

## 2016-06-16 DIAGNOSIS — M5417 Radiculopathy, lumbosacral region: Secondary | ICD-10-CM | POA: Diagnosis not present

## 2016-06-29 ENCOUNTER — Other Ambulatory Visit: Payer: Self-pay | Admitting: Medical

## 2016-06-29 NOTE — Telephone Encounter (Signed)
Is this okay to refill? 

## 2016-07-07 DIAGNOSIS — G588 Other specified mononeuropathies: Secondary | ICD-10-CM | POA: Diagnosis not present

## 2016-07-10 ENCOUNTER — Ambulatory Visit (HOSPITAL_BASED_OUTPATIENT_CLINIC_OR_DEPARTMENT_OTHER): Payer: Medicare Other | Attending: Medical | Admitting: Internal Medicine

## 2016-07-10 VITALS — Ht 65.0 in | Wt 206.0 lb

## 2016-07-10 DIAGNOSIS — R51 Headache: Secondary | ICD-10-CM | POA: Insufficient documentation

## 2016-07-10 DIAGNOSIS — G4761 Periodic limb movement disorder: Secondary | ICD-10-CM | POA: Diagnosis not present

## 2016-07-10 DIAGNOSIS — E119 Type 2 diabetes mellitus without complications: Secondary | ICD-10-CM | POA: Insufficient documentation

## 2016-07-10 DIAGNOSIS — I10 Essential (primary) hypertension: Secondary | ICD-10-CM | POA: Diagnosis not present

## 2016-07-10 DIAGNOSIS — G4719 Other hypersomnia: Secondary | ICD-10-CM | POA: Diagnosis not present

## 2016-07-10 DIAGNOSIS — G4733 Obstructive sleep apnea (adult) (pediatric): Secondary | ICD-10-CM

## 2016-07-10 DIAGNOSIS — J449 Chronic obstructive pulmonary disease, unspecified: Secondary | ICD-10-CM | POA: Diagnosis not present

## 2016-07-18 DIAGNOSIS — G4733 Obstructive sleep apnea (adult) (pediatric): Secondary | ICD-10-CM | POA: Diagnosis not present

## 2016-07-18 NOTE — Procedures (Signed)
  Patient Name: Robyn Hill, Robyn Hill Date: 07/10/2016 Gender: Female D.O.B: Sep 08, 1946 Age (years): 39 Referring Provider: Chana Bode Height (inches): 38 Interpreting Physician: Baird Lyons MD, ABSM Weight (lbs): 206 RPSGT: Jonna Coup BMI: 34 MRN: [id number] Neck Size: 14.00 CLINICAL INFORMATION Sleep Study Type: NPSG Indication for sleep study: COPD, Diabetes, Excessive Daytime Sleepiness, Hypertension, Morning Headaches Epworth Sleepiness Score: 17  SLEEP STUDY TECHNIQUE As per the AASM Manual for the Scoring of Sleep and Associated Events v2.3 (April 2016) with a hypopnea requiring 4% desaturations. The channels recorded and monitored were frontal, central and occipital EEG, electrooculogram (EOG), submentalis EMG (chin), nasal and oral airflow, thoracic and abdominal wall motion, anterior tibialis EMG, snore microphone, electrocardiogram, and pulse oximetry.  MEDICATIONS Patient's medications include: charted for review. Medications self-administered by patient during sleep study : hydrocodone.  SLEEP ARCHITECTURE The study was initiated at 10:47:58 PM and ended at 4:55:13 AM. Sleep onset time was 25.8 minutes and the sleep efficiency was 78.3%. The total sleep time was 287.5 minutes. Stage REM latency was N/A minutes. The patient spent 2.09% of the night in stage N1 sleep, 97.91% in stage N2 sleep, 0.00% in stage N3 and 0.00% in REM. Alpha intrusion was absent. Supine sleep was 0.00%. Fragmented sleep with frequent awakenings- Wake after sleeponset 54 minutes  RESPIRATORY PARAMETERS The overall apnea/hypopnea index (AHI) was 0.2 per hour. There were 0 total apneas, including 0 obstructive, 0 central and 0 mixed apneas. There were 1 hypopneas and 0 RERAs. The AHI during Stage REM sleep was N/A per hour. AHI while supine was N/A per hour. The mean oxygen saturation was 93.77%. The minimum SpO2 during sleep was 89.00%. Soft snoring was noted during this  study.  CARDIAC DATA The 2 lead EKG demonstrated sinus rhythm. The mean heart rate was 73.60 beats per minute. Other EKG findings include: PVCs.  LEG MOVEMENT DATA The total PLMS were 1024 with a resulting PLMS index of 213.74. Associated arousal with leg movement index was 14.2 .  IMPRESSIONS - No significant obstructive sleep apnea occurred during this study (AHI = 0.2/h). - No significant central sleep apnea occurred during this study (CAI = 0.0/h). - The patient had minimal or no oxygen desaturation during the study (Min O2 = 89.00%) - The patient snored with Soft snoring volume. - EKG findings include PVCs. - Severe periodic limb movements of sleep occurred during the study. Associated arousals were significant. Frequent brief awakenings. - Sleep position restricted to side due to back pain despite hydrocodone and nerve stimulator.  DIAGNOSIS - Periodic Limb Movement Syndrome (327.51 [G47.61 ICD-10])  RECOMMENDATIONS - Consider specific therapy for periodic limb movement sleep disorder. - Avoid alcohol, sedatives and other CNS depressants that may worsen sleep apnea and disrupt normal sleep architecture. - Sleep hygiene should be reviewed to assess factors that may improve sleep quality. - Weight management and regular exercise should be initiated or continued if appropriate.  [Electronically signed] 07/18/2016 08:27 AM  Baird Lyons MD, ABSM Diplomate, American Board of Sleep Medicine   NPI: [id number]  [place], American Board of Sleep Medicine  ELECTRONICALLY SIGNED ON:  07/18/2016, 8:21 AM [place] PH: [phone]   FX: [phone] [place]

## 2016-07-21 ENCOUNTER — Encounter (HOSPITAL_BASED_OUTPATIENT_CLINIC_OR_DEPARTMENT_OTHER): Payer: Medicare Other

## 2016-07-22 DIAGNOSIS — G588 Other specified mononeuropathies: Secondary | ICD-10-CM | POA: Diagnosis not present

## 2016-07-22 DIAGNOSIS — M961 Postlaminectomy syndrome, not elsewhere classified: Secondary | ICD-10-CM | POA: Diagnosis not present

## 2016-08-03 ENCOUNTER — Encounter: Payer: Self-pay | Admitting: Medical

## 2016-08-03 ENCOUNTER — Ambulatory Visit (INDEPENDENT_AMBULATORY_CARE_PROVIDER_SITE_OTHER): Payer: Medicare Other | Admitting: Medical

## 2016-08-03 VITALS — BP 102/66 | HR 79 | Temp 97.4°F | Ht 65.0 in | Wt 197.6 lb

## 2016-08-03 DIAGNOSIS — Z23 Encounter for immunization: Secondary | ICD-10-CM

## 2016-08-03 DIAGNOSIS — M549 Dorsalgia, unspecified: Secondary | ICD-10-CM

## 2016-08-03 DIAGNOSIS — D638 Anemia in other chronic diseases classified elsewhere: Secondary | ICD-10-CM | POA: Diagnosis not present

## 2016-08-03 DIAGNOSIS — E1141 Type 2 diabetes mellitus with diabetic mononeuropathy: Secondary | ICD-10-CM | POA: Diagnosis not present

## 2016-08-03 DIAGNOSIS — E785 Hyperlipidemia, unspecified: Secondary | ICD-10-CM

## 2016-08-03 DIAGNOSIS — G8929 Other chronic pain: Secondary | ICD-10-CM | POA: Diagnosis not present

## 2016-08-03 DIAGNOSIS — R5382 Chronic fatigue, unspecified: Secondary | ICD-10-CM | POA: Insufficient documentation

## 2016-08-03 DIAGNOSIS — G609 Hereditary and idiopathic neuropathy, unspecified: Secondary | ICD-10-CM

## 2016-08-03 DIAGNOSIS — R5383 Other fatigue: Secondary | ICD-10-CM | POA: Insufficient documentation

## 2016-08-03 DIAGNOSIS — I10 Essential (primary) hypertension: Secondary | ICD-10-CM

## 2016-08-03 DIAGNOSIS — G4761 Periodic limb movement disorder: Secondary | ICD-10-CM | POA: Diagnosis not present

## 2016-08-03 DIAGNOSIS — D649 Anemia, unspecified: Secondary | ICD-10-CM | POA: Insufficient documentation

## 2016-08-03 LAB — CBC
HCT: 42.2 % (ref 35.0–45.0)
Hemoglobin: 14.2 g/dL (ref 11.7–15.5)
MCH: 28.4 pg (ref 27.0–33.0)
MCHC: 33.6 g/dL (ref 32.0–36.0)
MCV: 84.4 fL (ref 80.0–100.0)
MPV: 10.8 fL (ref 7.5–12.5)
Platelets: 424 10*3/uL — ABNORMAL HIGH (ref 140–400)
RBC: 5 MIL/uL (ref 3.80–5.10)
RDW: 12.8 % (ref 11.0–15.0)
WBC: 13.1 10*3/uL — ABNORMAL HIGH (ref 4.0–10.5)

## 2016-08-03 MED ORDER — ROPINIROLE HCL 0.5 MG PO TABS: ORAL_TABLET | ORAL | 0 refills | Status: DC

## 2016-08-03 NOTE — Progress Notes (Signed)
Subjective: Chief Complaint  Patient presents with  . Medication Management  . Results    sleep    Here for follow up.   Had sleep study 07/10/16, no significant OSA.  There was minimal oxygen desaturation during study, however, there was severe periodica limb movements during sleep, associated arousals were significance.  Had frequent brief awakening.  She reports that the leg movement is due to her chronic pains from back and legs.     Saw Dr. Maryjean Ka recently at Neurosurgery, gave her another injection for pain. The injections didn't help.  She has went back to Dr. Maryjean Ka, and was started on Topamax, will soon be up to BID.  Still can't sleep on left side since January due to the pain.   Diabetes - glucose readings averaging 139.   Compliant with medication, using Bydureon weekly, Toujeo 12 u QHS, Metformin 1000mg  BID.   Compliant with Lipitor QHS without c/o.   Takes Cymbalta, but not sure she sees any benefit for this.  Taking iron daily.   Past Medical History:  Diagnosis Date  . Adrenal mass, right ([place])   . Allergy   . Anemia   . Asthma    "as a teenager"  . Blind right eye    since childhood  . Blood transfusion 1970  . Chronic back pain   . Chronic diarrhea    Colestipol therapy  . Depression   . Diverticulitis   . Diverticulosis   . Fatty liver   . Female bladder prolapse   . GERD (gastroesophageal reflux disease)   . Glaucoma   . H/O bone density study 11/29/2014   normal study although mild decreased in density from prior study  . H/O hiatal hernia   . Hemorrhoids   . History of kidney stones   . History of uterine cancer    s/p hysterectomy  . Hyperlipidemia   . Hypertension   . Migraine    hx of migraines in past   . Neuromuscular disorder ([place])    diabetic neuropahthy  . Numbness and tingling of both legs   . Osteoarthritis   . Rheumatic fever    age 4  . Type II diabetes mellitus ([place])   . Urinary incontinence   . Vitamin D deficiency     Current Outpatient Prescriptions on File Prior to Visit  Medication Sig Dispense Refill  . ACCU-CHEK AVIVA PLUS test strip TEST AS DIRECTED DAILY 300 each 1  . ACCU-CHEK SOFTCLIX LANCETS lancets TEST DAILY 100 each 5  . aspirin EC 81 MG tablet Take 1 tablet (81 mg total) by mouth daily. 90 tablet 3  . atorvastatin (LIPITOR) 40 MG tablet Take 1 tablet (40 mg total) by mouth daily. Taking at bedtime (Patient taking differently: Take 40 mg by mouth at bedtime. ) 90 tablet 3  . celecoxib (CELEBREX) 200 MG capsule Take 1 capsule (200 mg total) by mouth 2 (two) times daily. 180 capsule 3  . cholecalciferol (VITAMIN D) 1000 UNITS tablet Take 1,000 Units by mouth every morning.     Marland Kitchen DEXILANT 60 MG capsule TAKE 1 CAPSULE BY MOUTH TWICE DAILY 60 capsule 0  . dorzolamide-timolol (COSOPT) 22.3-6.8 MG/ML ophthalmic solution Place 1 drop into both eyes 2 (two) times daily.    . enalapril (VASOTEC) 20 MG tablet Take 1 tablet (20 mg total) by mouth daily. 90 tablet 3  . Exenatide ER (BYDUREON) 2 MG PEN Inject 2 mg into the skin once a week. 4 each 11  .  ferrous gluconate (FERGON) 324 MG tablet Take 324 mg by mouth daily with breakfast.    . hydrochlorothiazide (HYDRODIURIL) 25 MG tablet Take 1 tablet (25 mg total) by mouth daily. 90 tablet 3  . HYDROcodone-acetaminophen (NORCO) 10-325 MG per tablet Take 1-2 tablets by mouth every 4 (four) hours as needed for moderate pain or severe pain.     . Insulin Glargine (TOUJEO SOLOSTAR) 300 UNIT/ML SOPN Inject 10 Units into the skin at bedtime. (Patient taking differently: Inject 12 Units into the skin at bedtime. ) 3 mL 11  . Insulin Pen Needle (PEN NEEDLES) 31G X 6 MM MISC Use these for Toujeo pen. 200 each 11  . latanoprost (XALATAN) 0.005 % ophthalmic solution Place 1 drop into the right eye at bedtime.    . metFORMIN (GLUCOPHAGE) 1000 MG tablet Take 1 tablet (1,000 mg total) by mouth 2 (two) times daily with a meal. 180 tablet 3  . mupirocin ointment (BACTROBAN)  2 % Apply 1 application topically 2 (two) times daily as needed. Reported on 04/24/2016    . triamcinolone cream (KENALOG) 0.1 % Apply 1 application topically as needed (for skin irritation). Reported on 04/24/2016    . B-D UF III MINI PEN NEEDLES 31G X 5 MM MISC USE TO INJECT TOUJEO 100 each 2   No current facility-administered medications on file prior to visit.     ROS as subjective   Objective: BP 102/66 (BP Location: Left Arm, Patient Position: Sitting, Cuff Size: Large)   Pulse 79   Temp 97.4 F (36.3 C) (Oral)   Ht 5\' 5"  (1.651 m)   Wt 197 lb 9.6 oz (89.6 kg)   SpO2 98%   BMI 32.88 kg/m   Gen: wd, wn, nad Midline low back surgical scar, midline mid back surgical scar, right thoracolumbar region with 2 diagonal surgical scars, one from prior lipoma excision, the inferior one from recent spinal stimulator procedure Neck: supple, no lymphadenopathy, no thyromegaly, no masses, no bruits Heart: RRR, normal S1, S2, no murmurs Lungs: CTA bilaterally, no wheezes, rhonchi, or rales Musculoskeletal: mild bony no swelling, no obvious deformity arthritic changes noted of hands bilat, otherwise nontender, relatively normal UE and LE ROM without deformity Back: tender midline lumbar region, pain with flexion and extension which is slow Extremities: no edema, no cyanosis, no clubbing Pulses: 1+ symmetric, upper and lower extremities, normal cap refill Neurological: decreased sensation of feet with monofilament bilat, alert, oriented x 3, CN2-12 intact, strength normal upper extremities and lower extremities, sensation normal throughout, DTRs 2+ throughout, no cerebellar signs, gait normal Psychiatric: normal affect, behavior normal, pleasant    Assessment: Encounter Diagnoses  Name Primary?  . Periodic limb movement disorder Yes  . Need for prophylactic vaccination and inoculation against influenza   . Type 2 diabetes mellitus with diabetic mononeuropathy, without long-term current use of  insulin ([place])   . Idiopathic peripheral neuropathy   . Essential hypertension, benign   . Hyperlipidemia, unspecified hyperlipidemia type   . Chronic back pain, unspecified back location, unspecified back pain laterality   . Chronic fatigue   . Anemia of chronic disease      Plan: Reviewed recent sleep study results.   No significant OSA but severe limb movement disorder.   Begin trial of Requip. Counseled on the influenza virus vaccine.  Vaccine information sheet given.  Influenza vaccine given after consent obtained. Labs today regarding diabetes, lipids. HTN  And hyperlipidemia - labs today, c/t same medication Chronic back pain - managed  by neurosurgery Chronic fatigue - labs today Anemia - labs today Wean down to 1 Cymbalta today, if not major changes in 1 week, then go to every other day for 1 week then stop the medication  Damyah was seen today for medication management and results.  Diagnoses and all orders for this visit:  Periodic limb movement disorder -     Vitamin B12 -     VITAMIN D 25 Hydroxy (Vit-D Deficiency, Fractures)  Need for prophylactic vaccination and inoculation against influenza -     Flu vaccine HIGH DOSE PF  Type 2 diabetes mellitus with diabetic mononeuropathy, without long-term current use of insulin (HCC) -     Hemoglobin A1c -     Comprehensive metabolic panel  Idiopathic peripheral neuropathy -     Vitamin B12 -     VITAMIN D 25 Hydroxy (Vit-D Deficiency, Fractures)  Essential hypertension, benign -     Hemoglobin A1c -     Comprehensive metabolic panel -     Vitamin B12 -     VITAMIN D 25 Hydroxy (Vit-D Deficiency, Fractures)  Hyperlipidemia, unspecified hyperlipidemia type  Chronic back pain, unspecified back location, unspecified back pain laterality -     Vitamin B12 -     VITAMIN D 25 Hydroxy (Vit-D Deficiency, Fractures)  Chronic fatigue  Anemia of chronic disease -     Iron and TIBC -     CBC  Other orders -      rOPINIRole (REQUIP) 0.5 MG tablet; Begin 1 tablet QHS daily for limb movement disorder

## 2016-08-04 ENCOUNTER — Other Ambulatory Visit: Payer: Self-pay | Admitting: Medical

## 2016-08-04 LAB — COMPREHENSIVE METABOLIC PANEL
ALT: 34 U/L — ABNORMAL HIGH (ref 6–29)
AST: 25 U/L (ref 10–35)
Albumin: 4.4 g/dL (ref 3.6–5.1)
Alkaline Phosphatase: 97 U/L (ref 33–130)
BUN: 31 mg/dL — ABNORMAL HIGH (ref 7–25)
CO2: 23 mmol/L (ref 20–31)
Calcium: 10.1 mg/dL (ref 8.6–10.4)
Chloride: 97 mmol/L — ABNORMAL LOW (ref 98–110)
Creat: 1.19 mg/dL — ABNORMAL HIGH (ref 0.60–0.93)
Glucose, Bld: 121 mg/dL — ABNORMAL HIGH (ref 65–99)
Potassium: 5 mmol/L (ref 3.5–5.3)
Sodium: 135 mmol/L (ref 135–146)
Total Bilirubin: 0.5 mg/dL (ref 0.2–1.2)
Total Protein: 7.3 g/dL (ref 6.1–8.1)

## 2016-08-04 LAB — HEMOGLOBIN A1C
Hgb A1c MFr Bld: 6.9 % — ABNORMAL HIGH (ref <5.7)
Mean Plasma Glucose: 151 mg/dL

## 2016-08-04 LAB — VITAMIN B12: Vitamin B-12: 467 pg/mL (ref 200–1100)

## 2016-08-04 LAB — VITAMIN D 25 HYDROXY (VIT D DEFICIENCY, FRACTURES): Vit D, 25-Hydroxy: 26 ng/mL — ABNORMAL LOW (ref 30–100)

## 2016-08-04 LAB — IRON AND TIBC
%SAT: 42 % (ref 11–50)
Iron: 145 ug/dL (ref 45–160)
TIBC: 343 ug/dL (ref 250–450)
UIBC: 198 ug/dL (ref 125–400)

## 2016-08-04 MED ORDER — METFORMIN HCL 1000 MG PO TABS: 500.0000 mg | ORAL_TABLET | Freq: Two times a day (BID) | ORAL | 3 refills | Status: DC

## 2016-08-04 NOTE — Progress Notes (Signed)
Labs show vitamin D was low.   The most notable change was an in crease in the creatinine/kidney marker.  White cell count is chronically elevated.  recommendations 1- I want her to change/decrease to 1/2 tablet metformin BID 2- increase Toujeo to 14 u QHS 3-begin trial of Requip at bedtime only for leg movement disorder 4-cut back to 1 tablet daily on Cymbalta for the next 2 weeks 5-cut back to 1 Celebrex daily given the kidney findings 6-drink a little more water throughout the day as the labs suggest not enough clear fluid intake 7-begin weekly vit D prescription.  F/u 2wk and repeat kidney lab at that time.

## 2016-08-06 ENCOUNTER — Telehealth: Payer: Self-pay

## 2016-08-06 NOTE — Telephone Encounter (Signed)
Patient called in to advise Rx for Vit D once weekly not at pharmacy.  Advised would fwd to PA-Shane.  Rx should go to [facility] listed on file.

## 2016-08-07 ENCOUNTER — Other Ambulatory Visit: Payer: Self-pay | Admitting: Medical

## 2016-08-07 MED ORDER — VITAMIN D (ERGOCALCIFEROL) 1.25 MG (50000 UNIT) PO CAPS: 50000.0000 [IU] | ORAL_CAPSULE | ORAL | 3 refills | Status: DC

## 2016-08-18 DIAGNOSIS — Z1231 Encounter for screening mammogram for malignant neoplasm of breast: Secondary | ICD-10-CM | POA: Diagnosis not present

## 2016-08-18 LAB — HM MAMMOGRAPHY

## 2016-08-19 LAB — HM MAMMOGRAPHY: HM Mammogram: NORMAL (ref 0–4)

## 2016-08-26 ENCOUNTER — Ambulatory Visit (INDEPENDENT_AMBULATORY_CARE_PROVIDER_SITE_OTHER): Payer: Medicare Other | Admitting: Medical

## 2016-08-26 ENCOUNTER — Encounter: Payer: Self-pay | Admitting: Medical

## 2016-08-26 VITALS — BP 112/78 | HR 78 | Wt 204.8 lb

## 2016-08-26 DIAGNOSIS — E559 Vitamin D deficiency, unspecified: Secondary | ICD-10-CM | POA: Insufficient documentation

## 2016-08-26 DIAGNOSIS — Z8639 Personal history of other endocrine, nutritional and metabolic disease: Secondary | ICD-10-CM | POA: Diagnosis not present

## 2016-08-26 DIAGNOSIS — E118 Type 2 diabetes mellitus with unspecified complications: Secondary | ICD-10-CM

## 2016-08-26 DIAGNOSIS — D72829 Elevated white blood cell count, unspecified: Secondary | ICD-10-CM | POA: Diagnosis not present

## 2016-08-26 DIAGNOSIS — I10 Essential (primary) hypertension: Secondary | ICD-10-CM | POA: Diagnosis not present

## 2016-08-26 DIAGNOSIS — G8929 Other chronic pain: Secondary | ICD-10-CM | POA: Diagnosis not present

## 2016-08-26 DIAGNOSIS — R258 Other abnormal involuntary movements: Secondary | ICD-10-CM

## 2016-08-26 DIAGNOSIS — M961 Postlaminectomy syndrome, not elsewhere classified: Secondary | ICD-10-CM | POA: Diagnosis not present

## 2016-08-26 DIAGNOSIS — R7989 Other specified abnormal findings of blood chemistry: Secondary | ICD-10-CM

## 2016-08-26 NOTE — Progress Notes (Signed)
Subjective: Chief Complaint  Patient presents with  . 2 week follow up    follow up from vit d and meds changes    Here for follow up.   Last visit 08/03/16 we began Vit D weekly due to low levels, we increased Toujeo to 14u QHS, but we cut Celebrex to 1 tablet daily, cut down metformin to 1/2 tablet BID given creatinine elevation.  She has no prior issue with kidney issues.   This may have just been transient or lab fluctuation/error.  We planned to repeat BMET today.  Last visit we started Requip 0.5mg  daily for leg movement issue at night, seeing some improvement.  She weaned down to Cymbalta 60mg  once daily, but doesn't want to completley quit it.  Helps with mood.  Doing relatively ok on topamax 50mg  BID currently for pain through Dr. Maryjean Ka.   Past Medical History:  Diagnosis Date  . Adrenal mass, right ([place])   . Allergy   . Anemia   . Asthma    "as a teenager"  . Blind right eye    since childhood  . Blood transfusion 1970  . Chronic back pain   . Chronic diarrhea    Colestipol therapy  . Depression   . Diverticulitis   . Diverticulosis   . Fatty liver   . Female bladder prolapse   . GERD (gastroesophageal reflux disease)   . Glaucoma   . H/O bone density study 11/29/2014   normal study although mild decreased in density from prior study  . H/O hiatal hernia   . Hemorrhoids   . History of kidney stones   . History of uterine cancer    s/p hysterectomy  . Hyperlipidemia   . Hypertension   . Migraine    hx of migraines in past   . Neuromuscular disorder ([place])    diabetic neuropahthy  . Numbness and tingling of both legs   . Osteoarthritis   . Rheumatic fever    age 1  . Type II diabetes mellitus ([place])   . Urinary incontinence   . Vitamin D deficiency    Current Outpatient Prescriptions on File Prior to Visit  Medication Sig Dispense Refill  . ACCU-CHEK AVIVA PLUS test strip TEST AS DIRECTED DAILY 300 each 1  . ACCU-CHEK SOFTCLIX LANCETS lancets TEST  DAILY 100 each 5  . aspirin EC 81 MG tablet Take 1 tablet (81 mg total) by mouth daily. 90 tablet 3  . atorvastatin (LIPITOR) 40 MG tablet Take 1 tablet (40 mg total) by mouth daily. Taking at bedtime (Patient taking differently: Take 40 mg by mouth at bedtime. ) 90 tablet 3  . B-D UF III MINI PEN NEEDLES 31G X 5 MM MISC USE TO INJECT TOUJEO 100 each 2  . dorzolamide-timolol (COSOPT) 22.3-6.8 MG/ML ophthalmic solution Place 1 drop into both eyes 2 (two) times daily.    . enalapril (VASOTEC) 20 MG tablet Take 1 tablet (20 mg total) by mouth daily. 90 tablet 3  . ferrous gluconate (FERGON) 324 MG tablet Take 324 mg by mouth daily with breakfast.    . hydrochlorothiazide (HYDRODIURIL) 25 MG tablet Take 1 tablet (25 mg total) by mouth daily. 90 tablet 3  . HYDROcodone-acetaminophen (NORCO) 10-325 MG per tablet Take 1-2 tablets by mouth every 4 (four) hours as needed for moderate pain or severe pain.     . Insulin Glargine (TOUJEO SOLOSTAR) 300 UNIT/ML SOPN Inject 10 Units into the skin at bedtime. (Patient taking differently: Inject 12  Units into the skin at bedtime. ) 3 mL 11  . Insulin Pen Needle (PEN NEEDLES) 31G X 6 MM MISC Use these for Toujeo pen. 200 each 11  . latanoprost (XALATAN) 0.005 % ophthalmic solution Place 1 drop into the right eye at bedtime.    . metFORMIN (GLUCOPHAGE) 1000 MG tablet Take 0.5 tablets (500 mg total) by mouth 2 (two) times daily with a meal. 180 tablet 3  . mupirocin ointment (BACTROBAN) 2 % Apply 1 application topically 2 (two) times daily as needed. Reported on 04/24/2016    . topiramate (TOPAMAX) 25 MG tablet Take 1 tablet by mouth 2 (two) times daily.  2  . triamcinolone cream (KENALOG) 0.1 % Apply 1 application topically as needed (for skin irritation). Reported on 04/24/2016    . Vitamin D, Ergocalciferol, (DRISDOL) 50000 units CAPS capsule Take 1 capsule (50,000 Units total) by mouth every 7 (seven) days. 12 capsule 3  . DEXILANT 60 MG capsule TAKE 1 CAPSULE BY MOUTH  TWICE DAILY 60 capsule 0  . Exenatide ER (BYDUREON) 2 MG PEN Inject 2 mg into the skin once a week. 4 each 11   No current facility-administered medications on file prior to visit.     ROS as subjective   Objective: BP 112/78   Pulse 78   Wt 204 lb 12.8 oz (92.9 kg)   SpO2 98%   BMI 34.08 kg/m   Wt Readings from Last 3 Encounters:  08/26/16 204 lb 12.8 oz (92.9 kg)  08/03/16 197 lb 9.6 oz (89.6 kg)  07/10/16 206 lb (93.4 kg)    Gen: wd, wn, nad Psychiatric: normal affect, behavior normal, pleasant      Assessment: Encounter Diagnoses  Name Primary?  . Creatinine elevation Yes  . History of thyroid disease   . Essential hypertension, benign   . Leukocytosis, unspecified type   . Other chronic pain   . Diabetes mellitus with complication ([place])   . Nocturnal leg movements   . Vitamin D deficiency      Plan: nocturnal leg movements - still not resting as well as we'd like but improved.  Increase Requip to 0.5mg , 2 tablets QHS or 1mg  QHS.   She will call back in a few weeks to let me know how this is doing.  If improved, will refill at 1mg  daily  C/t Vit D weekly but she will call insurance to verify scripts as I sent for 90 day supply and they only filled 30 day supply  Labs today but c/t metformin 1/2 tablet daily for now.  Was on 1/2 tablet BID, but last visit I changed to 1/2 tablet BID.  However, she was mistakenly taking 1/2 tablet daily.   F/u pending labs.  C/t toujeo at 14u QHS  C/t Cymbalta 60mg  daily for now.  She didn't want to completely stop this.    F/u with Dr. Maryjean Ka for pain management.  Seeing some improvement on Topamax 50mg  BID.  Leukocytosis - c/t to monitor, stable. Has seen hematology in past few years for this.  Robyn Hill was seen today for 2 week follow up.  Diagnoses and all orders for this visit:  Creatinine elevation -     Basic metabolic panel  History of thyroid disease -     TSH  Essential hypertension, benign  Leukocytosis,  unspecified type  Other chronic pain  Diabetes mellitus with complication (HCC)  Nocturnal leg movements  Vitamin D deficiency

## 2016-08-27 LAB — BASIC METABOLIC PANEL
BUN: 22 mg/dL (ref 7–25)
CO2: 24 mmol/L (ref 20–31)
Calcium: 9.6 mg/dL (ref 8.6–10.4)
Chloride: 103 mmol/L (ref 98–110)
Creat: 0.65 mg/dL (ref 0.60–0.93)
Glucose, Bld: 186 mg/dL — ABNORMAL HIGH (ref 65–99)
Potassium: 4.3 mmol/L (ref 3.5–5.3)
Sodium: 137 mmol/L (ref 135–146)

## 2016-08-27 LAB — TSH: TSH: 0.58 mIU/L

## 2016-09-21 ENCOUNTER — Ambulatory Visit (INDEPENDENT_AMBULATORY_CARE_PROVIDER_SITE_OTHER): Payer: Medicare Other | Admitting: Medical

## 2016-09-21 ENCOUNTER — Encounter: Payer: Self-pay | Admitting: Medical

## 2016-09-21 VITALS — BP 128/70 | HR 69 | Wt 205.4 lb

## 2016-09-21 DIAGNOSIS — G609 Hereditary and idiopathic neuropathy, unspecified: Secondary | ICD-10-CM

## 2016-09-21 DIAGNOSIS — M545 Low back pain: Secondary | ICD-10-CM

## 2016-09-21 DIAGNOSIS — M1990 Unspecified osteoarthritis, unspecified site: Secondary | ICD-10-CM

## 2016-09-21 DIAGNOSIS — G8929 Other chronic pain: Secondary | ICD-10-CM | POA: Diagnosis not present

## 2016-09-21 DIAGNOSIS — Z9889 Other specified postprocedural states: Secondary | ICD-10-CM

## 2016-09-21 DIAGNOSIS — K5903 Drug induced constipation: Secondary | ICD-10-CM

## 2016-09-21 DIAGNOSIS — M549 Dorsalgia, unspecified: Secondary | ICD-10-CM

## 2016-09-21 DIAGNOSIS — E118 Type 2 diabetes mellitus with unspecified complications: Secondary | ICD-10-CM

## 2016-09-21 DIAGNOSIS — T402X5A Adverse effect of other opioids, initial encounter: Secondary | ICD-10-CM | POA: Diagnosis not present

## 2016-09-21 DIAGNOSIS — R52 Pain, unspecified: Secondary | ICD-10-CM | POA: Diagnosis not present

## 2016-09-21 DIAGNOSIS — H401133 Primary open-angle glaucoma, bilateral, severe stage: Secondary | ICD-10-CM | POA: Diagnosis not present

## 2016-09-21 NOTE — Progress Notes (Signed)
Subjective: Chief Complaint  Patient presents with  . back pain    back pain    Here for f/u on burning and back pain.   She has hx/o chronic back pain that has worsened along with her peripheral diabetic neuropathy and arthritis over the past few years.  She has been on chronic narcotic pain medication for several years now.   She ended up having spinal cord stimulator placed in right low back in January or February of this year.   She reports now and was reporting at prior visits that she was having burning pain in the area of the spinal cord stimulator incision site/area that started after the surgery.  She has continued to have burning pains, moderate to severe for moths now, not improving.   Pain will wake her out of sleep to the severity she just sits there crying.   Pain is throughout the day and night.  Despite several medications now and prior including Gabapentin, Lyrica, Topamax, Nortriptyline, Hydrocodone, she still gets no relief of this burning pain which is specific to right lower back at the site of the prior spinal cord stimulator placement.  Since she has been having this pain, the stimulator was taken out and moved lower in the right low back weeks after the initial surgery.   She is here as she has already discussed this with neurosurgery, both Dr. Vertell Limber and Dr. Lovenia Shuck, and she feels that their answer is that the pain will eventually go away, but its been 10 months now and not improving.    She feels she can take the pain.  Of note, she has area in low back where cyst was removed from the skin.  This is just above the incision site for the spinal cord stimulator.    She notes constipation, has BM q2-3 days, for the past month or 2, worse being on pain medication.   Uses prune juice to help with constipation.  Denies abominable pain, NVD.    Review of Systems Constitutional: -fever, -chills, -sweats, -unexpected weight change, -fatigue ENT: -runny nose, -ear pain, -sore  throat Cardiology:  -chest pain, -palpitations, -edema Respiratory: -cough, -shortness of breath, -wheezing Gastroenterology: -abdominal pain, -nausea, -vomiting, -diarrhea, +constipation since begin on more pain medication Hematology: -bleeding or bruising problems Musculoskeletal: -arthralgias, -myalgias, -joint swelling, -back pain Ophthalmology: -vision changes Urology: -dysuria, -difficulty urinating, -hematuria, -urinary frequency, -urgency Neurology: -headache, -weakness, -tingling, +numbness in legs, hx/o neuropathy, both legs No bowel or bladder incontinence  Past Medical History:  Diagnosis Date  . Adrenal mass, right ([place])   . Allergy   . Anemia   . Asthma    "as a teenager"  . Blind right eye    since childhood  . Blood transfusion 1970  . Chronic back pain   . Chronic diarrhea    Colestipol therapy  . Depression   . Diverticulitis   . Diverticulosis   . Fatty liver   . Female bladder prolapse   . GERD (gastroesophageal reflux disease)   . Glaucoma   . H/O bone density study 11/29/2014   normal study although mild decreased in density from prior study  . H/O hiatal hernia   . Hemorrhoids   . History of kidney stones   . History of uterine cancer    s/p hysterectomy  . Hyperlipidemia   . Hypertension   . Migraine    hx of migraines in past   . Neuromuscular disorder ([place])    diabetic neuropahthy  .  Numbness and tingling of both legs   . Osteoarthritis   . Rheumatic fever    age 54  . Type II diabetes mellitus ([place])   . Urinary incontinence   . Vitamin D deficiency    Current Outpatient Prescriptions on File Prior to Visit  Medication Sig Dispense Refill  . ACCU-CHEK AVIVA PLUS test strip TEST AS DIRECTED DAILY 300 each 1  . ACCU-CHEK SOFTCLIX LANCETS lancets TEST DAILY 100 each 5  . aspirin EC 81 MG tablet Take 1 tablet (81 mg total) by mouth daily. 90 tablet 3  . atorvastatin (LIPITOR) 40 MG tablet Take 1 tablet (40 mg total) by mouth daily. Taking  at bedtime (Patient taking differently: Take 40 mg by mouth at bedtime. ) 90 tablet 3  . B-D UF III MINI PEN NEEDLES 31G X 5 MM MISC USE TO INJECT TOUJEO 100 each 2  . DEXILANT 60 MG capsule TAKE 1 CAPSULE BY MOUTH TWICE DAILY 60 capsule 0  . dorzolamide-timolol (COSOPT) 22.3-6.8 MG/ML ophthalmic solution Place 1 drop into both eyes 2 (two) times daily.    . enalapril (VASOTEC) 20 MG tablet Take 1 tablet (20 mg total) by mouth daily. 90 tablet 3  . Exenatide ER (BYDUREON) 2 MG PEN Inject 2 mg into the skin once a week. 4 each 11  . ferrous gluconate (FERGON) 324 MG tablet Take 324 mg by mouth daily with breakfast.    . hydrochlorothiazide (HYDRODIURIL) 25 MG tablet Take 1 tablet (25 mg total) by mouth daily. 90 tablet 3  . HYDROcodone-acetaminophen (NORCO) 10-325 MG per tablet Take 1-2 tablets by mouth every 4 (four) hours as needed for moderate pain or severe pain.     . Insulin Glargine (TOUJEO SOLOSTAR) 300 UNIT/ML SOPN Inject 10 Units into the skin at bedtime. (Patient taking differently: Inject 12 Units into the skin at bedtime. ) 3 mL 11  . Insulin Pen Needle (PEN NEEDLES) 31G X 6 MM MISC Use these for Toujeo pen. 200 each 11  . latanoprost (XALATAN) 0.005 % ophthalmic solution Place 1 drop into the right eye at bedtime.    . metFORMIN (GLUCOPHAGE) 1000 MG tablet Take 0.5 tablets (500 mg total) by mouth 2 (two) times daily with a meal. 180 tablet 3  . mupirocin ointment (BACTROBAN) 2 % Apply 1 application topically 2 (two) times daily as needed. Reported on 04/24/2016    . triamcinolone cream (KENALOG) 0.1 % Apply 1 application topically as needed (for skin irritation). Reported on 04/24/2016    . Vitamin D, Ergocalciferol, (DRISDOL) 50000 units CAPS capsule Take 1 capsule (50,000 Units total) by mouth every 7 (seven) days. 12 capsule 3   No current facility-administered medications on file prior to visit.     Objective: BP 128/70   Pulse 69   Wt 205 lb 6.4 oz (93.2 kg)   SpO2 99%   BMI  34.18 kg/m   Gen: wd, wn, nad Skin: upper lumbar and lower lumbar spine with 2 separate vertical surgical scars, right lower back with 3 separate horizontal surgical scars.  The middle one in the lower lumbar region is tender with deep palpation but no swelling, no warmth or frank erythema.   The lower low back right surgical scar has a fullness palpated in the deeper tissue c/w a medical device/spinal cord stimulator device the upper lumbar surgical scar is nontender with a lateral lump/cyst unchanged from prior visit, likely 2cm diameter Back: otherwise nontender, pain with ROM in low back, she walks  somewhat bent over and gait is slow due to pain, ROM of back is limited markedly due to pain Legs nontender currently, hips without pain during ROM tests DTRs not appreciated of legs, legs 4-5/5 strength bilat Abdomen: +bs, soft, mild RLQ tenderness, but otherwise nontender, no mass, no organomegaly    Assessment: Encounter Diagnoses  Name Primary?  . Burning pain Yes  . Chronic right-sided low back pain, with sciatica presence unspecified   . Chronic back pain, unspecified back location, unspecified back pain laterality   . Other chronic pain   . Arthritis   . Idiopathic peripheral neuropathy   . Diabetes mellitus with complication ([place])   . Constipation due to opioid therapy      Plan: Burning pain in back, in setting of chronic back pain, hx/o back surgery - we will call back with suggestions after I discuss case with supervising physician and Dr. Maryjean Ka.  opoid inducted constipation - Begin trial of linzess. discussed water and fiber intake.

## 2016-09-23 ENCOUNTER — Telehealth: Payer: Self-pay | Admitting: Medical

## 2016-09-23 MED ORDER — LINACLOTIDE 145 MCG PO CAPS: 145.0000 ug | ORAL_CAPSULE | Freq: Every day | ORAL | 2 refills | Status: DC

## 2016-09-23 NOTE — Telephone Encounter (Signed)
Call about her recent visit.  Begin trial of Linzess once daily capsule to help with constipation.  If too expensive let me know and I'll send something else.  Regarding the burning pain, I spoke with my supervising physicians here and Dr. Maryjean Ka as well.  There is no good answer.  I do want her to double upon on Nortriptyline 25mg  to take 2 tablets daily instead of one and see if the gives any improvement.  The next step would be to possibly try something like acupuncture or cognitive behavioral therapy.  This would be different than something she has tried, and the usual place we refer for this is [facility] here in [place].  Another options is referral to neurology.  There is not a specific scan or test I can do that will help.     I wish I had a better answer, but have her try the increased dose of Nortriptyline and Linzess, and call back in a few weeks to see if any improvement.

## 2016-09-24 NOTE — Telephone Encounter (Signed)
Called patient gave this information she said that she will try the lizzness, and nortriptyline 2 tabs, will call us back to let us know if this work.

## 2016-10-02 ENCOUNTER — Ambulatory Visit: Payer: Medicare Other | Admitting: Cardiology

## 2016-10-21 ENCOUNTER — Telehealth: Payer: Self-pay | Admitting: Family Medicine

## 2016-10-21 DIAGNOSIS — R52 Pain, unspecified: Secondary | ICD-10-CM | POA: Diagnosis not present

## 2016-10-21 DIAGNOSIS — L905 Scar conditions and fibrosis of skin: Secondary | ICD-10-CM | POA: Diagnosis not present

## 2016-10-21 DIAGNOSIS — M961 Postlaminectomy syndrome, not elsewhere classified: Secondary | ICD-10-CM | POA: Diagnosis not present

## 2016-10-21 NOTE — Telephone Encounter (Signed)
error 

## 2016-10-22 ENCOUNTER — Telehealth: Payer: Self-pay | Admitting: Medical

## 2016-10-22 NOTE — Telephone Encounter (Signed)
Pt must get Diabetic Test Strips from [facility] [address] in [place] and they are requiring that pt bring hardcopy of script showing DX, ICD 10, quantity, # times that pt tests (pt tests twice a day). Pt would like for the hardcopy of the script to be mailed to her so that she can take it to the pharmacy.

## 2016-10-23 MED ORDER — GLUCOSE BLOOD VI STRP: ORAL_STRIP | 1 refills | Status: DC

## 2016-10-23 NOTE — Telephone Encounter (Signed)
Printed and will have shane sign. Sent pt mychart message to let her know she can pick up

## 2016-10-27 ENCOUNTER — Other Ambulatory Visit: Payer: Self-pay | Admitting: Medical

## 2016-10-27 ENCOUNTER — Telehealth: Payer: Self-pay

## 2016-10-27 MED ORDER — EXENATIDE ER 2 MG ~~LOC~~ PEN: 2.0000 mg | PEN_INJECTOR | SUBCUTANEOUS | 1 refills | Status: DC

## 2016-10-27 MED ORDER — LINACLOTIDE 145 MCG PO CAPS: 145.0000 ug | ORAL_CAPSULE | Freq: Every day | ORAL | 1 refills | Status: DC

## 2016-10-27 MED ORDER — VITAMIN D (ERGOCALCIFEROL) 1.25 MG (50000 UNIT) PO CAPS
50000.0000 [IU] | ORAL_CAPSULE | ORAL | 1 refills | Status: DC
Start: 2016-10-27 — End: 2016-11-23

## 2016-10-27 NOTE — Telephone Encounter (Signed)
Faxed request rcvd for pt refill 90 day on bydureon, vit D 50,000 units, and linzess. Robyn Hill

## 2016-10-27 NOTE — Telephone Encounter (Signed)
Sent to [facility]

## 2016-11-05 ENCOUNTER — Telehealth: Payer: Self-pay | Admitting: Medical

## 2016-11-05 ENCOUNTER — Ambulatory Visit: Payer: Medicare Other | Admitting: Medical

## 2016-11-05 ENCOUNTER — Ambulatory Visit (INDEPENDENT_AMBULATORY_CARE_PROVIDER_SITE_OTHER): Payer: Medicare Other | Admitting: Medical

## 2016-11-05 VITALS — BP 112/68 | HR 84 | Temp 97.8°F | Resp 18 | Ht 65.0 in | Wt 203.2 lb

## 2016-11-05 DIAGNOSIS — E118 Type 2 diabetes mellitus with unspecified complications: Secondary | ICD-10-CM | POA: Diagnosis not present

## 2016-11-05 DIAGNOSIS — R3915 Urgency of urination: Secondary | ICD-10-CM | POA: Diagnosis not present

## 2016-11-05 DIAGNOSIS — R8290 Unspecified abnormal findings in urine: Secondary | ICD-10-CM

## 2016-11-05 DIAGNOSIS — E109 Type 1 diabetes mellitus without complications: Secondary | ICD-10-CM

## 2016-11-05 DIAGNOSIS — R300 Dysuria: Secondary | ICD-10-CM

## 2016-11-05 DIAGNOSIS — R7309 Other abnormal glucose: Secondary | ICD-10-CM | POA: Diagnosis not present

## 2016-11-05 LAB — POCT URINALYSIS DIPSTICK
Bilirubin, UA: NEGATIVE
Blood, UA: NEGATIVE
Glucose, UA: NEGATIVE
Ketones, UA: NEGATIVE
Leukocytes, UA: NEGATIVE
Protein, UA: NEGATIVE
Spec Grav, UA: >=1.03
Urobilinogen, UA: NEGATIVE
pH, UA: 5

## 2016-11-05 MED ORDER — CIPROFLOXACIN HCL 500 MG PO TABS: 500.0000 mg | ORAL_TABLET | Freq: Two times a day (BID) | ORAL | 0 refills | Status: DC

## 2016-11-05 NOTE — Telephone Encounter (Signed)
Please call out B-cise pen to replace Bydureon.  Injection weekly, with 2 refills.

## 2016-11-05 NOTE — Progress Notes (Signed)
Subjective:  Robyn Hill is a 71 y.o. female who complains of possible urinary tract infection.   She notes back in mid December started having urinary spasms, but days later foul odor in urine.  Just recently less foul odor.  Been drinking cranberry juice, lots of water.   Still has urgency to go, sometime incontinence, but small amounts at a time.   Had UTI in the past on Invokana, but none since until December  No blood in urine.   No rash, no vaginal discharge.  Had flu like symptoms in December, very tired, bad cough, started in head, went to lungs, but this that finally resolved.  Sugars have been up and down of late.  Sometimes in the 90s, sometimes high 100s.   Using metformin 1000mg , 0.5mg  BID, bydureon weekly, and 14 units Toujeo QHS.   No other aggravating or relieving factors.  No other c/o.  Past Medical History:  Diagnosis Date  . Adrenal mass, right ([place])   . Allergy   . Anemia   . Asthma    "as a teenager"  . Blind right eye    since childhood  . Blood transfusion 1970  . Chronic back pain   . Chronic diarrhea    Colestipol therapy  . Depression   . Diverticulitis   . Diverticulosis   . Fatty liver   . Female bladder prolapse   . GERD (gastroesophageal reflux disease)   . Glaucoma   . H/O bone density study 11/29/2014   normal study although mild decreased in density from prior study  . H/O hiatal hernia   . Hemorrhoids   . History of kidney stones   . History of uterine cancer    s/p hysterectomy  . Hyperlipidemia   . Hypertension   . Migraine    hx of migraines in past   . Neuromuscular disorder ([place])    diabetic neuropahthy  . Numbness and tingling of both legs   . Osteoarthritis   . Rheumatic fever    age 17  . Type II diabetes mellitus ([place])   . Urinary incontinence   . Vitamin D deficiency    Current Outpatient Prescriptions on File Prior to Visit  Medication Sig Dispense Refill  . ACCU-CHEK SOFTCLIX LANCETS lancets TEST DAILY 100 each 5  .  aspirin EC 81 MG tablet Take 1 tablet (81 mg total) by mouth daily. 90 tablet 3  . atorvastatin (LIPITOR) 40 MG tablet Take 1 tablet (40 mg total) by mouth daily. Taking at bedtime (Patient taking differently: Take 40 mg by mouth at bedtime. ) 90 tablet 3  . B-D UF III MINI PEN NEEDLES 31G X 5 MM MISC USE TO INJECT TOUJEO 100 each 2  . DEXILANT 60 MG capsule TAKE 1 CAPSULE BY MOUTH TWICE DAILY 60 capsule 0  . dorzolamide-timolol (COSOPT) 22.3-6.8 MG/ML ophthalmic solution Place 1 drop into both eyes 2 (two) times daily.    . enalapril (VASOTEC) 20 MG tablet Take 1 tablet (20 mg total) by mouth daily. 90 tablet 3  . Exenatide ER (BYDUREON) 2 MG PEN Inject 2 mg into the skin once a week. 4 each 1  . ferrous gluconate (FERGON) 324 MG tablet Take 324 mg by mouth daily with breakfast.    . glucose blood (ACCU-CHEK AVIVA PLUS) test strip Test BID. Dx Code- E11.8 300 each 1  . hydrochlorothiazide (HYDRODIURIL) 25 MG tablet Take 1 tablet (25 mg total) by mouth daily. 90 tablet 3  . Insulin  Glargine (TOUJEO SOLOSTAR) 300 UNIT/ML SOPN Inject 10 Units into the skin at bedtime. (Patient taking differently: Inject 12 Units into the skin at bedtime. ) 3 mL 11  . Insulin Pen Needle (PEN NEEDLES) 31G X 6 MM MISC Use these for Toujeo pen. 200 each 11  . latanoprost (XALATAN) 0.005 % ophthalmic solution Place 1 drop into the right eye at bedtime.    Marland Kitchen linaclotide (LINZESS) 145 MCG CAPS capsule Take 1 capsule (145 mcg total) by mouth daily before breakfast. 90 capsule 1  . metFORMIN (GLUCOPHAGE) 1000 MG tablet Take 0.5 tablets (500 mg total) by mouth 2 (two) times daily with a meal. (Patient taking differently: Take 1,000 mg by mouth 2 (two) times daily with a meal. ) 180 tablet 3  . triamcinolone cream (KENALOG) 0.1 % Apply 1 application topically as needed (for skin irritation). Reported on 04/24/2016    . Vitamin D, Ergocalciferol, (DRISDOL) 50000 units CAPS capsule Take 1 capsule (50,000 Units total) by mouth every 7  (seven) days. 12 capsule 1  . mupirocin ointment (BACTROBAN) 2 % Apply 1 application topically 2 (two) times daily as needed. Reported on 04/24/2016     No current facility-administered medications on file prior to visit.     ROS as in subjective  Reviewed allergies, medications, past medical, surgical, and social history.    Objective: BP 112/68 (BP Location: Right Arm, Patient Position: Sitting, Cuff Size: Normal)   Pulse 84   Temp 97.8 F (36.6 C) (Oral)   Resp 18   Ht 5\' 5"  (1.651 m)   Wt 203 lb 3.2 oz (92.2 kg)   SpO2 98%   BMI 33.81 kg/m   General appearance: alert, no distress, WD/WN, female Abdomen: +bs, soft, non tender, non distended, no masses, no hepatomegaly, no splenomegaly, no bruits Back: no CVA tenderness GU: declined/deferred    Assessment: Encounter Diagnoses  Name Primary?  . Urinary urgency Yes  . Dysuria   . Abnormal urine odor   . Diabetes mellitus with complication ([place])   . Sugar blood level increased   . Brittle diabetes ([place])      Plan: Will treat empirically for UTI, urine culture sent, c/t good hydration.  Advised she check glucose TID and 2 hours post prandial.  C/t same medications, but change to B-cise as she has a lob of rouble with the needle and current pen device with bydureon.    Advised she return glucose readings.  Nourah was seen today for urinary tract infection.  Diagnoses and all orders for this visit:  Urinary urgency -     Cancel: POCT Urinalysis Dipstick -     POCT Urinalysis Dipstick -     Urine culture  Dysuria -     Urine culture  Abnormal urine odor -     Urine culture  Diabetes mellitus with complication ([place])  Sugar blood level increased  Brittle diabetes ([place])  Other orders -     ciprofloxacin (CIPRO) 500 MG tablet; Take 1 tablet (500 mg total) by mouth 2 (two) times daily.

## 2016-11-06 MED ORDER — EXENATIDE ER 2 MG/0.85ML ~~LOC~~ AUIJ: 2.0000 mg | AUTO-INJECTOR | SUBCUTANEOUS | 2 refills | Status: DC

## 2016-11-06 NOTE — Telephone Encounter (Signed)
Called in B-cise pen for pt to pharmacy on record.

## 2016-11-07 LAB — URINE CULTURE

## 2016-11-09 ENCOUNTER — Other Ambulatory Visit: Payer: Self-pay | Admitting: Medical

## 2016-11-09 MED ORDER — AMOXICILLIN-POT CLAVULANATE 875-125 MG PO TABS: 1.0000 | ORAL_TABLET | Freq: Two times a day (BID) | ORAL | 0 refills | Status: DC

## 2016-11-16 ENCOUNTER — Encounter: Payer: Self-pay | Admitting: Medical

## 2016-11-18 ENCOUNTER — Encounter: Payer: Self-pay | Admitting: Medical

## 2016-11-18 ENCOUNTER — Other Ambulatory Visit: Payer: Self-pay | Admitting: Medical

## 2016-11-18 ENCOUNTER — Other Ambulatory Visit: Payer: Self-pay

## 2016-11-18 DIAGNOSIS — E119 Type 2 diabetes mellitus without complications: Secondary | ICD-10-CM

## 2016-11-18 MED ORDER — ACCU-CHEK GUIDE CONTROL VI LIQD
1.0000 | 3 refills | Status: DC
Start: 2016-11-18 — End: 2016-11-23

## 2016-11-18 MED ORDER — GLUCOSE BLOOD VI STRP: ORAL_STRIP | 1 refills | Status: DC

## 2016-11-21 ENCOUNTER — Encounter: Payer: Self-pay | Admitting: Medical

## 2016-11-23 ENCOUNTER — Other Ambulatory Visit: Payer: Self-pay

## 2016-11-23 ENCOUNTER — Telehealth: Payer: Self-pay

## 2016-11-23 DIAGNOSIS — E119 Type 2 diabetes mellitus without complications: Secondary | ICD-10-CM

## 2016-11-23 MED ORDER — ACCU-CHEK SOFTCLIX LANCETS MISC: 5 refills | Status: DC

## 2016-11-23 MED ORDER — VITAMIN D (ERGOCALCIFEROL) 1.25 MG (50000 UNIT) PO CAPS
50000.0000 [IU] | ORAL_CAPSULE | ORAL | 1 refills | Status: DC
Start: 2016-11-23 — End: 2017-02-08

## 2016-11-23 MED ORDER — ACCU-CHEK GUIDE CONTROL VI LIQD: 1.0000 | 3 refills | Status: DC

## 2016-11-23 NOTE — Telephone Encounter (Signed)
Pt needs 90 day refill of Vit D 50000, bydureon 2mg , and linzess 135mcg to [facility] [place] per faxed request. Victor[place] December

## 2016-11-24 MED ORDER — ACCU-CHEK INSTANT CONTROL VI LIQD: 0 refills | Status: DC

## 2016-11-24 MED ORDER — ACCU-CHEK SOFT TOUCH LANCETS MISC: 5 refills | Status: DC

## 2016-11-27 ENCOUNTER — Ambulatory Visit (INDEPENDENT_AMBULATORY_CARE_PROVIDER_SITE_OTHER): Payer: Medicare Other | Admitting: Medical

## 2016-11-27 ENCOUNTER — Other Ambulatory Visit: Payer: Self-pay | Admitting: Medical

## 2016-11-27 ENCOUNTER — Other Ambulatory Visit: Payer: Self-pay

## 2016-11-27 VITALS — BP 124/76 | HR 77 | Wt 200.4 lb

## 2016-11-27 DIAGNOSIS — D72829 Elevated white blood cell count, unspecified: Secondary | ICD-10-CM | POA: Diagnosis not present

## 2016-11-27 DIAGNOSIS — E118 Type 2 diabetes mellitus with unspecified complications: Secondary | ICD-10-CM

## 2016-11-27 DIAGNOSIS — E785 Hyperlipidemia, unspecified: Secondary | ICD-10-CM | POA: Diagnosis not present

## 2016-11-27 DIAGNOSIS — D638 Anemia in other chronic diseases classified elsewhere: Secondary | ICD-10-CM

## 2016-11-27 DIAGNOSIS — N390 Urinary tract infection, site not specified: Secondary | ICD-10-CM

## 2016-11-27 DIAGNOSIS — I10 Essential (primary) hypertension: Secondary | ICD-10-CM

## 2016-11-27 LAB — CBC WITH DIFFERENTIAL/PLATELET
Basophils Absolute: 0 cells/uL (ref 0–200)
Basophils Relative: 0 %
Eosinophils Absolute: 216 cells/uL (ref 15–500)
Eosinophils Relative: 2 %
HCT: 41.8 % (ref 35.0–45.0)
Hemoglobin: 13.9 g/dL (ref 11.7–15.5)
Lymphocytes Relative: 28 %
Lymphs Abs: 3024 cells/uL (ref 850–3900)
MCH: 28.1 pg (ref 27.0–33.0)
MCHC: 33.3 g/dL (ref 32.0–36.0)
MCV: 84.4 fL (ref 80.0–100.0)
MPV: 10.1 fL (ref 7.5–12.5)
Monocytes Absolute: 756 cells/uL (ref 200–950)
Monocytes Relative: 7 %
Neutro Abs: 6804 cells/uL (ref 1500–7800)
Neutrophils Relative %: 63 %
Platelets: 377 10*3/uL (ref 140–400)
RBC: 4.95 MIL/uL (ref 3.80–5.10)
RDW: 12.8 % (ref 11.0–15.0)
WBC: 10.8 10*3/uL — ABNORMAL HIGH (ref 4.0–10.5)

## 2016-11-27 LAB — POCT URINALYSIS DIPSTICK
Bilirubin, UA: NEGATIVE
Blood, UA: NEGATIVE
Glucose, UA: NEGATIVE
Ketones, UA: NEGATIVE
Nitrite, UA: NEGATIVE
Protein, UA: NEGATIVE
Spec Grav, UA: >=1.03
Urobilinogen, UA: NEGATIVE
pH, UA: 6

## 2016-11-27 LAB — COMPREHENSIVE METABOLIC PANEL
ALT: 29 U/L (ref 6–29)
AST: 28 U/L (ref 10–35)
Albumin: 4.6 g/dL (ref 3.6–5.1)
Alkaline Phosphatase: 97 U/L (ref 33–130)
BUN: 22 mg/dL (ref 7–25)
CO2: 25 mmol/L (ref 20–31)
Calcium: 10.4 mg/dL (ref 8.6–10.4)
Chloride: 103 mmol/L (ref 98–110)
Creat: 0.74 mg/dL (ref 0.60–0.93)
Glucose, Bld: 130 mg/dL — ABNORMAL HIGH (ref 65–99)
Potassium: 4.1 mmol/L (ref 3.5–5.3)
Sodium: 139 mmol/L (ref 135–146)
Total Bilirubin: 0.5 mg/dL (ref 0.2–1.2)
Total Protein: 7.8 g/dL (ref 6.1–8.1)

## 2016-11-27 LAB — LIPID PANEL
Cholesterol: 159 mg/dL (ref <200)
HDL: 61 mg/dL (ref >50)
LDL Cholesterol: 74 mg/dL (ref <100)
Total CHOL/HDL Ratio: 2.6 Ratio (ref <5.0)
Triglycerides: 122 mg/dL (ref <150)
VLDL: 24 mg/dL (ref <30)

## 2016-11-27 MED ORDER — METFORMIN HCL 1000 MG PO TABS: 1000.0000 mg | ORAL_TABLET | Freq: Two times a day (BID) | ORAL | Status: DC

## 2016-11-27 NOTE — Progress Notes (Signed)
Subjective:  Robyn Hill is a 71 y.o. female who complains of ongoing urinary tract infection.   She notes back in mid December started having urinary spasms, but days later foul odor in urine.  Just recently less foul odor.  Been drinking cranberry juice, lots of water.   Still has urgency to go, sometime incontinence, but small amounts at a time.   Had UTI in the past on Invokana, but none since until December  No blood in urine.   No rash, no vaginal discharge.    Diabetes -  Sugars have been up and down of late.  Sometimes in the 90s, sometimes high 100s.   Using metformin 1000mg , 0.5mg  BID, bydureon weekly, and 14 units Toujeo QHS.     Compliant with rest of medication.  Gets down and frustrated over her state of health, the recent urinary issues.  No other aggravating or relieving factors.  No other c/o.  Past Medical History:  Diagnosis Date  . Adrenal mass, right ([place])   . Allergy   . Anemia   . Asthma    "as a teenager"  . Blind right eye    since childhood  . Blood transfusion 1970  . Chronic back pain   . Chronic diarrhea    Colestipol therapy  . Depression   . Diverticulitis   . Diverticulosis   . Fatty liver   . Female bladder prolapse   . GERD (gastroesophageal reflux disease)   . Glaucoma   . H/O bone density study 11/29/2014   normal study although mild decreased in density from prior study  . H/O hiatal hernia   . Hemorrhoids   . History of kidney stones   . History of uterine cancer    s/p hysterectomy  . Hyperlipidemia   . Hypertension   . Migraine    hx of migraines in past   . Neuromuscular disorder ([place])    diabetic neuropahthy  . Numbness and tingling of both legs   . Osteoarthritis   . Rheumatic fever    age 66  . Type II diabetes mellitus ([place])   . Urinary incontinence   . Vitamin D deficiency    Current Outpatient Prescriptions on File Prior to Visit  Medication Sig Dispense Refill  . ACCU-CHEK SOFTCLIX LANCETS lancets TEST DAILY 100 each  5  . aspirin EC 81 MG tablet Take 1 tablet (81 mg total) by mouth daily. 90 tablet 3  . atorvastatin (LIPITOR) 40 MG tablet Take 1 tablet (40 mg total) by mouth daily. Taking at bedtime (Patient taking differently: Take 40 mg by mouth at bedtime. ) 90 tablet 3  . B-D UF III MINI PEN NEEDLES 31G X 5 MM MISC USE TO INJECT TOUJEO 100 each 2  . Blood Glucose Calibration (ACCU-CHEK GUIDE CONTROL) LIQD 1 Bottle by In Vitro route as directed. 1 each 3  . Blood Glucose Calibration (ACCU-CHEK INSTANT CONTROL) LIQD Use for controls for accu-chek activa meter 1 each 0  . DEXILANT 60 MG capsule TAKE 1 CAPSULE BY MOUTH TWICE DAILY 60 capsule 0  . dorzolamide-timolol (COSOPT) 22.3-6.8 MG/ML ophthalmic solution Place 1 drop into both eyes 2 (two) times daily.    . Exenatide ER (BYDUREON BCISE) 2 MG/0.85ML AUIJ Inject 2 mg into the skin once a week. 4 pen 2  . hydrochlorothiazide (HYDRODIURIL) 25 MG tablet Take 1 tablet (25 mg total) by mouth daily. 90 tablet 3  . HYDROcodone Bitartrate ER 30 MG T24A Take 1 tablet by  mouth daily.    . Insulin Glargine (TOUJEO SOLOSTAR) 300 UNIT/ML SOPN Inject 10 Units into the skin at bedtime. (Patient taking differently: Inject 12 Units into the skin at bedtime. ) 3 mL 11  . Insulin Pen Needle (PEN NEEDLES) 31G X 6 MM MISC Use these for Toujeo pen. 200 each 11  . Lancets (ACCU-CHEK SOFT TOUCH) lancets Test 2-3 times a day 100 each 5  . latanoprost (XALATAN) 0.005 % ophthalmic solution Place 1 drop into the right eye at bedtime.    Marland Kitchen linaclotide (LINZESS) 145 MCG CAPS capsule Take 1 capsule (145 mcg total) by mouth daily before breakfast. 90 capsule 1  . mupirocin ointment (BACTROBAN) 2 % Apply 1 application topically 2 (two) times daily as needed. Reported on 04/24/2016    . topiramate (TOPAMAX) 25 MG tablet Take 25 mg by mouth daily. 75qam and 75qpm    . triamcinolone cream (KENALOG) 0.1 % Apply 1 application topically as needed (for skin irritation). Reported on 04/24/2016    .  Vitamin D, Ergocalciferol, (DRISDOL) 50000 units CAPS capsule Take 1 capsule (50,000 Units total) by mouth every 7 (seven) days. 12 capsule 1  . amoxicillin-clavulanate (AUGMENTIN) 875-125 MG tablet Take 1 tablet by mouth 2 (two) times daily. (Patient not taking: Reported on 11/27/2016) 20 tablet 0  . ferrous gluconate (FERGON) 324 MG tablet Take 324 mg by mouth daily with breakfast.    . glucose blood (ACCU-CHEK AVIVA PLUS) test strip Test BID. Dx Code- E11.8 300 each 1   No current facility-administered medications on file prior to visit.     ROS as in subjective  Reviewed allergies, medications, past medical, surgical, and social history.    Objective: BP 124/76   Pulse 77   Wt 200 lb 6.4 oz (90.9 kg)   SpO2 99%   BMI 33.35 kg/m   General appearance: alert, no distress, WD/WN, female Abdomen: +bs, soft, non tender, non distended, no masses, no hepatomegaly, no splenomegaly, no bruits Back: no CVA tenderness GU: declined/deferred Lungs clear Heart rrr, normal s1 s2 , no murmur      Assessment: Encounter Diagnoses  Name Primary?  . Essential hypertension, benign Yes  . Diabetes mellitus with complication ([place])   . Anemia of chronic disease   . Hyperlipidemia, unspecified hyperlipidemia type   . Leukocytosis, unspecified type   . Urinary tract infection without hematuria, site unspecified      Plan: Labs today C/t same medications.  UTI - discussed her recent culture with many resistances.   Repeat UA and culture and hope for the best.  May end up needed parenteral IV antibiotics. C/t hydration, rest Routine labs today   Chistina was seen today for dm check.  Diagnoses and all orders for this visit:  Essential hypertension, benign -     Comprehensive metabolic panel  Diabetes mellitus with complication ([facility]) -     Comprehensive metabolic panel -     Hemoglobin A1c  Anemia of chronic disease -     Comprehensive metabolic panel -     CBC with  Differential/Platelet  Hyperlipidemia, unspecified hyperlipidemia type -     Lipid panel -     Comprehensive metabolic panel  Leukocytosis, unspecified type -     CBC with Differential/Platelet  Urinary tract infection without hematuria, site unspecified -     Urine culture -     Urinalysis Dipstick

## 2016-11-28 LAB — HEMOGLOBIN A1C
Hgb A1c MFr Bld: 7.1 % — ABNORMAL HIGH (ref <5.7)
Mean Plasma Glucose: 157 mg/dL

## 2016-11-29 LAB — URINE CULTURE

## 2016-11-30 ENCOUNTER — Encounter: Payer: Self-pay | Admitting: Medical

## 2016-11-30 ENCOUNTER — Other Ambulatory Visit: Payer: Self-pay

## 2016-11-30 ENCOUNTER — Other Ambulatory Visit: Payer: Self-pay | Admitting: Medical

## 2016-11-30 ENCOUNTER — Telehealth: Payer: Self-pay

## 2016-11-30 DIAGNOSIS — N390 Urinary tract infection, site not specified: Secondary | ICD-10-CM

## 2016-11-30 MED ORDER — FOSFOMYCIN TROMETHAMINE 3 G PO PACK: 3.0000 g | PACK | Freq: Once | ORAL | 0 refills | Status: AC

## 2016-11-30 NOTE — Telephone Encounter (Signed)
Pls check on this.  There is NO other option.   The urine culture shows RESISTANCE to all other oral options including bactrim/septra.   If this is not covered then the next step is going to Imipenem or Piperacillin which will REQUIRE IV access, home health daily visit to the house to do IV antibiotics!!!     So let pharmacist know to see if we can go with the requested antibiotic first.

## 2016-11-30 NOTE — Telephone Encounter (Signed)
Monurol granules not covered per [facility] via fax- request script for sulfa/trimeth per [employer].

## 2016-12-01 ENCOUNTER — Telehealth: Payer: Self-pay | Admitting: Medical

## 2016-12-01 ENCOUNTER — Other Ambulatory Visit: Payer: Self-pay

## 2016-12-01 MED ORDER — METFORMIN HCL 1000 MG PO TABS: 1000.0000 mg | ORAL_TABLET | Freq: Two times a day (BID) | ORAL | Status: DC

## 2016-12-01 NOTE — Telephone Encounter (Signed)
Called [facility] [phone] ID# [id number] and spoke with pharmacist and was able to do urgent review over the phone & get Monurol approved effective immediately.  Called pharmacy and went thru $3.70.  Called pt & informed

## 2016-12-03 ENCOUNTER — Other Ambulatory Visit: Payer: Self-pay

## 2016-12-03 DIAGNOSIS — N3020 Other chronic cystitis without hematuria: Secondary | ICD-10-CM | POA: Diagnosis not present

## 2016-12-03 MED ORDER — METFORMIN HCL 1000 MG PO TABS: 1000.0000 mg | ORAL_TABLET | Freq: Two times a day (BID) | ORAL | 1 refills | Status: DC

## 2016-12-05 ENCOUNTER — Telehealth: Payer: Self-pay | Admitting: Medical

## 2016-12-05 NOTE — Telephone Encounter (Signed)
P.A. DEXILANT 

## 2016-12-05 NOTE — Telephone Encounter (Signed)
P.A. BYDUREON BCISE

## 2016-12-05 NOTE — Telephone Encounter (Signed)
Recv'd faxed approval for Monurol approved til 10/18/17

## 2016-12-07 ENCOUNTER — Telehealth: Payer: Self-pay | Admitting: Medical

## 2016-12-07 NOTE — Telephone Encounter (Signed)
P.A. Done & approved  

## 2016-12-07 NOTE — Telephone Encounter (Signed)
Pt needs refill Dexilant to [place]

## 2016-12-09 ENCOUNTER — Other Ambulatory Visit: Payer: Self-pay | Admitting: Medical

## 2016-12-09 MED ORDER — DEXLANSOPRAZOLE 60 MG PO CPDR: 1.0000 | DELAYED_RELEASE_CAPSULE | Freq: Two times a day (BID) | ORAL | 1 refills | Status: DC

## 2016-12-15 NOTE — Telephone Encounter (Signed)
P.A. Approved til 10/18/17, pt informed °

## 2016-12-15 NOTE — Telephone Encounter (Signed)
Recv'd notice back that P.A. Not required for Bydureon, called pharmacy & went thru for $3.70.  Pt informed

## 2016-12-21 DIAGNOSIS — H401133 Primary open-angle glaucoma, bilateral, severe stage: Secondary | ICD-10-CM | POA: Diagnosis not present

## 2016-12-22 ENCOUNTER — Other Ambulatory Visit: Payer: Self-pay

## 2016-12-22 ENCOUNTER — Telehealth: Payer: Self-pay | Admitting: Medical

## 2016-12-22 MED ORDER — ATORVASTATIN CALCIUM 40 MG PO TABS: 40.0000 mg | ORAL_TABLET | Freq: Every day | ORAL | 3 refills | Status: DC

## 2016-12-22 MED ORDER — EXENATIDE ER 2 MG/0.85ML ~~LOC~~ AUIJ: 2.0000 mg | AUTO-INJECTOR | SUBCUTANEOUS | 2 refills | Status: DC

## 2016-12-22 MED ORDER — HYDROCHLOROTHIAZIDE 25 MG PO TABS: 25.0000 mg | ORAL_TABLET | Freq: Every day | ORAL | 3 refills | Status: DC

## 2016-12-22 NOTE — Telephone Encounter (Signed)
Pt states all her meds were supposed to be already switched to [facility] in [place].  Please send in refills for Bydureon, Atorvastatin, HCTZ, Celebrex all to [facility]

## 2016-12-22 NOTE — Telephone Encounter (Signed)
Sent in refills to [facility] in [place] to [address].  Called pt to let her know that Celebrex  Wasn't on her med list, she said the that dr.hankins was writing  This for her . I told her she needs to call their office's to as for the refill

## 2017-01-14 DIAGNOSIS — N3020 Other chronic cystitis without hematuria: Secondary | ICD-10-CM | POA: Diagnosis not present

## 2017-01-14 DIAGNOSIS — N3000 Acute cystitis without hematuria: Secondary | ICD-10-CM | POA: Diagnosis not present

## 2017-01-19 ENCOUNTER — Telehealth: Payer: Self-pay | Admitting: Medical

## 2017-01-19 NOTE — Telephone Encounter (Signed)
Pt called said Toujeo had not been switched with her other meds to [facility], this was switched.

## 2017-01-25 DIAGNOSIS — H401123 Primary open-angle glaucoma, left eye, severe stage: Secondary | ICD-10-CM | POA: Diagnosis not present

## 2017-01-27 DIAGNOSIS — M25511 Pain in right shoulder: Secondary | ICD-10-CM | POA: Diagnosis not present

## 2017-01-27 DIAGNOSIS — L905 Scar conditions and fibrosis of skin: Secondary | ICD-10-CM | POA: Diagnosis not present

## 2017-01-27 DIAGNOSIS — M961 Postlaminectomy syndrome, not elsewhere classified: Secondary | ICD-10-CM | POA: Diagnosis not present

## 2017-02-08 ENCOUNTER — Other Ambulatory Visit: Payer: Self-pay

## 2017-02-08 MED ORDER — DULOXETINE HCL 60 MG PO CPEP: 60.0000 mg | ORAL_CAPSULE | Freq: Every day | ORAL | 0 refills | Status: DC

## 2017-02-08 MED ORDER — VITAMIN D (ERGOCALCIFEROL) 1.25 MG (50000 UNIT) PO CAPS: 50000.0000 [IU] | ORAL_CAPSULE | ORAL | 1 refills | Status: DC

## 2017-02-08 NOTE — Telephone Encounter (Signed)
Pt called requesting a refill on her Cymbalta , but I don't see were you have given her this in while.can she have a refill.

## 2017-02-13 ENCOUNTER — Encounter: Payer: Self-pay | Admitting: Medical

## 2017-02-17 ENCOUNTER — Encounter: Payer: Self-pay | Admitting: *Deleted

## 2017-02-18 ENCOUNTER — Other Ambulatory Visit: Payer: Self-pay

## 2017-02-18 MED ORDER — ENALAPRIL MALEATE 20 MG PO TABS: ORAL_TABLET | ORAL | 0 refills | Status: DC

## 2017-02-18 MED ORDER — DULOXETINE HCL 60 MG PO CPEP: 60.0000 mg | ORAL_CAPSULE | Freq: Every day | ORAL | 0 refills | Status: DC

## 2017-02-19 ENCOUNTER — Encounter: Payer: Self-pay | Admitting: Medical

## 2017-02-22 ENCOUNTER — Other Ambulatory Visit: Payer: Self-pay | Admitting: Medical

## 2017-02-22 MED ORDER — ROPINIROLE HCL 0.5 MG PO TABS: ORAL_TABLET | ORAL | 3 refills | Status: DC

## 2017-02-26 DIAGNOSIS — N8111 Cystocele, midline: Secondary | ICD-10-CM | POA: Diagnosis not present

## 2017-02-26 DIAGNOSIS — B961 Klebsiella pneumoniae [K. pneumoniae] as the cause of diseases classified elsewhere: Secondary | ICD-10-CM | POA: Diagnosis not present

## 2017-02-26 DIAGNOSIS — N390 Urinary tract infection, site not specified: Secondary | ICD-10-CM | POA: Diagnosis not present

## 2017-02-26 DIAGNOSIS — N3020 Other chronic cystitis without hematuria: Secondary | ICD-10-CM | POA: Diagnosis not present

## 2017-03-01 ENCOUNTER — Other Ambulatory Visit: Payer: Self-pay | Admitting: Medical

## 2017-03-01 DIAGNOSIS — H401123 Primary open-angle glaucoma, left eye, severe stage: Secondary | ICD-10-CM | POA: Diagnosis not present

## 2017-03-07 NOTE — Progress Notes (Signed)
Cardiology Office Note   Date:  03/08/2017   ID:  BERDIE Hill, DOB May 09, 1946, MRN [id number]  PCP:  Carlena Hurl, PA-C  Cardiologist:   Minus Breeding, MD   Chief Complaint  Patient presents with  . Fatigue      History of Present Illness: Robyn Hill is a 71 y.o. female who presents for evaluation of fatigue and diaphoresis.  I saw her in August of last year.  She had a negative Lexiscan Myoview.  Since I last saw her she did have a sleep study and this was negative.  She has had no acute problems with her heart.  She has had recurrent UTIs and she had N/V and abdominal pain with antibiotics.  She lost 13 lbs and just yesterday took herself off of nitrofurantoin.     Past Medical History:  Diagnosis Date  . Adrenal mass, right ([place])   . Allergy   . Anemia   . Asthma    "as a teenager"  . Blind right eye    since childhood  . Blood transfusion 1970  . Chronic back pain   . Chronic diarrhea    Colestipol therapy  . Depression   . Diverticulitis   . Diverticulosis   . Fatty liver   . Female bladder prolapse   . GERD (gastroesophageal reflux disease)   . Glaucoma   . H/O bone density study 11/29/2014   normal study although mild decreased in density from prior study  . H/O hiatal hernia   . Hemorrhoids   . History of kidney stones   . History of uterine cancer    s/p hysterectomy  . Hyperlipidemia   . Hypertension   . Migraine    hx of migraines in past   . Neuromuscular disorder ([place])    diabetic neuropahthy  . Numbness and tingling of both legs   . Osteoarthritis   . Rheumatic fever    age 47  . Type II diabetes mellitus ([place])   . Urinary incontinence   . Vitamin D deficiency     Past Surgical History:  Procedure Laterality Date  . ABDOMINAL HYSTERECTOMY  1980   no further pap smears needed  . ANTERIOR AND POSTERIOR REPAIR N/A 01/29/2015   Procedure: CYSTOCELE AND RECTOCELE ;  Surgeon: Bjorn Loser, MD;  Location: [facility];  Service:  Urology;  Laterality: N/A;  . BACK SURGERY    . BREAST BIOPSY  08/1997   right  . CARDIAC CATHETERIZATION  11/2006  . CHOLECYSTECTOMY    . COLONOSCOPY  2014   polyps, external hemorrhoids  . CYSTOSCOPY N/A 01/29/2015   Procedure: CYSTOSCOPY;  Surgeon: Bjorn Loser, MD;  Location: [facility];  Service: Urology;  Laterality: N/A;  . DILATION AND CURETTAGE OF UTERUS  1980  . EDSI for back pain  last 01/2009   multiple x 5  . EYE SURGERY     Bil Blind R eye  . [place]  . LIVER BIOPSY  04/1999  . minor laceration repair     right eye  . POSTERIOR FUSION LUMBAR SPINE  04/05/12  . PULSE GENERATOR IMPLANT Right 03/12/2016   Procedure: Right Reposition of implantable pulse generator;  Surgeon: Erline Levine, MD;  Location: [place] [facility];  Service: Neurosurgery;  Laterality: Right;  Right Reposition of implantable pulse generator  . SPINAL CORD STIMULATOR INSERTION N/A 09/06/2015   Procedure: LUMBAR SPINAL CORD STIMULATOR INSERTION;  Surgeon: Clydell Hakim, MD;  Location:  [place] [facility];  Service: Neurosurgery;  Laterality: N/A;  Spinal Cord Stimulator placement  . SPINAL CORD STIMULATOR INSERTION N/A 10/22/2015   Procedure: Laminectomy for spinal cord stimulator paddle lead and implantable generator placement;  Surgeon: Erline Levine, MD;  Location: [place] [facility];  Service: Neurosurgery;  Laterality: N/A;  Laminectomy for spinal cord stimulator paddle lead and implantable generator placement  . steroid injections of hip  last 02/2009   multiple x 4  . TONSILLECTOMY AND ADENOIDECTOMY  1952  . TRABECULECTOMY  1996   bilaterally; "for glaucoma"  . UPPER GI ENDOSCOPY  05/21/14   food, retained in the body of the stomach  . VAGINAL PROLAPSE REPAIR N/A 01/29/2015   Procedure:  VAULT PROLAPSE WITH GRAFT ;  Surgeon: Bjorn Loser, MD;  Location: [facility];  Service: Urology;  Laterality: N/A;     Current Outpatient Prescriptions  Medication Sig Dispense Refill  . ACCU-CHEK SOFTCLIX LANCETS  lancets TEST DAILY 100 each 5  . aspirin EC 81 MG tablet Take 1 tablet (81 mg total) by mouth daily. 90 tablet 3  . atorvastatin (LIPITOR) 40 MG tablet Take 1 tablet (40 mg total) by mouth daily. Taking at bedtime 90 tablet 3  . B-D UF III MINI PEN NEEDLES 31G X 5 MM MISC USE TO INJECT TOUJEO 100 each 3  . Blood Glucose Calibration (ACCU-CHEK INSTANT CONTROL) LIQD Use for controls for accu-chek activa meter 1 each 0  . dexlansoprazole (DEXILANT) 60 MG capsule Take 1 capsule (60 mg total) by mouth 2 (two) times daily. 180 capsule 1  . dorzolamide-timolol (COSOPT) 22.3-6.8 MG/ML ophthalmic solution Place 1 drop into both eyes 2 (two) times daily.    . DULoxetine (CYMBALTA) 60 MG capsule Take 1 capsule (60 mg total) by mouth daily. 90 capsule 0  . enalapril (VASOTEC) 20 MG tablet TAKE 1 TABLET(20 MG) BY MOUTH DAILY 90 tablet 0  . Exenatide ER (BYDUREON BCISE) 2 MG/0.85ML AUIJ Inject 2 mg into the skin once a week. 4 pen 2  . ferrous gluconate (FERGON) 324 MG tablet Take 324 mg by mouth daily with breakfast.    . glucose blood (ACCU-CHEK AVIVA PLUS) test strip Test BID. Dx Code- E11.8 300 each 1  . hydrochlorothiazide (HYDRODIURIL) 25 MG tablet Take 1 tablet (25 mg total) by mouth daily. 90 tablet 3  . HYDROcodone Bitartrate ER 30 MG T24A Take 1 tablet by mouth daily.    . Insulin Glargine (TOUJEO SOLOSTAR) 300 UNIT/ML SOPN Inject 10 Units into the skin at bedtime. (Patient taking differently: Inject 12 Units into the skin at bedtime. ) 3 mL 11  . Insulin Pen Needle (PEN NEEDLES) 31G X 6 MM MISC Use these for Toujeo pen. 200 each 11  . Lancets (ACCU-CHEK SOFT TOUCH) lancets Test 2-3 times a day 100 each 5  . latanoprost (XALATAN) 0.005 % ophthalmic solution Place 1 drop into the right eye at bedtime.    Marland Kitchen linaclotide (LINZESS) 145 MCG CAPS capsule Take 1 capsule (145 mcg total) by mouth daily before breakfast. 90 capsule 1  . metFORMIN (GLUCOPHAGE) 1000 MG tablet Take 1 tablet (1,000 mg total) by  mouth 2 (two) times daily with a meal. 180 tablet 1  . mupirocin ointment (BACTROBAN) 2 % Apply 1 application topically 2 (two) times daily as needed. Reported on 04/24/2016    . rOPINIRole (REQUIP) 0.5 MG tablet TAKE 1 TABLET BY MOUTH AT BEDTIME FOR LIMB MOVEMENT DISORDER 90 tablet 3  . topiramate (TOPAMAX) 25 MG tablet  Take 25 mg by mouth daily. 75qam and 75qpm    . triamcinolone cream (KENALOG) 0.1 % Apply 1 application topically as needed (for skin irritation). Reported on 04/24/2016    . Vitamin D, Ergocalciferol, (DRISDOL) 50000 units CAPS capsule Take 1 capsule (50,000 Units total) by mouth every 7 (seven) days. 12 capsule 1   No current facility-administered medications for this visit.     Allergies:   Sunflowerseed oil; Kiwi extract; Tape; Gabapentin; Hydrocodone-acetaminophen; Lyrica [pregabalin]; Nitrofuran derivatives; and Invokana [canagliflozin]     ROS:  Please see the history of present illness.   Otherwise, review of systems are positive for none.   All other systems are reviewed and negative.    PHYSICAL EXAM: VS:  BP 100/66 (BP Location: Left Arm, Patient Position: Sitting, Cuff Size: Large)   Pulse 76   Ht 5\' 5"  (1.651 m)   Wt 187 lb (84.8 kg)   BMI 31.12 kg/m  , BMI Body mass index is 31.12 kg/m.  GENERAL:  Well appearing NECK:  No jugular venous distention, waveform within normal limits, carotid upstroke brisk and symmetric, no bruits, no thyromegaly LUNGS:  Clear to auscultation bilaterally BACK:  No CVA tenderness CHEST:  Unremarkable HEART:  PMI not displaced or sustained,S1 and S2 within normal limits, no S3, no S4, no clicks, no rubs, no murmurs ABD:  Flat, positive bowel sounds normal in frequency in pitch, no bruits, no rebound, no guarding, no midline pulsatile mass, no hepatomegaly, no splenomegaly EXT:  2 plus pulses throughout, no edema, no cyanosis no clubbing   EKG:  EKG is  ordered today. The ekg ordered today demonstrates normal sinus rhythm, rate  76, leftward axis, poor anterior R wave progression, intervals within normal limits, no acute ST-T wave changes.   Recent Labs: 08/26/2016: TSH 0.58 11/27/2016: ALT 29; BUN 22; Creat 0.74; Hemoglobin 13.9; Platelets 377; Potassium 4.1; Sodium 139    Lipid Panel    Component Value Date/Time   CHOL 159 11/27/2016 1035   TRIG 122 11/27/2016 1035   HDL 61 11/27/2016 1035   CHOLHDL 2.6 11/27/2016 1035   VLDL 24 11/27/2016 1035   LDLCALC 74 11/27/2016 1035     Lab Results  Component Value Date   HGBA1C 7.1 (H) 11/27/2016     Wt Readings from Last 3 Encounters:  03/08/17 187 lb (84.8 kg)  11/27/16 200 lb 6.4 oz (90.9 kg)  11/05/16 203 lb 3.2 oz (92.2 kg)      Other studies Reviewed: Additional studies/ records that were reviewed today include: None. Review of the above records demonstrates:     ASSESSMENT AND PLAN:   FATIGUE:  This is improved.  No change in therapy indicated.  No further work up is planned.    SOB:   This is baseline and likely related to pain and deconditioning.  No further work up is indicated.   DM:   A1C is at target.  This will be managed per Carlena Hurl, PA-C   MVP:  She was told that she had this in the distant past.  However, her exam is OK.  No further imaging is planned.    Current medicines are reviewed at length with the patient today.  The patient does not have concerns regarding medicines.  The following changes have been made:  no change  Labs/ tests ordered today include:  None  No orders of the defined types were placed in this encounter.    Disposition:   FU with me  as needed.     Signed, Minus Breeding, MD  03/08/2017 12:57 PM    [place]

## 2017-03-08 ENCOUNTER — Encounter: Payer: Self-pay | Admitting: Cardiology

## 2017-03-08 ENCOUNTER — Ambulatory Visit (INDEPENDENT_AMBULATORY_CARE_PROVIDER_SITE_OTHER): Payer: Medicare Other | Admitting: Cardiology

## 2017-03-08 VITALS — BP 100/66 | HR 76 | Ht 65.0 in | Wt 187.0 lb

## 2017-03-08 DIAGNOSIS — R5382 Chronic fatigue, unspecified: Secondary | ICD-10-CM | POA: Diagnosis not present

## 2017-03-08 DIAGNOSIS — R0600 Dyspnea, unspecified: Secondary | ICD-10-CM | POA: Diagnosis not present

## 2017-03-08 NOTE — Patient Instructions (Signed)
Medication Instructions:  Continue current medications  Labwork: None Ordered  Testing/Procedures: None Ordered  Follow-Up: Your physician recommends that you schedule a follow-up appointment in: As Needed   Any Other Special Instructions Will Be Listed Below (If Applicable).   If you need a refill on your cardiac medications before your next appointment, please call your pharmacy.   

## 2017-03-10 NOTE — Addendum Note (Signed)
Addended by: Waylan Rocher on: 03/10/2017 12:02 PM   Modules accepted: Orders

## 2017-03-12 ENCOUNTER — Telehealth: Payer: Self-pay | Admitting: Internal Medicine

## 2017-03-12 MED ORDER — ENALAPRIL MALEATE 20 MG PO TABS: ORAL_TABLET | ORAL | 1 refills | Status: DC

## 2017-03-12 NOTE — Telephone Encounter (Signed)
Pt called and wanted enalpril sent in to [facility] [address] [facility]  I have resent it in

## 2017-03-15 ENCOUNTER — Other Ambulatory Visit: Payer: Self-pay | Admitting: Medical

## 2017-03-17 ENCOUNTER — Other Ambulatory Visit: Payer: Self-pay

## 2017-03-17 ENCOUNTER — Telehealth: Payer: Self-pay

## 2017-03-17 MED ORDER — EXENATIDE ER 2 MG ~~LOC~~ PEN: PEN_INJECTOR | SUBCUTANEOUS | 2 refills | Status: DC

## 2017-03-17 NOTE — Telephone Encounter (Signed)
[place] requesting wants script sent to [facility] called in place of the By[facility]ure[facility]. Robyn Hill December

## 2017-03-17 NOTE — Telephone Encounter (Signed)
Sent in refill and made an med check appt,

## 2017-03-30 ENCOUNTER — Encounter: Payer: Self-pay | Admitting: Medical

## 2017-03-30 ENCOUNTER — Ambulatory Visit (INDEPENDENT_AMBULATORY_CARE_PROVIDER_SITE_OTHER): Payer: Medicare Other | Admitting: Medical

## 2017-03-30 VITALS — BP 126/60 | HR 81 | Resp 18 | Wt 185.6 lb

## 2017-03-30 DIAGNOSIS — I10 Essential (primary) hypertension: Secondary | ICD-10-CM

## 2017-03-30 DIAGNOSIS — D638 Anemia in other chronic diseases classified elsewhere: Secondary | ICD-10-CM | POA: Diagnosis not present

## 2017-03-30 DIAGNOSIS — R258 Other abnormal involuntary movements: Secondary | ICD-10-CM

## 2017-03-30 DIAGNOSIS — G4761 Periodic limb movement disorder: Secondary | ICD-10-CM | POA: Diagnosis not present

## 2017-03-30 DIAGNOSIS — E118 Type 2 diabetes mellitus with unspecified complications: Secondary | ICD-10-CM

## 2017-03-30 DIAGNOSIS — G8929 Other chronic pain: Secondary | ICD-10-CM | POA: Diagnosis not present

## 2017-03-30 DIAGNOSIS — E559 Vitamin D deficiency, unspecified: Secondary | ICD-10-CM

## 2017-03-30 DIAGNOSIS — E785 Hyperlipidemia, unspecified: Secondary | ICD-10-CM

## 2017-03-30 DIAGNOSIS — D500 Iron deficiency anemia secondary to blood loss (chronic): Secondary | ICD-10-CM

## 2017-03-30 DIAGNOSIS — M898X9 Other specified disorders of bone, unspecified site: Secondary | ICD-10-CM | POA: Diagnosis not present

## 2017-03-30 DIAGNOSIS — M961 Postlaminectomy syndrome, not elsewhere classified: Secondary | ICD-10-CM

## 2017-03-30 DIAGNOSIS — M1990 Unspecified osteoarthritis, unspecified site: Secondary | ICD-10-CM

## 2017-03-30 DIAGNOSIS — G609 Hereditary and idiopathic neuropathy, unspecified: Secondary | ICD-10-CM | POA: Diagnosis not present

## 2017-03-30 LAB — COMPREHENSIVE METABOLIC PANEL
ALT: 15 U/L (ref 6–29)
AST: 14 U/L (ref 10–35)
Albumin: 4.3 g/dL (ref 3.6–5.1)
Alkaline Phosphatase: 78 U/L (ref 33–130)
BUN: 21 mg/dL (ref 7–25)
CO2: 24 mmol/L (ref 20–31)
Calcium: 9.9 mg/dL (ref 8.6–10.4)
Chloride: 102 mmol/L (ref 98–110)
Creat: 0.89 mg/dL (ref 0.60–0.93)
Glucose, Bld: 84 mg/dL (ref 65–99)
Potassium: 5.1 mmol/L (ref 3.5–5.3)
Sodium: 138 mmol/L (ref 135–146)
Total Bilirubin: 0.5 mg/dL (ref 0.2–1.2)
Total Protein: 7.2 g/dL (ref 6.1–8.1)

## 2017-03-30 LAB — CBC
HCT: 39.8 % (ref 35.0–45.0)
Hemoglobin: 12.9 g/dL (ref 11.7–15.5)
MCH: 27.7 pg (ref 27.0–33.0)
MCHC: 32.4 g/dL (ref 32.0–36.0)
MCV: 85.6 fL (ref 80.0–100.0)
MPV: 10.6 fL (ref 7.5–12.5)
Platelets: 325 10*3/uL (ref 140–400)
RBC: 4.65 MIL/uL (ref 3.80–5.10)
RDW: 13.2 % (ref 11.0–15.0)
WBC: 9.7 10*3/uL (ref 4.0–10.5)

## 2017-03-30 LAB — POCT GLYCOSYLATED HEMOGLOBIN (HGB A1C): Hemoglobin A1C: 6.2

## 2017-03-30 LAB — IRON: Iron: 84 ug/dL (ref 45–160)

## 2017-03-30 NOTE — Progress Notes (Signed)
Subjective: Chief Complaint  Patient presents with  . Diabetes    med check- fasting, pt c/o back pain.    Here for med check.  Accompanied by husband.    Her main c/o is ongoing burning pain in her right low back since prior stimulator surgery.  She is frustrated that neurosurgery doesn't seem interested in imaging or doing other tests to fine her some resolution to the pain.  The pain is not controlled on current medications.  In addition to that pain which causes her to not be able to lie on left side, she also notes rotator cuff issues on the right as well as bone pain in general all over, ongoing for months.   Diabetes - she is checking sugars, and they look good.  Eating healthy but not exercising given her pains.  She was recently put on Nitrofurantoin per urology for recurrent UTI but had reaction to this.  Is otherwise compliant with medications.  No other aggravating or relieving factors. No other complaint.   Past Medical History:  Diagnosis Date  . Adrenal mass, right ([place])   . Allergy   . Anemia   . Asthma    "as a teenager"  . Blind right eye    since childhood  . Blood transfusion 1970  . Chronic back pain   . Chronic diarrhea    Colestipol therapy  . Depression   . Diverticulitis   . Diverticulosis   . Fatty liver   . Female bladder prolapse   . GERD (gastroesophageal reflux disease)   . Glaucoma   . H/O bone density study 11/29/2014   normal study although mild decreased in density from prior study  . H/O hiatal hernia   . Hemorrhoids   . History of kidney stones   . History of uterine cancer    s/p hysterectomy  . Hyperlipidemia   . Hypertension   . Migraine    hx of migraines in past   . Neuromuscular disorder ([place])    diabetic neuropahthy  . Numbness and tingling of both legs   . Osteoarthritis   . Rheumatic fever    age 15  . Type II diabetes mellitus ([place])   . Urinary incontinence   . Vitamin D deficiency    Current Outpatient  Prescriptions on File Prior to Visit  Medication Sig Dispense Refill  . ACCU-CHEK SOFTCLIX LANCETS lancets TEST DAILY 100 each 5  . atorvastatin (LIPITOR) 40 MG tablet Take 1 tablet (40 mg total) by mouth daily. Taking at bedtime 90 tablet 3  . dexlansoprazole (DEXILANT) 60 MG capsule Take 1 capsule (60 mg total) by mouth 2 (two) times daily. 180 capsule 1  . dorzolamide-timolol (COSOPT) 22.3-6.8 MG/ML ophthalmic solution Place 1 drop into both eyes 2 (two) times daily.    . DULoxetine (CYMBALTA) 60 MG capsule Take 1 capsule (60 mg total) by mouth daily. 90 capsule 0  . enalapril (VASOTEC) 20 MG tablet TAKE 1 TABLET(20 MG) BY MOUTH DAILY 90 tablet 1  . Exenatide ER (BYDUREON) 2 MG PEN INJECT CONTENTS OF ONE SYRINGE SUBCUTANEOUSLY ONCE A WEEK 4 each 2  . ferrous gluconate (FERGON) 324 MG tablet Take 324 mg by mouth daily with breakfast.    . hydrochlorothiazide (HYDRODIURIL) 25 MG tablet Take 1 tablet (25 mg total) by mouth daily. 90 tablet 3  . Insulin Glargine (TOUJEO SOLOSTAR) 300 UNIT/ML SOPN Inject 10 Units into the skin at bedtime. (Patient taking differently: Inject 12 Units into the skin at  bedtime. ) 3 mL 11  . latanoprost (XALATAN) 0.005 % ophthalmic solution Place 1 drop into the right eye at bedtime.    . metFORMIN (GLUCOPHAGE) 1000 MG tablet Take 1 tablet (1,000 mg total) by mouth 2 (two) times daily with a meal. 180 tablet 1  . rOPINIRole (REQUIP) 0.5 MG tablet TAKE 1 TABLET BY MOUTH AT BEDTIME FOR LIMB MOVEMENT DISORDER 90 tablet 3  . topiramate (TOPAMAX) 25 MG tablet Take 25 mg by mouth daily. 75qam and 75qpm    . aspirin EC 81 MG tablet Take 1 tablet (81 mg total) by mouth daily. (Patient not taking: Reported on 03/30/2017) 90 tablet 3  . B-D UF III MINI PEN NEEDLES 31G X 5 MM MISC USE TO INJECT TOUJEO 100 each 3  . Blood Glucose Calibration (ACCU-CHEK INSTANT CONTROL) LIQD Use for controls for accu-chek activa meter 1 each 0  . glucose blood (ACCU-CHEK AVIVA PLUS) test strip Test  BID. Dx Code- E11.8 300 each 1  . Insulin Pen Needle (PEN NEEDLES) 31G X 6 MM MISC Use these for Toujeo pen. 200 each 11  . Lancets (ACCU-CHEK SOFT TOUCH) lancets Test 2-3 times a day 100 each 5  . linaclotide (LINZESS) 145 MCG CAPS capsule Take 1 capsule (145 mcg total) by mouth daily before breakfast. (Patient not taking: Reported on 03/30/2017) 90 capsule 1  . mupirocin ointment (BACTROBAN) 2 % Apply 1 application topically 2 (two) times daily as needed. Reported on 04/24/2016    . triamcinolone cream (KENALOG) 0.1 % Apply 1 application topically as needed (for skin irritation). Reported on 04/24/2016    . Vitamin D, Ergocalciferol, (DRISDOL) 50000 units CAPS capsule Take 1 capsule (50,000 Units total) by mouth every 7 (seven) days. (Patient not taking: Reported on 03/30/2017) 12 capsule 1   No current facility-administered medications on file prior to visit.    Past Surgical History:  Procedure Laterality Date  . ABDOMINAL HYSTERECTOMY  1980   no further pap smears needed  . ANTERIOR AND POSTERIOR REPAIR N/A 01/29/2015   Procedure: CYSTOCELE AND RECTOCELE ;  Surgeon: Bjorn Loser, MD;  Location: [facility];  Service: Urology;  Laterality: N/A;  . BACK SURGERY    . BREAST BIOPSY  08/1997   right  . CARDIAC CATHETERIZATION  11/2006  . CHOLECYSTECTOMY    . COLONOSCOPY  2014   polyps, external hemorrhoids  . CYSTOSCOPY N/A 01/29/2015   Procedure: CYSTOSCOPY;  Surgeon: Bjorn Loser, MD;  Location: [facility];  Service: Urology;  Laterality: N/A;  . DILATION AND CURETTAGE OF UTERUS  1980  . EDSI for back pain  last 01/2009   multiple x 5  . EYE SURGERY     Bil Blind R eye  . [place]  . LIVER BIOPSY  04/1999  . minor laceration repair     right eye  . POSTERIOR FUSION LUMBAR SPINE  04/05/12  . PULSE GENERATOR IMPLANT Right 03/12/2016   Procedure: Right Reposition of implantable pulse generator;  Surgeon: Erline Levine, MD;  Location: [place] [facility];  Service: Neurosurgery;   Laterality: Right;  Right Reposition of implantable pulse generator  . SPINAL CORD STIMULATOR INSERTION N/A 09/06/2015   Procedure: LUMBAR SPINAL CORD STIMULATOR INSERTION;  Surgeon: Clydell Hakim, MD;  Location: [facility];  Service: Neurosurgery;  Laterality: N/A;  Spinal Cord Stimulator placement  . SPINAL CORD STIMULATOR INSERTION N/A 10/22/2015   Procedure: Laminectomy for spinal cord stimulator paddle lead and implantable generator placement;  Surgeon: Broadus John  Vertell Limber, MD;  Location: [facility];  Service: Neurosurgery;  Laterality: N/A;  Laminectomy for spinal cord stimulator paddle lead and implantable generator placement  . steroid injections of hip  last 02/2009   multiple x 4  . TONSILLECTOMY AND ADENOIDECTOMY  1952  . TRABECULECTOMY  1996   bilaterally; "for glaucoma"  . UPPER GI ENDOSCOPY  05/21/14   food, retained in the body of the stomach  . VAGINAL PROLAPSE REPAIR N/A 01/29/2015   Procedure:  VAULT PROLAPSE WITH GRAFT ;  Surgeon: Bjorn Loser, MD;  [facility];  Service: Urology;  Laterality: N/A;    ROS as in subjective  Objective: BP 126/60   Pulse 81   Resp 18   Wt 185 lb 9.6 oz (84.2 kg)   SpO2 96%   BMI 30.89 kg/m   General appearence: alert, no distress, WD/WN,  Oral cavity: MMM, no lesions Neck: supple, no lymphadenopathy, no thyromegaly, no masses Heart: RRR, normal S1, S2, no murmurs Lungs: CTA bilaterally, no wheezes, rhonchi, or rales Pulses: 2+ symmetric, upper and lower extremities, normal cap refill    Assessment: Encounter Diagnoses  Name Primary?  . Diabetes mellitus with complication ([place]) Yes  . Bone pain   . Arthritis   . Idiopathic peripheral neuropathy   . Essential hypertension, benign   . Vitamin D deficiency   . Periodic limb movement disorder   . Post laminectomy syndrome   . Nocturnal leg movements   . Iron deficiency anemia secondary to blood loss (chronic)   . Hyperlipidemia, unspecified hyperlipidemia type   . Other  chronic pain   . Anemia of chronic disease     Plan: C/t routine f/u with neurosurgery, but referral to orthopedics regarding right shoulder pain, bone pain in general, chronic pain and the right low back pain pain.   C/t same medications.  Labs today  counseling on vaccines.   Advised she check insurance coverage for Shingrix and uptated Tdap.  F/u pending labs. Raziah was seen today for diabetes.  Diagnoses and all orders for this visit:  Diabetes mellitus with complication ([place]) -     HgB A1c -     Comprehensive metabolic panel  Bone pain -     Sedimentation rate -     CK -     Ambulatory referral to Orthopedic Surgery  Arthritis -     Ambulatory referral to Orthopedic Surgery  Idiopathic peripheral neuropathy  Essential hypertension, benign  Vitamin D deficiency -     VITAMIN D 25 Hydroxy (Vit-D Deficiency, Fractures)  Periodic limb movement disorder -     Ambulatory referral to Orthopedic Surgery  Post laminectomy syndrome -     Ambulatory referral to Orthopedic Surgery  Nocturnal leg movements  Iron deficiency anemia secondary to blood loss (chronic)  Hyperlipidemia, unspecified hyperlipidemia type  Other chronic pain -     Ambulatory referral to Orthopedic Surgery  Anemia of chronic disease -     Iron -     CBC

## 2017-03-31 LAB — SEDIMENTATION RATE: Sed Rate: 6 mm/hr (ref 0–30)

## 2017-03-31 LAB — VITAMIN D 25 HYDROXY (VIT D DEFICIENCY, FRACTURES): Vit D, 25-Hydroxy: 44 ng/mL (ref 30–100)

## 2017-03-31 LAB — CK: Total CK: 23 U/L — ABNORMAL LOW (ref 29–143)

## 2017-04-09 ENCOUNTER — Telehealth: Payer: Self-pay | Admitting: Medical

## 2017-04-09 NOTE — Telephone Encounter (Signed)
Please call   She has not heard anything about ortho referral

## 2017-04-09 NOTE — Telephone Encounter (Signed)
Spoke to patient and told her it looks like it was sent to [facility] team and they should contact her with an appt. I gave her the number to call as well incase they haven't in the next few days

## 2017-04-12 ENCOUNTER — Telehealth: Payer: Self-pay

## 2017-04-12 ENCOUNTER — Other Ambulatory Visit: Payer: Self-pay

## 2017-04-12 MED ORDER — EXENATIDE ER 2 MG ~~LOC~~ PEN: PEN_INJECTOR | SUBCUTANEOUS | 2 refills | Status: DC

## 2017-04-12 NOTE — Telephone Encounter (Signed)
Pt needs refill of Bydureon sent to [facility]. [place] December

## 2017-04-12 NOTE — Telephone Encounter (Signed)
Done

## 2017-05-03 ENCOUNTER — Ambulatory Visit (INDEPENDENT_AMBULATORY_CARE_PROVIDER_SITE_OTHER): Payer: Medicare Other

## 2017-05-03 ENCOUNTER — Ambulatory Visit (INDEPENDENT_AMBULATORY_CARE_PROVIDER_SITE_OTHER): Payer: Medicare Other | Admitting: Orthopaedic Surgery

## 2017-05-03 ENCOUNTER — Encounter (INDEPENDENT_AMBULATORY_CARE_PROVIDER_SITE_OTHER): Payer: Self-pay | Admitting: Orthopaedic Surgery

## 2017-05-03 VITALS — BP 132/75 | HR 79 | Resp 14 | Ht 65.0 in | Wt 185.0 lb

## 2017-05-03 DIAGNOSIS — M25511 Pain in right shoulder: Secondary | ICD-10-CM

## 2017-05-03 DIAGNOSIS — M5442 Lumbago with sciatica, left side: Secondary | ICD-10-CM

## 2017-05-03 DIAGNOSIS — G8929 Other chronic pain: Secondary | ICD-10-CM | POA: Diagnosis not present

## 2017-05-03 DIAGNOSIS — M545 Low back pain: Secondary | ICD-10-CM | POA: Diagnosis not present

## 2017-05-03 MED ORDER — METHYLPREDNISOLONE ACETATE 40 MG/ML IJ SUSP
80.0000 mg | INTRAMUSCULAR | Status: AC | PRN
  Administered 2017-05-03: 80 mg

## 2017-05-03 MED ORDER — BUPIVACAINE HCL 0.5 % IJ SOLN
2.0000 mL | INTRAMUSCULAR | Status: AC | PRN
  Administered 2017-05-03: 2 mL via INTRA_ARTICULAR

## 2017-05-03 MED ORDER — LIDOCAINE HCL 1 % IJ SOLN
2.0000 mL | INTRAMUSCULAR | Status: AC | PRN
  Administered 2017-05-03: 2 mL

## 2017-05-03 NOTE — Addendum Note (Signed)
Addended by: Mervyn Skeeters on: 05/03/2017 10:38 AM   Modules accepted: Orders

## 2017-05-03 NOTE — Progress Notes (Signed)
Office Visit Note   Patient: Robyn Hill           Date of Birth: 11/03/1945           MRN: [id number] Visit Date: 05/03/2017              Requested by: Carlena Hurl, PA-C [address], [place] [id number] PCP: Carlena Hurl, PA-C   Assessment & Plan: Visit Diagnoses:  Impingement syndrome right shoulder with possible rotator cuff tear. Chronic lumbar pain with possible left sided sciatica  Plan: Subacromial cortisone injection right shoulder. MRI or CT scan lumbar spine depending upon compatibility of spinal stimulator. Long discussion regarding different diagnosis and treatment options of both right shoulder and lumbar spine. I have no notes from Dr. Melven Sartorius office and his treatment of her spine today. I'm just looking to see if there is anything that has changed that may be causing some of her pain. Over 30 minutes discussing all of the above  Follow-Up Instructions: No Follow-up on file.   Orders:  Orders Placed This Encounter  Procedures  . XR Shoulder Right  . XR Lumbar Spine 2-3 Views   No orders of the defined types were placed in this encounter.     Procedures: Large Joint Inj Date/Time: 05/03/2017 10:28 AM Performed by: Garald Balding Authorized by: Garald Balding   Consent Given by:  Patient Timeout: prior to procedure the correct patient, procedure, and site was verified   Indications:  Pain Location:  Shoulder Site:  L subacromial bursa Prep: patient was prepped and draped in usual sterile fashion   Needle Size:  25 G Needle Length:  1.5 inches Approach:  Lateral Ultrasound Guidance: No   Fluoroscopic Guidance: No   Arthrogram: No   Medications:  80 mg methylPREDNISolone acetate 40 MG/ML; 2 mL lidocaine 1 %; 2 mL bupivacaine 0.5 % Aspiration Attempted: No   Patient tolerance:  Patient tolerated the procedure well with no immediate complications     Clinical Data: No additional findings.   Subjective: Chief Complaint   Patient presents with  . Right Shoulder - Pain, Weakness    [name] is a 71 y o that presents with R sided back pain and 2. Right shoulder pain x 18 months.  She relates Dr. Vertell Limber placed a spine stimulator 10/2015, placed on a rib, spine stim. removed and placed on R side.   Prior history of 2 back surgeries with apparent failed back syndrome. Spinal stimulator inserted January 2017. Now having back pain with left lower extremity radiculopathy. Stimulator is program to help with left-sided pain but not "right-sided pain. Pain seems to be localized to the back and occasionally there is some referred pain anteriorly on the right side only. No abdominal complaints. Also complaining of right shoulder pain for the last several months without history of injury or trauma. Having trouble sleeping and raising her arm over her head.  HPI  Review of Systems   Objective: Vital Signs: BP 132/75   Pulse 79   Resp 14   Ht 5\' 5"  (1.651 m)   Wt 185 lb (83.9 kg)   BMI 30.79 kg/m   Physical Exam  Ortho Exam right shoulder with abduction and flexion only to 90 limited by pain. Positive impingement and positive empty can testing. Biceps intact. Good grip and release. No referred pain to shoulder with cervical spine motion neurovascular exam intact Straight leg raise negative bilaterally. Motor exam intact. Percussible tenderness of lumbar  spine diffusely. No abdominal findings  Specialty Comments:  No specialty comments available.  Imaging: No results found.   PMFS History: Patient Active Problem List   Diagnosis Date Noted  . Bone pain 03/30/2017  . Dyspnea 03/08/2017  . Urinary tract infection without hematuria 11/27/2016  . Constipation due to opioid therapy 09/21/2016  . History of back surgery 09/21/2016  . Nocturnal leg movements 08/26/2016  . Vitamin D deficiency 08/26/2016  . Periodic limb movement disorder 08/03/2016  . Chronic fatigue 08/03/2016  . Anemia of chronic disease  08/03/2016  . Post laminectomy syndrome 03/12/2016  . Medicare annual wellness visit, subsequent 02/19/2016  . Sweating abnormality 02/19/2016  . External hemorrhoid 02/19/2016  . Gastroesophageal reflux disease without esophagitis 02/19/2016  . Chronic pain 10/22/2015  . History of thyroid disease 08/06/2015  . Need for prophylactic vaccination and inoculation against influenza 08/06/2015  . Peripheral neuropathy 04/18/2015  . Arthritis 04/18/2015  . Prolapse of female bladder, acquired 01/29/2015  . Iron deficiency anemia secondary to blood loss (chronic) 05/02/2014  . History of uterine cancer 05/02/2014  . Essential hypertension, benign 01/03/2014  . Chronic back pain 01/03/2014  . Glaucoma 01/03/2014  . Leukocytosis 01/03/2014  . Spondylolisthesis of lumbar region 11/02/2013  . Diabetes mellitus with complication ([place]) 16/07/9603  . Elevated liver enzymes 11/25/2011  . Lipoma 11/25/2011  . Hyperlipidemia 02/23/2011   Past Medical History:  Diagnosis Date  . Adrenal mass, right ([place])   . Allergy   . Anemia   . Asthma    "as a teenager"  . Blind right eye    since childhood  . Blood transfusion 1970  . Chronic back pain   . Chronic diarrhea    Colestipol therapy  . Depression   . Diverticulitis   . Diverticulosis   . Fatty liver   . Female bladder prolapse   . GERD (gastroesophageal reflux disease)   . Glaucoma   . H/O bone density study 11/29/2014   normal study although mild decreased in density from prior study  . H/O hiatal hernia   . Hemorrhoids   . History of kidney stones   . History of uterine cancer    s/p hysterectomy  . Hyperlipidemia   . Hypertension   . Migraine    hx of migraines in past   . Neuromuscular disorder ([place])    diabetic neuropahthy  . Numbness and tingling of both legs   . Osteoarthritis   . Rheumatic fever    age 74  . Type II diabetes mellitus ([place])   . Urinary incontinence   . Vitamin D deficiency     Family History    Problem Relation Age of Onset  . Heart attack Brother 61  . Hypertension Other   . Diabetes Other   . Cancer Mother 66  . Aplastic anemia Mother 47  . Heart attack Brother 43  . Colon cancer Brother   . Heart attack Sister   . Heart attack Brother 73  . Anesthesia problems Neg Hx     Past Surgical History:  Procedure Laterality Date  . ABDOMINAL HYSTERECTOMY  1980   no further pap smears needed  . ANTERIOR AND POSTERIOR REPAIR N/A 01/29/2015   Procedure: CYSTOCELE AND RECTOCELE ;  Surgeon: Bjorn Loser, MD;  [facility];  Service: Urology;  Laterality: N/A;  . BACK SURGERY    . BREAST BIOPSY  08/1997   right  . CARDIAC CATHETERIZATION  11/2006  . CHOLECYSTECTOMY    .  COLONOSCOPY  2014   polyps, external hemorrhoids  . CYSTOSCOPY N/A 01/29/2015   Procedure: CYSTOSCOPY;  Surgeon: Bjorn Loser, MD;  [facility];  Service: Urology;  Laterality: N/A;  . DILATION AND CURETTAGE OF UTERUS  1980  . EDSI for back pain  last 01/2009   multiple x 5  . EYE SURGERY     Bil Blind R eye  . [place]  . LIVER BIOPSY  04/1999  . minor laceration repair     right eye  . POSTERIOR FUSION LUMBAR SPINE  04/05/12  . PULSE GENERATOR IMPLANT Right 03/12/2016   Procedure: Right Reposition of implantable pulse generator;  Surgeon: Erline Levine, MD;  Location: [place];  Service: Neurosurgery;  Laterality: Right;  Right Reposition of implantable pulse generator  . SPINAL CORD STIMULATOR INSERTION N/A 09/06/2015   Procedure: LUMBAR SPINAL CORD STIMULATOR INSERTION;  Surgeon: Clydell Hakim, MD;  Location: [place] [facility];  Service: Neurosurgery;  Laterality: N/A;  Spinal Cord Stimulator placement  . SPINAL CORD STIMULATOR INSERTION N/A 10/22/2015   Procedure: Laminectomy for spinal cord stimulator paddle lead and implantable generator placement;  Surgeon: Erline Levine, MD;  Location: [place] [facility];  Service: Neurosurgery;  Laterality: N/A;  Laminectomy for spinal cord  stimulator paddle lead and implantable generator placement  . steroid injections of hip  last 02/2009   multiple x 4  . TONSILLECTOMY AND ADENOIDECTOMY  1952  . TRABECULECTOMY  1996   bilaterally; "for glaucoma"  . UPPER GI ENDOSCOPY  05/21/14   food, retained in the body of the stomach  . VAGINAL PROLAPSE REPAIR N/A 01/29/2015   Procedure:  VAULT PROLAPSE WITH GRAFT ;  Surgeon: Bjorn Loser, MD;  [facility];  Service: Urology;  Laterality: N/A;   Social History   Occupational History  . Not on file.   Social History Main Topics  . Smoking status: Never Smoker  . Smokeless tobacco: Never Used  . Alcohol use Yes     Comment: rare  . Drug use: No  . Sexual activity: No     Comment: married     Garald Balding, MD   Note - This record has been created using [employer].  Chart creation errors have been sought, but may not always  have been located. Such creation errors do not reflect on  the standard of medical care.

## 2017-05-10 ENCOUNTER — Telehealth: Payer: Self-pay

## 2017-05-10 MED ORDER — EXENATIDE ER 2 MG ~~LOC~~ PEN: PEN_INJECTOR | SUBCUTANEOUS | 2 refills | Status: DC

## 2017-05-13 ENCOUNTER — Other Ambulatory Visit: Payer: Self-pay

## 2017-05-13 MED ORDER — EXENATIDE ER 2 MG/0.85ML ~~LOC~~ AUIJ: 1.0000 "pen " | AUTO-INJECTOR | SUBCUTANEOUS | 1 refills | Status: DC

## 2017-05-13 NOTE — Telephone Encounter (Signed)
Pt is requesting bydureon b-cise , she had got this back in march can she have a refill on this, she has been getting the regular bydureon.

## 2017-05-13 NOTE — Telephone Encounter (Signed)
[facility] sent note asking if her bydureon is supposed to be the bydureon b-cise. Please call and advise. Robyn Hill

## 2017-05-14 ENCOUNTER — Other Ambulatory Visit: Payer: Self-pay | Admitting: Medical

## 2017-05-14 MED ORDER — EXENATIDE ER 2 MG/0.85ML ~~LOC~~ AUIJ: 2.0000 mg | AUTO-INJECTOR | SUBCUTANEOUS | 2 refills | Status: DC

## 2017-05-14 NOTE — Telephone Encounter (Signed)
rx sent

## 2017-05-14 NOTE — Telephone Encounter (Signed)
Left message for pt

## 2017-05-24 DIAGNOSIS — H401133 Primary open-angle glaucoma, bilateral, severe stage: Secondary | ICD-10-CM | POA: Diagnosis not present

## 2017-05-26 DIAGNOSIS — M545 Low back pain: Secondary | ICD-10-CM | POA: Diagnosis not present

## 2017-05-26 DIAGNOSIS — M961 Postlaminectomy syndrome, not elsewhere classified: Secondary | ICD-10-CM | POA: Diagnosis not present

## 2017-05-26 DIAGNOSIS — R52 Pain, unspecified: Secondary | ICD-10-CM | POA: Diagnosis not present

## 2017-05-26 DIAGNOSIS — G588 Other specified mononeuropathies: Secondary | ICD-10-CM | POA: Diagnosis not present

## 2017-05-26 DIAGNOSIS — Z6832 Body mass index (BMI) 32.0-32.9, adult: Secondary | ICD-10-CM | POA: Diagnosis not present

## 2017-05-26 DIAGNOSIS — L905 Scar conditions and fibrosis of skin: Secondary | ICD-10-CM | POA: Diagnosis not present

## 2017-05-26 DIAGNOSIS — I10 Essential (primary) hypertension: Secondary | ICD-10-CM | POA: Diagnosis not present

## 2017-05-27 ENCOUNTER — Other Ambulatory Visit: Payer: Self-pay | Admitting: Neurosurgery

## 2017-05-27 ENCOUNTER — Telehealth (HOSPITAL_COMMUNITY): Payer: Self-pay | Admitting: Medical

## 2017-05-27 DIAGNOSIS — M961 Postlaminectomy syndrome, not elsewhere classified: Secondary | ICD-10-CM

## 2017-05-27 NOTE — Telephone Encounter (Signed)
05/27/17 pt called to [facility] said that she has a conflict

## 2017-05-29 ENCOUNTER — Encounter: Payer: Self-pay | Admitting: Medical

## 2017-05-31 ENCOUNTER — Encounter (HOSPITAL_COMMUNITY): Payer: Self-pay

## 2017-05-31 ENCOUNTER — Encounter (HOSPITAL_COMMUNITY): Payer: Self-pay | Admitting: Physical Therapy

## 2017-05-31 ENCOUNTER — Ambulatory Visit (HOSPITAL_COMMUNITY): Payer: Medicare Other | Attending: Neurosurgery | Admitting: Physical Therapy

## 2017-05-31 ENCOUNTER — Ambulatory Visit (HOSPITAL_COMMUNITY): Payer: Medicare Other | Admitting: Physical Therapy

## 2017-05-31 ENCOUNTER — Telehealth (HOSPITAL_COMMUNITY): Payer: Self-pay | Admitting: Physical Therapy

## 2017-05-31 DIAGNOSIS — G8929 Other chronic pain: Secondary | ICD-10-CM

## 2017-05-31 DIAGNOSIS — R29898 Other symptoms and signs involving the musculoskeletal system: Secondary | ICD-10-CM | POA: Diagnosis not present

## 2017-05-31 DIAGNOSIS — M545 Low back pain, unspecified: Secondary | ICD-10-CM

## 2017-05-31 DIAGNOSIS — R262 Difficulty in walking, not elsewhere classified: Secondary | ICD-10-CM | POA: Diagnosis not present

## 2017-05-31 DIAGNOSIS — M6281 Muscle weakness (generalized): Secondary | ICD-10-CM | POA: Diagnosis not present

## 2017-05-31 NOTE — Therapy (Signed)
[place], [place], [address] Phone: [phone]   Fax:  [phone]  Physical Therapy Evaluation  Patient Details  Name: Robyn Hill MRN: [id number] Date of Birth: 05/07/1946 Referring Provider: Karenann Cai   Encounter Date: 05/31/2017      PT End of Session - 05/31/17 2703    Visit Number 1   Number of Visits 6   Date for PT Re-Evaluation 06/21/17   Authorization Type Medicare/Medicaid    Authorization Time Period 05/31/17 to 06/21/17   Authorization - Visit Number 1   Authorization - Number of Visits 10   PT Start Time 5009   PT Stop Time 1557   PT Time Calculation (min) 41 min   Activity Tolerance Patient limited by pain   Behavior During Therapy [facility] for tasks assessed/performed      Past Medical History:  Diagnosis Date  . Adrenal mass, right ([place])   . Allergy   . Anemia   . Asthma    "as a teenager"  . Blind right eye    since childhood  . Blood transfusion 1970  . Chronic back pain   . Chronic diarrhea    Colestipol therapy  . Depression   . Diverticulitis   . Diverticulosis   . Fatty liver   . Female bladder prolapse   . GERD (gastroesophageal reflux disease)   . Glaucoma   . H/O bone density study 11/29/2014   normal study although mild decreased in density from prior study  . H/O hiatal hernia   . Hemorrhoids   . History of kidney stones   . History of uterine cancer    s/p hysterectomy  . Hyperlipidemia   . Hypertension   . Migraine    hx of migraines in past   . Neuromuscular disorder ([place])    diabetic neuropahthy  . Numbness and tingling of both legs   . Osteoarthritis   . Rheumatic fever    age 16  . Type II diabetes mellitus ([place])   . Urinary incontinence   . Vitamin D deficiency     Past Surgical History:  Procedure Laterality Date  . ABDOMINAL HYSTERECTOMY  1980   no further pap smears needed  . ANTERIOR AND POSTERIOR REPAIR N/A 01/29/2015   Procedure:  CYSTOCELE AND RECTOCELE ;  Surgeon: Bjorn Loser, MD;  Location: [facility];  Service: Urology;  Laterality: N/A;  . BACK SURGERY    . BREAST BIOPSY  08/1997   right  . CARDIAC CATHETERIZATION  11/2006  . CHOLECYSTECTOMY    . COLONOSCOPY  2014   polyps, external hemorrhoids  . CYSTOSCOPY N/A 01/29/2015   Procedure: CYSTOSCOPY;  Surgeon: Bjorn Loser, MD;  Location: [facility];  Service: Urology;  Laterality: N/A;  . DILATION AND CURETTAGE OF UTERUS  1980  . EDSI for back pain  last 01/2009   multiple x 5  . EYE SURGERY     Bil Blind R eye  . [place]  . LIVER BIOPSY  04/1999  . minor laceration repair     right eye  . POSTERIOR FUSION LUMBAR SPINE  04/05/12  . PULSE GENERATOR IMPLANT Right 03/12/2016   Procedure: Right Reposition of implantable pulse generator;  Surgeon: Erline Levine, MD;  Location: [facility];  Service: Neurosurgery;  Laterality: Right;  Right Reposition of implantable pulse generator  . SPINAL CORD STIMULATOR INSERTION N/A 09/06/2015   Procedure: LUMBAR SPINAL CORD STIMULATOR INSERTION;  Surgeon: Clydell Hakim, MD;  Location: [facility];  Service: Neurosurgery;  Laterality: N/A;  Spinal Cord Stimulator placement  . SPINAL CORD STIMULATOR INSERTION N/A 10/22/2015   Procedure: Laminectomy for spinal cord stimulator paddle lead and implantable generator placement;  Surgeon: Erline Levine, MD;  Location: [place] NEURO ORS;  Service: Neurosurgery;  Laterality: N/A;  Laminectomy for spinal cord stimulator paddle lead and implantable generator placement  . steroid injections of hip  last 02/2009   multiple x 4  . TONSILLECTOMY AND ADENOIDECTOMY  1952  . TRABECULECTOMY  1996   bilaterally; "for glaucoma"  . UPPER GI ENDOSCOPY  05/21/14   food, retained in the body of the stomach  . VAGINAL PROLAPSE REPAIR N/A 01/29/2015   Procedure:  VAULT PROLAPSE WITH GRAFT ;  Surgeon: Bjorn Loser, MD;  Location: [facility];  Service: Urology;  Laterality: N/A;    There were no  vitals filed for this visit.       Subjective Assessment - 05/31/17 1519    Subjective Patient states that she out of the blue had back pain about 30 years ago and was offered surgery; she had traction done instead and suffered with pain all of these years. She decided to finally have surgery on her back (multiple fusions), and later received an internal stimulator for her back pain however had extreme pain on her R lower back following stimulator surgery and this continued for some time. THey later found that the stimulator was on the last rib and they went in surgically to move the stimulator. She has been told that maybe it is scar tissue, nerve damage, etc. She recently received an x-ray which showed bulging discs on teh L. She finally got a PA-C to send her for attention to scar tissue and back pain    How long can you sit comfortably? sitting is the best with pillows propped behind her, unlimited    How long can you stand comfortably? immediate pain    How long can you walk comfortably? immediate pain    Patient Stated Goals be able to roll on side, reduce pain    Currently in Pain? Yes  at worst 9/10 pain    Pain Score 6    Pain Location Back   Pain Orientation Right;Lower   Pain Descriptors / Indicators Burning;Stabbing;Shooting   Pain Type Chronic pain   Pain Radiating Towards sometimes shoots from back to the front of her R hip    Pain Onset More than a month ago   Pain Frequency Constant   Aggravating Factors  standing/walking, laying on her side    Pain Relieving Factors sitting with pillows, medicine    Effect of Pain on Daily Activities severe             [facility] PT Assessment - 05/31/17 0001      Assessment   Medical Diagnosis back scar tissue    Referring Provider Karenann Cai    Onset Date/Surgical Date --  chronic past 50 yeras    Next MD Visit Dr. Luan Pulling on teh 22nd    Prior Therapy PT in the 80s      Precautions   Precautions Other (comment)    Precaution Comments blkndness R eye/ nearly blind L eye      Balance Screen   Has the patient fallen in the past 6 months No   Has the patient had a decrease in activity level because of a fear of falling?  Yes  Is the patient reluctant to leave their home because of a fear of falling?  No     Prior Function   Level of Independence Independent;Independent with basic ADLs;Independent with gait;Independent with transfers   Vocation Retired     AROM   Lumbar Flexion WFL; RFIS no change    Lumbar Extension moderate limitation    Lumbar - Right Side Bend severe limitation    Lumbar - Left Side Bend severe limitation      Strength   Right Hip Flexion 4-/5   Right Hip Extension 3-/5   Right Hip ABduction 4-/5   Left Hip Flexion 4-/5   Left Hip Extension 3-/5   Left Hip ABduction 3+/5   Right Knee Flexion 4-/5   Right Knee Extension 4/5   Left Knee Flexion 4-/5   Left Knee Extension 4/5   Right Ankle Dorsiflexion 5/5   Left Ankle Dorsiflexion 5/5     Palpation   Palpation comment 2 horizontal scars on R side of posteior trunk; both scars appear to be moving well but did note spasm underneath and in lumbar paraspinals             Objective measurements completed on examination: See above findings.                  PT Education - 05/31/17 1751    Education provided Yes   Education Details exam findings, POC; extensive education regarding nerve symptoms including radicular pain, versus muscle spasm, also regardnig kinetic chain, sedentary lifestyle,  and role of muscle spasm/weakness/poor mechanics in relation to ongoing back pain    Person(s) Educated Patient   Methods Explanation   Comprehension Verbalized understanding;Need further instruction          PT Short Term Goals - 05/31/17 1757      PT SHORT TERM GOAL #1   Title Patient to experience pain as being no more than 5/10 in order to improve QOL and sleep patterns    Time 3   Period Weeks   Status  New   Target Date 06/21/17     PT SHORT TERM GOAL #2   Title Patient to demonsrate improvement of lumbar ROM on all planes by at least 40% without increase in pain  in order to show improved tolerance to movement    Time 3   Period Weeks   Status New     PT SHORT TERM GOAL #3   Title Patient to demonstrate at least a 30% reduction in muscle spasms in assist in reducing pain and to improve QOL    Time 3   Period Weeks   Status New     PT SHORT TERM GOAL #4   Title Patient to be compliant with appropriate HEP, to be udpated as appropriate and tolerated    Time 1   Period Weeks   Status New   Target Date 06/07/17           PT Long Term Goals - 05/31/17 1759      PT LONG TERM GOAL #1   Title No LTGs appropriate at this time will update if necessary pending reults of trial of PT                 Plan - 05/31/17 1754    Clinical Impression Statement Patient arrives with a long history of chronic LBP which she reports started 30 years ago; she has had multiple surgeries including lumbar fusions and multiple stimulator implants,  however continues to have chronic pain that severely limits her function. She is adamant that her pain is due to nerve damage however states her referring provider wonders if scar tissue is contributing to her pain. Examination reveals poor posture, general severe functional weakness, limited lumbar ROM, very poor core strength, and multiple muscle spasms noted in lumbar region. Examined scars however they seem very mobile and fairly unlikely to be contributing to pain; note that scars themselves are not painful to palpation but directly inferior is very tender. Patient also describes shooting pain that radiates from her back around to the front of her R hip. Recommend trial of skilled PT services to attempt to reduce pain and address functional impairments; plan to discuss more extensive POC moving forward next session.    History and Personal Factors  relevant to plan of care: multiple lumbar and stimulator surgeries, chronicity of pain, sedentary lifestyle    Clinical Presentation Stable   Clinical Presentation due to: chronicity of pain, sedntary lifestyle, post-surgical state    Clinical Decision Making Moderate   Rehab Potential Fair   Clinical Impairments Affecting Rehab Potential (-) chronicity of pain, severity of pain, multiple lumbar surgeries, obesity, sedentary lifestyle    PT Frequency 2x / week   PT Duration 3 weeks   PT Treatment/Interventions ADLs/Self Care Home Management;Biofeedback;Cryotherapy;Moist Heat;DME Instruction;Gait training;Stair training;Functional mobility training;Therapeutic activities;Therapeutic exercise;Balance training;Neuromuscular re-education;Patient/family education;Manual techniques;Scar mobilization;Passive range of motion   PT Next Visit Plan NEEDS TO BE WITH PT. Continue to discuss possible role of musculoskeletal findings in pain, possible benefits of PT in managing pain. Assign HEP as tolerated.    PT Home Exercise Plan did not assign, time limitation    Consulted and Agree with Plan of Care Patient      Patient will benefit from skilled therapeutic intervention in order to improve the following deficits and impairments:  Abnormal gait, Increased fascial restricitons, Pain, Decreased mobility, Increased muscle spasms, Postural dysfunction, Decreased strength, Hypomobility, Difficulty walking, Impaired flexibility  Visit Diagnosis: Chronic bilateral low back pain without sciatica - Plan: PT plan of care cert/re-cert  Muscle weakness (generalized) - Plan: PT plan of care cert/re-cert  Other symptoms and signs involving the musculoskeletal system - Plan: PT plan of care cert/re-cert  Difficulty in walking, not elsewhere classified - Plan: PT plan of care cert/re-cert      G-Codes - 31/54/00 1800    Functional Assessment Tool Used (Outpatient Only) Based on skilled clinical assessment of ROM,  posture, pain patterns, strength, tolerance to mobilty    Functional Limitation Mobility: Walking and moving around   Mobility: Walking and Moving Around Current Status ([id number]) At least 80 percent but less than 100 percent impaired, limited or restricted   Mobility: Walking and Moving Around Goal Status [phone]) At least 60 percent but less than 80 percent impaired, limited or restricted       Problem List Patient Active Problem List   Diagnosis Date Noted  . Bone pain 03/30/2017  . Dyspnea 03/08/2017  . Urinary tract infection without hematuria 11/27/2016  . Constipation due to opioid therapy 09/21/2016  . History of back surgery 09/21/2016  . Nocturnal leg movements 08/26/2016  . Vitamin D deficiency 08/26/2016  . Periodic limb movement disorder 08/03/2016  . Chronic fatigue 08/03/2016  . Anemia of chronic disease 08/03/2016  . Post laminectomy syndrome 03/12/2016  . Medicare annual wellness visit, subsequent 02/19/2016  . Sweating abnormality 02/19/2016  . External hemorrhoid 02/19/2016  . Gastroesophageal reflux disease without esophagitis 02/19/2016  .  Chronic pain 10/22/2015  . History of thyroid disease 08/06/2015  . Need for prophylactic vaccination and inoculation against influenza 08/06/2015  . Peripheral neuropathy 04/18/2015  . Arthritis 04/18/2015  . Prolapse of female bladder, acquired 01/29/2015  . Iron deficiency anemia secondary to blood loss (chronic) 05/02/2014  . History of uterine cancer 05/02/2014  . Essential hypertension, benign 01/03/2014  . Chronic back pain 01/03/2014  . Glaucoma 01/03/2014  . Leukocytosis 01/03/2014  . Spondylolisthesis of lumbar region 11/02/2013  . Diabetes mellitus with complication ([place]) 19/37/9024  . Elevated liver enzymes 11/25/2011  . Lipoma 11/25/2011  . Hyperlipidemia 02/23/2011    Deniece Ree PT, DPT [phone]  [facility] [address] [place], [place],  [address] Phone: [phone]   Fax:  [phone]  Name: Robyn Hill MRN: [id number] Date of Birth: 02-09-1946

## 2017-05-31 NOTE — Telephone Encounter (Signed)
Patient was late and got resched

## 2017-06-03 ENCOUNTER — Ambulatory Visit
Admission: RE | Admit: 2017-06-03 | Discharge: 2017-06-03 | Disposition: A | Discharge status: Home / Self Care | Payer: Medicare Other | Source: Ambulatory Visit | Attending: Neurosurgery | Admitting: Neurosurgery

## 2017-06-03 ENCOUNTER — Encounter (HOSPITAL_COMMUNITY): Payer: Medicare Other

## 2017-06-03 VITALS — BP 91/41 | HR 60

## 2017-06-03 DIAGNOSIS — M961 Postlaminectomy syndrome, not elsewhere classified: Secondary | ICD-10-CM

## 2017-06-03 DIAGNOSIS — M4316 Spondylolisthesis, lumbar region: Secondary | ICD-10-CM

## 2017-06-03 DIAGNOSIS — M4326 Fusion of spine, lumbar region: Secondary | ICD-10-CM | POA: Diagnosis not present

## 2017-06-03 MED ORDER — DIAZEPAM 5 MG PO TABS
5.0000 mg | ORAL_TABLET | Freq: Once | ORAL | Status: AC
  Administered 2017-06-03: 5 mg via ORAL

## 2017-06-03 MED ORDER — IOPAMIDOL (ISOVUE-M 200) INJECTION 41%
15.0000 mL | Freq: Once | INTRAMUSCULAR | Status: AC
  Administered 2017-06-03: 15 mL via INTRATHECAL

## 2017-06-03 MED ORDER — MEPERIDINE HCL 100 MG/ML IJ SOLN
75.0000 mg | Freq: Once | INTRAMUSCULAR | Status: AC
  Administered 2017-06-03: 75 mg via INTRAMUSCULAR

## 2017-06-03 MED ORDER — ONDANSETRON HCL 4 MG/2ML IJ SOLN
4.0000 mg | Freq: Once | INTRAMUSCULAR | Status: AC
  Administered 2017-06-03: 4 mg via INTRAMUSCULAR

## 2017-06-03 NOTE — Progress Notes (Signed)
Patient states she has been off Cymbalta for at least the past two days.  Brita Romp, RN

## 2017-06-03 NOTE — Discharge Instructions (Signed)
Myelogram Discharge Instructions  1. Go home and rest quietly for the next 24 hours.  It is important to lie flat for the next 24 hours.  Get up only to go to the restroom.  You may lie in the bed or on a couch on your back, your stomach, your left side or your right side.  You may have one pillow under your head.  You may have pillows between your knees while you are on your side or under your knees while you are on your back.  2. DO NOT drive today.  Recline the seat as far back as it will go, while still wearing your seat belt, on the way home.  3. You may get up to go to the bathroom as needed.  You may sit up for 10 minutes to eat.  You may resume your normal diet and medications unless otherwise indicated.  Drink lots of extra fluids today and tomorrow.  4. The incidence of headache, nausea, or vomiting is about 5% (one in 20 patients).  If you develop a headache, lie flat and drink plenty of fluids until the headache goes away.  Caffeinated beverages may be helpful.  If you develop severe nausea and vomiting or a headache that does not go away with flat bed rest, call [phone].  5. You may resume normal activities after your 24 hours of bed rest is over; however, do not exert yourself strongly or do any heavy lifting tomorrow. If when you get up you have a headache when standing, go back to bed and force fluids for another 24 hours.  6. Call your physician for a follow-up appointment.  The results of your myelogram will be sent directly to your physician by the following day.  7. If you have any questions or if complications develop after you arrive home, please call [phone].  Discharge instructions have been explained to the patient.  The patient, or the person responsible for the patient, fully understands these instructions.       May resume Cymbalta on Aug. 16, 2018, after 9:30 am.

## 2017-06-04 ENCOUNTER — Other Ambulatory Visit: Payer: Self-pay | Admitting: Medical

## 2017-06-04 NOTE — Telephone Encounter (Signed)
Can pt have a refill on this 

## 2017-06-07 ENCOUNTER — Ambulatory Visit (HOSPITAL_COMMUNITY): Payer: Medicare Other | Admitting: Physical Therapy

## 2017-06-07 ENCOUNTER — Telehealth (HOSPITAL_COMMUNITY): Payer: Self-pay | Admitting: Medical

## 2017-06-07 NOTE — Telephone Encounter (Signed)
06/07/17  left a message on Sunday that her husband came home very ill from church and doesn't think he will be any better by today so she won't be coming

## 2017-06-08 ENCOUNTER — Telehealth (HOSPITAL_COMMUNITY): Payer: Self-pay | Admitting: Medical

## 2017-06-08 NOTE — Telephone Encounter (Signed)
06/08/17  pt left a message to cx - she said she was in bed becuase of her back and she can hardly move

## 2017-06-09 ENCOUNTER — Telehealth (HOSPITAL_COMMUNITY): Payer: Self-pay | Admitting: Medical

## 2017-06-09 ENCOUNTER — Ambulatory Visit (HOSPITAL_COMMUNITY): Payer: Medicare Other | Admitting: Physical Therapy

## 2017-06-09 NOTE — Telephone Encounter (Signed)
06/08/17 pt left a message to cx the Wed appt because she was in bed with her back, she said she can hardly move

## 2017-06-10 ENCOUNTER — Other Ambulatory Visit: Payer: Self-pay | Admitting: Medical

## 2017-06-10 NOTE — Telephone Encounter (Signed)
Pt was in June for DM

## 2017-06-14 DIAGNOSIS — M5416 Radiculopathy, lumbar region: Secondary | ICD-10-CM | POA: Diagnosis not present

## 2017-06-22 ENCOUNTER — Other Ambulatory Visit: Payer: Self-pay

## 2017-06-22 ENCOUNTER — Telehealth: Payer: Self-pay | Admitting: Family Medicine

## 2017-06-22 DIAGNOSIS — E119 Type 2 diabetes mellitus without complications: Secondary | ICD-10-CM

## 2017-06-22 MED ORDER — GLUCOSE BLOOD VI STRP: ORAL_STRIP | 1 refills | Status: DC

## 2017-06-22 MED ORDER — ACCU-CHEK SOFTCLIX LANCETS MISC: 5 refills | Status: DC

## 2017-06-22 NOTE — Telephone Encounter (Signed)
Sent in refill for testing supplies

## 2017-06-22 NOTE — Telephone Encounter (Signed)
pls call out, testing 1-3 times daily

## 2017-06-22 NOTE — Telephone Encounter (Signed)
Patient called for Accucheck Plus test strips and Lancets to the [facility] [place]

## 2017-06-23 ENCOUNTER — Other Ambulatory Visit: Payer: Self-pay

## 2017-06-23 DIAGNOSIS — E119 Type 2 diabetes mellitus without complications: Secondary | ICD-10-CM

## 2017-06-23 MED ORDER — GLUCOSE BLOOD VI STRP: ORAL_STRIP | 1 refills | Status: DC

## 2017-06-28 ENCOUNTER — Other Ambulatory Visit: Payer: Self-pay | Admitting: Medical

## 2017-07-05 ENCOUNTER — Telehealth: Payer: Self-pay | Admitting: Internal Medicine

## 2017-07-05 NOTE — Telephone Encounter (Signed)
[facility] pharmacy in [place] sent a request asking for softclix lancets to be sent in again with icd-10 and if any refills. ICD-10- E11.69 does medicare say invalid dx code.

## 2017-07-05 NOTE — Telephone Encounter (Signed)
pls send refills on whatever she needs for testing supplies, 5 refills on supplies

## 2017-07-06 NOTE — Telephone Encounter (Signed)
Gave  Form to Robyn Hill to gave him  Get another dx to fill rx.

## 2017-07-07 ENCOUNTER — Other Ambulatory Visit: Payer: Self-pay | Admitting: Medical

## 2017-08-12 ENCOUNTER — Other Ambulatory Visit (INDEPENDENT_AMBULATORY_CARE_PROVIDER_SITE_OTHER): Payer: Medicare Other

## 2017-08-12 DIAGNOSIS — Z23 Encounter for immunization: Secondary | ICD-10-CM | POA: Diagnosis not present

## 2017-08-15 ENCOUNTER — Other Ambulatory Visit (HOSPITAL_COMMUNITY): Payer: Self-pay | Admitting: Medical

## 2017-08-25 DIAGNOSIS — M25511 Pain in right shoulder: Secondary | ICD-10-CM | POA: Diagnosis not present

## 2017-08-25 DIAGNOSIS — R52 Pain, unspecified: Secondary | ICD-10-CM | POA: Diagnosis not present

## 2017-08-25 DIAGNOSIS — M5416 Radiculopathy, lumbar region: Secondary | ICD-10-CM | POA: Diagnosis not present

## 2017-09-06 ENCOUNTER — Other Ambulatory Visit: Payer: Self-pay | Admitting: Medical

## 2017-09-06 NOTE — Telephone Encounter (Signed)
Can pt have a refill on meds  

## 2017-09-13 ENCOUNTER — Other Ambulatory Visit: Payer: Self-pay | Admitting: Medical

## 2017-09-20 DIAGNOSIS — M5416 Radiculopathy, lumbar region: Secondary | ICD-10-CM | POA: Diagnosis not present

## 2017-09-23 ENCOUNTER — Other Ambulatory Visit: Payer: Self-pay

## 2017-09-27 ENCOUNTER — Other Ambulatory Visit: Payer: Self-pay | Admitting: Medical

## 2017-10-14 ENCOUNTER — Telehealth: Payer: Self-pay | Admitting: Medical

## 2017-10-14 ENCOUNTER — Other Ambulatory Visit: Payer: Self-pay

## 2017-10-14 DIAGNOSIS — E119 Type 2 diabetes mellitus without complications: Secondary | ICD-10-CM

## 2017-10-14 MED ORDER — GLUCOSE BLOOD VI STRP: ORAL_STRIP | 1 refills | Status: DC

## 2017-10-14 NOTE — Telephone Encounter (Signed)
Pt needs new script for Accu-check [employer] plus strips sent to [place]

## 2017-10-14 NOTE — Telephone Encounter (Signed)
Done

## 2017-10-31 ENCOUNTER — Telehealth: Payer: Self-pay | Admitting: Medical

## 2017-10-31 NOTE — Telephone Encounter (Signed)
Pt called & left message that [employer] P.A. Expiring and she is unable to tolerate other medications due to they cause urinary tract infections.  Completed P.A. [employer]

## 2017-11-01 NOTE — Telephone Encounter (Signed)
Left message for pt

## 2017-11-15 NOTE — Telephone Encounter (Signed)
Never received response from P.A. So called [employer] t# [phone] said P.A. Approved, til 10/18/18, call pharmacy went thru $3.80, called pt and informed

## 2017-11-23 ENCOUNTER — Other Ambulatory Visit: Payer: Self-pay | Admitting: Medical

## 2017-11-23 NOTE — Telephone Encounter (Signed)
Can pt have a refill on meds  

## 2017-11-24 DIAGNOSIS — M5416 Radiculopathy, lumbar region: Secondary | ICD-10-CM | POA: Diagnosis not present

## 2017-11-24 DIAGNOSIS — H401133 Primary open-angle glaucoma, bilateral, severe stage: Secondary | ICD-10-CM | POA: Diagnosis not present

## 2017-11-24 DIAGNOSIS — R52 Pain, unspecified: Secondary | ICD-10-CM | POA: Diagnosis not present

## 2017-12-20 ENCOUNTER — Other Ambulatory Visit: Payer: Self-pay | Admitting: Medical

## 2017-12-20 NOTE — Telephone Encounter (Signed)
Called and l/m for pt to call us back to set up an appt.

## 2017-12-30 ENCOUNTER — Ambulatory Visit (INDEPENDENT_AMBULATORY_CARE_PROVIDER_SITE_OTHER): Payer: Medicare Other | Admitting: Medical

## 2017-12-30 ENCOUNTER — Encounter: Payer: Self-pay | Admitting: Medical

## 2017-12-30 VITALS — BP 126/80 | HR 59 | Wt 181.6 lb

## 2017-12-30 DIAGNOSIS — D72829 Elevated white blood cell count, unspecified: Secondary | ICD-10-CM | POA: Diagnosis not present

## 2017-12-30 DIAGNOSIS — R258 Other abnormal involuntary movements: Secondary | ICD-10-CM

## 2017-12-30 DIAGNOSIS — Z8639 Personal history of other endocrine, nutritional and metabolic disease: Secondary | ICD-10-CM | POA: Diagnosis not present

## 2017-12-30 DIAGNOSIS — Z9889 Other specified postprocedural states: Secondary | ICD-10-CM | POA: Diagnosis not present

## 2017-12-30 DIAGNOSIS — M1990 Unspecified osteoarthritis, unspecified site: Secondary | ICD-10-CM

## 2017-12-30 DIAGNOSIS — I10 Essential (primary) hypertension: Secondary | ICD-10-CM

## 2017-12-30 DIAGNOSIS — K219 Gastro-esophageal reflux disease without esophagitis: Secondary | ICD-10-CM

## 2017-12-30 DIAGNOSIS — E785 Hyperlipidemia, unspecified: Secondary | ICD-10-CM

## 2017-12-30 DIAGNOSIS — G609 Hereditary and idiopathic neuropathy, unspecified: Secondary | ICD-10-CM | POA: Diagnosis not present

## 2017-12-30 DIAGNOSIS — M5442 Lumbago with sciatica, left side: Secondary | ICD-10-CM

## 2017-12-30 DIAGNOSIS — E559 Vitamin D deficiency, unspecified: Secondary | ICD-10-CM

## 2017-12-30 DIAGNOSIS — G4761 Periodic limb movement disorder: Secondary | ICD-10-CM | POA: Diagnosis not present

## 2017-12-30 DIAGNOSIS — R109 Unspecified abdominal pain: Secondary | ICD-10-CM

## 2017-12-30 DIAGNOSIS — E118 Type 2 diabetes mellitus with unspecified complications: Secondary | ICD-10-CM

## 2017-12-30 DIAGNOSIS — G8929 Other chronic pain: Secondary | ICD-10-CM

## 2017-12-30 LAB — POCT GLYCOSYLATED HEMOGLOBIN (HGB A1C): Hemoglobin A1C: 5.9

## 2017-12-30 NOTE — Progress Notes (Signed)
Subjective: Chief Complaint  Patient presents with  . Diabetes    dm check    Here for med check.    Diabetes - compliant with Bydureon pen 2mg  weekly, Toujeo  10u QHS, but sometimes goes up to 12u if sugars running high.  Been getting good numbers overall.  Doesn't like to see numbers towards 70.  Averaging around 90 morning glucose.   Has had some foot sores, but they have healed.  She watches her feet closely.  Hyperlipidemia - taking Lipitor 40mg  daily.  HTN - compliant with Enalapril 20mg  daily and Hydrochlorothiazide 25mg  daily  Hx/o iron deficiency - compliant with Fergon 324mg  iron daily.  No blood in stool or urine, no unusual bleeding or bruising  Vit D deficiency - still taking weekly prescriptoin vitamin D  constipation - no recent problems.  Not having to use Linzess but every so often.  GERD - Dexilant 60mg .  She is aware of risks.  Hasn't done well on any other treatment.     Neuropathy - sees Dr. Lovenia Hill at neurosurgery office.  In recent months was changed off Topamax and changed to Keppra 250mg  once daily.   No longer having to use Requip, and still using Norco daily.   Still taking Cymbalta 60mg  daily  Lately not having problems with legs or sleep at night so not using the Requip.     Gets rash/eczema in arm folds antecubital and in ears.  Using Aveeno lotion  No other aggravating or relieving factors. No other complaint.   Past Medical History:  Diagnosis Date  . Adrenal mass, right ([place])   . Allergy   . Anemia   . Asthma    "as a teenager"  . Blind right eye    since childhood  . Blood transfusion 1970  . Chronic back pain   . Chronic diarrhea    Colestipol therapy  . Depression   . Diverticulitis   . Diverticulosis   . Fatty liver   . Female bladder prolapse   . GERD (gastroesophageal reflux disease)   . Glaucoma   . H/O bone density study 11/29/2014   normal study although mild decreased in density from prior study  . H/O hiatal hernia   .  Hemorrhoids   . History of kidney stones   . History of uterine cancer    s/p hysterectomy  . Hyperlipidemia   . Hypertension   . Migraine    hx of migraines in past   . Neuromuscular disorder ([place])    diabetic neuropahthy  . Numbness and tingling of both legs   . Osteoarthritis   . Rheumatic fever    age 47  . Type II diabetes mellitus ([place])   . Urinary incontinence   . Vitamin D deficiency    Current Outpatient Medications on File Prior to Visit  Medication Sig Dispense Refill  . ACCU-CHEK SOFTCLIX LANCETS lancets TEST DAILY 100 each 5  . atorvastatin (LIPITOR) 40 MG tablet TAKE 1 TABLET BY MOUTH ONCE DAILY AT BEDTIME 90 tablet 1  . B-D UF III MINI PEN NEEDLES 31G X 5 MM MISC USE TO INJECT TOUJEO 100 each 3  . Blood Glucose Calibration (ACCU-CHEK INSTANT CONTROL) LIQD Use for controls for accu-chek activa meter 1 each 0  . BYDUREON BCISE 2 MG/0.85ML AUIJ INJECT CONTENTS OF ONE SYRINGE (2MG ) SUB-Q ONCE A WEEK 4 pen 2  . DEXILANT 60 MG capsule TAKE 1 CAPSULE (60 MG TOTAL) BY MOUTH TWICE DAILY 180 capsule 1  .  dorzolamide-timolol (COSOPT) 22.3-6.8 MG/ML ophthalmic solution Place 1 drop into both eyes 2 (two) times daily.    . DULoxetine (CYMBALTA) 60 MG capsule TAKE 1 CAPSULE BY MOUTH ONCE DAILY 90 capsule 0  . enalapril (VASOTEC) 20 MG tablet TAKE 1 TABLET BY MOUTH ONCE DAILY 90 tablet 0  . ferrous gluconate (FERGON) 324 MG tablet Take 324 mg by mouth daily with breakfast.    . glucose blood (ACCU-CHEK AVIVA PLUS) test strip Test BID. Dx Code- E11.8 300 each 1  . hydrochlorothiazide (HYDRODIURIL) 25 MG tablet TAKE 1 TABLET BY MOUTH ONCE DAILY 90 tablet 3  . HYDROcodone-acetaminophen (NORCO) 10-325 MG tablet 1 tablet every 6 (six) hours.    . Lancets (ACCU-CHEK SOFT TOUCH) lancets Test 2-3 times a day 100 each 5  . levETIRAcetam (KEPPRA) 250 MG tablet Take 250 mg by mouth daily.    Marland Kitchen linaclotide (LINZESS) 145 MCG CAPS capsule Take 1 capsule (145 mcg total) by mouth daily before  breakfast. 90 capsule 1  . metFORMIN (GLUCOPHAGE) 1000 MG tablet TAKE 1 TABLET BY MOUTH TWICE DAILY WITH A MEAL 180 tablet 0  . mupirocin ointment (BACTROBAN) 2 % Apply 1 application topically 2 (two) times daily as needed. Reported on 04/24/2016    . rOPINIRole (REQUIP) 0.5 MG tablet TAKE 1 TABLET BY MOUTH AT BEDTIME FOR LIMB MOVEMENT DISORDER 90 tablet 3  . TOUJEO SOLOSTAR 300 UNIT/ML SOPN INJECT 12 UNITS SUBCUTANEOUSLY AT BEDTIME 3 mL 1  . triamcinolone cream (KENALOG) 0.1 % Apply 1 application topically as needed (for skin irritation). Reported on 04/24/2016    . Vitamin D, Ergocalciferol, (DRISDOL) 50000 units CAPS capsule Take 1 capsule (50,000 Units total) by mouth every 7 (seven) days. 12 capsule 1  . Insulin Pen Needle (PEN NEEDLES) 31G X 6 MM MISC Use these for Toujeo pen. 200 each 11  . latanoprost (XALATAN) 0.005 % ophthalmic solution Place 1 drop into the right eye at bedtime.     No current facility-administered medications on file prior to visit.      Past Surgical History:  Procedure Laterality Date  . ABDOMINAL HYSTERECTOMY  1980   no further pap smears needed  . ANTERIOR AND POSTERIOR REPAIR N/A 01/29/2015   Procedure: CYSTOCELE AND RECTOCELE ;  Surgeon: Robyn Loser, MD;  Location: [facility];  Service: Urology;  Laterality: N/A;  . BACK SURGERY    . BREAST BIOPSY  08/1997   right  . CARDIAC CATHETERIZATION  11/2006  . CHOLECYSTECTOMY    . COLONOSCOPY  2014   polyps, external hemorrhoids  . CYSTOSCOPY N/A 01/29/2015   Procedure: CYSTOSCOPY;  Surgeon: Robyn Loser, MD;  Location: [facility];  Service: Urology;  Laterality: N/A;  . DILATION AND CURETTAGE OF UTERUS  1980  . EDSI for back pain  last 01/2009   multiple x 5  . EYE SURGERY     Bil Blind R eye  . [place]  . LIVER BIOPSY  04/1999  . minor laceration repair     right eye  . POSTERIOR FUSION LUMBAR SPINE  04/05/12  . PULSE GENERATOR IMPLANT Right 03/12/2016   Procedure: Right Reposition of  implantable pulse generator;  Surgeon: Robyn Levine, MD;  Location: [place];  Service: Neurosurgery;  Laterality: Right;  Right Reposition of implantable pulse generator  . SPINAL CORD STIMULATOR INSERTION N/A 09/06/2015   Procedure: LUMBAR SPINAL CORD STIMULATOR INSERTION;  Surgeon: Clydell Hakim, MD;  Location: [place] [facility];  Service: Neurosurgery;  Laterality:  N/A;  Spinal Cord Stimulator placement  . SPINAL CORD STIMULATOR INSERTION N/A 10/22/2015   Procedure: Laminectomy for spinal cord stimulator paddle lead and implantable generator placement;  Surgeon: Robyn Levine, MD;  Location: [place] [facility];  Service: Neurosurgery;  Laterality: N/A;  Laminectomy for spinal cord stimulator paddle lead and implantable generator placement  . steroid injections of hip  last 02/2009   multiple x 4  . TONSILLECTOMY AND ADENOIDECTOMY  1952  . TRABECULECTOMY  1996   bilaterally; "for glaucoma"  . UPPER GI ENDOSCOPY  05/21/14   food, retained in the body of the stomach  . VAGINAL PROLAPSE REPAIR N/A 01/29/2015   Procedure:  VAULT PROLAPSE WITH GRAFT ;  Surgeon: Robyn Loser, MD;  Location: [facility];  Service: Urology;  Laterality: N/A;    ROS as in subjective     Objective: BP 126/80   Pulse (!) 59   Wt 181 lb 9.6 oz (82.4 kg)   SpO2 97%   BMI 30.22 kg/m   Wt Readings from Last 3 Encounters:  12/30/17 181 lb 9.6 oz (82.4 kg)  05/03/17 185 lb (83.9 kg)  03/30/17 185 lb 9.6 oz (84.2 kg)   BP Readings from Last 3 Encounters:  12/30/17 126/80  06/03/17 (!) 91/41  05/03/17 132/75   General appearance: alert, no distress, WD/WN,  Neck: supple, no lymphadenopathy, no thyromegaly, no masses Heart: RRR, normal S1, S2, no murmurs Lungs: CTA bilaterally, no wheezes, rhonchi, or rales Pulses: 1+ symmetric, upper and lower extremities, normal cap refill Ext: no edema Abdomen: +bs, soft, right inguinal tenderness localized over inguinal ligament but no hernia, no mass, lower surgical scar inferior to  umbilicus Mild rough patch of skin of bilat upper ear lobes, no other rash Hips nontneder, normal ROM  Diabetic Foot Exam - Simple   Simple Foot Form Diabetic Foot exam was performed with the following findings:  Yes 12/30/2017  5:01 PM  Visual Inspection See comments:  Yes Sensation Testing See comments:  Yes Pulse Check See comments:  Yes Comments 1+ pedal pulses, minimal sensation of monofilament throughout both lower feet, no obvious skin lesions other than small healing wound on the top of left dorsal foot      Assessment: Encounter Diagnoses  Name Primary?  . Diabetes mellitus with complication ([place]) Yes  . Essential hypertension, benign   . Gastroesophageal reflux disease without esophagitis   . Idiopathic peripheral neuropathy   . Chronic bilateral low back pain with left-sided sciatica   . Arthritis   . History of back surgery   . History of thyroid disease   . Hyperlipidemia, unspecified hyperlipidemia type   . Leukocytosis, unspecified type   . Vitamin D deficiency   . Nocturnal leg movements   . Periodic limb movement disorder   . Abdominal pain, unspecified abdominal location      Plan: Diabetes-discussed routine care, yearly eye doctor visit, daily foot checks, glucose monitoring, continue current medications. A1c improved today under 6% now  Hypertension-continue current medication  GERD-discussed risk of long-term use of PPI therapy.  She is aware of the risks and feels she cannot go without this medication.  Continue current medicine  Neuropathy-currently managed by neurosurgery, Dr. Lovenia Hill  Chronic low back pain-managed by Dr. Cyndi Lennert to hear her periodic limb movement has improved on Keppra currently not taking Requip  Hyperlipidemia-continue current medication  Leukocytosis-has been stable, repeat labs today  Vitamin D deficiency-been taking weekly prescription vitamin D.  Labs today.  Localized right  lower quadrant abdominal  pain-unclear etiology.  Possibly related to lumbar spinal disc issue.  Advised we could either have her discuss with Dr. Lovenia Hill next month when she sees him or consider CT pelvis.  She has had a CT pelvis 4 years ago for similar without obvious cause  Labs today  Counseling on vaccines.   Advised she check insurance coverage for Shingrix and updated Tdap.  F/u pending labs.  Robyn Hill was seen today for diabetes.  Diagnoses and all orders for this visit:  Diabetes mellitus with complication ([place]) -     Comprehensive metabolic panel -     Cancel: Hemoglobin A1c -     HM DIABETES EYE EXAM -     HM DIABETES FOOT EXAM -     Microalbumin / creatinine urine ratio -     Cancel: POCT Urinalysis DIP (Proadvantage Device) -     HgB A1c  Essential hypertension, benign -     Comprehensive metabolic panel  Gastroesophageal reflux disease without esophagitis -     Iron  Idiopathic peripheral neuropathy -     Iron  Chronic bilateral low back pain with left-sided sciatica  Arthritis  History of back surgery  History of thyroid disease -     TSH  Hyperlipidemia, unspecified hyperlipidemia type -     Lipid panel  Leukocytosis, unspecified type  Vitamin D deficiency -     VITAMIN D 25 Hydroxy (Vit-D Deficiency, Fractures)  Nocturnal leg movements -     Comprehensive metabolic panel -     CBC with Differential/Platelet -     Iron  Periodic limb movement disorder -     Comprehensive metabolic panel -     CBC with Differential/Platelet -     Iron  Abdominal pain, unspecified abdominal location  Spent > 45 minutes face to face with patient in discussion of symptoms, evaluation, plan and recommendations.

## 2017-12-31 LAB — COMPREHENSIVE METABOLIC PANEL
ALT: 21 IU/L (ref 0–32)
AST: 17 IU/L (ref 0–40)
Albumin/Globulin Ratio: 2.2 (ref 1.2–2.2)
Albumin: 4.8 g/dL (ref 3.5–4.8)
Alkaline Phosphatase: 80 IU/L (ref 39–117)
BUN/Creatinine Ratio: 25 (ref 12–28)
BUN: 18 mg/dL (ref 8–27)
Bilirubin Total: 0.5 mg/dL (ref 0.0–1.2)
CO2: 24 mmol/L (ref 20–29)
Calcium: 10.3 mg/dL (ref 8.7–10.3)
Chloride: 101 mmol/L (ref 96–106)
Creatinine, Ser: 0.73 mg/dL (ref 0.57–1.00)
GFR calc Af Amer: 96 mL/min/{1.73_m2} (ref >59)
GFR calc non Af Amer: 83 mL/min/{1.73_m2} (ref >59)
Globulin, Total: 2.2 g/dL (ref 1.5–4.5)
Glucose: 93 mg/dL (ref 65–99)
Potassium: 5.1 mmol/L (ref 3.5–5.2)
Sodium: 142 mmol/L (ref 134–144)
Total Protein: 7 g/dL (ref 6.0–8.5)

## 2017-12-31 LAB — LIPID PANEL
Chol/HDL Ratio: 2.5 ratio (ref 0.0–4.4)
Cholesterol, Total: 155 mg/dL (ref 100–199)
HDL: 62 mg/dL (ref >39)
LDL Calculated: 71 mg/dL (ref 0–99)
Triglycerides: 112 mg/dL (ref 0–149)
VLDL Cholesterol Cal: 22 mg/dL (ref 5–40)

## 2017-12-31 LAB — CBC WITH DIFFERENTIAL/PLATELET
Basophils Absolute: 0 10*3/uL (ref 0.0–0.2)
Basos: 1 %
EOS (ABSOLUTE): 0.2 10*3/uL (ref 0.0–0.4)
Eos: 3 %
Hematocrit: 39.4 % (ref 34.0–46.6)
Hemoglobin: 12.5 g/dL (ref 11.1–15.9)
Immature Grans (Abs): 0 10*3/uL (ref 0.0–0.1)
Immature Granulocytes: 0 %
Lymphocytes Absolute: 2.8 10*3/uL (ref 0.7–3.1)
Lymphs: 35 %
MCH: 27.8 pg (ref 26.6–33.0)
MCHC: 31.7 g/dL (ref 31.5–35.7)
MCV: 88 fL (ref 79–97)
Monocytes Absolute: 0.7 10*3/uL (ref 0.1–0.9)
Monocytes: 9 %
Neutrophils Absolute: 4.2 10*3/uL (ref 1.4–7.0)
Neutrophils: 52 %
Platelets: 382 10*3/uL — ABNORMAL HIGH (ref 150–379)
RBC: 4.49 x10E6/uL (ref 3.77–5.28)
RDW: 12.9 % (ref 12.3–15.4)
WBC: 8.1 10*3/uL (ref 3.4–10.8)

## 2017-12-31 LAB — IRON: Iron: 134 ug/dL (ref 27–139)

## 2017-12-31 LAB — MICROALBUMIN / CREATININE URINE RATIO
Creatinine, Urine: 126.2 mg/dL
Microalb/Creat Ratio: <2.4 mg/g creat (ref 0.0–30.0)
Microalbumin, Urine: <3 ug/mL

## 2017-12-31 LAB — VITAMIN D 25 HYDROXY (VIT D DEFICIENCY, FRACTURES): Vit D, 25-Hydroxy: 33.5 ng/mL (ref 30.0–100.0)

## 2017-12-31 LAB — TSH: TSH: 1.97 u[IU]/mL (ref 0.450–4.500)

## 2018-01-03 ENCOUNTER — Other Ambulatory Visit: Payer: Self-pay | Admitting: Medical

## 2018-01-03 MED ORDER — VITAMIN D 1000 UNITS PO TABS: 1000.0000 [IU] | ORAL_TABLET | Freq: Every day | ORAL | 3 refills | Status: DC

## 2018-01-03 MED ORDER — FERROUS GLUCONATE 324 (38 FE) MG PO TABS: 324.0000 mg | ORAL_TABLET | Freq: Every day | ORAL | 1 refills | Status: DC

## 2018-01-04 ENCOUNTER — Telehealth: Payer: Self-pay

## 2018-01-04 NOTE — Telephone Encounter (Signed)
LVM, gave lab results. Gave call back number if any questions or concerns. Advised patient to call back to make physical appointment.

## 2018-01-04 NOTE — Telephone Encounter (Signed)
-----   Message from Carlena Hurl, PA-C sent at 01/03/2018  9:30 PM EDT ----- Labs look good.  Continue current medications except change from weekly vitamin D to 1000 IU  Vitamin D daily.  F/u 3- 4 months for physical or recheck

## 2018-01-10 ENCOUNTER — Other Ambulatory Visit: Payer: Self-pay | Admitting: Medical

## 2018-01-11 ENCOUNTER — Telehealth: Payer: Self-pay | Admitting: Medical

## 2018-01-11 NOTE — Telephone Encounter (Signed)
[facility] sent fax in regards to Dexilant 60 mg dr cap with Qyt: 180, 1 capsule twice daily but note from [facility] is max of once daily and they need clarification.  Please f/u

## 2018-01-12 NOTE — Telephone Encounter (Signed)
Yes, change to once daily

## 2018-01-12 NOTE — Telephone Encounter (Signed)
Please advise 

## 2018-01-23 ENCOUNTER — Telehealth: Payer: Self-pay | Admitting: Medical

## 2018-01-23 NOTE — Telephone Encounter (Signed)
P.A. DEXILANT completed with quantity limits exception

## 2018-02-02 NOTE — Telephone Encounter (Signed)
[employer] and approved til 10/18/18 for quantity limits, left message for pt

## 2018-02-16 ENCOUNTER — Telehealth: Payer: Self-pay | Admitting: Cardiology

## 2018-02-16 DIAGNOSIS — M542 Cervicalgia: Secondary | ICD-10-CM | POA: Diagnosis not present

## 2018-02-16 DIAGNOSIS — M961 Postlaminectomy syndrome, not elsewhere classified: Secondary | ICD-10-CM | POA: Diagnosis not present

## 2018-02-16 DIAGNOSIS — M5416 Radiculopathy, lumbar region: Secondary | ICD-10-CM | POA: Diagnosis not present

## 2018-02-16 NOTE — Telephone Encounter (Signed)
Follow Up:; ° ° °Returning your call. °

## 2018-02-17 NOTE — Telephone Encounter (Signed)
See other phone note under husbands records ./cy

## 2018-03-02 ENCOUNTER — Other Ambulatory Visit: Payer: Self-pay | Admitting: Medical

## 2018-03-02 NOTE — Telephone Encounter (Signed)
[facility] is requesting to fill pt cymbalta. Please advise K[facility] 

## 2018-03-17 ENCOUNTER — Other Ambulatory Visit: Payer: Self-pay | Admitting: Medical

## 2018-03-22 DIAGNOSIS — L570 Actinic keratosis: Secondary | ICD-10-CM | POA: Diagnosis not present

## 2018-03-23 DIAGNOSIS — M961 Postlaminectomy syndrome, not elsewhere classified: Secondary | ICD-10-CM | POA: Diagnosis not present

## 2018-03-23 DIAGNOSIS — M542 Cervicalgia: Secondary | ICD-10-CM | POA: Diagnosis not present

## 2018-03-23 DIAGNOSIS — Z6834 Body mass index (BMI) 34.0-34.9, adult: Secondary | ICD-10-CM | POA: Diagnosis not present

## 2018-03-23 DIAGNOSIS — M5414 Radiculopathy, thoracic region: Secondary | ICD-10-CM | POA: Diagnosis not present

## 2018-03-23 DIAGNOSIS — M5416 Radiculopathy, lumbar region: Secondary | ICD-10-CM | POA: Diagnosis not present

## 2018-03-23 DIAGNOSIS — I10 Essential (primary) hypertension: Secondary | ICD-10-CM | POA: Diagnosis not present

## 2018-03-23 DIAGNOSIS — M5412 Radiculopathy, cervical region: Secondary | ICD-10-CM | POA: Diagnosis not present

## 2018-03-28 ENCOUNTER — Other Ambulatory Visit: Payer: Self-pay | Admitting: Medical

## 2018-03-28 ENCOUNTER — Other Ambulatory Visit (HOSPITAL_COMMUNITY): Payer: Self-pay | Admitting: Neurosurgery

## 2018-03-28 DIAGNOSIS — Z1231 Encounter for screening mammogram for malignant neoplasm of breast: Secondary | ICD-10-CM

## 2018-03-28 NOTE — Telephone Encounter (Signed)
Called pt to see if she needs to have a refill on her bydureon. No answer lvm for pt to call back. [place]

## 2018-03-31 ENCOUNTER — Telehealth: Payer: Self-pay | Admitting: Medical

## 2018-03-31 ENCOUNTER — Encounter: Payer: Self-pay | Admitting: Medical

## 2018-03-31 ENCOUNTER — Ambulatory Visit (INDEPENDENT_AMBULATORY_CARE_PROVIDER_SITE_OTHER): Payer: Medicare Other | Admitting: Medical

## 2018-03-31 VITALS — BP 110/70 | HR 67 | Temp 98.2°F | Ht 65.0 in | Wt 186.8 lb

## 2018-03-31 DIAGNOSIS — G609 Hereditary and idiopathic neuropathy, unspecified: Secondary | ICD-10-CM | POA: Diagnosis not present

## 2018-03-31 DIAGNOSIS — I10 Essential (primary) hypertension: Secondary | ICD-10-CM

## 2018-03-31 DIAGNOSIS — N8110 Cystocele, unspecified: Secondary | ICD-10-CM | POA: Diagnosis not present

## 2018-03-31 DIAGNOSIS — G8929 Other chronic pain: Secondary | ICD-10-CM

## 2018-03-31 DIAGNOSIS — Z1211 Encounter for screening for malignant neoplasm of colon: Secondary | ICD-10-CM

## 2018-03-31 DIAGNOSIS — E118 Type 2 diabetes mellitus with unspecified complications: Secondary | ICD-10-CM

## 2018-03-31 DIAGNOSIS — Z1239 Encounter for other screening for malignant neoplasm of breast: Secondary | ICD-10-CM

## 2018-03-31 DIAGNOSIS — M5442 Lumbago with sciatica, left side: Secondary | ICD-10-CM | POA: Diagnosis not present

## 2018-03-31 DIAGNOSIS — M1990 Unspecified osteoarthritis, unspecified site: Secondary | ICD-10-CM

## 2018-03-31 DIAGNOSIS — J0100 Acute maxillary sinusitis, unspecified: Secondary | ICD-10-CM | POA: Insufficient documentation

## 2018-03-31 DIAGNOSIS — M961 Postlaminectomy syndrome, not elsewhere classified: Secondary | ICD-10-CM

## 2018-03-31 DIAGNOSIS — Z0000 Encounter for general adult medical examination without abnormal findings: Secondary | ICD-10-CM

## 2018-03-31 DIAGNOSIS — D638 Anemia in other chronic diseases classified elsewhere: Secondary | ICD-10-CM

## 2018-03-31 DIAGNOSIS — Z8542 Personal history of malignant neoplasm of other parts of uterus: Secondary | ICD-10-CM | POA: Diagnosis not present

## 2018-03-31 DIAGNOSIS — K649 Unspecified hemorrhoids: Secondary | ICD-10-CM

## 2018-03-31 DIAGNOSIS — Z23 Encounter for immunization: Secondary | ICD-10-CM | POA: Diagnosis not present

## 2018-03-31 DIAGNOSIS — Z8639 Personal history of other endocrine, nutritional and metabolic disease: Secondary | ICD-10-CM

## 2018-03-31 DIAGNOSIS — Z9889 Other specified postprocedural states: Secondary | ICD-10-CM

## 2018-03-31 DIAGNOSIS — H409 Unspecified glaucoma: Secondary | ICD-10-CM

## 2018-03-31 DIAGNOSIS — G4761 Periodic limb movement disorder: Secondary | ICD-10-CM

## 2018-03-31 DIAGNOSIS — Z7189 Other specified counseling: Secondary | ICD-10-CM | POA: Insufficient documentation

## 2018-03-31 DIAGNOSIS — E785 Hyperlipidemia, unspecified: Secondary | ICD-10-CM | POA: Diagnosis not present

## 2018-03-31 DIAGNOSIS — K219 Gastro-esophageal reflux disease without esophagitis: Secondary | ICD-10-CM

## 2018-03-31 DIAGNOSIS — R0982 Postnasal drip: Secondary | ICD-10-CM | POA: Insufficient documentation

## 2018-03-31 DIAGNOSIS — E559 Vitamin D deficiency, unspecified: Secondary | ICD-10-CM

## 2018-03-31 DIAGNOSIS — R258 Other abnormal involuntary movements: Secondary | ICD-10-CM

## 2018-03-31 DIAGNOSIS — R5382 Chronic fatigue, unspecified: Secondary | ICD-10-CM

## 2018-03-31 DIAGNOSIS — Z7185 Encounter for immunization safety counseling: Secondary | ICD-10-CM

## 2018-03-31 LAB — POCT GLYCOSYLATED HEMOGLOBIN (HGB A1C): Hemoglobin A1C: 6.1 % — AB (ref 4.0–5.6)

## 2018-03-31 MED ORDER — HYDROCORTISONE 2.5 % RE CREA: 1.0000 "application " | TOPICAL_CREAM | Freq: Two times a day (BID) | RECTAL | 2 refills | Status: DC

## 2018-03-31 MED ORDER — AMOXICILLIN 875 MG PO TABS: 875.0000 mg | ORAL_TABLET | Freq: Two times a day (BID) | ORAL | 0 refills | Status: DC

## 2018-03-31 NOTE — Patient Instructions (Signed)
Thanks for trusting [place] with your health care and for coming in for a physical today.  Below are some general recommendations I have for you:  Yearly screenings See your eye doctor yearly for routine vision care. See your dentist yearly for routine dental care including hygiene visits twice yearly. See me here yearly for a routine physical and preventative care visit   Cancer screening Colon cancer screening:   I recommend you have a Cologuard colon cancer screen.   Let me know if you are agreeable to this  Breast cancer screening -  Continue mammogram every 2 years  . Specific Concerns today:  . Call the medical schools directly to inquire about donation of body to science . Continue your current medications   Please follow up yearly for a physical.    I have included other useful information below for your review.  Preventative Care for Adults - Female      MAINTAIN REGULAR HEALTH EXAMS:  A routine yearly physical is a good way to check in with your primary care provider about your health and preventive screening. It is also an opportunity to share updates about your health and any concerns you have, and receive a thorough all-over exam.   Most health insurance companies pay for at least some preventative services.  Check with your health plan for specific coverages.  WHAT PREVENTATIVE SERVICES DO WOMEN NEED?  Adult women should have their weight and blood pressure checked regularly.   Women age 75 and older should have their cholesterol levels checked regularly.  Women should be screened for cervical cancer with a Pap smear and pelvic exam beginning at either age 73, or 3 years after they become sexually activity.    Breast cancer screening generally begins at age 51 with a mammogram and breast exam by your primary care provider.    Beginning at age 62 and continuing to age 40, women should be screened for colorectal cancer.  Certain people may need continued testing  until age 62.  Updating vaccinations is part of preventative care.  Vaccinations help protect against diseases such as the flu.  Osteoporosis is a disease in which the bones lose minerals and strength as we age. Women ages 10 and over should discuss this with their caregivers, as should women after menopause who have other risk factors.  Lab tests are generally done as part of preventative care to screen for anemia and blood disorders, to screen for problems with the kidneys and liver, to screen for bladder problems, to check blood sugar, and to check your cholesterol level.  Preventative services generally include counseling about diet, exercise, avoiding tobacco, drugs, excessive alcohol consumption, and sexually transmitted infections.    GENERAL RECOMMENDATIONS FOR GOOD HEALTH:  Healthy diet:  Eat a variety of foods, including fruit, vegetables, animal or vegetable protein, such as meat, fish, chicken, and eggs, or beans, lentils, tofu, and grains, such as rice.  Drink plenty of water daily.  Decrease saturated fat in the diet, avoid lots of red meat, processed foods, sweets, fast foods, and fried foods.  Exercise:  Aerobic exercise helps maintain good heart health. At least 30-40 minutes of moderate-intensity exercise is recommended. For example, a brisk walk that increases your heart rate and breathing. This should be done on most days of the week.   Find a type of exercise or a variety of exercises that you enjoy so that it becomes a part of your daily life.  Examples are running, walking, swimming,  water aerobics, and biking.  For motivation and support, explore group exercise such as aerobic class, spin class, Zumba, Yoga,or  martial arts, etc.    Set exercise goals for yourself, such as a certain weight goal, walk or run in a race such as a 5k walk/run.  Speak to your primary care provider about exercise goals.  Disease prevention:  If you smoke or chew tobacco, find out from  your caregiver how to quit. It can literally save your life, no matter how long you have been a tobacco user. If you do not use tobacco, never begin.   Maintain a healthy diet and normal weight. Increased weight leads to problems with blood pressure and diabetes.   The Body Mass Index or BMI is a way of measuring how much of your body is fat. Having a BMI above 27 increases the risk of heart disease, diabetes, hypertension, stroke and other problems related to obesity. Your caregiver can help determine your BMI and based on it develop an exercise and dietary program to help you achieve or maintain this important measurement at a healthful level.  High blood pressure causes heart and blood vessel problems.  Persistent high blood pressure should be treated with medicine if weight loss and exercise do not work.   Fat and cholesterol leaves deposits in your arteries that can block them. This causes heart disease and vessel disease elsewhere in your body.  If your cholesterol is found to be high, or if you have heart disease or certain other medical conditions, then you may need to have your cholesterol monitored frequently and be treated with medication.   Ask if you should have a cardiac stress test if your history suggests this. A stress test is a test done on a treadmill that looks for heart disease. This test can find disease prior to there being a problem.  Menopause can be associated with physical symptoms and risks. Hormone replacement therapy is available to decrease these. You should talk to your caregiver about whether starting or continuing to take hormones is right for you.   Osteoporosis is a disease in which the bones lose minerals and strength as we age. This can result in serious bone fractures. Risk of osteoporosis can be identified using a bone density scan. Women ages 58 and over should discuss this with their caregivers, as should women after menopause who have other risk factors. Ask  your caregiver whether you should be taking a calcium supplement and Vitamin D, to reduce the rate of osteoporosis.   Avoid drinking alcohol in excess (more than two drinks per day).  Avoid use of street drugs. Do not share needles with anyone. Ask for professional help if you need assistance or instructions on stopping the use of alcohol, cigarettes, and/or drugs.  Brush your teeth twice a day with fluoride toothpaste, and floss once a day. Good oral hygiene prevents tooth decay and gum disease. The problems can be painful, unattractive, and can cause other health problems. Visit your dentist for a routine oral and dental check up and preventive care every 6-12 months.   Look at your skin regularly.  Use a mirror to look at your back. Notify your caregivers of changes in moles, especially if there are changes in shapes, colors, a size larger than a pencil eraser, an irregular border, or development of new moles.  Safety:  Use seatbelts 100% of the time, whether driving or as a passenger.  Use safety devices such as hearing protection  if you work in environments with loud noise or significant background noise.  Use safety glasses when doing any work that could send debris in to the eyes.  Use a helmet if you ride a bike or motorcycle.  Use appropriate safety gear for contact sports.  Talk to your caregiver about gun safety.  Use sunscreen with a SPF (or skin protection factor) of 15 or greater.  Lighter skinned people are at a greater risk of skin cancer. Don't forget to also wear sunglasses in order to protect your eyes from too much damaging sunlight. Damaging sunlight can accelerate cataract formation.   Practice safe sex. Use condoms. Condoms are used for birth control and to help reduce the spread of sexually transmitted infections (or STIs).  Some of the STIs are gonorrhea (the clap), chlamydia, syphilis, trichomonas, herpes, HPV (human papilloma virus) and HIV (human immunodeficiency virus)  which causes AIDS. The herpes, HIV and HPV are viral illnesses that have no cure. These can result in disability, cancer and death.   Keep carbon monoxide and smoke detectors in your home functioning at all times. Change the batteries every 6 months or use a model that plugs into the wall.   Vaccinations:  Stay up to date with your tetanus shots and other required immunizations. You should have a booster for tetanus every 10 years. Be sure to get your flu shot every year, since 5%-20% of the [place] population comes down with the flu. The flu vaccine changes each year, so being vaccinated once is not enough. Get your shot in the fall, before the flu season peaks.   Other vaccines to consider:  Human Papilloma Virus or HPV causes cancer of the cervix, and other infections that can be transmitted from person to person. There is a vaccine for HPV, and females should get immunized between the ages of 50 and 109. It requires a series of 3 shots.   Pneumococcal vaccine to protect against certain types of pneumonia.  This is normally recommended for adults age 95 or older.  However, adults younger than 72 years old with certain underlying conditions such as diabetes, heart or lung disease should also receive the vaccine.  Shingles vaccine to protect against Varicella Zoster if you are older than age 31, or younger than 72 years old with certain underlying illness.  If you have not had the Shingrix vaccine, please call your insurer to inquire about coverage for the Shingrix vaccine given in 2 doses.   Some insurers cover this vaccine after age 30, some cover this after age 36.  If your insurer covers this, then call to schedule appointment to have this vaccine here  Hepatitis A vaccine to protect against a form of infection of the liver by a virus acquired from food.  Hepatitis B vaccine to protect against a form of infection of the liver by a virus acquired from blood or body fluids, particularly if you work  in health care.  If you plan to travel internationally, check with your local health department for specific vaccination recommendations.  Cancer Screening:  Breast cancer screening is essential to preventive care for women. All women age 7 and older should perform a breast self-exam every month. At age 59 and older, women should have their caregiver complete a breast exam each year. Women at ages 17 and older should have a mammogram (x-ray film) of the breasts. Your caregiver can discuss how often you need mammograms.    Cervical cancer screening includes taking a  Pap smear (sample of cells examined under a microscope) from the cervix (end of the uterus). It also includes testing for HPV (Human Papilloma Virus, which can cause cervical cancer). Screening and a pelvic exam should begin at age 78, or 3 years after a woman becomes sexually active. Screening should occur every year, with a Pap smear but no HPV testing, up to age 15. After age 29, you should have a Pap smear every 3 years with HPV testing, if no HPV was found previously.   Most routine colon cancer screening begins at the age of 6. On a yearly basis, doctors may provide special easy to use take-home tests to check for hidden blood in the stool. Sigmoidoscopy or colonoscopy can detect the earliest forms of colon cancer and is life saving. These tests use a small camera at the end of a tube to directly examine the colon. Speak to your caregiver about this at age 40, when routine screening begins (and is repeated every 5 years unless early forms of pre-cancerous polyps or small growths are found).

## 2018-03-31 NOTE — Progress Notes (Signed)
Subjective:    Robyn Hill is a 72 y.o. female who presents for Preventative Services visit and chronic medical problems/med check visit.    Primary Care Provider Tysinger, Camelia Eng, PA-C here for primary care  Current Health Care Team:  Dentist, Dr. Joya Gaskins   Eye doctor, Dr. Delman Cheadle   Dr. Arley Phenix, cardiology  Medical Services you may have received from other than [facility] providers in the past year (date may be approximate) DR.Beavers-Dermatologist  Exercise Current exercise habits: The patient does not participate in regular exercise at present.   Nutrition/Diet Current diet: in general, a "healthy" diet  , well balanced  Depression Screen Depression screen [facility] 2/9 03/31/2018  Decreased Interest 0  Down, Depressed, Hopeless 0  PHQ - 2 Score 0  Some recent data might be hidden    Activities of Daily Living Screen/Functional Status Survey Is the patient deaf or have difficulty hearing?: No Does the patient have difficulty seeing, even when wearing glasses/contacts?: Yes(blind in her right eye ) Does the patient have difficulty concentrating, remembering, or making decisions?: Yes(problems remembering sometimes ) Does the patient have difficulty walking or climbing stairs?: Yes(back problems/previous surgeries ) Does the patient have difficulty dressing or bathing?: No Does the patient have difficulty doing errands alone such as visiting a doctor's office or shopping?: Yes(can't drive anymore )  Can patient draw a clock face showing 3:15 o'clock, yes  Fall Risk Screen Fall Risk  03/31/2018 09/23/2017 02/19/2016 06/27/2014  Falls in the past year? No Yes No No  Comment - Emmi Telephone Survey: data to providers prior to load - -  Number falls in past yr: - 2 or more - -  Comment - Emmi Telephone Survey Actual Response = 2 - -  Injury with Fall? - No - -    Gait Assessment: Normal gait observed yes  Advanced directives Does patient have a [place]?  Yes Does patient have a Living Will? Yes  Past Medical History:  Diagnosis Date  . Adrenal mass, right ([place])   . Allergy   . Anemia   . Asthma    "as a teenager"  . Blind right eye    since childhood  . Blood transfusion 1970  . Chronic back pain   . Chronic diarrhea    Colestipol therapy  . Depression   . Diverticulitis   . Diverticulosis   . Fatty liver   . Female bladder prolapse   . GERD (gastroesophageal reflux disease)   . Glaucoma   . H/O bone density study 11/29/2014   normal study although mild decreased in density from prior study  . H/O hiatal hernia   . Hemorrhoids   . History of kidney stones   . History of uterine cancer    s/p hysterectomy  . Hyperlipidemia   . Hypertension   . Migraine    hx of migraines in past   . Neuromuscular disorder ([place])    diabetic neuropahthy  . Numbness and tingling of both legs   . Osteoarthritis   . Rheumatic fever    age 73  . Type II diabetes mellitus ([place])   . Urinary incontinence   . Vitamin D deficiency     Past Surgical History:  Procedure Laterality Date  . ABDOMINAL HYSTERECTOMY  1980   no further pap smears needed  . ANTERIOR AND POSTERIOR REPAIR N/A 01/29/2015   Procedure: CYSTOCELE AND RECTOCELE ;  Surgeon: Bjorn Loser, MD;  Location: [facility];  Service:  Urology;  Laterality: N/A;  . BACK SURGERY    . BREAST BIOPSY  08/1997   right  . CARDIAC CATHETERIZATION  11/2006  . CHOLECYSTECTOMY    . COLONOSCOPY  2014   polyps, external hemorrhoids  . CYSTOSCOPY N/A 01/29/2015   Procedure: CYSTOSCOPY;  Surgeon: Bjorn Loser, MD;  Location: [facility];  Service: Urology;  Laterality: N/A;  . DILATION AND CURETTAGE OF UTERUS  1980  . EDSI for back pain  last 01/2009   multiple x 5  . EYE SURGERY     Bil Blind R eye  . [place]yx  . LIVER BIOPSY  04/1999  . minor laceration repair     right eye  . POSTERIOR FUSION LUMBAR SPINE  04/05/12  . PULSE GENERATOR IMPLANT Right 03/12/2016   Procedure:  Right Reposition of implantable pulse generator;  Surgeon: Erline Levine, MD;  Location: [place] [facility];  Service: Neurosurgery;  Laterality: Right;  Right Reposition of implantable pulse generator  . SPINAL CORD STIMULATOR INSERTION N/A 09/06/2015   Procedure: LUMBAR SPINAL CORD STIMULATOR INSERTION;  Surgeon: Clydell Hakim, MD;  Location: [place] [facility];  Service: Neurosurgery;  Laterality: N/A;  Spinal Cord Stimulator placement  . SPINAL CORD STIMULATOR INSERTION N/A 10/22/2015   Procedure: Laminectomy for spinal cord stimulator paddle lead and implantable generator placement;  Surgeon: Erline Levine, MD;  Location: [place] [facility];  Service: Neurosurgery;  Laterality: N/A;  Laminectomy for spinal cord stimulator paddle lead and implantable generator placement  . steroid injections of hip  last 02/2009   multiple x 4  . TONSILLECTOMY AND ADENOIDECTOMY  1952  . TRABECULECTOMY  1996   bilaterally; "for glaucoma"  . UPPER GI ENDOSCOPY  05/21/14   food, retained in the body of the stomach  . VAGINAL PROLAPSE REPAIR N/A 01/29/2015   Procedure:  VAULT PROLAPSE WITH GRAFT ;  Surgeon: Bjorn Loser, MD;  [facility];  Service: Urology;  Laterality: N/A;    Social History   Socioeconomic History  . Marital status: Married    Spouse name: Not on file  . Number of children: 3  . Years of education: Not on file  . Highest education level: Not on file  Occupational History  . Not on file  Social Needs  . Financial resource strain: Not on file  . Food insecurity:    Worry: Not on file    Inability: Not on file  . Transportation needs:    Medical: Not on file    Non-medical: Not on file  Tobacco Use  . Smoking status: Never Smoker  . Smokeless tobacco: Never Used  Substance and Sexual Activity  . Alcohol use: Yes    Comment: rare  . Drug use: No  . Sexual activity: Never    Comment: married  Lifestyle  . Physical activity:    Days per week: Not on file    Minutes per session: Not on file    . Stress: Not on file  Relationships  . Social connections:    Talks on phone: Not on file    Gets together: Not on file    Attends religious service: Not on file    Active member of club or organization: Not on file    Attends meetings of clubs or organizations: Not on file    Relationship status: Not on file  . Intimate partner violence:    Fear of current or ex partner: Not on file    Emotionally abused: Not  on file    Physically abused: Not on file    Forced sexual activity: Not on file  Other Topics Concern  . Not on file  Social History Narrative   Three children and two step.      Family History  Problem Relation Age of Onset  . Heart attack Brother 42  . Hypertension Other   . Diabetes Other   . Cancer Mother 42  . Aplastic anemia Mother 69  . Heart attack Brother 29  . Colon cancer Brother   . Heart attack Sister   . Heart attack Brother 42  . Anesthesia problems Neg Hx      Current Outpatient Medications:  .  ACCU-CHEK SOFTCLIX LANCETS lancets, TEST DAILY, Disp: 100 each, Rfl: 5 .  atorvastatin (LIPITOR) 40 MG tablet, TAKE 1 TABLET BY MOUTH ONCE DAILY AT BEDTIME, Disp: 90 tablet, Rfl: 1 .  B-D UF III MINI PEN NEEDLES 31G X 5 MM MISC, USE TO INJECT TOUJEO, Disp: 100 each, Rfl: 3 .  Blood Glucose Calibration (ACCU-CHEK INSTANT CONTROL) LIQD, Use for controls for accu-chek activa meter, Disp: 1 each, Rfl: 0 .  brimonidine (ALPHAGAN) 0.2 % ophthalmic solution, Place 1 drop into the left eye 2 times daily., Disp: , Rfl:  .  BYDUREON BCISE 2 MG/0.85ML AUIJ, INJECT CONTENTS OF ONE SYRINGE SUB-Q ONCE A WEEK, Disp: 4 pen, Rfl: 2 .  cholecalciferol (VITAMIN D) 1000 units tablet, Take 1 tablet (1,000 Units total) by mouth daily., Disp: 90 tablet, Rfl: 3 .  DEXILANT 60 MG capsule, TAKE 1 CAPSULE BY MOUTH TWICE DAILY, Disp: 180 capsule, Rfl: 1 .  dorzolamide-timolol (COSOPT) 22.3-6.8 MG/ML ophthalmic solution, Place 1 drop into both eyes 2 (two) times daily., Disp: , Rfl:   .  DULoxetine (CYMBALTA) 60 MG capsule, TAKE 1 CAPSULE BY MOUTH ONCE DAILY, Disp: 90 capsule, Rfl: 0 .  enalapril (VASOTEC) 20 MG tablet, TAKE 1 TABLET BY MOUTH ONCE DAILY, Disp: 90 tablet, Rfl: 0 .  ferrous gluconate (FERGON) 324 MG tablet, Take 1 tablet (324 mg total) by mouth daily with breakfast., Disp: 90 tablet, Rfl: 1 .  glucose blood (ACCU-CHEK AVIVA PLUS) test strip, Test BID. Dx Code- E11.8, Disp: 300 each, Rfl: 1 .  hydrochlorothiazide (HYDRODIURIL) 25 MG tablet, TAKE 1 TABLET BY MOUTH ONCE DAILY, Disp: 90 tablet, Rfl: 3 .  HYDROcodone-acetaminophen (NORCO) 10-325 MG tablet, 1 tablet every 6 (six) hours., Disp: , Rfl:  .  Insulin Pen Needle (PEN NEEDLES) 31G X 6 MM MISC, Use these for Toujeo pen., Disp: 200 each, Rfl: 11 .  Lancets (ACCU-CHEK SOFT TOUCH) lancets, Test 2-3 times a day, Disp: 100 each, Rfl: 5 .  latanoprost (XALATAN) 0.005 % ophthalmic solution, Place 1 drop into the right eye at bedtime., Disp: , Rfl:  .  levETIRAcetam (KEPPRA) 250 MG tablet, Take 250 mg by mouth daily., Disp: , Rfl:  .  metFORMIN (GLUCOPHAGE) 1000 MG tablet, TAKE 1 TABLET BY MOUTH TWICE DAILY WITH A MEAL, Disp: 180 tablet, Rfl: 0 .  TOUJEO SOLOSTAR 300 UNIT/ML SOPN, INJECT 12 UNITS SUBCUTANEOUSLY AT BEDTIME, Disp: 4 pen, Rfl: 1 .  triamcinolone cream (KENALOG) 0.1 %, Apply 1 application topically as needed (for skin irritation). Reported on 04/24/2016, Disp: , Rfl:  .  amoxicillin (AMOXIL) 875 MG tablet, Take 1 tablet (875 mg total) by mouth 2 (two) times daily., Disp: 20 tablet, Rfl: 0 .  hydrocortisone (PROCTOSOL HC) 2.5 % rectal cream, Place 1 application rectally 2 (two) times daily., Disp:  30 g, Rfl: 2  Allergies  Allergen Reactions  . Kiwi Extract Hives  . Lyrica [Pregabalin] Other (See Comments)    psychosis  . Sunflowerseed Oil Hives  . Hydrocodone-Acetaminophen Nausea Only    "Extended release form only"  . Invokana [Canagliflozin] Other (See Comments)    UTI  . Neurontin [Gabapentin]  Other (See Comments)    Intolerance/ineffective  . Nitrofuran Derivatives Nausea Only and Other (See Comments)    stomach pain and weight loss  . Tape Other (See Comments)    Pulls skin off, can use paper tape    History reviewed: allergies, current medications, past family history, past medical history, past social history, past surgical history and problem list  Chronic issues discussed: Compliant with medication for diabetes, glucose readings ok.  No new foot concerns.   compliant with medication for HTN  Compliant with medication for cholesterol  Acute issues discussed: Still having a lot of problems with hemorrhoid irritation.  Having ongoing problems with bleeding hemorrhoids.   several hanging out. Uses preparation H  A month or so ago, had terrible sore throat, way in back, had white spots.  This progressed into mouth and around gums.  Was coughing up green phlegm.  This has improved, but still getting some sore throat at night. Thinks currently she is having post nasal drip.   Coughs and spits up all night long.  She is using OTC allergy pill   Objective:      Biometrics BP 110/70   Pulse 67   Temp 98.2 F (36.8 C) (Oral)   Ht 5\' 5"  (1.651 m)   Wt 186 lb 12.8 oz (84.7 kg)   SpO2 97%   BMI 31.09 kg/m   Gen: wd, wn, nad, white female Skin: scattered macules, several erytehmatous rough patches, 4-8cm diameters on palms, right upper chest, nonspecific and s/p recent biopsies from dermatology HEENT: normocephalic, sclerae anicteric, TMs pearly, nares patent, no discharge or erythema, pharynx normal Oral cavity: MMM, no lesions Neck: supple, no lymphadenopathy, no thyromegaly, no masses, no bruits Heart: RRR, normal S1, S2, no murmurs Lungs: CTA bilaterally, no wheezes, rhonchi, or rales Abdomen: +bs, soft, non tender, non distended, no masses, no hepatomegaly, no splenomegaly Musculoskeletal: lumbar surgical scars, right lower back horizontal linear surgical scars,  tender lumbar region in general, bony arthritis changes of hands, no swelling, no obvious deformity Extremities: no edema, no cyanosis, no clubbing Pulses: 1+ symmetric, upper and lower extremities, normal cap refill Neurological: alert, oriented x 3, CN2-12 intact, strength normal upper extremities and lower extremities,  DTRs 1+ throughout, no cerebellar signs, gait normal Psychiatric: normal affect, behavior normal, pleasant  Breast: no lump or lesions, normal exam, chaperoned by nurse Rectal: several small external hemorrhoid, 1 friable swollen irritated medium to large hemorrhoid, non thrombosed, exam chaperoned by nurse  Diabetic Foot Exam - Simple   Simple Foot Form Diabetic Foot exam was performed with the following findings:  Yes 03/31/2018 10:40 AM  Visual Inspection No deformities, no ulcerations, no other skin breakdown bilaterally:  Yes Sensation Testing See comments:  Yes Pulse Check See comments:  Yes Comments 1+ pedal pulses, unable to detect monofilament throughout, no obvious callus or ulceration      Assessment:   Encounter Diagnoses  Name Primary?  . Essential hypertension, benign Yes  . Medicare annual wellness visit, subsequent   . Gastroesophageal reflux disease without esophagitis   . Diabetes mellitus with complication ([place])   . Idiopathic peripheral neuropathy   . Arthritis   .  Prolapse of female bladder, acquired   . Hyperlipidemia, unspecified hyperlipidemia type   . Chronic bilateral low back pain with left-sided sciatica   . Glaucoma, unspecified glaucoma type, unspecified laterality   . History of uterine cancer   . History of thyroid disease   . Other chronic pain   . Anemia of chronic disease   . Periodic limb movement disorder   . Post laminectomy syndrome   . Chronic fatigue   . Nocturnal leg movements   . Vitamin D deficiency   . History of back surgery   . Vaccine counseling   . Screening for breast cancer   . Screen for colon  cancer   . Hemorrhoids, unspecified hemorrhoid type   . Acute non-recurrent maxillary sinusitis   . Post-nasal drainage   . Need for pneumococcal vaccination      Plan:   A preventative services visit was completed today.  During the course of the visit today, we discussed and counseled about appropriate screening and preventive services.  A health risk assessment was established today that included a review of current medications, allergies, social history, family history, medical and preventative health history, biometrics, and preventative screenings to identify potential safety concerns or impairments.  A personalized plan was printed today for your records and use.   Personalized health advice and education was given today to reduce health risks and promote self management and wellness.  Information regarding end of life planning was discussed today.  Conditions/risks identified: Vision, arthritis, pain  Chronic problems discussed today: Diabetes - c/t routine eye care, foot checks daily, glucose monitoring.   HgbA1C 6.1 % today.  C/t same medication HTN - c/t same medication Hyperlipidemia - c/t same medication Chronic back pain and Arthritis - pain managed by neurosurgery Vit D deficiency - c/t supplementation   Acute problems discussed today:  Post nasal drainage, sinusitis -begin Amoxicillin, rest, hydrate well, can c/t OTC allergy medication another week  Hemorrhoids - advised SITZ baths, proctosol cream for flare up, and referral to specialist for hemorrhoid procedure   Recommendations:  I recommend a yearly ophthalmology/optometry visit for glaucoma screening and eye checkup  I recommended a yearly dental visit for hygiene and checkup  Advanced directives - discussed nature and purpose of Advanced Directives, encouraged them to complete them if they have not done so and/or encouraged them to get [place] a copy if they have done this already.  Mammogram every 2  years   Referrals today: Consider Cologuard referral  Immunizations: I recommended a yearly influenza vaccine, typically in September when the vaccine is usually available Counseled on updating Td vaccine.  She will consider Counseled on the pneumococcal vaccine.  Vaccine information sheet given.  Pneumococcal vaccine PPSV23 given after consent obtained. Up to date on Shingrix  Syesha was seen today for medicare wellness.  Diagnoses and all orders for this visit:  Essential hypertension, benign  Medicare annual wellness visit, subsequent  Gastroesophageal reflux disease without esophagitis  Diabetes mellitus with complication (HCC)  Idiopathic peripheral neuropathy  Arthritis  Prolapse of female bladder, acquired  Hyperlipidemia, unspecified hyperlipidemia type  Chronic bilateral low back pain with left-sided sciatica  Glaucoma, unspecified glaucoma type, unspecified laterality  History of uterine cancer  History of thyroid disease  Other chronic pain  Anemia of chronic disease  Periodic limb movement disorder  Post laminectomy syndrome  Chronic fatigue  Nocturnal leg movements  Vitamin D deficiency  History of back surgery  Vaccine counseling  Screening for breast cancer -  MM DIGITAL SCREENING BILATERAL; Future  Screen for colon cancer  Hemorrhoids, unspecified hemorrhoid type  Acute non-recurrent maxillary sinusitis  Post-nasal drainage  Need for pneumococcal vaccination  Other orders -     amoxicillin (AMOXIL) 875 MG tablet; Take 1 tablet (875 mg total) by mouth 2 (two) times daily. -     hydrocortisone (PROCTOSOL HC) 2.5 % rectal cream; Place 1 application rectally 2 (two) times daily.   Medicare Attestation A preventative services visit was completed today.  During the course of the visit the patient was educated and counseled about appropriate screening and preventive services.  A health risk assessment was established with the  patient that included a review of current medications, allergies, social history, family history, medical and preventative health history, biometrics, and preventative screenings to identify potential safety concerns or impairments.  A personalized plan was printed today for the patient's records and use.   Personalized health advice and education was given today to reduce health risks and promote self management and wellness.  Information regarding end of life planning was discussed today.  Dorothea Ogle, PA-C   03/31/2018

## 2018-03-31 NOTE — Telephone Encounter (Signed)
Refer to either general surgery or GI for procedure for multiple recurrent external hemorrhoids.  Try calling the office of her last colonoscopy first to see if they do procedure for this.  Also call or send order for mammogram to [facility] in [place], [place] for 07/2018

## 2018-03-31 NOTE — Telephone Encounter (Signed)
Yes call in order for mammogram as I am her PCP.  Not sure who else ordered it.

## 2018-03-31 NOTE — Telephone Encounter (Signed)
Pt has been referred to GI. It appears an order was already placed for mammogram with another provider. Do you wish to proceed?

## 2018-03-31 NOTE — Addendum Note (Signed)
Addended by: Edgar Frisk on: 03/31/2018 11:08 AM   Modules accepted: Orders

## 2018-04-01 NOTE — Telephone Encounter (Signed)
I have scheduled patient to have her mammogram done on 04/19/18 at [facility] as she has had it done there previously. Pt is made aware of the appt

## 2018-04-06 ENCOUNTER — Ambulatory Visit (HOSPITAL_COMMUNITY): Payer: Medicare Other

## 2018-04-07 DIAGNOSIS — D171 Benign lipomatous neoplasm of skin and subcutaneous tissue of trunk: Secondary | ICD-10-CM | POA: Diagnosis not present

## 2018-04-07 DIAGNOSIS — D485 Neoplasm of uncertain behavior of skin: Secondary | ICD-10-CM | POA: Diagnosis not present

## 2018-04-08 ENCOUNTER — Other Ambulatory Visit: Payer: Self-pay | Admitting: Neurosurgery

## 2018-04-08 DIAGNOSIS — M961 Postlaminectomy syndrome, not elsewhere classified: Secondary | ICD-10-CM

## 2018-04-08 DIAGNOSIS — M5412 Radiculopathy, cervical region: Secondary | ICD-10-CM

## 2018-04-08 DIAGNOSIS — M5414 Radiculopathy, thoracic region: Secondary | ICD-10-CM

## 2018-04-11 ENCOUNTER — Telehealth: Payer: Self-pay | Admitting: Medical

## 2018-04-11 NOTE — Telephone Encounter (Signed)
Spoke to patient and she did not tell you that she has problems with gas an diarrhea and she is still wanting to go to GI she states that [facility] had called her but she wasn't sure for what.  I told her that I would ask you for the referral and call her back.

## 2018-04-11 NOTE — Telephone Encounter (Signed)
Left message on voicemail for patient to call back. 

## 2018-04-11 NOTE — Telephone Encounter (Signed)
Patient notified and she will call [facility] tomorrow to make appointment.

## 2018-04-11 NOTE — Telephone Encounter (Signed)
Per her recent well visit, I had mentioned that for next colon cancer screen, she could consider cologuard test if no rectal bleeding or no bowel changes, particularly if last colonoscopy was normal.  We generally don't routine keep doing colonoscopies over age 72 for screening.  I didn't put a referral in for gastroenterology/GI.   Did she get a call from GI?  I think we were trying to hunt down a copy of the last colonoscopy and pathology report as we didn't have all of these documents, but I am not aware we referred her to gastro.

## 2018-04-11 NOTE — Telephone Encounter (Signed)
  Patient called about her mammogram She had last mammogram 08/18/16 at [facility] in [place] They have previously sent [place] a report but I have sent a request to have it sent again   Patient also has questions about GI referral and Colonoscopy vs cologard, pt prefers colonoscopy And not exactly sure what GI referral is for   Please call

## 2018-04-11 NOTE — Telephone Encounter (Signed)
Not sure how [facility] got word of referral, but nonetheless, she can go see them for this issue if needed, so that referral can continue.

## 2018-04-12 ENCOUNTER — Encounter: Payer: Self-pay | Admitting: Gastroenterology

## 2018-04-19 ENCOUNTER — Other Ambulatory Visit: Payer: Self-pay | Admitting: Medical

## 2018-04-20 DIAGNOSIS — Z4802 Encounter for removal of sutures: Secondary | ICD-10-CM | POA: Diagnosis not present

## 2018-04-27 ENCOUNTER — Telehealth: Payer: Self-pay | Admitting: Nurse Practitioner

## 2018-04-27 ENCOUNTER — Telehealth: Payer: Self-pay | Admitting: Medical

## 2018-04-27 NOTE — Telephone Encounter (Signed)
Received requested mammogram from [facility]. Sending back for review.

## 2018-04-27 NOTE — Telephone Encounter (Signed)
Phone call to patient to verify medication list and allergies for myelogram procedure. Pt instructed to stop cymbalta 48hrs prior to myelogram appointment time. Pt verbalized understanding. 

## 2018-05-03 ENCOUNTER — Ambulatory Visit (HOSPITAL_COMMUNITY): Payer: Medicare Other

## 2018-05-03 ENCOUNTER — Ambulatory Visit (HOSPITAL_COMMUNITY): Admission: RE | Admit: 2018-05-03 | Payer: Medicare Other | Source: Ambulatory Visit

## 2018-05-10 ENCOUNTER — Ambulatory Visit
Admission: RE | Admit: 2018-05-10 | Discharge: 2018-05-10 | Disposition: A | Discharge status: Home / Self Care | Payer: Medicare Other | Source: Ambulatory Visit | Attending: Neurosurgery | Admitting: Neurosurgery

## 2018-05-10 VITALS — BP 144/64 | HR 55

## 2018-05-10 DIAGNOSIS — M5414 Radiculopathy, thoracic region: Secondary | ICD-10-CM

## 2018-05-10 DIAGNOSIS — M5412 Radiculopathy, cervical region: Secondary | ICD-10-CM

## 2018-05-10 DIAGNOSIS — M961 Postlaminectomy syndrome, not elsewhere classified: Secondary | ICD-10-CM

## 2018-05-10 DIAGNOSIS — M4316 Spondylolisthesis, lumbar region: Secondary | ICD-10-CM

## 2018-05-10 MED ORDER — DIAZEPAM 5 MG PO TABS
5.0000 mg | ORAL_TABLET | Freq: Once | ORAL | Status: AC
  Administered 2018-05-10: 5 mg via ORAL

## 2018-05-10 MED ORDER — MEPERIDINE HCL 100 MG/ML IJ SOLN
75.0000 mg | Freq: Once | INTRAMUSCULAR | Status: AC
  Administered 2018-05-10: 75 mg via INTRAMUSCULAR

## 2018-05-10 MED ORDER — IOPAMIDOL (ISOVUE-M 300) INJECTION 61%
10.0000 mL | Freq: Once | INTRAMUSCULAR | Status: AC | PRN
  Administered 2018-05-10: 10 mL via INTRATHECAL

## 2018-05-10 MED ORDER — ONDANSETRON HCL 4 MG/2ML IJ SOLN
4.0000 mg | Freq: Once | INTRAMUSCULAR | Status: AC
  Administered 2018-05-10: 4 mg via INTRAMUSCULAR

## 2018-05-10 NOTE — Discharge Instructions (Signed)

## 2018-05-24 DIAGNOSIS — M961 Postlaminectomy syndrome, not elsewhere classified: Secondary | ICD-10-CM | POA: Diagnosis not present

## 2018-05-25 DIAGNOSIS — I10 Essential (primary) hypertension: Secondary | ICD-10-CM | POA: Diagnosis not present

## 2018-05-25 DIAGNOSIS — Z6834 Body mass index (BMI) 34.0-34.9, adult: Secondary | ICD-10-CM | POA: Diagnosis not present

## 2018-05-25 DIAGNOSIS — M5021 Other cervical disc displacement,  high cervical region: Secondary | ICD-10-CM | POA: Diagnosis not present

## 2018-05-25 DIAGNOSIS — M961 Postlaminectomy syndrome, not elsewhere classified: Secondary | ICD-10-CM | POA: Diagnosis not present

## 2018-05-25 DIAGNOSIS — M5412 Radiculopathy, cervical region: Secondary | ICD-10-CM | POA: Diagnosis not present

## 2018-05-25 DIAGNOSIS — M542 Cervicalgia: Secondary | ICD-10-CM | POA: Diagnosis not present

## 2018-06-01 DIAGNOSIS — E119 Type 2 diabetes mellitus without complications: Secondary | ICD-10-CM | POA: Diagnosis not present

## 2018-06-01 DIAGNOSIS — H401133 Primary open-angle glaucoma, bilateral, severe stage: Secondary | ICD-10-CM | POA: Diagnosis not present

## 2018-06-01 LAB — HM DIABETES EYE EXAM

## 2018-06-15 ENCOUNTER — Other Ambulatory Visit: Payer: Self-pay | Admitting: Medical

## 2018-06-15 ENCOUNTER — Ambulatory Visit: Payer: Medicare Other | Admitting: Gastroenterology

## 2018-06-15 ENCOUNTER — Telehealth: Payer: Self-pay | Admitting: Medical

## 2018-06-15 MED ORDER — INSULIN DEGLUDEC 100 UNIT/ML ~~LOC~~ SOPN: 12.0000 [IU] | PEN_INJECTOR | Freq: Every day | SUBCUTANEOUS | 2 refills | Status: DC

## 2018-06-15 NOTE — Telephone Encounter (Signed)
Insurer is requiring a change from [place] to [place].  They are both long acting insulin.  I sent Mal Amabile the alternate covered medication to pharmacy

## 2018-06-15 NOTE — Telephone Encounter (Signed)
Faxed back.

## 2018-06-19 ENCOUNTER — Other Ambulatory Visit: Payer: Self-pay | Admitting: Medical

## 2018-07-04 ENCOUNTER — Other Ambulatory Visit: Payer: Self-pay | Admitting: Medical

## 2018-07-04 NOTE — Telephone Encounter (Signed)
Is this ok to refill?  

## 2018-07-18 DIAGNOSIS — D126 Benign neoplasm of colon, unspecified: Secondary | ICD-10-CM | POA: Diagnosis not present

## 2018-07-19 ENCOUNTER — Other Ambulatory Visit: Payer: Self-pay | Admitting: Medical

## 2018-07-26 DIAGNOSIS — Z1231 Encounter for screening mammogram for malignant neoplasm of breast: Secondary | ICD-10-CM | POA: Diagnosis not present

## 2018-07-27 LAB — HM MAMMOGRAPHY

## 2018-08-03 ENCOUNTER — Ambulatory Visit: Payer: Medicare Other | Admitting: Medical

## 2018-08-05 ENCOUNTER — Other Ambulatory Visit: Payer: Self-pay | Admitting: Medical

## 2018-08-05 ENCOUNTER — Encounter: Payer: Self-pay | Admitting: Medical

## 2018-08-05 ENCOUNTER — Ambulatory Visit (INDEPENDENT_AMBULATORY_CARE_PROVIDER_SITE_OTHER): Payer: Medicare Other | Admitting: Medical

## 2018-08-05 ENCOUNTER — Ambulatory Visit (INDEPENDENT_AMBULATORY_CARE_PROVIDER_SITE_OTHER): Payer: Medicare Other | Admitting: Cardiology

## 2018-08-05 ENCOUNTER — Telehealth: Payer: Self-pay | Admitting: Cardiology

## 2018-08-05 ENCOUNTER — Encounter: Payer: Self-pay | Admitting: Cardiology

## 2018-08-05 VITALS — BP 134/68 | HR 88 | Ht 65.0 in | Wt 196.6 lb

## 2018-08-05 VITALS — BP 130/84 | HR 63 | Temp 98.0°F | Resp 16 | Ht 65.0 in | Wt 196.6 lb

## 2018-08-05 DIAGNOSIS — R258 Other abnormal involuntary movements: Secondary | ICD-10-CM

## 2018-08-05 DIAGNOSIS — I499 Cardiac arrhythmia, unspecified: Secondary | ICD-10-CM | POA: Diagnosis not present

## 2018-08-05 DIAGNOSIS — E785 Hyperlipidemia, unspecified: Secondary | ICD-10-CM

## 2018-08-05 DIAGNOSIS — I10 Essential (primary) hypertension: Secondary | ICD-10-CM | POA: Diagnosis not present

## 2018-08-05 DIAGNOSIS — Z23 Encounter for immunization: Secondary | ICD-10-CM

## 2018-08-05 DIAGNOSIS — E118 Type 2 diabetes mellitus with unspecified complications: Secondary | ICD-10-CM

## 2018-08-05 DIAGNOSIS — R0989 Other specified symptoms and signs involving the circulatory and respiratory systems: Secondary | ICD-10-CM | POA: Diagnosis not present

## 2018-08-05 DIAGNOSIS — I4891 Unspecified atrial fibrillation: Secondary | ICD-10-CM

## 2018-08-05 DIAGNOSIS — R9431 Abnormal electrocardiogram [ECG] [EKG]: Secondary | ICD-10-CM | POA: Diagnosis not present

## 2018-08-05 DIAGNOSIS — M1990 Unspecified osteoarthritis, unspecified site: Secondary | ICD-10-CM | POA: Diagnosis not present

## 2018-08-05 DIAGNOSIS — G609 Hereditary and idiopathic neuropathy, unspecified: Secondary | ICD-10-CM | POA: Diagnosis not present

## 2018-08-05 DIAGNOSIS — I480 Paroxysmal atrial fibrillation: Secondary | ICD-10-CM | POA: Insufficient documentation

## 2018-08-05 DIAGNOSIS — K5903 Drug induced constipation: Secondary | ICD-10-CM

## 2018-08-05 DIAGNOSIS — H409 Unspecified glaucoma: Secondary | ICD-10-CM

## 2018-08-05 DIAGNOSIS — G8929 Other chronic pain: Secondary | ICD-10-CM | POA: Diagnosis not present

## 2018-08-05 DIAGNOSIS — Z1159 Encounter for screening for other viral diseases: Secondary | ICD-10-CM | POA: Diagnosis not present

## 2018-08-05 DIAGNOSIS — G4761 Periodic limb movement disorder: Secondary | ICD-10-CM | POA: Diagnosis not present

## 2018-08-05 DIAGNOSIS — D638 Anemia in other chronic diseases classified elsewhere: Secondary | ICD-10-CM

## 2018-08-05 DIAGNOSIS — K649 Unspecified hemorrhoids: Secondary | ICD-10-CM

## 2018-08-05 DIAGNOSIS — Z9889 Other specified postprocedural states: Secondary | ICD-10-CM

## 2018-08-05 DIAGNOSIS — M5442 Lumbago with sciatica, left side: Secondary | ICD-10-CM

## 2018-08-05 DIAGNOSIS — M4316 Spondylolisthesis, lumbar region: Secondary | ICD-10-CM

## 2018-08-05 DIAGNOSIS — T402X5A Adverse effect of other opioids, initial encounter: Secondary | ICD-10-CM

## 2018-08-05 DIAGNOSIS — E559 Vitamin D deficiency, unspecified: Secondary | ICD-10-CM | POA: Diagnosis not present

## 2018-08-05 DIAGNOSIS — K219 Gastro-esophageal reflux disease without esophagitis: Secondary | ICD-10-CM | POA: Diagnosis not present

## 2018-08-05 DIAGNOSIS — M961 Postlaminectomy syndrome, not elsewhere classified: Secondary | ICD-10-CM

## 2018-08-05 LAB — POCT GLYCOSYLATED HEMOGLOBIN (HGB A1C): Hemoglobin A1C: 6.6 % — AB (ref 4.0–5.6)

## 2018-08-05 NOTE — Telephone Encounter (Signed)
° °  Please call Gabriel Cirri at [phone] ext [phone]  PA is requesting patient be seen today for new onset afib.

## 2018-08-05 NOTE — Patient Instructions (Signed)
Medication Instructions:  Continue current medications  If you need a refill on your cardiac medications before your next appointment, please call your pharmacy.  Labwork: None Ordered   If you have labs (blood work) drawn today and your tests are completely normal, you will receive your results only by: Marland Kitchen MyChart Message (if you have MyChart) OR . A paper copy in the mail If you have any lab test that is abnormal or we need to change your treatment, we will call you to review the results.  Testing/Procedures: Your physician has recommended that you wear a holter monitor. Holter monitors are medical devices that record the heart's electrical activity. Doctors most often use these monitors to diagnose arrhythmias. Arrhythmias are problems with the speed or rhythm of the heartbeat. The monitor is a small, portable device. You can wear one while you do your normal daily activities. This is usually used to diagnose what is causing palpitations/syncope (passing out).  Your physician has requested that you have a carotid duplex. This test is an ultrasound of the carotid arteries in your neck. It looks at blood flow through these arteries that supply the brain with blood. Allow one hour for this exam. There are no restrictions or special instructions.  Follow-Up: . You will need a follow up appointment in 1 Month.   .   At [facility], you and your health needs are our priority.  As part of our continuing mission to provide you with exceptional heart care, we have created designated Provider Care Teams.  These Care Teams include your primary Cardiologist (physician) and Advanced Practice Providers (APPs -  Physician Assistants and Nurse Practitioners) who all work together to provide you with the care you need, when you need it.   Thank you for choosing [facility]!!

## 2018-08-05 NOTE — Progress Notes (Signed)
Sujbective: Chief Complaint  Patient presents with  . follow up    fasting follow up   Here for chronic disease f/u, fasting.  Medical team Dr. McDiarmid, urology Dr. Arley Phenix, cardiology Dr. Karlyn Agee, surgery Dr. Joni Fears, ortho Dr. Raynelle Fanning, ophthalmology Zaion Hreha, Camelia Eng, PA-C here for primary care   Diabetes - compliant with medication, glucose running less than 130 fasting, not exercising much due to feet pain  Still has ongoing back pain and neuropathy.  Bones hurt in general.  Ache in knees, wrists, back, etc.  Chalks this up to arthritis.   Sees neurosurgery for pain.  She has not really felt that the spinal cord stim later has been beneficial.  Her feet pain is constant and her feet felt cold all the time  Still having problems with bladder prolapse, has f/u soon with Urology  Medication log reviewed and she is compliant with all other medications  No recent SOB, chest pain, but does get occasional palpitations.  Past Medical History:  Diagnosis Date  . Adrenal mass, right ([place])   . Allergy   . Anemia   . Asthma    "as a teenager"  . Blind right eye    since childhood  . Blood transfusion 1970  . Chronic back pain   . Chronic diarrhea    Colestipol therapy  . Depression   . Diverticulitis   . Diverticulosis   . Fatty liver   . Female bladder prolapse   . GERD (gastroesophageal reflux disease)   . Glaucoma   . H/O bone density study 11/29/2014   normal study although mild decreased in density from prior study  . H/O hiatal hernia   . Hemorrhoids   . History of kidney stones   . History of uterine cancer    s/p hysterectomy  . Hyperlipidemia   . Hypertension   . Migraine    hx of migraines in past   . Neuromuscular disorder ([place])    diabetic neuropahthy  . Numbness and tingling of both legs   . Osteoarthritis   . Rheumatic fever    age 12  . Type II diabetes mellitus ([place])   . Urinary incontinence   . Vitamin D deficiency     Current Outpatient Medications on File Prior to Visit  Medication Sig Dispense Refill  . ACCU-CHEK SOFTCLIX LANCETS lancets TEST DAILY 100 each 5  . atorvastatin (LIPITOR) 40 MG tablet TAKE 1 TABLET BY MOUTH ONCE DAILY AT BEDTIME 90 tablet 0  . B-D UF III MINI PEN NEEDLES 31G X 5 MM MISC USE TO INJECT TOUJEO 100 each 3  . Blood Glucose Calibration (ACCU-CHEK INSTANT CONTROL) LIQD Use for controls for accu-chek activa meter 1 each 0  . brimonidine (ALPHAGAN) 0.2 % ophthalmic solution Place 1 drop into the left eye 2 times daily.    Marland Kitchen BYDUREON BCISE 2 MG/0.85ML AUIJ  INJECT CONTENTS OF ONE SYRINGE SUBCUTANEOUSLY ONCE A WEEK 15 mL 2  . cholecalciferol (VITAMIN D) 1000 units tablet Take 1 tablet (1,000 Units total) by mouth daily. 90 tablet 3  . DEXILANT 60 MG capsule TAKE 1 CAPSULE BY MOUTH TWICE DAILY 180 capsule 1  . dorzolamide-timolol (COSOPT) 22.3-6.8 MG/ML ophthalmic solution Place 1 drop into both eyes 2 (two) times daily.    . DULoxetine (CYMBALTA) 60 MG capsule TAKE 1 CAPSULE BY MOUTH ONCE DAILY 90 capsule 0  . enalapril (VASOTEC) 20 MG tablet TAKE 1 TABLET BY MOUTH ONCE DAILY 90 tablet 0  .  ferrous gluconate (FERGON) 324 MG tablet Take 1 tablet (324 mg total) by mouth daily with breakfast. 90 tablet 1  . glucose blood (ACCU-CHEK AVIVA PLUS) test strip Test BID. Dx Code- E11.8 300 each 1  . hydrochlorothiazide (HYDRODIURIL) 25 MG tablet TAKE 1 TABLET BY MOUTH ONCE DAILY 90 tablet 3  . HYDROcodone-acetaminophen (NORCO) 10-325 MG tablet 1 tablet every 6 (six) hours.    . hydrocortisone (PROCTOSOL HC) 2.5 % rectal cream Place 1 application rectally 2 (two) times daily. 30 g 2  . Insulin Glargine (TOUJEO MAX SOLOSTAR) 300 UNIT/ML SOPN Inject 16 Units into the skin once.    . Insulin Pen Needle (PEN NEEDLES) 31G X 6 MM MISC Use these for Toujeo pen. 200 each 11  . Lancets (ACCU-CHEK SOFT TOUCH) lancets Test 2-3 times a day 100 each 5  . latanoprost (XALATAN) 0.005 % ophthalmic solution  Place 1 drop into the right eye at bedtime.    . levETIRAcetam (KEPPRA) 250 MG tablet Take 250 mg by mouth daily.    . metFORMIN (GLUCOPHAGE) 1000 MG tablet TAKE 1 TABLET BY MOUTH TWICE DAILY WITH A MEAL 180 tablet 0  . triamcinolone cream (KENALOG) 0.1 % Apply 1 application topically as needed (for skin irritation). Reported on 04/24/2016     No current facility-administered medications on file prior to visit.    ROS as in subjective   Objective: BP 130/84   Pulse 63   Temp 98 F (36.7 C) (Oral)   Resp 16   Ht 5\' 5"  (1.651 m)   Wt 196 lb 9.6 oz (89.2 kg)   SpO2 96%   BMI 32.72 kg/m   Wt Readings from Last 3 Encounters:  08/05/18 196 lb 9.6 oz (89.2 kg)  03/31/18 186 lb 12.8 oz (84.7 kg)  12/30/17 181 lb 9.6 oz (82.4 kg)   General appearence: alert, no distress, WD/WN,  Neck: supple, no lymphadenopathy, no thyromegaly, no masses Heart: rate seems irregular today, no murmurs Lungs: CTA bilaterally, no wheezes, rhonchi, or rales Ext: no edema Pulses: 1+ symmetric, upper and lower extremities, normal cap refill  Diabetic Foot Exam - Simple   Simple Foot Form Diabetic Foot exam was performed with the following findings:  Yes 08/05/2018 10:00 AM  Visual Inspection No deformities, no ulcerations, no other skin breakdown bilaterally:  Yes Sensation Testing See comments:  Yes Pulse Check See comments:  Yes Comments 1+  Pedal pulses, decreased sensation thorughout, no foot lesions otherwise      Adult ECG Report  Indication: arrthymia  Rate: 80 bpm  Rhythm: atrial fibrillation  QRS Axis: -23 degrees  PR Interval: unable to determine  QRS Duration: 65ms  QTc: 479ms  Conduction Disturbances: none  Other Abnormalities: none  Patient's cardiac risk factors are: diabetes mellitus, dyslipidemia, hypertension and obesity (BMI >= 30 kg/m2).  EKG comparison: 2018  Narrative Interpretation: new onset Afib    Assessment: Encounter Diagnoses  Name Primary?  . Diabetes  mellitus with complication ([place]) Yes  . Need for influenza vaccination   . Essential hypertension, benign   . Idiopathic peripheral neuropathy   . Anemia of chronic disease   . Hyperlipidemia, unspecified hyperlipidemia type   . Vitamin D deficiency   . Periodic limb movement disorder   . Hemorrhoids, unspecified hemorrhoid type   . Constipation due to opioid therapy   . Gastroesophageal reflux disease without esophagitis   . Arthritis   . Spondylolisthesis of lumbar region   . Chronic bilateral low back pain with  left-sided sciatica   . Other chronic pain   . Glaucoma, unspecified glaucoma type, unspecified laterality   . History of back surgery   . Nocturnal leg movements   . Post laminectomy syndrome   . Encounter for hepatitis C screening test for low risk patient   . Atrial fibrillation, unspecified type ([place])      Plan: HgbA1C 6.6% today.  Counseled on the influenza virus vaccine.  Vaccine information sheet given.   High dose Influenza vaccine given after consent obtained.  We discussed her concerns including commercial she has seen on TV for screening.  We have not done a hep C screen on her so we will do that today.  I think she is low risk  I reviewed her recent neurosurgery notes from 05/25/2018 with Dr. Vertell Limber, his notes suggest pain management Dr. Maryjean Ka in the same office as well as consideration for anterior cervical decompression fusion C5-6 and C6-7 due to severe cervical spondylosis neck pain and upper extremity pain.  There was also discussion about lumbar spine injection..  She has not been happy with results of the spinal cord stimulator.  She notes pain all the time.  Bone density was normal back on February 2016.  We reviewed this.  Continue vitamin D, calcium intake  We discussed the need to get some type of exercise such as Silver sneakers programs at the [facility]  She had a mammogram recently at the [facility] center, and I will have my nurse call and get a copy of  this  Neuropathy, chronic leg pain-we will check with physical therapy up in evening to see if they have a program that might benefit her  I examined her towards the end of the visit realize that she had an abnormal heart rhythm and EKG showed atrial fibrillation.  We discussed this to possible complications, possible treatment.  Chads score of 2.  We were able to get her in to cardiology today for appt.  Medications to continue as usual   Anemia, chronic  Continue Fergon/ferrous gluconate iron  Diabetes  Metformin 1000 mg, 1 tablet twice daily  Toujeo 16 units daily at bedtime  Bydureon/B-cise pen, 2 mg injection weekly  High blood pressure  Hydrochlorothiazide 25 mg daily in the morning  Enalapril 20 mg daily in the morning  High cholesterol  Lipitor 40 mg at bedtime daily  Acid reflux  Dexilant 60 mg daily in the morning  Chronic pain  Cymbalta 60 mg daily in the morning  Hydrocodone pain medication per pain management/neurosurgery  Keppra for pain per pain management/neurosurgery  Vitamin D deficiency  Vitamin D 1000 units daily  Glaucoma  Xalatan drops per eye doctor  Cosopt eyedrops per eye doctor  Alphagan eyedrops per eye doctor  As needed medications  Triamcinolone steroid cream for up to 10 days at the time as needed, except do not put on face or genitals  Proctosol or hydrocortisone cream for hemorrhoids as needed  Teneshia was seen today for follow up.  Diagnoses and all orders for this visit:  Diabetes mellitus with complication ([place]) -     HgB A1c -     Hepatitis C antibody -     HIV Antibody (routine testing w rflx) -     HM DIABETES FOOT EXAM -     EKG 12-Lead -     Comprehensive metabolic panel -     CBC -     TSH  Need for influenza vaccination -  Flu vaccine HIGH DOSE PF (Fluzone High Dose)  Essential hypertension, benign -     EKG 12-Lead -     Comprehensive metabolic panel -     CBC -     TSH  Idiopathic  peripheral neuropathy  Anemia of chronic disease  Hyperlipidemia, unspecified hyperlipidemia type  Vitamin D deficiency  Periodic limb movement disorder  Hemorrhoids, unspecified hemorrhoid type  Constipation due to opioid therapy  Gastroesophageal reflux disease without esophagitis  Arthritis  Spondylolisthesis of lumbar region  Chronic bilateral low back pain with left-sided sciatica  Other chronic pain -     Hepatitis C antibody -     HIV Antibody (routine testing w rflx)  Glaucoma, unspecified glaucoma type, unspecified laterality  History of back surgery  Nocturnal leg movements  Post laminectomy syndrome  Encounter for hepatitis C screening test for low risk patient -     Hepatitis C antibody -     HIV Antibody (routine testing w rflx)  Atrial fibrillation, unspecified type ([facility]) -     Comprehensive metabolic panel -     CBC -     TSH

## 2018-08-05 NOTE — Telephone Encounter (Signed)
Pt have appt today @ 12 pm

## 2018-08-05 NOTE — Patient Instructions (Signed)
Medications to continue as usual   Anemia, chronic  Continue Fergon/ferrous gluconate iron  Diabetes  Metformin 1000 mg, 1 tablet twice daily  Toujeo 16 units daily at bedtime  Bydureon/B-cise pen, 2 mg injection weekly  High blood pressure  Hydrochlorothiazide 25 mg daily in the morning  Enalapril 20 mg daily in the morning  High cholesterol  Lipitor 40 mg at bedtime daily  Acid reflux  Dexilant 60 mg daily in the morning  Chronic pain  Cymbalta 60 mg daily in the morning  Hydrocodone pain medication per pain management/neurosurgery  Keppra for pain per pain management/neurosurgery  Vitamin D deficiency  Vitamin D 1000 units daily  Glaucoma  Xalatan drops per eye doctor  Cosopt eyedrops per eye doctor  Alphagan eyedrops per eye doctor  As needed medications  Triamcinolone steroid cream for up to 10 days at the time as needed, except do not put on face or genitals  Proctosol or hydrocortisone cream for hemorrhoids as needed

## 2018-08-05 NOTE — Progress Notes (Signed)
Cardiology Office Note   Date:  08/05/2018   ID:  Robyn Hill, DOB [birth year], MRN [id number]  PCP:  Carlena Hurl, PA-C  Cardiologist:   Minus Breeding, MD   Chief Complaint  Patient presents with  . Irregular Heart Beat      History of Present Illness: Robyn Hill is a 72 y.o. female who presents for evaluation of possible atrial fib.  This was noted today in her primary care office and she was sent here to be added onto my schedule.  Patient is here for routine follow-up.  She occasionally might feel some palpitations but she is not particularly bothered by this..  She denies chest pressure, neck or arm discomfort.  Of breath, PND or orthopnea.   So there is some artifact on the EKG although there is some clear leads clear P waves some of her beats or if there is active the although also be paroxysmal atrial tachycardia.   Past Medical History:  Diagnosis Date  . Adrenal mass, right ([place])   . Allergy   . Anemia   . Asthma    "as a teenager"  . Blind right eye    since childhood  . Blood transfusion 1970  . Chronic back pain   . Chronic diarrhea    Colestipol therapy  . Depression   . Diverticulitis   . Diverticulosis   . Fatty liver   . Female bladder prolapse   . GERD (gastroesophageal reflux disease)   . Glaucoma   . H/O bone density study 11/29/2014   normal study although mild decreased in density from prior study  . H/O hiatal hernia   . Hemorrhoids   . History of kidney stones   . History of uterine cancer    s/p hysterectomy  . Hyperlipidemia   . Hypertension   . Migraine    hx of migraines in past   . Neuromuscular disorder ([place])    diabetic neuropahthy  . Numbness and tingling of both legs   . Osteoarthritis   . Rheumatic fever    age 81  . Type II diabetes mellitus ([place])   . Urinary incontinence   . Vitamin D deficiency     Past Surgical History:  Procedure Laterality Date  . ABDOMINAL HYSTERECTOMY  1980   no further pap  smears needed  . ANTERIOR AND POSTERIOR REPAIR N/A 01/29/2015   Procedure: CYSTOCELE AND RECTOCELE ;  Surgeon: Bjorn Loser, MD;  Location: [facility];  Service: Urology;  Laterality: N/A;  . BACK SURGERY    . BREAST BIOPSY  08/1997   right  . CARDIAC CATHETERIZATION  11/2006  . CHOLECYSTECTOMY    . COLONOSCOPY  2014   polyps, external hemorrhoids  . CYSTOSCOPY N/A 01/29/2015   Procedure: CYSTOSCOPY;  Surgeon: Bjorn Loser, MD;  Location: [facility];  Service: Urology;  Laterality: N/A;  . DILATION AND CURETTAGE OF UTERUS  1980  . EDSI for back pain  last 01/2009   multiple x 5  . EYE SURGERY     Bil Blind R eye  . [place]  . LIVER BIOPSY  04/1999  . minor laceration repair     right eye  . POSTERIOR FUSION LUMBAR SPINE  04/05/12  . PULSE GENERATOR IMPLANT Right 03/12/2016   Procedure: Right Reposition of implantable pulse generator;  Surgeon: Erline Levine, MD;  Location: [place] [facility];  Service: Neurosurgery;  Laterality: Right;  Right Reposition of implantable pulse generator  .  SPINAL CORD STIMULATOR INSERTION N/A 09/06/2015   Procedure: LUMBAR SPINAL CORD STIMULATOR INSERTION;  Surgeon: Clydell Hakim, MD;  Location: [place] [facility];  Service: Neurosurgery;  Laterality: N/A;  Spinal Cord Stimulator placement  . SPINAL CORD STIMULATOR INSERTION N/A 10/22/2015   Procedure: Laminectomy for spinal cord stimulator paddle lead and implantable generator placement;  Surgeon: Erline Levine, MD;  Location: [place] [facility];  Service: Neurosurgery;  Laterality: N/A;  Laminectomy for spinal cord stimulator paddle lead and implantable generator placement  . steroid injections of hip  last 02/2009   multiple x 4  . TONSILLECTOMY AND ADENOIDECTOMY  1952  . TRABECULECTOMY  1996   bilaterally; "for glaucoma"  . UPPER GI ENDOSCOPY  05/21/14   food, retained in the body of the stomach  . VAGINAL PROLAPSE REPAIR N/A 01/29/2015   Procedure:  VAULT PROLAPSE WITH GRAFT ;  Surgeon: Bjorn Loser, MD;   Location: [facility];  Service: Urology;  Laterality: N/A;     Current Outpatient Medications  Medication Sig Dispense Refill  . ACCU-CHEK SOFTCLIX LANCETS lancets TEST DAILY 100 each 5  . atorvastatin (LIPITOR) 40 MG tablet TAKE 1 TABLET BY MOUTH ONCE DAILY AT BEDTIME 90 tablet 0  . B-D UF III MINI PEN NEEDLES 31G X 5 MM MISC USE TO INJECT TOUJEO 100 each 3  . Blood Glucose Calibration (ACCU-CHEK INSTANT CONTROL) LIQD Use for controls for accu-chek activa meter 1 each 0  . brimonidine (ALPHAGAN) 0.2 % ophthalmic solution Place 1 drop into the left eye 2 times daily.    Marland Kitchen BYDUREON BCISE 2 MG/0.85ML AUIJ  INJECT CONTENTS OF ONE SYRINGE SUBCUTANEOUSLY ONCE A WEEK 15 mL 2  . cholecalciferol (VITAMIN D) 1000 units tablet Take 1 tablet (1,000 Units total) by mouth daily. 90 tablet 3  . DEXILANT 60 MG capsule TAKE 1 CAPSULE BY MOUTH TWICE DAILY 180 capsule 1  . dorzolamide-timolol (COSOPT) 22.3-6.8 MG/ML ophthalmic solution Place 1 drop into both eyes 2 (two) times daily.    . DULoxetine (CYMBALTA) 60 MG capsule TAKE 1 CAPSULE BY MOUTH ONCE DAILY 90 capsule 0  . enalapril (VASOTEC) 20 MG tablet TAKE 1 TABLET BY MOUTH ONCE DAILY 90 tablet 0  . ferrous gluconate (FERGON) 324 MG tablet Take 1 tablet (324 mg total) by mouth daily with breakfast. 90 tablet 1  . glucose blood (ACCU-CHEK AVIVA PLUS) test strip Test BID. Dx Code- E11.8 300 each 1  . hydrochlorothiazide (HYDRODIURIL) 25 MG tablet TAKE 1 TABLET BY MOUTH ONCE DAILY 90 tablet 3  . HYDROcodone-acetaminophen (NORCO) 10-325 MG tablet 1 tablet every 6 (six) hours.    . hydrocortisone (PROCTOSOL HC) 2.5 % rectal cream Place 1 application rectally 2 (two) times daily. 30 g 2  . Insulin Glargine (TOUJEO MAX SOLOSTAR) 300 UNIT/ML SOPN Inject 16 Units into the skin once.    . Insulin Pen Needle (PEN NEEDLES) 31G X 6 MM MISC Use these for Toujeo pen. 200 each 11  . Lancets (ACCU-CHEK SOFT TOUCH) lancets Test 2-3 times a day 100 each 5  . latanoprost  (XALATAN) 0.005 % ophthalmic solution Place 1 drop into the right eye at bedtime.    . levETIRAcetam (KEPPRA) 250 MG tablet Take 250 mg by mouth daily.    . metFORMIN (GLUCOPHAGE) 1000 MG tablet TAKE 1 TABLET BY MOUTH TWICE DAILY WITH A MEAL 180 tablet 0  . triamcinolone cream (KENALOG) 0.1 % Apply 1 application topically as needed (for skin irritation). Reported on 04/24/2016     No  current facility-administered medications for this visit.     Allergies:   Kiwi extract; Lyrica [pregabalin]; Sunflowerseed oil; Hydrocodone-acetaminophen; Invokana [canagliflozin]; Neurontin [gabapentin]; Nitrofuran derivatives; and Tape     ROS:  Please see the history of present illness.   Otherwise, review of systems are positive for none.   All other systems are reviewed and negative.    PHYSICAL EXAM: VS:  BP 134/68   Pulse 88   Ht 5\' 5"  (1.651 m)   Wt 196 lb 9.6 oz (89.2 kg)   SpO2 94%   BMI 32.72 kg/m  , BMI Body mass index is 32.72 kg/m.  GENERAL:  Well appearing NECK:  No jugular venous distention, waveform within normal limits, carotid upstroke brisk and symmetric, positive left bruits, no thyromegaly LUNGS:  Clear to auscultation bilaterally BACK:  No CVA tenderness CHEST:  Unremarkable HEART:  PMI not displaced or sustained,S1 and S2 within normal limits, no S3, no S4, no clicks, no rubs, no murmurs ABD:  Flat, positive bowel sounds normal in frequency in pitch, no bruits, no rebound, no guarding, no midline pulsatile mass, no hepatomegaly, no splenomegaly EXT:  2 plus pulses throughout, no edema, no cyanosis no clubbing   EKG:  EKG is  ordered today. The ekg ordered today demonstrates normal sinus rhythm, rate 76, leftward axis, poor anterior R wave progression, intervals within normal limits, no acute ST-T wave changes.   Recent Labs: 12/30/2017: ALT 21; BUN 18; Creatinine, Ser 0.73; Hemoglobin 12.5; Platelets 382; Potassium 5.1; Sodium 142; TSH 1.970    Lipid Panel    Component  Value Date/Time   CHOL 155 12/30/2017 1222   TRIG 112 12/30/2017 1222   HDL 62 12/30/2017 1222   CHOLHDL 2.5 12/30/2017 1222   CHOLHDL 2.6 11/27/2016 1035   VLDL 24 11/27/2016 1035   LDLCALC 71 12/30/2017 1222     Lab Results  Component Value Date   HGBA1C 6.6 (A) 08/05/2018     Wt Readings from Last 3 Encounters:  08/05/18 196 lb 9.6 oz (89.2 kg)  08/05/18 196 lb 9.6 oz (89.2 kg)  03/31/18 186 lb 12.8 oz (84.7 kg)      Other studies Reviewed: Additional studies/ records that were reviewed today include: None. Review of the above records demonstrates:     ASSESSMENT AND PLAN:    DM:   A1c was 6.1 at target.  No change in therapy.  BRUIT:  I will check a carotid Doppler.  She also had a slightly swollen lymph node as above that can be evaluated at that time.   ARRHYTHMIA:   There clearly some P waves evident.  I agree that there are brief runs that could be atrial fibrillation vs MAT.  I we will order a 24-hour Holter with her spinal cord stimulator turned off so that we can get clear tracings.  At this point I do not think her rate is particularly elevated but will be able to assess this to monitor.  I will then make a determination of whether or not she needs anticoagulation.   Current medicines are reviewed at length with the patient today.  The patient does not have concerns regarding medicines.  The following changes have been made:  no change  Labs/ tests ordered today include:  None  Orders Placed This Encounter  Procedures  . HOLTER MONITOR - 24 HOUR     Disposition:   FU with me as needed.     Signed, Minus Breeding, MD  08/05/2018 1:10 PM  [facility]

## 2018-08-06 LAB — COMPREHENSIVE METABOLIC PANEL
ALT: 29 IU/L (ref 0–32)
AST: 28 IU/L (ref 0–40)
Albumin/Globulin Ratio: 1.8 (ref 1.2–2.2)
Albumin: 4.5 g/dL (ref 3.5–4.8)
Alkaline Phosphatase: 83 IU/L (ref 39–117)
BUN/Creatinine Ratio: 20 (ref 12–28)
BUN: 16 mg/dL (ref 8–27)
Bilirubin Total: 0.4 mg/dL (ref 0.0–1.2)
CO2: 25 mmol/L (ref 20–29)
Calcium: 10.2 mg/dL (ref 8.7–10.3)
Chloride: 101 mmol/L (ref 96–106)
Creatinine, Ser: 0.8 mg/dL (ref 0.57–1.00)
GFR calc Af Amer: 85 mL/min/{1.73_m2} (ref >59)
GFR calc non Af Amer: 74 mL/min/{1.73_m2} (ref >59)
Globulin, Total: 2.5 g/dL (ref 1.5–4.5)
Glucose: 101 mg/dL — ABNORMAL HIGH (ref 65–99)
Potassium: 5.2 mmol/L (ref 3.5–5.2)
Sodium: 142 mmol/L (ref 134–144)
Total Protein: 7 g/dL (ref 6.0–8.5)

## 2018-08-06 LAB — CBC
Hematocrit: 38.2 % (ref 34.0–46.6)
Hemoglobin: 12.8 g/dL (ref 11.1–15.9)
MCH: 27.9 pg (ref 26.6–33.0)
MCHC: 33.5 g/dL (ref 31.5–35.7)
MCV: 83 fL (ref 79–97)
Platelets: 322 10*3/uL (ref 150–450)
RBC: 4.58 x10E6/uL (ref 3.77–5.28)
RDW: 12 % — ABNORMAL LOW (ref 12.3–15.4)
WBC: 8.5 10*3/uL (ref 3.4–10.8)

## 2018-08-06 LAB — HIV ANTIBODY (ROUTINE TESTING W REFLEX): HIV Screen 4th Generation wRfx: NONREACTIVE

## 2018-08-06 LAB — HEPATITIS C ANTIBODY: Hep C Virus Ab: <0.1 s/co ratio (ref 0.0–0.9)

## 2018-08-06 LAB — TSH: TSH: 1.32 u[IU]/mL (ref 0.450–4.500)

## 2018-08-08 ENCOUNTER — Other Ambulatory Visit: Payer: Self-pay | Admitting: Medical

## 2018-08-08 MED ORDER — ACCU-CHEK SOFT TOUCH LANCETS MISC: 11 refills | Status: DC

## 2018-08-08 MED ORDER — EXENATIDE ER 2 MG/0.85ML ~~LOC~~ AUIJ: 2.0000 mg | AUTO-INJECTOR | SUBCUTANEOUS | 11 refills | Status: DC

## 2018-08-08 MED ORDER — INSULIN PEN NEEDLE 31G X 5 MM MISC: 11 refills | Status: DC

## 2018-08-08 MED ORDER — METFORMIN HCL 1000 MG PO TABS
ORAL_TABLET | ORAL | 3 refills | Status: DC
Start: 2018-08-08 — End: 2019-07-18

## 2018-08-08 MED ORDER — DEXLANSOPRAZOLE 60 MG PO CPDR: 1.0000 | DELAYED_RELEASE_CAPSULE | Freq: Two times a day (BID) | ORAL | 3 refills | Status: DC

## 2018-08-08 MED ORDER — INSULIN GLARGINE 300 UNIT/ML ~~LOC~~ SOPN
16.0000 [IU] | PEN_INJECTOR | Freq: Once | SUBCUTANEOUS | 11 refills | Status: DC
Start: 2018-08-08 — End: 2018-12-16

## 2018-08-09 ENCOUNTER — Ambulatory Visit: Payer: Medicare Other | Admitting: Gastroenterology

## 2018-08-09 ENCOUNTER — Telehealth: Payer: Self-pay | Admitting: Medical

## 2018-08-09 NOTE — Telephone Encounter (Signed)
Received requested mammogram from [facility]. Sending back for review.

## 2018-08-15 ENCOUNTER — Ambulatory Visit (INDEPENDENT_AMBULATORY_CARE_PROVIDER_SITE_OTHER): Payer: Medicare Other

## 2018-08-15 ENCOUNTER — Ambulatory Visit (HOSPITAL_COMMUNITY)
Admission: RE | Admit: 2018-08-15 | Discharge: 2018-08-15 | Disposition: A | Discharge status: Home / Self Care | Payer: Medicare Other | Source: Ambulatory Visit | Attending: Cardiovascular Disease | Admitting: Cardiovascular Disease

## 2018-08-15 ENCOUNTER — Other Ambulatory Visit: Payer: Self-pay | Admitting: Cardiology

## 2018-08-15 DIAGNOSIS — R002 Palpitations: Secondary | ICD-10-CM

## 2018-08-15 DIAGNOSIS — I499 Cardiac arrhythmia, unspecified: Secondary | ICD-10-CM

## 2018-08-15 DIAGNOSIS — R0989 Other specified symptoms and signs involving the circulatory and respiratory systems: Secondary | ICD-10-CM | POA: Diagnosis not present

## 2018-08-23 ENCOUNTER — Encounter: Payer: Self-pay | Admitting: *Deleted

## 2018-08-23 DIAGNOSIS — M47812 Spondylosis without myelopathy or radiculopathy, cervical region: Secondary | ICD-10-CM | POA: Diagnosis not present

## 2018-09-03 NOTE — Progress Notes (Signed)
Cardiology Office Note   Date:  09/05/2018   ID:  Robyn Hill, DOB 1946/09/08, MRN [id number]  PCP:  Carlena Hurl, PA-C  Cardiologist:   Minus Breeding, MD    Chief Complaint  Patient presents with  . PACs      History of Present Illness: Robyn Hill is a 72 y.o. female who presents for evaluation of possible atrial fib. When I saw her in the office for evaluation of this possibly seen on an EKG at her primary care office I thought that this was [name].  she wore a monitor.   I did not see atrial fib on this monitor.     She returns for follow-up.  She rarely notices palpitations.  She is been feeling relatively well.  She started working out at [employer] doing [employer] and she enjoys this.  She is had no presyncope or syncope.  She has no new shortness of breath, chest discomfort, neck or arm discomfort.   Past Medical History:  Diagnosis Date  . Adrenal mass, right ([place])   . Allergy   . Anemia   . Asthma    "as a teenager"  . Blind right eye    since childhood  . Blood transfusion 1970  . Chronic back pain   . Chronic diarrhea    Colestipol therapy  . Depression   . Diverticulitis   . Diverticulosis   . Fatty liver   . Female bladder prolapse   . GERD (gastroesophageal reflux disease)   . Glaucoma   . H/O bone density study 11/29/2014   normal study although mild decreased in density from prior study  . H/O hiatal hernia   . Hemorrhoids   . History of kidney stones   . History of uterine cancer    s/p hysterectomy  . Hyperlipidemia   . Hypertension   . Migraine    hx of migraines in past   . Neuromuscular disorder ([place])    diabetic neuropahthy  . Numbness and tingling of both legs   . Osteoarthritis   . Rheumatic fever    age 89  . Type II diabetes mellitus ([place])   . Urinary incontinence   . Vitamin D deficiency     Past Surgical History:  Procedure Laterality Date  . ABDOMINAL HYSTERECTOMY  1980   no further pap smears needed  .  ANTERIOR AND POSTERIOR REPAIR N/A 01/29/2015   Procedure: CYSTOCELE AND RECTOCELE ;  Surgeon: Bjorn Loser, MD;  Location: [facility];  Service: Urology;  Laterality: N/A;  . BACK SURGERY    . BREAST BIOPSY  08/1997   right  . CARDIAC CATHETERIZATION  11/2006  . CHOLECYSTECTOMY    . COLONOSCOPY  2014   polyps, external hemorrhoids  . CYSTOSCOPY N/A 01/29/2015   Procedure: CYSTOSCOPY;  Surgeon: Bjorn Loser, MD;  Location: [facility];  Service: Urology;  Laterality: N/A;  . DILATION AND CURETTAGE OF UTERUS  1980  . EDSI for back pain  last 01/2009   multiple x 5  . EYE SURGERY     Bil Blind R eye  . [place]  . LIVER BIOPSY  04/1999  . minor laceration repair     right eye  . POSTERIOR FUSION LUMBAR SPINE  04/05/12  . PULSE GENERATOR IMPLANT Right 03/12/2016   Procedure: Right Reposition of implantable pulse generator;  Surgeon: Erline Levine, MD;  Location: [place] [facility];  Service: Neurosurgery;  Laterality: Right;  Right Reposition of implantable pulse generator  . SPINAL CORD STIMULATOR INSERTION N/A 09/06/2015   Procedure: LUMBAR SPINAL CORD STIMULATOR INSERTION;  Surgeon: Clydell Hakim, MD;  Location: [place] [facility];  Service: Neurosurgery;  Laterality: N/A;  Spinal Cord Stimulator placement  . SPINAL CORD STIMULATOR INSERTION N/A 10/22/2015   Procedure: Laminectomy for spinal cord stimulator paddle lead and implantable generator placement;  Surgeon: Erline Levine, MD;  Location: [place] [facility];  Service: Neurosurgery;  Laterality: N/A;  Laminectomy for spinal cord stimulator paddle lead and implantable generator placement  . steroid injections of hip  last 02/2009   multiple x 4  . TONSILLECTOMY AND ADENOIDECTOMY  1952  . TRABECULECTOMY  1996   bilaterally; "for glaucoma"  . UPPER GI ENDOSCOPY  05/21/14   food, retained in the body of the stomach  . VAGINAL PROLAPSE REPAIR N/A 01/29/2015   Procedure:  VAULT PROLAPSE WITH GRAFT ;  Surgeon: Bjorn Loser, MD;  Location: [facility];   Service: Urology;  Laterality: N/A;     Current Outpatient Medications  Medication Sig Dispense Refill  . ACCU-CHEK SOFTCLIX LANCETS lancets TEST DAILY 100 each 5  . atorvastatin (LIPITOR) 40 MG tablet TAKE 1 TABLET BY MOUTH ONCE DAILY AT BEDTIME 90 tablet 0  . Blood Glucose Calibration (ACCU-CHEK INSTANT CONTROL) LIQD Use for controls for accu-chek activa meter 1 each 0  . brimonidine (ALPHAGAN) 0.2 % ophthalmic solution Place 1 drop into the left eye 2 times daily.    . Celecoxib (CELEBREX PO) Take 200 mg by mouth 2 (two) times daily.    . cholecalciferol (VITAMIN D) 1000 units tablet Take 1 tablet (1,000 Units total) by mouth daily. 90 tablet 3  . dexlansoprazole (DEXILANT) 60 MG capsule Take 1 capsule (60 mg total) by mouth 2 (two) times daily. 180 capsule 3  . dorzolamide-timolol (COSOPT) 22.3-6.8 MG/ML ophthalmic solution Place 1 drop into both eyes 2 (two) times daily.    . DULoxetine (CYMBALTA) 60 MG capsule TAKE 1 CAPSULE BY MOUTH ONCE DAILY 90 capsule 0  . enalapril (VASOTEC) 20 MG tablet TAKE 1 TABLET BY MOUTH ONCE DAILY 90 tablet 0  . Exenatide ER (BYDUREON BCISE) 2 MG/0.85ML AUIJ Inject 2 mg into the skin once a week. 15 mL 11  . ferrous gluconate (FERGON) 324 MG tablet TAKE 1 TABLET BY MOUTH ONCE DAILY WITH BREAKFAST 90 tablet 1  . glucose blood (ACCU-CHEK AVIVA PLUS) test strip Test BID. Dx Code- E11.8 300 each 1  . hydrochlorothiazide (HYDRODIURIL) 25 MG tablet TAKE 1 TABLET BY MOUTH ONCE DAILY 90 tablet 3  . HYDROcodone-acetaminophen (NORCO) 10-325 MG tablet 1 tablet every 6 (six) hours.    . hydrocortisone (PROCTOSOL HC) 2.5 % rectal cream Place 1 application rectally 2 (two) times daily. 30 g 2  . Insulin Pen Needle (B-D UF III MINI PEN NEEDLES) 31G X 5 MM MISC USE TO INJECT TOUJEO 100 each 11  . Lancets (ACCU-CHEK SOFT TOUCH) lancets Test 2-3 times a day 100 each 11  . latanoprost (XALATAN) 0.005 % ophthalmic solution Place 1 drop into the right eye at bedtime.    .  levETIRAcetam (KEPPRA) 250 MG tablet Take 250 mg by mouth daily.    . metFORMIN (GLUCOPHAGE) 1000 MG tablet BID 180 tablet 3  . triamcinolone cream (KENALOG) 0.1 % Apply 1 application topically as needed (for skin irritation). Reported on 04/24/2016    . Insulin Glargine (TOUJEO MAX SOLOSTAR) 300 UNIT/ML SOPN Inject 16 Units into the skin once for 1  dose. 15 mL 11   No current facility-administered medications for this visit.     Allergies:   Kiwi extract; Lyrica [pregabalin]; Sunflowerseed oil; Hydrocodone-acetaminophen; Invokana [canagliflozin]; Neurontin [gabapentin]; Nitrofuran derivatives; and Tape     ROS:  Please see the history of present illness.   Otherwise, review of systems are positive for none.   All other systems are reviewed and negative.    PHYSICAL EXAM: VS:  BP 124/62   Pulse 69   Ht 5\' 5"  (1.651 m)   Wt 199 lb 6.4 oz (90.4 kg)   SpO2 96%   BMI 33.18 kg/m  , BMI Body mass index is 33.18 kg/m.  GENERAL:  Well appearing NECK:  No jugular venous distention, waveform within normal limits, carotid upstroke brisk and symmetric, no bruits, no thyromegaly LUNGS:  Clear to auscultation bilaterally CHEST:  Unremarkable HEART:  PMI not displaced or sustained,S1 and S2 within normal limits, no S3, no S4, no clicks, no rubs, no murmurs ABD:  Flat, positive bowel sounds normal in frequency in pitch, no bruits, no rebound, no guarding, no midline pulsatile mass, no hepatomegaly, no splenomegaly EXT:  2 plus pulses throughout, no edema, no cyanosis no clubbing   EKG:  EKG is  Not ordered today.   Recent Labs: 08/05/2018: ALT 29; BUN 16; Creatinine, Ser 0.80; Hemoglobin 12.8; Platelets 322; Potassium 5.2; Sodium 142; TSH 1.320    Lipid Panel    Component Value Date/Time   CHOL 155 12/30/2017 1222   TRIG 112 12/30/2017 1222   HDL 62 12/30/2017 1222   CHOLHDL 2.5 12/30/2017 1222   CHOLHDL 2.6 11/27/2016 1035   VLDL 24 11/27/2016 1035   LDLCALC 71 12/30/2017 1222       Lab Results  Component Value Date   HGBA1C 6.6 (A) 08/05/2018     Wt Readings from Last 3 Encounters:  09/05/18 199 lb 6.4 oz (90.4 kg)  08/05/18 196 lb 9.6 oz (89.2 kg)  08/05/18 196 lb 9.6 oz (89.2 kg)      Other studies Reviewed: Additional studies/ records that were reviewed today include: None. Review of the above records demonstrates:  None   ASSESSMENT AND PLAN:    DM:   A1c was 6.6.    She continues with meds as listed.   CAROTID STENOSIS:  She had moderate bilateral stenosis in Oct.  I will follow this in one year.    ARRHYTHMIA:    I saw [name] but no evidence of atrial fib.    She will get an Alive Cor and monitor her rhythm at home.  At this point no change in therapy or indication for anticoagulation.     Current medicines are reviewed at length with the patient today.  The patient does not have concerns regarding medicines.  The following changes have been made:  None  Labs/ tests ordered today include: None  No orders of the defined types were placed in this encounter.    Disposition:   FU with in one year.   Signed, Minus Breeding, MD  09/05/2018 10:07 AM    [facility]

## 2018-09-05 ENCOUNTER — Encounter: Payer: Self-pay | Admitting: Cardiology

## 2018-09-05 ENCOUNTER — Ambulatory Visit (INDEPENDENT_AMBULATORY_CARE_PROVIDER_SITE_OTHER): Payer: Medicare Other | Admitting: Cardiology

## 2018-09-05 VITALS — BP 124/62 | HR 69 | Ht 65.0 in | Wt 199.4 lb

## 2018-09-05 DIAGNOSIS — I6523 Occlusion and stenosis of bilateral carotid arteries: Secondary | ICD-10-CM | POA: Diagnosis not present

## 2018-09-05 DIAGNOSIS — I491 Atrial premature depolarization: Secondary | ICD-10-CM | POA: Diagnosis not present

## 2018-09-05 NOTE — Patient Instructions (Signed)
Medication Instructions:  Continue Current Medication If you need a refill on your cardiac medications before your next appointment, please call your pharmacy.  Labwork: None Ordered  If you have labs (blood work) drawn today and your tests are completely normal, you will receive your results only by: Marland Kitchen MyChart Message (if you have MyChart) OR . A paper copy in the mail If you have any lab test that is abnormal or we need to change your treatment, we will call you to review the results.  Testing/Procedures: None Ordered  Special Instructions: Alive cor  Follow-Up: You will need a follow up appointment in 1 Year.  Please call our office 2 months in advance([phone]) to schedule the (1 Year ) appointment.  You may see  DR Percival Spanish, or one of the following Advanced Practice Providers on your designated Care Team:    . Jory Sims, DNP, ANP . Rhonda Barrett, PA-C  . Kerin Ransom, [place]  . Almyra Deforest, PA-C . Fabian Sharp, PA-C  At [facility], you and your health needs are our priority.  As part of our continuing mission to provide you with exceptional heart care, we have created designated Provider Care Teams.  These Care Teams include your primary Cardiologist (physician) and Advanced Practice Providers (APPs -  Physician Assistants and Nurse Practitioners) who all work together to provide you with the care you need, when you need it.  Thank you for choosing [facility] at [facility]!!

## 2018-09-08 DIAGNOSIS — Z1211 Encounter for screening for malignant neoplasm of colon: Secondary | ICD-10-CM | POA: Diagnosis not present

## 2018-09-08 DIAGNOSIS — Z09 Encounter for follow-up examination after completed treatment for conditions other than malignant neoplasm: Secondary | ICD-10-CM | POA: Diagnosis not present

## 2018-09-08 DIAGNOSIS — Z86010 Personal history of colonic polyps: Secondary | ICD-10-CM | POA: Diagnosis not present

## 2018-09-08 DIAGNOSIS — K641 Second degree hemorrhoids: Secondary | ICD-10-CM | POA: Diagnosis not present

## 2018-09-08 DIAGNOSIS — K644 Residual hemorrhoidal skin tags: Secondary | ICD-10-CM | POA: Diagnosis not present

## 2018-09-08 DIAGNOSIS — Z800 Family history of malignant neoplasm of digestive organs: Secondary | ICD-10-CM | POA: Diagnosis not present

## 2018-09-13 DIAGNOSIS — M47812 Spondylosis without myelopathy or radiculopathy, cervical region: Secondary | ICD-10-CM | POA: Diagnosis not present

## 2018-09-16 ENCOUNTER — Other Ambulatory Visit: Payer: Self-pay | Admitting: Medical

## 2018-09-19 NOTE — Telephone Encounter (Signed)
Is this ok to refill?  

## 2018-09-20 DIAGNOSIS — E119 Type 2 diabetes mellitus without complications: Secondary | ICD-10-CM | POA: Diagnosis not present

## 2018-09-20 DIAGNOSIS — H401133 Primary open-angle glaucoma, bilateral, severe stage: Secondary | ICD-10-CM | POA: Diagnosis not present

## 2018-09-26 DIAGNOSIS — L570 Actinic keratosis: Secondary | ICD-10-CM | POA: Diagnosis not present

## 2018-10-20 ENCOUNTER — Other Ambulatory Visit: Payer: Self-pay | Admitting: Medical

## 2018-10-20 DIAGNOSIS — M47812 Spondylosis without myelopathy or radiculopathy, cervical region: Secondary | ICD-10-CM | POA: Diagnosis not present

## 2018-11-03 ENCOUNTER — Encounter: Payer: Self-pay | Admitting: Medical

## 2018-11-03 ENCOUNTER — Ambulatory Visit (INDEPENDENT_AMBULATORY_CARE_PROVIDER_SITE_OTHER): Payer: Medicare Other | Admitting: Medical

## 2018-11-03 VITALS — BP 120/70 | HR 61 | Temp 98.1°F | Ht 65.0 in | Wt 197.8 lb

## 2018-11-03 DIAGNOSIS — R5382 Chronic fatigue, unspecified: Secondary | ICD-10-CM

## 2018-11-03 DIAGNOSIS — E559 Vitamin D deficiency, unspecified: Secondary | ICD-10-CM

## 2018-11-03 DIAGNOSIS — E118 Type 2 diabetes mellitus with unspecified complications: Secondary | ICD-10-CM

## 2018-11-03 DIAGNOSIS — G8929 Other chronic pain: Secondary | ICD-10-CM

## 2018-11-03 DIAGNOSIS — M2041 Other hammer toe(s) (acquired), right foot: Secondary | ICD-10-CM

## 2018-11-03 DIAGNOSIS — R270 Ataxia, unspecified: Secondary | ICD-10-CM

## 2018-11-03 DIAGNOSIS — Z8639 Personal history of other endocrine, nutritional and metabolic disease: Secondary | ICD-10-CM

## 2018-11-03 DIAGNOSIS — L84 Corns and callosities: Secondary | ICD-10-CM

## 2018-11-03 DIAGNOSIS — E785 Hyperlipidemia, unspecified: Secondary | ICD-10-CM

## 2018-11-03 DIAGNOSIS — E1142 Type 2 diabetes mellitus with diabetic polyneuropathy: Secondary | ICD-10-CM | POA: Diagnosis not present

## 2018-11-03 DIAGNOSIS — G609 Hereditary and idiopathic neuropathy, unspecified: Secondary | ICD-10-CM | POA: Diagnosis not present

## 2018-11-03 DIAGNOSIS — M2042 Other hammer toe(s) (acquired), left foot: Secondary | ICD-10-CM

## 2018-11-03 DIAGNOSIS — R0989 Other specified symptoms and signs involving the circulatory and respiratory systems: Secondary | ICD-10-CM

## 2018-11-03 DIAGNOSIS — I10 Essential (primary) hypertension: Secondary | ICD-10-CM | POA: Diagnosis not present

## 2018-11-03 NOTE — Progress Notes (Signed)
Subjective: Chief Complaint  Patient presents with  . Diabetes  . Fatigue   Here for med check.    Needs updated diabetic shoes.   Her and husband need shoes.  She has form to review from [place] in [place] regarding this.  She notes being really tired of living.  She has not had recently started exercising.  She plans to walk 3 days/week in the church gym, and plans to go swimming once a week on Fridays at the [facility] in [place].   Her main concern today is stumbling and feeling off balance the last several weeks.  Sometimes stumbles, sometimes feels off balance, feels weak at times.  She does get numbness in her feet.  She has a history of neuropathy but cannot recall the last time she had nerve conduction studies.  No recent neurology consult.  She has had ABIs in 2015 that were normal.  Her feet and legs always feel cold.  The sensation even wakes her up at night.  Compliant with medications.  She is checking her sugars and recently she has been running in the 70s to higher but she did get a recent 52 reading.  When her numbers are in the 70s she does not feel well.   Past Medical History:  Diagnosis Date  . Adrenal mass, right ([place])   . Allergy   . Anemia   . Asthma    "as a teenager"  . Blind right eye    since childhood  . Blood transfusion 1970  . Chronic back pain   . Chronic diarrhea    Colestipol therapy  . Depression   . Diverticulitis   . Diverticulosis   . Fatty liver   . Female bladder prolapse   . GERD (gastroesophageal reflux disease)   . Glaucoma   . H/O bone density study 11/29/2014   normal study although mild decreased in density from prior study  . H/O hiatal hernia   . Hemorrhoids   . History of kidney stones   . History of uterine cancer    s/p hysterectomy  . Hyperlipidemia   . Hypertension   . Migraine    hx of migraines in past   . Neuromuscular disorder ([place])    diabetic neuropahthy  . Numbness and tingling of both  legs   . Osteoarthritis   . Rheumatic fever    age 29  . Type II diabetes mellitus ([place])   . Urinary incontinence   . Vitamin D deficiency    Current Outpatient Medications on File Prior to Visit  Medication Sig Dispense Refill  . ACCU-CHEK SOFTCLIX LANCETS lancets TEST DAILY 100 each 5  . atorvastatin (LIPITOR) 40 MG tablet TAKE 1 TABLET BY MOUTH ONCE DAILY AT BEDTIME 90 tablet 1  . Blood Glucose Calibration (ACCU-CHEK INSTANT CONTROL) LIQD Use for controls for accu-chek activa meter 1 each 0  . brimonidine (ALPHAGAN) 0.2 % ophthalmic solution Place 1 drop into the left eye 2 times daily.    . Celecoxib (CELEBREX PO) Take 200 mg by mouth 2 (two) times daily.    . cholecalciferol (VITAMIN D) 1000 units tablet Take 1 tablet (1,000 Units total) by mouth daily. 90 tablet 3  . dexlansoprazole (DEXILANT) 60 MG capsule Take 1 capsule (60 mg total) by mouth 2 (two) times daily. 180 capsule 3  . dorzolamide-timolol (COSOPT) 22.3-6.8 MG/ML ophthalmic solution Place 1 drop into both eyes 2 (two) times daily.    . DULoxetine (CYMBALTA) 60 MG capsule  TAKE 1 CAPSULE BY MOUTH ONCE DAILY 90 capsule 0  . enalapril (VASOTEC) 20 MG tablet TAKE 1 TABLET BY MOUTH ONCE DAILY 90 tablet 0  . Exenatide ER (BYDUREON BCISE) 2 MG/0.85ML AUIJ Inject 2 mg into the skin once a week. 15 mL 11  . ferrous gluconate (FERGON) 324 MG tablet TAKE 1 TABLET BY MOUTH ONCE DAILY WITH BREAKFAST 90 tablet 1  . glucose blood (ACCU-CHEK AVIVA PLUS) test strip Test BID. Dx Code- E11.8 300 each 1  . hydrochlorothiazide (HYDRODIURIL) 25 MG tablet TAKE 1 TABLET BY MOUTH ONCE DAILY 90 tablet 3  . HYDROcodone-acetaminophen (NORCO) 10-325 MG tablet 1 tablet every 6 (six) hours.    . hydrocortisone (PROCTOSOL HC) 2.5 % rectal cream Place 1 application rectally 2 (two) times daily. 30 g 2  . Insulin Pen Needle (B-D UF III MINI PEN NEEDLES) 31G X 5 MM MISC USE TO INJECT TOUJEO 100 each 11  . Lancets (ACCU-CHEK SOFT TOUCH) lancets Test 2-3 times  a day 100 each 11  . latanoprost (XALATAN) 0.005 % ophthalmic solution Place 1 drop into the right eye at bedtime.    . levETIRAcetam (KEPPRA) 250 MG tablet Take 250 mg by mouth daily.    . metFORMIN (GLUCOPHAGE) 1000 MG tablet BID 180 tablet 3  . triamcinolone cream (KENALOG) 0.1 % Apply 1 application topically as needed (for skin irritation). Reported on 04/24/2016    . Insulin Glargine (TOUJEO MAX SOLOSTAR) 300 UNIT/ML SOPN Inject 16 Units into the skin once for 1 dose. 15 mL 11   No current facility-administered medications on file prior to visit.    ROS as in subjective   Objective; BP 120/70   Pulse 61   Temp 98.1 F (36.7 C) (Oral)   Ht 5\' 5"  (1.651 m)   Wt 197 lb 12.8 oz (89.7 kg)   SpO2 98%   BMI 32.92 kg/m   General appearence: alert, no distress, WD/WN,  Eyes: pupils mildly reactive, likely due to glaucoma HEENT: normocephalic, sclerae anicteric, TMs pearly, nares patent, no discharge or erythema, pharynx normal Oral cavity: MMM, no lesions Neck: supple, no lymphadenopathy, no thyromegaly, no masses, no bruits Heart: RRR, normal S1, S2, no murmurs Lungs: CTA bilaterally, no wheezes, rhonchi, or rales Abdomen: +bs, soft, non tender, non distended, no masses, no hepatomegaly, no splenomegaly Pulses: 1+ symmetric, upper and lower extremities, normal cap refill Ext: no edema Neuro: Alert and oriented x3, pleasant, answers questions appropriately, finger-nose normal, she does sway to the right with Romberg, DTRs blunted, feet monofilament test abnormal, unable to do heel-to-toe  Diabetic Foot Exam - Simple   Simple Foot Form Diabetic Foot exam was performed with the following findings:  Yes 11/03/2018  5:21 PM  Visual Inspection See comments:  Yes Sensation Testing See comments:  Yes Pulse Check See comments:  Yes Comments Minimal palpable pedal pulses, monofilament only felt in bottom sole of foot, but not toes or MTPs.  Mild callous of lateral 5th great toes, mild  hammer toe deformity bilat 2nd and 3rd toes     Assessment: Encounter Diagnoses  Name Primary?  . Diabetes mellitus with complication ([place]) Yes  . Essential hypertension, benign   . Ataxia   . Vitamin D deficiency   . Hyperlipidemia, unspecified hyperlipidemia type   . History of thyroid disease   . Other chronic pain   . Chronic fatigue   . Idiopathic peripheral neuropathy      Plan: We discussed her concerns, her health issues and  medical diagnoses, medications  I will work on forms to date her diabetic shoe prescription  Labs as below  I reviewed her recent cardiology notes from 08/2018  Continue current medications, continue daily foot checks, continue yearly eye doctor and dentist visit, glad to hear she and has her exercising some.  We discussed that her neuropathy symptoms are likely related to diabetes.  However it might be time for her to go see neurology for updated evaluation given the neuropathy and ataxia  Robyn Hill was seen today for diabetes and fatigue.  Diagnoses and all orders for this visit:  Diabetes mellitus with complication ([place]) -     Lipid panel -     Hemoglobin A1c -     HM DIABETES EYE EXAM -     HM DIABETES FOOT EXAM -     Microalbumin / creatinine urine ratio  Essential hypertension, benign -     Lipid panel  Ataxia -     TSH -     Comprehensive metabolic panel -     Vitamin B12  Vitamin D deficiency  Hyperlipidemia, unspecified hyperlipidemia type -     Lipid panel  History of thyroid disease  Other chronic pain  Chronic fatigue -     TSH -     Vitamin B12  Idiopathic peripheral neuropathy -     TSH -     Comprehensive metabolic panel -     Vitamin B12

## 2018-11-08 ENCOUNTER — Other Ambulatory Visit: Payer: Self-pay | Admitting: Medical

## 2018-11-08 MED ORDER — ENALAPRIL MALEATE 20 MG PO TABS: 20.0000 mg | ORAL_TABLET | Freq: Every day | ORAL | 3 refills | Status: DC

## 2018-11-08 MED ORDER — HYDROCHLOROTHIAZIDE 25 MG PO TABS: 25.0000 mg | ORAL_TABLET | Freq: Every day | ORAL | 3 refills | Status: DC

## 2018-11-09 ENCOUNTER — Other Ambulatory Visit: Payer: Self-pay | Admitting: Medical

## 2018-11-09 DIAGNOSIS — R0989 Other specified symptoms and signs involving the circulatory and respiratory systems: Secondary | ICD-10-CM | POA: Insufficient documentation

## 2018-11-09 DIAGNOSIS — M2042 Other hammer toe(s) (acquired), left foot: Secondary | ICD-10-CM

## 2018-11-09 DIAGNOSIS — L84 Corns and callosities: Secondary | ICD-10-CM | POA: Insufficient documentation

## 2018-11-09 DIAGNOSIS — M2041 Other hammer toe(s) (acquired), right foot: Secondary | ICD-10-CM | POA: Insufficient documentation

## 2018-11-09 DIAGNOSIS — E1142 Type 2 diabetes mellitus with diabetic polyneuropathy: Secondary | ICD-10-CM | POA: Insufficient documentation

## 2018-11-10 LAB — HEMOGLOBIN A1C
Est. average glucose Bld gHb Est-mCnc: 140 mg/dL
Hgb A1c MFr Bld: 6.5 % — ABNORMAL HIGH (ref 4.8–5.6)

## 2018-11-10 LAB — COMPREHENSIVE METABOLIC PANEL
ALT: 21 IU/L (ref 0–32)
AST: 16 IU/L (ref 0–40)
Albumin/Globulin Ratio: 1.6 (ref 1.2–2.2)
Albumin: 4.4 g/dL (ref 3.5–4.8)
Alkaline Phosphatase: 82 IU/L (ref 39–117)
BUN/Creatinine Ratio: 19 (ref 12–28)
BUN: 15 mg/dL (ref 8–27)
Bilirubin Total: 0.5 mg/dL (ref 0.0–1.2)
CO2: 21 mmol/L (ref 20–29)
Calcium: 10.2 mg/dL (ref 8.7–10.3)
Chloride: 99 mmol/L (ref 96–106)
Creatinine, Ser: 0.8 mg/dL (ref 0.57–1.00)
GFR calc Af Amer: 85 mL/min/{1.73_m2} (ref >59)
GFR calc non Af Amer: 74 mL/min/{1.73_m2} (ref >59)
Globulin, Total: 2.7 g/dL (ref 1.5–4.5)
Glucose: 46 mg/dL — ABNORMAL LOW (ref 65–99)
Potassium: 5.4 mmol/L — ABNORMAL HIGH (ref 3.5–5.2)
Sodium: 146 mmol/L — ABNORMAL HIGH (ref 134–144)
Total Protein: 7.1 g/dL (ref 6.0–8.5)

## 2018-11-10 LAB — MICROALBUMIN / CREATININE URINE RATIO

## 2018-11-10 LAB — LIPID PANEL
Chol/HDL Ratio: 2.2 ratio (ref 0.0–4.4)
Cholesterol, Total: 149 mg/dL (ref 100–199)
HDL: 68 mg/dL (ref >39)
LDL Calculated: 64 mg/dL (ref 0–99)
Triglycerides: 87 mg/dL (ref 0–149)
VLDL Cholesterol Cal: 17 mg/dL (ref 5–40)

## 2018-11-10 LAB — TSH: TSH: 0.951 u[IU]/mL (ref 0.450–4.500)

## 2018-11-10 LAB — VITAMIN B12: Vitamin B-12: 254 pg/mL (ref 232–1245)

## 2018-11-10 NOTE — Telephone Encounter (Signed)
Is this ok to refill?  

## 2018-11-14 ENCOUNTER — Other Ambulatory Visit: Payer: Self-pay

## 2018-11-14 ENCOUNTER — Other Ambulatory Visit (INDEPENDENT_AMBULATORY_CARE_PROVIDER_SITE_OTHER): Payer: Medicare Other

## 2018-11-14 DIAGNOSIS — R0989 Other specified symptoms and signs involving the circulatory and respiratory systems: Secondary | ICD-10-CM

## 2018-11-14 DIAGNOSIS — Z0000 Encounter for general adult medical examination without abnormal findings: Secondary | ICD-10-CM

## 2018-11-14 DIAGNOSIS — G609 Hereditary and idiopathic neuropathy, unspecified: Secondary | ICD-10-CM

## 2018-11-14 LAB — POCT URINALYSIS DIP (PROADVANTAGE DEVICE)
Bilirubin, UA: NEGATIVE
Blood, UA: NEGATIVE
Glucose, UA: NEGATIVE mg/dL
Ketones, POC UA: NEGATIVE mg/dL
Leukocytes, UA: NEGATIVE
Nitrite, UA: NEGATIVE
Protein Ur, POC: NEGATIVE mg/dL
Specific Gravity, Urine: 1.025
Urobilinogen, Ur: NEGATIVE
pH, UA: 6 (ref 5.0–8.0)

## 2018-11-22 ENCOUNTER — Ambulatory Visit (INDEPENDENT_AMBULATORY_CARE_PROVIDER_SITE_OTHER): Payer: Medicare Other | Admitting: Neurology

## 2018-11-22 ENCOUNTER — Encounter: Payer: Self-pay | Admitting: Neurology

## 2018-11-22 VITALS — BP 128/66 | HR 90 | Ht 65.0 in | Wt 199.3 lb

## 2018-11-22 DIAGNOSIS — E1142 Type 2 diabetes mellitus with diabetic polyneuropathy: Secondary | ICD-10-CM

## 2018-11-22 DIAGNOSIS — H547 Unspecified visual loss: Secondary | ICD-10-CM | POA: Diagnosis not present

## 2018-11-22 NOTE — Progress Notes (Signed)
Reason for visit: Peripheral neuropathy  Referring physician: Dr. Windell Norfolk Robyn Hill is a 73 y.o. female  History of present illness:  Robyn Hill is a 73 year old right-handed white female with a history of diabetes dating back to her late 84s or early 59s.  The patient over the last year or 2 has noted some cold sensations of the feet, when she tries to warm her feet up then she will have burning sensations in the evening hours.  The patient has difficulty sleeping because of this.  She has also noted some problems with gait instability over the last couple years as well, she has not sustained any falls but she feels imbalanced.  The patient has poor vision secondary to glaucoma.  She has not been able to operate a motor vehicle because of this.  She has chronic low back pain, she has a spinal stimulator in, Dr. Maryjean Ka is treating her for her chronic pain.  She also has neck pain and left occipital pain, she will be getting a nerve ablation in the near future.  The patient has been placed on Cymbalta 60 mg daily for her low back pain, she takes Keppra 250 mg at night for the foot discomfort, this has not helped much.  She does have a bowel and bladder incontinence issues.  She denies any weakness of the extremities.  She has not had any numbness in the hands.  The patient is sent to this office for an evaluation.  Past Medical History:  Diagnosis Date  . Adrenal mass, right ([place])   . Allergy   . Anemia   . Asthma    "as a teenager"  . Blind right eye    since childhood  . Blood transfusion 1970  . Chronic back pain   . Chronic diarrhea    Colestipol therapy  . Depression   . Diverticulitis   . Diverticulosis   . Fatty liver   . Female bladder prolapse   . GERD (gastroesophageal reflux disease)   . Glaucoma   . H/O bone density study 11/29/2014   normal study although mild decreased in density from prior study  . H/O hiatal hernia   . Hemorrhoids   . History of kidney  stones   . History of uterine cancer    s/p hysterectomy  . Hyperlipidemia   . Hypertension   . Migraine    hx of migraines in past   . Neuromuscular disorder ([place])    diabetic neuropahthy  . Numbness and tingling of both legs   . Osteoarthritis   . Rheumatic fever    age 84  . Type II diabetes mellitus ([place])   . Urinary incontinence   . Vitamin D deficiency     Past Surgical History:  Procedure Laterality Date  . ABDOMINAL HYSTERECTOMY  1980   no further pap smears needed  . ANTERIOR AND POSTERIOR REPAIR N/A 01/29/2015   Procedure: CYSTOCELE AND RECTOCELE ;  Surgeon: Bjorn Loser, MD;  Location: [facility];  Service: Urology;  Laterality: N/A;  . BACK SURGERY    . BREAST BIOPSY  08/1997   right  . CARDIAC CATHETERIZATION  11/2006  . CHOLECYSTECTOMY    . COLONOSCOPY  2014   polyps, external hemorrhoids  . CYSTOSCOPY N/A 01/29/2015   Procedure: CYSTOSCOPY;  Surgeon: Bjorn Loser, MD;  Location: [facility];  Service: Urology;  Laterality: N/A;  . DILATION AND CURETTAGE OF UTERUS  1980  . EDSI for back pain  last 01/2009   multiple x 5  . EYE SURGERY     Bil Blind R eye  . [place]  . LIVER BIOPSY  04/1999  . minor laceration repair     right eye  . POSTERIOR FUSION LUMBAR SPINE  04/05/12  . PULSE GENERATOR IMPLANT Right 03/12/2016   Procedure: Right Reposition of implantable pulse generator;  Surgeon: Erline Levine, MD;  Location: [facility];  Service: Neurosurgery;  Laterality: Right;  Right Reposition of implantable pulse generator  . SPINAL CORD STIMULATOR INSERTION N/A 09/06/2015   Procedure: LUMBAR SPINAL CORD STIMULATOR INSERTION;  Surgeon: Clydell Hakim, MD;  Location: [facility];  Service: Neurosurgery;  Laterality: N/A;  Spinal Cord Stimulator placement  . SPINAL CORD STIMULATOR INSERTION N/A 10/22/2015   Procedure: Laminectomy for spinal cord stimulator paddle lead and implantable generator placement;  Surgeon: Erline Levine, MD;  Location: [facility];  Service: Neurosurgery;  Laterality: N/A;  Laminectomy for spinal cord stimulator paddle lead and implantable generator placement  . steroid injections of hip  last 02/2009   multiple x 4  . TONSILLECTOMY AND ADENOIDECTOMY  1952  . TRABECULECTOMY  1996   bilaterally; "for glaucoma"  . UPPER GI ENDOSCOPY  05/21/14   food, retained in the body of the stomach  . VAGINAL PROLAPSE REPAIR N/A 01/29/2015   Procedure:  VAULT PROLAPSE WITH GRAFT ;  Surgeon: Bjorn Loser, MD;  Location: [facility];  Service: Urology;  Laterality: N/A;    Family History  Problem Relation Age of Onset  . Heart attack Brother 81  . Hypertension Other   . Diabetes Other   . Cancer Mother 95  . Aplastic anemia Mother 39  . Heart attack Brother 40  . Colon cancer Brother   . Heart attack Sister   . Heart attack Brother 37  . Anesthesia problems Neg Hx     Social history:  reports that she has never smoked. She has never used smokeless tobacco. She reports current alcohol use. She reports that she does not use drugs.  Medications:  Prior to Admission medications   Medication Sig Start Date End Date Taking? Authorizing Provider  ACCU-CHEK SOFTCLIX LANCETS lancets TEST DAILY 06/22/17   Tysinger, Camelia Eng, PA-C  atorvastatin (LIPITOR) 40 MG tablet TAKE 1 TABLET BY MOUTH ONCE DAILY AT BEDTIME 10/20/18   Tysinger, Camelia Eng, PA-C  Blood Glucose Calibration (ACCU-CHEK INSTANT CONTROL) LIQD Use for controls for accu-chek activa meter 11/24/16   Tysinger, Camelia Eng, PA-C  brimonidine (ALPHAGAN) 0.2 % ophthalmic solution Place 1 drop into the left eye 2 times daily. 01/25/17   [provider]  Celecoxib (CELEBREX PO) Take 200 mg by mouth 2 (two) times daily.    [provider]  cholecalciferol (VITAMIN D) 1000 units tablet Take 1 tablet (1,000 Units total) by mouth daily. 01/03/18   Tysinger, Camelia Eng, PA-C  dexlansoprazole (DEXILANT) 60 MG capsule Take 1 capsule (60 mg total) by mouth 2 (two) times daily. 08/08/18    Tysinger, Camelia Eng, PA-C  dorzolamide-timolol (COSOPT) 22.3-6.8 MG/ML ophthalmic solution Place 1 drop into both eyes 2 (two) times daily.    [provider]  DULoxetine (CYMBALTA) 60 MG capsule TAKE 1 CAPSULE BY MOUTH ONCE DAILY 11/10/18   Tysinger, Camelia Eng, PA-C  enalapril (VASOTEC) 20 MG tablet Take 1 tablet (20 mg total) by mouth daily. 11/08/18   Tysinger, Camelia Eng, PA-C  Exenatide ER (BYDUREON BCISE) 2 MG/0.85ML AUIJ Inject 2 mg  into the skin once a week. 08/08/18   Tysinger, Camelia Eng, PA-C  ferrous gluconate (FERGON) 324 MG tablet TAKE 1 TABLET BY MOUTH ONCE DAILY WITH BREAKFAST 08/08/18   Tysinger, Camelia Eng, PA-C  glucose blood (ACCU-CHEK AVIVA PLUS) test strip Test BID. Dx Code- E11.8 10/14/17   Tysinger, Camelia Eng, PA-C  hydrochlorothiazide (HYDRODIURIL) 25 MG tablet Take 1 tablet (25 mg total) by mouth daily. 11/08/18   Tysinger, Camelia Eng, PA-C  HYDROcodone-acetaminophen (NORCO) 10-325 MG tablet 1 tablet every 6 (six) hours. 03/12/17   [provider]  hydrocortisone (PROCTOSOL HC) 2.5 % rectal cream Place 1 application rectally 2 (two) times daily. 03/31/18   Tysinger, Camelia Eng, PA-C  Insulin Glargine (TOUJEO MAX SOLOSTAR) 300 UNIT/ML SOPN Inject 16 Units into the skin once for 1 dose. 08/08/18 08/08/18  Tysinger, Camelia Eng, PA-C  Insulin Pen Needle (B-D UF III MINI PEN NEEDLES) 31G X 5 MM MISC USE TO INJECT TOUJEO 08/08/18   Tysinger, Camelia Eng, PA-C  latanoprost (XALATAN) 0.005 % ophthalmic solution Place 1 drop into the right eye at bedtime.    [provider]  levETIRAcetam (KEPPRA) 250 MG tablet Take 250 mg by mouth daily.    [provider]  metFORMIN (GLUCOPHAGE) 1000 MG tablet BID 08/08/18   Tysinger, Camelia Eng, PA-C  triamcinolone cream (KENALOG) 0.1 % Apply 1 application topically as needed (for skin irritation). Reported on 04/24/2016    [provider]      Allergies  Allergen Reactions  . Kiwi Extract Hives  . Lyrica [Pregabalin] Other (See  Comments)    psychosis  . Sunflowerseed Oil Hives  . Hydrocodone-Acetaminophen Nausea Only    "Extended release form only"  . Invokana [Canagliflozin] Other (See Comments)    UTI  . Neurontin [Gabapentin] Other (See Comments)    Intolerance/ineffective  . Nitrofuran Derivatives Nausea Only and Other (See Comments)    stomach pain and weight loss  . Tape Other (See Comments)    Pulls skin off, can use paper tape    ROS:  Out of a complete 14 system review of symptoms, the patient complains only of the following symptoms, and all other reviewed systems are negative.  Fatigue Palpitations of the heart Itching Cough Incontinence of the bowels and the bladder, urination problems Feeling hot, cold Joint pain, muscle cramps, aching muscles Allergies, skin sensitivity Numbness of the feet, difficulty swallowing, dizziness Sleepiness  Blood pressure 128/66, pulse 90, height 5\' 5"  (1.651 m), weight 199 lb 5 oz (90.4 kg).  Physical Exam  General: The patient is alert and cooperative at the time of the examination.  The patient is moderately to markedly obese.  Eyes: Pupils are equal, round, and reactive to light. Discs are flat bilaterally.  Neck: The neck is supple, no carotid bruits are noted.  Respiratory: The respiratory examination is clear.  Cardiovascular: The cardiovascular examination reveals a regular rate and rhythm, no obvious murmurs or rubs are noted.  Skin: Extremities are without significant edema.  Neurologic Exam  Mental status: The patient is alert and oriented x 3 at the time of the examination. The patient has apparent normal recent and remote memory, with an apparently normal attention span and concentration ability.  Cranial nerves: Facial symmetry is present. There is good sensation of the face to pinprick and soft touch bilaterally. The strength of the facial muscles and the muscles to head turning and shoulder shrug are normal bilaterally. Speech is  well enunciated, no aphasia or dysarthria is  noted. Extraocular movements are full. Visual fields are notable for constriction of peripheral vision on the left, the patient appears to have a right homonymous visual field deficit. The tongue is midline, and the patient has symmetric elevation of the soft palate. No obvious hearing deficits are noted.  Motor: The motor testing reveals 5 over 5 strength of all 4 extremities. Good symmetric motor tone is noted throughout.  Sensory: Sensory testing is intact to pinprick, soft touch, vibration sensation, and position sense on the upper extremities.  With the lower extremities, the patient has a stocking pattern pinprick sensory deficit in the distal two thirds of the legs, but she appears to have well-maintained vibration and position sense in both feet.  No evidence of extinction is noted.  Coordination: Cerebellar testing reveals good finger-nose-finger and heel-to-shin bilaterally.  Gait and station: Gait is slightly wide-based. Tandem gait is slightly unsteady. Romberg is negative. No drift is seen.  Reflexes: Deep tendon reflexes are symmetric and normal bilaterally, the ankle jerk reflexes are present, slightly decreased. Toes are downgoing bilaterally.   Assessment/Plan:  1.  History of diabetes  2.  Possible mild diabetic neuropathy  3.  Chronic low back pain, spinal stimulator in place  4.  Visual deficits, right homonymous visual field defect  The patient appears to have some mild sensory deficit in the feet, but the ankle jerk reflexes are well-maintained suggesting that if she does have a neuropathy it is not very severe.  The patient has visual impairment with glaucoma, but visual field testing shows evidence of a right homonymous visual field deficit suggesting cerebrovascular disease.  The patient will be set up for CT scan of the brain.  If the stroke is apparent, further cerebrovascular work-up will be undertaken.  The patient will  undergo nerve conduction studies of both legs, EMG of one leg.  If a neuropathy is present, further blood work will be done.  The patient may increase the Keppra dosing to 500 mg at night for the discomfort in the feet.  Jill Alexanders MD 11/22/2018 3:15 PM  [facility] [address] [place], [place] [address]  Phone [phone] Fax [phone]

## 2018-11-23 ENCOUNTER — Telehealth: Payer: Self-pay | Admitting: Neurology

## 2018-11-23 DIAGNOSIS — Z9689 Presence of other specified functional implants: Secondary | ICD-10-CM | POA: Diagnosis not present

## 2018-11-23 DIAGNOSIS — M47812 Spondylosis without myelopathy or radiculopathy, cervical region: Secondary | ICD-10-CM | POA: Diagnosis not present

## 2018-11-23 NOTE — Telephone Encounter (Signed)
lvm for pt to be aware I left [facility] phone number of [phone] and to give them a call if she hasnt heard from them in the next 2-3 business days.

## 2018-11-23 NOTE — Telephone Encounter (Signed)
Medicare/medicaid order sent to [facility]. No auth they will reach out to the pt to schedule.  °

## 2018-11-25 DIAGNOSIS — M47812 Spondylosis without myelopathy or radiculopathy, cervical region: Secondary | ICD-10-CM | POA: Diagnosis not present

## 2018-12-01 ENCOUNTER — Ambulatory Visit
Admission: RE | Admit: 2018-12-01 | Discharge: 2018-12-01 | Disposition: A | Discharge status: Home / Self Care | Payer: Medicare Other | Source: Ambulatory Visit | Attending: Neurology | Admitting: Neurology

## 2018-12-01 DIAGNOSIS — H547 Unspecified visual loss: Secondary | ICD-10-CM

## 2018-12-01 DIAGNOSIS — R42 Dizziness and giddiness: Secondary | ICD-10-CM | POA: Diagnosis not present

## 2018-12-01 DIAGNOSIS — G600 Hereditary motor and sensory neuropathy: Secondary | ICD-10-CM | POA: Diagnosis not present

## 2018-12-04 ENCOUNTER — Telehealth: Payer: Self-pay | Admitting: Neurology

## 2018-12-04 NOTE — Telephone Encounter (Signed)
  I called the patient.  The CT of the head was unremarkable, no evidence of stroke to explain the right homonymous visual field deficit, the patient has severe glaucoma which may be impacting the function of the retina.   CT head 12/02/18:  IMPRESSION:  Unremarkable CT scan of the head showing mildly mild age-appropriate changes of super internal cortical atrophy and mild degree of chronic microvascular ischemic changes. No acute abnormalities noted.

## 2018-12-05 ENCOUNTER — Ambulatory Visit (INDEPENDENT_AMBULATORY_CARE_PROVIDER_SITE_OTHER): Payer: Medicare Other | Admitting: Neurology

## 2018-12-05 ENCOUNTER — Encounter: Payer: Self-pay | Admitting: Neurology

## 2018-12-05 DIAGNOSIS — G609 Hereditary and idiopathic neuropathy, unspecified: Secondary | ICD-10-CM

## 2018-12-05 DIAGNOSIS — E1142 Type 2 diabetes mellitus with diabetic polyneuropathy: Secondary | ICD-10-CM

## 2018-12-05 MED ORDER — BACLOFEN 10 MG PO TABS: 5.0000 mg | ORAL_TABLET | Freq: Every day | ORAL | 1 refills | Status: DC

## 2018-12-05 NOTE — Procedures (Signed)
     HISTORY:  Robyn Hill is a 74 year old white female with a history of diabetes.  The patient reports sensations of coldness in the feet at night, this may keep her awake, she also has frequent cramps in the legs.  The patient is being evaluated for possible neuropathy or lumbar radiculopathy.  She does have a history of chronic low back pain with a spinal stimulator in place.  NERVE CONDUCTION STUDIES:  Nerve conduction studies were performed on both lower extremities.  The distal motor latencies and motor amplitudes for the peroneal and posterior tibial nerves were within normal limits bilaterally.  Slowing was seen for the peroneal and posterior tibial nerves bilaterally.  The sural sensory latencies were normal bilaterally.  The peroneal sensory latencies were slightly prolonged on the right and borderline normal on the left.  The F-wave latencies for the posterior tibial nerves were prolonged on the left and normal on the right.  EMG STUDIES:  EMG study was performed on the right lower extremity:  The tibialis anterior muscle reveals 2 to 4K motor units with full recruitment. No fibrillations or positive waves were seen. The peroneus tertius muscle reveals 2 to 4K motor units with full recruitment. No fibrillations or positive waves were seen. The medial gastrocnemius muscle reveals 1 to 3K motor units with full recruitment. No fibrillations or positive waves were seen. The vastus lateralis muscle reveals 2 to 4K motor units with full recruitment. No fibrillations or positive waves were seen. The iliopsoas muscle reveals 2 to 4K motor units with full recruitment. No fibrillations or positive waves were seen.  Complex repetitive discharges were seen. The biceps femoris muscle (long head) reveals 2 to 4K motor units with full recruitment. No fibrillations or positive waves were seen. The lumbosacral paraspinal muscles were tested at 3 levels, and revealed no abnormalities of insertional  activity at all 3 levels tested. There was good relaxation.   IMPRESSION:  Nerve conduction studies done on both lower extremities shows evidence of primarily motor involvement with slowing of the nerves bilaterally in the lower extremities.  A small fiber neuropathy cannot be excluded by this study.  Clinical correlation is required.  The EMG evaluation of the right lower extremity was relatively unremarkable, no evidence of an overlying lumbar radiculopathy was seen.  Significant distal neuropathic denervation that would be expected from a peripheral neuropathy was not apparent.  Jill Alexanders MD 12/05/2018 9:28 AM  [facility] [address] [place], [place] [address]  Phone [phone] Fax [phone]

## 2018-12-05 NOTE — Progress Notes (Signed)
Please refer to EMG and nerve conduction procedure note.  

## 2018-12-05 NOTE — Progress Notes (Addendum)
The patient comes in today for EMG and nerve conduction study evaluation.  She has gone up on the Keppra taking 250 mg twice daily.  She reports some cold sensations in the feet, she also has frequent muscle cramps in the feet at night.  This may occur 2 or 3 times a week.  The nerve conduction studies show what looks like a primarily motor neuropathy, further blood work will be done, a 24-hour urine for heavy metals will be done.  The patient could potentially have some component of small fiber neuropathy, the patient does have chronic low back pain with a spinal stimulator in place.  Spinal stimulator has been turned off, this has not been effective in controlling the back pain.  A prescription for baclofen will be sent in taking 5 mg at night for the nocturnal leg cramps.  We can go up on the dose if needed.     [place]    Nerve / Sites Muscle Latency Ref. Amplitude Ref. Rel Amp Segments Distance Velocity Ref. Area    ms ms mV mV %  cm m/s m/s mVms  R Peroneal - EDB     Ankle EDB 5.8 ?6.5 3.2 ?2.0 100 Ankle - EDB 9   12.3     Fib head EDB 12.4  3.0  93.6 Fib head - Ankle 29 44 ?44 11.6     Pop fossa EDB 15.2  2.9  95.5 Pop fossa - Fib head 10 36 ?44 11.4         Pop fossa - Ankle      L Peroneal - EDB     Ankle EDB 6.2 ?6.5 2.6 ?2.0 100 Ankle - EDB 9   8.6     Fib head EDB 14.3  1.5  59.7 Fib head - Ankle 30 37 ?44 6.9     Pop fossa EDB 17.0  1.4  87.8 Pop fossa - Fib head 10 37 ?44 6.6         Pop fossa - Ankle      R Tibial - AH     Ankle AH 3.6 ?5.8 7.4 ?4.0 100 Ankle - AH 9   17.1     Pop fossa AH 13.3  4.2  56.3 Pop fossa - Ankle 35 36 ?41 13.7  L Tibial - AH     Ankle AH 3.8 ?5.8 4.7 ?4.0 100 Ankle - AH 9   14.1     Pop fossa AH 13.8  3.3  71.3 Pop fossa - Ankle 35 35 ?41 10.0             SNC    Nerve / Sites Rec. Site Peak Lat Ref.  Amp Ref. Segments Distance    ms ms V V  cm  R Sural - Ankle (Calf)     Calf Ankle 3.3 ?4.4 6 ?6 Calf - Ankle 14  L Sural - Ankle (Calf)    Calf Ankle 3.6 ?4.4 8 ?6 Calf - Ankle 14  R Superficial peroneal - Ankle     Lat leg Ankle 4.6 ?4.4 6 ?6 Lat leg - Ankle 14  L Superficial peroneal - Ankle     Lat leg Ankle 4.4 ?4.4 7 ?6 Lat leg - Ankle 14              F  Wave    Nerve F Lat Ref.   ms ms  R Tibial - AH 51.8 ?56.0  L Tibial - AH 57.5 ?56.0

## 2018-12-08 LAB — HEAVY METALS PROFILE, URINE
Arsenic Ur: 26 ug/L (ref 0–50)
Arsenic(Inorganic),U: NOT DETECTED ug/L (ref 0–19)
Arsenic, 24H Ur: 1 ug/24 hr (ref 0–50)
Creatinine(Crt),U: 1 g/L (ref 0.30–3.00)
Lead, Rand Ur: NOT DETECTED ug/L (ref 0–49)
Mercury, Ur: NOT DETECTED ug/L (ref 0–19)

## 2018-12-09 LAB — MULTIPLE MYELOMA PANEL, SERUM
Albumin SerPl Elph-Mcnc: 3.8 g/dL (ref 2.9–4.4)
Albumin/Glob SerPl: 1.3 (ref 0.7–1.7)
Alpha 1: 0.3 g/dL (ref 0.0–0.4)
Alpha2 Glob SerPl Elph-Mcnc: 1 g/dL (ref 0.4–1.0)
B-Globulin SerPl Elph-Mcnc: 1.1 g/dL (ref 0.7–1.3)
Gamma Glob SerPl Elph-Mcnc: 0.7 g/dL (ref 0.4–1.8)
Globulin, Total: 3.1 g/dL (ref 2.2–3.9)
IgA/Immunoglobulin A, Serum: 238 mg/dL (ref 64–422)
IgG (Immunoglobin G), Serum: 859 mg/dL (ref 700–1600)
IgM (Immunoglobulin M), Srm: 51 mg/dL (ref 26–217)
Total Protein: 6.9 g/dL (ref 6.0–8.5)

## 2018-12-09 LAB — B. BURGDORFI ANTIBODIES

## 2018-12-09 LAB — COPPER, SERUM: Copper: 99 ug/dL (ref 72–166)

## 2018-12-09 LAB — ANA W/REFLEX

## 2018-12-09 LAB — ANGIOTENSIN CONVERTING ENZYME: Angio Convert Enzyme: 28 U/L (ref 14–82)

## 2018-12-12 ENCOUNTER — Telehealth: Payer: Self-pay | Admitting: Medical

## 2018-12-12 NOTE — Telephone Encounter (Signed)
Pt left message that Toujeo will require a P.A. I called pharmacy and they said was refill too soon that not due til tomorrow and test claim showing no issues with fill for tomorrow.  Called pt and she states that got letter that getting temporary supply until 3/20.  New insurance [facility] ID# [id number] t# [phone]

## 2018-12-13 ENCOUNTER — Telehealth: Payer: Self-pay | Admitting: Medical

## 2018-12-13 NOTE — Telephone Encounter (Signed)
Pt called and wanted to know if she could get something in writing about her being medically disabled and unable to appear for jury duty and have it mailed to her address.

## 2018-12-14 ENCOUNTER — Encounter: Payer: Self-pay | Admitting: Medical

## 2018-12-14 NOTE — Telephone Encounter (Signed)
Please print and mail her the jury duty letter

## 2018-12-15 NOTE — Telephone Encounter (Signed)
Done

## 2018-12-16 ENCOUNTER — Other Ambulatory Visit: Payer: Self-pay | Admitting: Medical

## 2018-12-16 ENCOUNTER — Telehealth: Payer: Self-pay | Admitting: Medical

## 2018-12-16 MED ORDER — INSULIN DETEMIR 100 UNIT/ML FLEXPEN: 16.0000 [IU] | PEN_INJECTOR | Freq: Every day | SUBCUTANEOUS | 3 refills | Status: DC

## 2018-12-16 NOTE — Telephone Encounter (Signed)
P.A. Nelva Nay denied, pt must try at least 1 more of the formulary drugs which are Basaglar or Levemir.  Also pt has had issues with with frequent UTI's with certain diabetes medications.  Can pt be switched to either Basaglar or Levemir? Called & left message for pt

## 2018-12-16 NOTE — Telephone Encounter (Signed)
I sent Levemir given the insurance issue you listed.  This will replace Toujeo

## 2018-12-19 ENCOUNTER — Other Ambulatory Visit: Payer: Self-pay | Admitting: Medical

## 2018-12-19 ENCOUNTER — Telehealth: Payer: Self-pay | Admitting: Medical

## 2018-12-19 MED ORDER — "NEEDLE (DISP) 30G X 1/2"" MISC": 1.0000 | Freq: Every day | 11 refills | Status: DC

## 2018-12-19 NOTE — Telephone Encounter (Signed)
Called pharmacy & went thru for $3.90, called pt and informed  Also pharmacist says that all pen needles are interchangable

## 2018-12-19 NOTE — Telephone Encounter (Signed)
Needles sent

## 2018-12-19 NOTE — Telephone Encounter (Signed)
Pt called and said she needs new needles for levemir because they are different from the ones she used with Tougeo. She uses the [facility] in [place] on [address].

## 2018-12-20 DIAGNOSIS — E119 Type 2 diabetes mellitus without complications: Secondary | ICD-10-CM | POA: Diagnosis not present

## 2018-12-20 DIAGNOSIS — H401133 Primary open-angle glaucoma, bilateral, severe stage: Secondary | ICD-10-CM | POA: Diagnosis not present

## 2018-12-29 DIAGNOSIS — I10 Essential (primary) hypertension: Secondary | ICD-10-CM | POA: Diagnosis not present

## 2018-12-29 DIAGNOSIS — M5416 Radiculopathy, lumbar region: Secondary | ICD-10-CM | POA: Diagnosis not present

## 2018-12-29 DIAGNOSIS — M47812 Spondylosis without myelopathy or radiculopathy, cervical region: Secondary | ICD-10-CM | POA: Diagnosis not present

## 2018-12-29 DIAGNOSIS — Z9689 Presence of other specified functional implants: Secondary | ICD-10-CM | POA: Diagnosis not present

## 2018-12-29 DIAGNOSIS — Z6837 Body mass index (BMI) 37.0-37.9, adult: Secondary | ICD-10-CM | POA: Diagnosis not present

## 2018-12-29 DIAGNOSIS — M961 Postlaminectomy syndrome, not elsewhere classified: Secondary | ICD-10-CM | POA: Diagnosis not present

## 2019-01-04 ENCOUNTER — Other Ambulatory Visit: Payer: Self-pay | Admitting: Neurosurgery

## 2019-01-05 ENCOUNTER — Telehealth: Payer: Self-pay

## 2019-01-05 NOTE — Telephone Encounter (Signed)
   Primary Cardiologist: Minus Breeding, MD  Chart reviewed as part of pre-operative protocol coverage. Patient was contacted 01/05/2019 in reference to pre-operative risk assessment for pending surgery as outlined below.  Robyn Hill was last seen on 09/05/2018 by Dr. Percival Spanish.  Since that day, Robyn Hill has done well without palpitation, chest pain or shortness of breath. Patient never had prior h/o CAD.   Therefore, based on ACC/AHA guidelines, the patient would be at acceptable risk for the planned procedure without further cardiovascular testing.   I will route this recommendation to the requesting party via Epic fax function and remove from pre-op pool.  Please call with questions.  [place], [place] 01/05/2019, 10:03 AM

## 2019-01-05 NOTE — Telephone Encounter (Signed)
   [facility] Pre-operative Risk Assessment    Request for surgical clearance:  1. What type of surgery is being performed? Removal of spinal cord stimulator and implanted pulse generator   2. When is this surgery scheduled? 02-17-2019   3. What type of clearance is required (medical clearance vs. Pharmacy clearance to hold med vs. Both)? MEDICAL  4. Are there any medications that need to be held prior to surgery and how long?NOT LISTED   5. Practice name and name of physician performing surgery? [facility] ATTN: JESSICA   6. What is your office phone number  [phone]    7.   What is your office fax number  [phone]  8.   Anesthesia type (None, local, MAC, general) ? NOT LISTED   Waylan Rocher 01/05/2019, 7:06 AM  _________________________________________________________________   (provider comments below)

## 2019-01-25 ENCOUNTER — Ambulatory Visit: Payer: Medicare Other | Admitting: Medical

## 2019-02-20 ENCOUNTER — Telehealth: Payer: Self-pay | Admitting: Medical

## 2019-02-20 NOTE — Telephone Encounter (Signed)
Pt left message that the Levemir is causing her to itch terribly during the night and also her sugars are running 10 points higher since switching off the Toujeo.  I will try and get Toujeo approved since she is having issues with the Levemir. What do you want her to do in the meantime?

## 2019-02-20 NOTE — Telephone Encounter (Signed)
Where is she itching?  I do not think the insulin is causing her to itch since our body naturally produces insulin.  Please get a little bit more information on symptoms

## 2019-02-21 NOTE — Telephone Encounter (Signed)
Left message on voicemail for patient to call back. 

## 2019-02-22 NOTE — Telephone Encounter (Signed)
P.A. Nelva Nay approved, Audelia Acton can we switch back to Toujeo since sugars were much more controlled

## 2019-02-23 ENCOUNTER — Other Ambulatory Visit: Payer: Self-pay | Admitting: Medical

## 2019-02-23 DIAGNOSIS — M5416 Radiculopathy, lumbar region: Secondary | ICD-10-CM | POA: Diagnosis not present

## 2019-02-23 DIAGNOSIS — G894 Chronic pain syndrome: Secondary | ICD-10-CM | POA: Diagnosis not present

## 2019-02-23 DIAGNOSIS — M961 Postlaminectomy syndrome, not elsewhere classified: Secondary | ICD-10-CM | POA: Diagnosis not present

## 2019-02-23 DIAGNOSIS — M48061 Spinal stenosis, lumbar region without neurogenic claudication: Secondary | ICD-10-CM | POA: Diagnosis not present

## 2019-02-23 DIAGNOSIS — T85193A Other mechanical complication of implanted electronic neurostimulator, generator, initial encounter: Secondary | ICD-10-CM | POA: Diagnosis not present

## 2019-02-23 MED ORDER — INSULIN GLARGINE (1 UNIT DIAL) 300 UNIT/ML ~~LOC~~ SOPN: 16.0000 [IU] | PEN_INJECTOR | Freq: Every day | SUBCUTANEOUS | 2 refills | Status: DC

## 2019-02-23 NOTE — Telephone Encounter (Signed)
I sent the Toujeo so when she runs out of Levemir she can switch back to Toujeo now that we got it covered

## 2019-02-23 NOTE — Telephone Encounter (Signed)
Left message on voicemail notify patient that her toujeo was sent to pharmacy.

## 2019-03-06 ENCOUNTER — Other Ambulatory Visit: Payer: Self-pay | Admitting: Medical

## 2019-03-06 DIAGNOSIS — E119 Type 2 diabetes mellitus without complications: Secondary | ICD-10-CM

## 2019-03-15 ENCOUNTER — Encounter: Payer: Self-pay | Admitting: Medical

## 2019-03-15 ENCOUNTER — Ambulatory Visit (INDEPENDENT_AMBULATORY_CARE_PROVIDER_SITE_OTHER): Payer: Medicare Other | Admitting: Medical

## 2019-03-15 ENCOUNTER — Other Ambulatory Visit: Payer: Self-pay

## 2019-03-15 VITALS — BP 126/74 | HR 67 | Temp 97.3°F | Resp 16 | Ht 65.0 in | Wt 204.6 lb

## 2019-03-15 DIAGNOSIS — E785 Hyperlipidemia, unspecified: Secondary | ICD-10-CM | POA: Diagnosis not present

## 2019-03-15 DIAGNOSIS — D638 Anemia in other chronic diseases classified elsewhere: Secondary | ICD-10-CM

## 2019-03-15 DIAGNOSIS — R5382 Chronic fatigue, unspecified: Secondary | ICD-10-CM | POA: Diagnosis not present

## 2019-03-15 DIAGNOSIS — H409 Unspecified glaucoma: Secondary | ICD-10-CM | POA: Diagnosis not present

## 2019-03-15 DIAGNOSIS — M5442 Lumbago with sciatica, left side: Secondary | ICD-10-CM

## 2019-03-15 DIAGNOSIS — I10 Essential (primary) hypertension: Secondary | ICD-10-CM | POA: Diagnosis not present

## 2019-03-15 DIAGNOSIS — Z8639 Personal history of other endocrine, nutritional and metabolic disease: Secondary | ICD-10-CM

## 2019-03-15 DIAGNOSIS — E118 Type 2 diabetes mellitus with unspecified complications: Secondary | ICD-10-CM

## 2019-03-15 DIAGNOSIS — G609 Hereditary and idiopathic neuropathy, unspecified: Secondary | ICD-10-CM | POA: Diagnosis not present

## 2019-03-15 DIAGNOSIS — E1142 Type 2 diabetes mellitus with diabetic polyneuropathy: Secondary | ICD-10-CM | POA: Diagnosis not present

## 2019-03-15 DIAGNOSIS — G8929 Other chronic pain: Secondary | ICD-10-CM | POA: Diagnosis not present

## 2019-03-15 DIAGNOSIS — M1990 Unspecified osteoarthritis, unspecified site: Secondary | ICD-10-CM | POA: Diagnosis not present

## 2019-03-15 NOTE — Progress Notes (Signed)
Subjective: Chief Complaint  Patient presents with  . dm    dm check fasting sugar running better   Here for med check  Having loose stools for several weeks 2-4 loose stools daily. No fever, no blood in stool.   No sick contacts, but husband has same symptoms, but his started after colchicine was added by rheumatology.   No exposure to c diff, no recent antibiotics, no recent travel or visiting nursing homes or hospital  Diabetes - glucose improving now that she is back on Toujeo 16-18 units daily compared to the short term change to levemir due to insurance denial of Toujego.   Compliant with weekly bydureon.  checking glucose, no new foot concerns.  Has chronic neuropathy that doesn't seem to improve.   Still taking Metformin 1000mg  BID  HTN - compliant with medications  hyperlipemia - compliant with medications  Chronic pain - has follow up today with Dr. Vertell Limber, neurosurgery  Still stays fatigued a lot.      Past Medical History:  Diagnosis Date  . Adrenal mass, right ([place])   . Allergy   . Anemia   . Asthma    "as a teenager"  . Blind right eye    since childhood  . Blood transfusion 1970  . Chronic back pain   . Chronic diarrhea    Colestipol therapy  . Depression   . Diverticulitis   . Diverticulosis   . Fatty liver   . Female bladder prolapse   . GERD (gastroesophageal reflux disease)   . Glaucoma   . H/O bone density study 11/29/2014   normal study although mild decreased in density from prior study  . H/O hiatal hernia   . Hemorrhoids   . History of kidney stones   . History of uterine cancer    s/p hysterectomy  . Hyperlipidemia   . Hypertension   . Migraine    hx of migraines in past   . Neuromuscular disorder ([place])    diabetic neuropahthy  . Numbness and tingling of both legs   . Osteoarthritis   . Rheumatic fever    age 62  . Type II diabetes mellitus ([place])   . Urinary incontinence   . Vitamin D deficiency    Current Outpatient Medications on  File Prior to Visit  Medication Sig Dispense Refill  . ACCU-CHEK SOFTCLIX LANCETS lancets TEST DAILY 100 each 5  . atorvastatin (LIPITOR) 40 MG tablet TAKE 1 TABLET BY MOUTH ONCE DAILY AT BEDTIME 90 tablet 1  . baclofen (LIORESAL) 10 MG tablet Take 0.5 tablets (5 mg total) by mouth at bedtime. 45 each 1  . Blood Glucose Calibration (ACCU-CHEK INSTANT CONTROL) LIQD Use for controls for accu-chek activa meter 1 each 0  . brimonidine (ALPHAGAN) 0.2 % ophthalmic solution Place 1 drop into the left eye 2 times daily.    . Celecoxib (CELEBREX PO) Take 200 mg by mouth 2 (two) times daily.    . cholecalciferol (VITAMIN D) 1000 units tablet Take 1 tablet (1,000 Units total) by mouth daily. 90 tablet 3  . dexlansoprazole (DEXILANT) 60 MG capsule Take 1 capsule (60 mg total) by mouth 2 (two) times daily. 180 capsule 3  . dorzolamide-timolol (COSOPT) 22.3-6.8 MG/ML ophthalmic solution Place 1 drop into both eyes 2 (two) times daily.    . DULoxetine (CYMBALTA) 60 MG capsule TAKE 1 CAPSULE BY MOUTH ONCE DAILY 90 capsule 3  . enalapril (VASOTEC) 20 MG tablet Take 1 tablet (20 mg total) by mouth  daily. 90 tablet 3  . Exenatide ER (BYDUREON BCISE) 2 MG/0.85ML AUIJ Inject 2 mg into the skin once a week. 15 mL 11  . ferrous gluconate (FERGON) 324 MG tablet TAKE 1 TABLET BY MOUTH ONCE DAILY WITH BREAKFAST 90 tablet 1  . glucose blood test strip USE TO CHECK GLUCOSE TWICE DAILY 150 each 3  . hydrochlorothiazide (HYDRODIURIL) 25 MG tablet Take 1 tablet (25 mg total) by mouth daily. 90 tablet 3  . HYDROcodone-acetaminophen (NORCO) 10-325 MG tablet 1 tablet every 6 (six) hours.    . hydrocortisone (PROCTOSOL HC) 2.5 % rectal cream Place 1 application rectally 2 (two) times daily. 30 g 2  . Insulin Glargine, 1 Unit Dial, (TOUJEO SOLOSTAR) 300 UNIT/ML SOPN Inject 16 Units into the skin daily. 3 mL 2  . Insulin Pen Needle (B-D UF III MINI PEN NEEDLES) 31G X 5 MM MISC USE TO INJECT TOUJEO 100 each 11  . latanoprost  (XALATAN) 0.005 % ophthalmic solution Place 1 drop into the right eye at bedtime.    . levETIRAcetam (KEPPRA) 250 MG tablet Take 250 mg by mouth daily.    . metFORMIN (GLUCOPHAGE) 1000 MG tablet BID 180 tablet 3  . NEEDLE, DISP, 30 G (BD DISP NEEDLES) 30G X 1/2" MISC 1 each by Does not apply route daily. 50 each 11  . triamcinolone cream (KENALOG) 0.1 % Apply 1 application topically as needed (for skin irritation). Reported on 04/24/2016     No current facility-administered medications on file prior to visit.    ROS as in subjective   Objective: BP 126/74   Pulse 67   Temp (!) 97.3 F (36.3 C) (Temporal)   Resp 16   Ht 5\' 5"  (1.651 m)   Wt 204 lb 9.6 oz (92.8 kg)   SpO2 95%   BMI 34.05 kg/m   Gen: wd, wn, nad Lungs clear Heart rrr, normal s1, s2, no murmurs Ext: no obvious edema Skin unremarkable Pulses 1+ throughout Slow to get up and down off exam table due to arthritis and chronic pains Abdomen: normal bs, soft, nontender, no mass    Assessment: Encounter Diagnoses  Name Primary?  . Diabetes mellitus with complication ([place]) Yes  . Essential hypertension, benign   . Idiopathic peripheral neuropathy   . Hyperlipidemia, unspecified hyperlipidemia type   . Diabetic polyneuropathy associated with type 2 diabetes mellitus ([place])   . Arthritis   . Anemia of chronic disease   . Chronic bilateral low back pain with left-sided sciatica   . Glaucoma, unspecified glaucoma type, unspecified laterality   . History of thyroid disease   . Chronic fatigue      Plan: Diarrhea - possibly metformin related, less likely infection but can't rule out.    Pending labs consider increasing Toujeo and reducing dose of metformin temporarily.  C/t good hydration  Routine labs as below  Hyperlipidemia - c/t statin Lipitor 40mg  daily  HTN - c/t same medication, HCTZ 25mg  daily, enalapril 20mg  daily  diabetes - c/t metformin but consider change pending labs givne diarrhea.  C/t Toujeo 18  u nightly, c/t Byduroen weekly  Anemia - c/t iron 324mg  daily  chronic pain - on narcotics and NSAID and Keppra thorugh neurosurgery/pain management  glaucoma - managed by eye doctor  C/t rest of medications as usual   Neuropathy - continues on cymbalta, keppra  Fatigue  - could be multifactoral.   Sleep study negative 2017 for sleep apnea but did show periodic limb movement.  Reviewed 2017 nuclear stress test.  She has moderate carotid stenosis and has yearly f/u with cardiology.    Labs today.     Robyn Hill was seen today for dm.  Diagnoses and all orders for this visit:  Diabetes mellitus with complication ([place]) -     Hemoglobin A1c -     Comprehensive metabolic panel -     CBC -     Microalbumin/Creatinine Ratio, Urine  Essential hypertension, benign -     CBC -     Microalbumin/Creatinine Ratio, Urine  Idiopathic peripheral neuropathy -     Hemoglobin A1c -     CBC  Hyperlipidemia, unspecified hyperlipidemia type  Diabetic polyneuropathy associated with type 2 diabetes mellitus ([facility])  Arthritis  Anemia of chronic disease  Chronic bilateral low back pain with left-sided sciatica  Glaucoma, unspecified glaucoma type, unspecified laterality  History of thyroid disease  Chronic fatigue

## 2019-03-16 ENCOUNTER — Other Ambulatory Visit: Payer: Self-pay | Admitting: Medical

## 2019-03-16 LAB — COMPREHENSIVE METABOLIC PANEL
ALT: 22 IU/L (ref 0–32)
AST: 20 IU/L (ref 0–40)
Albumin/Globulin Ratio: 2.1 (ref 1.2–2.2)
Albumin: 4.5 g/dL (ref 3.7–4.7)
Alkaline Phosphatase: 87 IU/L (ref 39–117)
BUN/Creatinine Ratio: 24 (ref 12–28)
BUN: 19 mg/dL (ref 8–27)
Bilirubin Total: 0.4 mg/dL (ref 0.0–1.2)
CO2: 23 mmol/L (ref 20–29)
Calcium: 10 mg/dL (ref 8.7–10.3)
Chloride: 104 mmol/L (ref 96–106)
Creatinine, Ser: 0.79 mg/dL (ref 0.57–1.00)
GFR calc Af Amer: 86 mL/min/{1.73_m2} (ref >59)
GFR calc non Af Amer: 75 mL/min/{1.73_m2} (ref >59)
Globulin, Total: 2.1 g/dL (ref 1.5–4.5)
Glucose: 112 mg/dL — ABNORMAL HIGH (ref 65–99)
Potassium: 4.9 mmol/L (ref 3.5–5.2)
Sodium: 144 mmol/L (ref 134–144)
Total Protein: 6.6 g/dL (ref 6.0–8.5)

## 2019-03-16 LAB — CBC
Hematocrit: 38.8 % (ref 34.0–46.6)
Hemoglobin: 12.6 g/dL (ref 11.1–15.9)
MCH: 27.4 pg (ref 26.6–33.0)
MCHC: 32.5 g/dL (ref 31.5–35.7)
MCV: 84 fL (ref 79–97)
Platelets: 345 10*3/uL (ref 150–450)
RBC: 4.6 x10E6/uL (ref 3.77–5.28)
RDW: 12 % (ref 11.7–15.4)
WBC: 9.5 10*3/uL (ref 3.4–10.8)

## 2019-03-16 LAB — HEMOGLOBIN A1C
Est. average glucose Bld gHb Est-mCnc: 137 mg/dL
Hgb A1c MFr Bld: 6.4 % — ABNORMAL HIGH (ref 4.8–5.6)

## 2019-03-16 LAB — MICROALBUMIN / CREATININE URINE RATIO
Creatinine, Urine: 92.9 mg/dL
Microalb/Creat Ratio: 3 mg/g creat (ref 0–29)
Microalbumin, Urine: 3.2 ug/mL

## 2019-03-16 MED ORDER — ATORVASTATIN CALCIUM 40 MG PO TABS: 40.0000 mg | ORAL_TABLET | Freq: Every day | ORAL | 3 refills | Status: DC

## 2019-03-16 MED ORDER — VITAMIN D 25 MCG (1000 UNIT) PO TABS: 1000.0000 [IU] | ORAL_TABLET | Freq: Every day | ORAL | 3 refills | Status: DC

## 2019-03-17 ENCOUNTER — Encounter (HOSPITAL_COMMUNITY): Admission: RE | Payer: Self-pay | Source: Home / Self Care

## 2019-03-17 ENCOUNTER — Ambulatory Visit (HOSPITAL_COMMUNITY): Admission: RE | Admit: 2019-03-17 | Payer: Medicare Other | Source: Home / Self Care | Admitting: Neurosurgery

## 2019-03-17 ENCOUNTER — Other Ambulatory Visit: Payer: Self-pay

## 2019-03-17 ENCOUNTER — Telehealth: Payer: Self-pay | Admitting: Medical

## 2019-03-17 DIAGNOSIS — E118 Type 2 diabetes mellitus with unspecified complications: Secondary | ICD-10-CM

## 2019-03-17 SURGERY — LUMBAR SPINAL CORD STIMULATOR REMOVAL
Anesthesia: General

## 2019-03-17 MED ORDER — ACCU-CHEK GUIDE ME W/DEVICE KIT: 1.0000 | PACK | 0 refills | Status: DC

## 2019-03-17 MED ORDER — GLUCOSE BLOOD VI STRP: ORAL_STRIP | 12 refills | Status: DC

## 2019-03-17 NOTE — Telephone Encounter (Signed)
See her email message regarding refill on test strips.  Call out refills on strips x 11

## 2019-03-20 NOTE — Telephone Encounter (Signed)
Already done

## 2019-03-23 ENCOUNTER — Other Ambulatory Visit: Payer: Self-pay

## 2019-03-23 DIAGNOSIS — E118 Type 2 diabetes mellitus with unspecified complications: Secondary | ICD-10-CM

## 2019-03-23 MED ORDER — ACCU-CHEK GUIDE ME W/DEVICE KIT: 1.0000 | PACK | Freq: Two times a day (BID) | 0 refills | Status: DC

## 2019-03-27 ENCOUNTER — Other Ambulatory Visit: Payer: Self-pay | Admitting: Medical

## 2019-03-27 DIAGNOSIS — E118 Type 2 diabetes mellitus with unspecified complications: Secondary | ICD-10-CM

## 2019-03-27 MED ORDER — GLUCOSE BLOOD VI STRP: ORAL_STRIP | 12 refills | Status: DC

## 2019-03-28 DIAGNOSIS — L570 Actinic keratosis: Secondary | ICD-10-CM | POA: Diagnosis not present

## 2019-03-28 DIAGNOSIS — L282 Other prurigo: Secondary | ICD-10-CM | POA: Diagnosis not present

## 2019-03-28 DIAGNOSIS — L0100 Impetigo, unspecified: Secondary | ICD-10-CM | POA: Diagnosis not present

## 2019-03-29 ENCOUNTER — Telehealth: Payer: Self-pay | Admitting: Family Medicine

## 2019-03-29 NOTE — Telephone Encounter (Signed)
Pt called and states pharmacy does not have order for meter. I called [facility] [phone] they are closed until 9 am.  Left message to order meter and strips.  We sent request on 03/23/19.  Advised pt of same.

## 2019-03-30 ENCOUNTER — Telehealth: Payer: Self-pay | Admitting: Medical

## 2019-03-30 MED ORDER — ACCU-CHEK SOFTCLIX LANCETS MISC: 5 refills | Status: DC

## 2019-03-30 NOTE — Telephone Encounter (Signed)
done

## 2019-03-30 NOTE — Telephone Encounter (Signed)
Pt called and requested lancets for her new meter system. System is a [employer]. Please send to [facility] in [place].

## 2019-04-03 ENCOUNTER — Other Ambulatory Visit: Payer: Self-pay | Admitting: Medical

## 2019-04-03 MED ORDER — ACCU-CHEK SOFTCLIX LANCETS MISC: 5 refills | Status: DC

## 2019-04-06 ENCOUNTER — Telehealth: Payer: Self-pay

## 2019-04-06 ENCOUNTER — Other Ambulatory Visit: Payer: Self-pay

## 2019-04-06 DIAGNOSIS — E118 Type 2 diabetes mellitus with unspecified complications: Secondary | ICD-10-CM

## 2019-04-06 MED ORDER — ACCU-CHEK FASTCLIX LANCETS MISC: 6 refills | Status: DC

## 2019-04-06 MED ORDER — ACCU-CHEK GUIDE VI STRP: ORAL_STRIP | 12 refills | Status: DC

## 2019-04-06 NOTE — Telephone Encounter (Signed)
Patient stated she has Accu check Guide and she needs lancets and strips for that particular brand. She stated we filled but sent her the wrong lancet and strips. She has a lancet pen with a drum and stated she needs she drum. Please advise.

## 2019-04-06 NOTE — Telephone Encounter (Signed)
Done sent in accu check guide strips and accu check fast click lancets

## 2019-04-18 DIAGNOSIS — M353 Polymyalgia rheumatica: Secondary | ICD-10-CM | POA: Diagnosis not present

## 2019-04-18 DIAGNOSIS — M961 Postlaminectomy syndrome, not elsewhere classified: Secondary | ICD-10-CM | POA: Diagnosis not present

## 2019-04-19 DIAGNOSIS — M353 Polymyalgia rheumatica: Secondary | ICD-10-CM | POA: Diagnosis not present

## 2019-04-20 ENCOUNTER — Telehealth: Payer: Self-pay

## 2019-04-20 NOTE — Telephone Encounter (Signed)
[facility] sent a fax requesting Fastclix lancets be sent to the pharmacy. It was sent to the pharmacy on 04/06/2019.

## 2019-04-25 DIAGNOSIS — E109 Type 1 diabetes mellitus without complications: Secondary | ICD-10-CM | POA: Diagnosis not present

## 2019-04-25 DIAGNOSIS — Z961 Presence of intraocular lens: Secondary | ICD-10-CM | POA: Diagnosis not present

## 2019-04-25 DIAGNOSIS — H5212 Myopia, left eye: Secondary | ICD-10-CM | POA: Diagnosis not present

## 2019-04-25 DIAGNOSIS — H401133 Primary open-angle glaucoma, bilateral, severe stage: Secondary | ICD-10-CM | POA: Diagnosis not present

## 2019-04-25 LAB — HM DIABETES EYE EXAM

## 2019-05-02 ENCOUNTER — Other Ambulatory Visit: Payer: Self-pay | Admitting: Neurology

## 2019-05-17 ENCOUNTER — Encounter: Payer: Self-pay | Admitting: Podiatry

## 2019-05-17 ENCOUNTER — Other Ambulatory Visit: Payer: Self-pay

## 2019-05-17 ENCOUNTER — Ambulatory Visit (INDEPENDENT_AMBULATORY_CARE_PROVIDER_SITE_OTHER): Payer: Medicare Other | Admitting: Podiatry

## 2019-05-17 VITALS — BP 156/81 | HR 72 | Temp 97.2°F

## 2019-05-17 DIAGNOSIS — M2042 Other hammer toe(s) (acquired), left foot: Secondary | ICD-10-CM | POA: Diagnosis not present

## 2019-05-17 DIAGNOSIS — B351 Tinea unguium: Secondary | ICD-10-CM

## 2019-05-17 DIAGNOSIS — M79675 Pain in left toe(s): Secondary | ICD-10-CM | POA: Diagnosis not present

## 2019-05-17 DIAGNOSIS — M2041 Other hammer toe(s) (acquired), right foot: Secondary | ICD-10-CM | POA: Diagnosis not present

## 2019-05-17 DIAGNOSIS — M79674 Pain in right toe(s): Secondary | ICD-10-CM

## 2019-05-17 DIAGNOSIS — L84 Corns and callosities: Secondary | ICD-10-CM

## 2019-05-17 DIAGNOSIS — E1142 Type 2 diabetes mellitus with diabetic polyneuropathy: Secondary | ICD-10-CM

## 2019-05-17 NOTE — Patient Instructions (Addendum)
Diabetes Mellitus and Foot Care Foot care is an important part of your health, especially when you have diabetes. Diabetes may cause you to have problems because of poor blood flow (circulation) to your feet and legs, which can cause your skin to:  Become thinner and drier.  Break more easily.  Heal more slowly.  Peel and crack. You may also have nerve damage (neuropathy) in your legs and feet, causing decreased feeling in them. This means that you may not notice minor injuries to your feet that could lead to more serious problems. Noticing and addressing any potential problems early is the best way to prevent future foot problems. How to care for your feet Foot hygiene  Wash your feet daily with warm water and mild soap. Do not use hot water. Then, pat your feet and the areas between your toes until they are completely dry. Do not soak your feet as this can dry your skin.  Trim your toenails straight across. Do not dig under them or around the cuticle. File the edges of your nails with an emery board or nail file.  Apply a moisturizing lotion or petroleum jelly to the skin on your feet and to dry, brittle toenails. Use lotion that does not contain alcohol and is unscented. Do not apply lotion between your toes. Shoes and socks  Wear clean socks or stockings every day. Make sure they are not too tight. Do not wear knee-high stockings since they may decrease blood flow to your legs.  Wear shoes that fit properly and have enough cushioning. Always look in your shoes before you put them on to be sure there are no objects inside.  To break in new shoes, wear them for just a few hours a day. This prevents injuries on your feet. Wounds, scrapes, corns, and calluses  Check your feet daily for blisters, cuts, bruises, sores, and redness. If you cannot see the bottom of your feet, use a mirror or ask someone for help.  Do not cut corns or calluses or try to remove them with medicine.  If you  find a minor scrape, cut, or break in the skin on your feet, keep it and the skin around it clean and dry. You may clean these areas with mild soap and water. Do not clean the area with peroxide, alcohol, or iodine.  If you have a wound, scrape, corn, or callus on your foot, look at it several times a day to make sure it is healing and not infected. Check for: ? Redness, swelling, or pain. ? Fluid or blood. ? Warmth. ? Pus or a bad smell. General instructions  Do not cross your legs. This may decrease blood flow to your feet.  Do not use heating pads or hot water bottles on your feet. They may burn your skin. If you have lost feeling in your feet or legs, you may not know this is happening until it is too late.  Protect your feet from hot and cold by wearing shoes, such as at the beach or on hot pavement.  Schedule a complete foot exam at least once a year (annually) or more often if you have foot problems. If you have foot problems, report any cuts, sores, or bruises to your health care provider immediately. Contact a health care provider if:  You have a medical condition that increases your risk of infection and you have any cuts, sores, or bruises on your feet.  You have an injury that is not   healing.  You have redness on your legs or feet.  You feel burning or tingling in your legs or feet.  You have pain or cramps in your legs and feet.  Your legs or feet are numb.  Your feet always feel cold.  You have pain around a toenail. Get help right away if:  You have a wound, scrape, corn, or callus on your foot and: ? You have pain, swelling, or redness that gets worse. ? You have fluid or blood coming from the wound, scrape, corn, or callus. ? Your wound, scrape, corn, or callus feels warm to the touch. ? You have pus or a bad smell coming from the wound, scrape, corn, or callus. ? You have a fever. ? You have a red line going up your leg. Summary  Check your feet every day  for cuts, sores, red spots, swelling, and blisters.  Moisturize feet and legs daily.  Wear shoes that fit properly and have enough cushioning.  If you have foot problems, report any cuts, sores, or bruises to your health care provider immediately.  Schedule a complete foot exam at least once a year (annually) or more often if you have foot problems. This information is not intended to replace advice given to you by your health care provider. Make sure you discuss any questions you have with your health care provider. Document Released: 10/02/2000 Document Revised: 11/17/2017 Document Reviewed: 11/06/2016 Elsevier Patient Education  [place] Toe  Hammer toe is a change in the shape (a deformity) of your toe. The deformity causes the middle joint of your toe to stay bent. This causes pain, especially when you are wearing shoes. Hammer toe starts gradually. At first, the toe can be straightened. Gradually over time, the deformity becomes stiff and permanent. Early treatments to keep the toe straight may relieve pain. As the deformity becomes stiff and permanent, surgery may be needed to straighten the toe. What are the causes? Hammer toe is caused by abnormal bending of the toe joint that is closest to your foot. It happens gradually over time. This pulls on the muscles and connections (tendons) of the toe joint, making them weak and stiff. It is often related to wearing shoes that are too short or narrow and do not let your toes straighten. What increases the risk? You may be at greater risk for hammer toe if you:  Are female.  Are older.  Wear shoes that are too small.  Wear high-heeled shoes that pinch your toes.  Are a Engineer, mining.  Have a second toe that is longer than your big toe (first toe).  Injure your foot or toe.  Have arthritis.  Have a family history of hammer toe.  Have a nerve or muscle disorder. What are the signs or symptoms? The main  symptoms of this condition are pain and deformity of the toe. The pain is worse when wearing shoes, walking, or running. Other symptoms may include:  Corns or calluses over the bent part of the toe or between the toes.  Redness and a burning feeling on the toe.  An open sore that forms on the top of the toe.  Not being able to straighten the toe. How is this diagnosed? This condition is diagnosed based on your symptoms and a physical exam. During the exam, your health care provider will try to straighten your toe to see how stiff the deformity is. You may also have tests, such as:  A  blood test to check for rheumatoid arthritis.  An X-ray to show how severe the deformity is. How is this treated? Treatment for this condition will depend on how stiff the deformity is. Surgery is often needed. However, sometimes a hammer toe can be straightened without surgery. Treatments that do not involve surgery include:  Taping the toe into a straightened position.  Using pads and cushions to protect the toe (orthotics).  Wearing shoes that provide enough room for the toes.  Doing toe-stretching exercises at home.  Taking an NSAID to reduce pain and swelling. If these treatments do not help or the toe cannot be straightened, surgery is the next option. The most common surgeries used to straighten a hammer toe include:  Arthroplasty. In this procedure, part of the joint is removed, and that allows the toe to straighten.  Fusion. In this procedure, cartilage between the two bones of the joint is taken out and the bones are fused together into one longer bone.  Implantation. In this procedure, part of the bone is removed and replaced with an implant to let the toe move again.  Flexor tendon transfer. In this procedure, the tendons that curl the toes down (flexor tendons) are repositioned. Follow these instructions at home:  Take over-the-counter and prescription medicines only as told by your  health care provider.  Do toe straightening and stretching exercises as told by your health care provider.  Keep all follow-up visits as told by your health care provider. This is important. How is this prevented?  Wear shoes that give your toes enough room and do not cause pain.  Do not wear high-heeled shoes. Contact a health care provider if:  Your pain gets worse.  Your toe becomes red or swollen.  You develop an open sore on your toe. This information is not intended to replace advice given to you by your health care provider. Make sure you discuss any questions you have with your health care provider. Document Released: 10/02/2000 Document Revised: 09/17/2017 Document Reviewed: 01/29/2016 Elsevier Patient Education  2020 [employer].

## 2019-05-18 NOTE — Progress Notes (Signed)
Subjective: Robyn Hill presents today referred by Carlena Hurl, PA-C for diabetic foot evaluation.  Patient relates 40 year history of diabetes.  Patient denies any history of foot wounds.  Patient relates she has history of numbness and tingling in feet.   Today, patient c/o of painful, discolored, thick toenails which interfere with daily activities.  Pain is aggravated when wearing enclosed shoe gear.   Past Medical History:  Diagnosis Date  . Adrenal mass, right ([place])   . Allergy   . Anemia   . Asthma    "as a teenager"  . Blind right eye    since childhood  . Blood transfusion 1970  . Chronic back pain   . Chronic diarrhea    Colestipol therapy  . Depression   . Diverticulitis   . Diverticulosis   . Fatty liver   . Female bladder prolapse   . GERD (gastroesophageal reflux disease)   . Glaucoma   . H/O bone density study 11/29/2014   normal study although mild decreased in density from prior study  . H/O hiatal hernia   . Hemorrhoids   . History of kidney stones   . History of uterine cancer    s/p hysterectomy  . Hyperlipidemia   . Hypertension   . Migraine    hx of migraines in past   . Neuromuscular disorder ([place])    diabetic neuropahthy  . Numbness and tingling of both legs   . Osteoarthritis   . Rheumatic fever    age 73  . Type II diabetes mellitus ([place])   . Urinary incontinence   . Vitamin D deficiency     Patient Active Problem List   Diagnosis Date Noted  . Diabetic polyneuropathy associated with type 2 diabetes mellitus ([place]) 11/09/2018  . Hammer toes of both feet 11/09/2018  . Callus of foot 11/09/2018  . Decreased pedal pulses 11/09/2018  . Atrial fibrillation ([place]) 08/05/2018  . Vaccine counseling 03/31/2018  . Acute non-recurrent maxillary sinusitis 03/31/2018  . Post-nasal drainage 03/31/2018  . Abdominal pain 12/30/2017  . Dyspnea 03/08/2017  . Urinary tract infection without hematuria 11/27/2016  . Constipation due to  opioid therapy 09/21/2016  . History of back surgery 09/21/2016  . Nocturnal leg movements 08/26/2016  . Vitamin D deficiency 08/26/2016  . Periodic limb movement disorder 08/03/2016  . Chronic fatigue 08/03/2016  . Anemia of chronic disease 08/03/2016  . Post laminectomy syndrome 03/12/2016  . Sweating abnormality 02/19/2016  . Hemorrhoids 02/19/2016  . Gastroesophageal reflux disease without esophagitis 02/19/2016  . Chronic pain 10/22/2015  . History of thyroid disease 08/06/2015  . Need for influenza vaccination 08/06/2015  . Arthritis 04/18/2015  . Prolapse of female bladder, acquired 01/29/2015  . Iron deficiency anemia secondary to blood loss (chronic) 05/02/2014  . History of uterine cancer 05/02/2014  . Essential hypertension, benign 01/03/2014  . Chronic back pain 01/03/2014  . Glaucoma 01/03/2014  . Leukocytosis 01/03/2014  . Spondylolisthesis of lumbar region 11/02/2013  . Diabetes mellitus with complication ([place]) 63/87/5643  . Lipoma 11/25/2011  . Hyperlipidemia 02/23/2011    Past Surgical History:  Procedure Laterality Date  . ABDOMINAL HYSTERECTOMY  1980   no further pap smears needed  . ANTERIOR AND POSTERIOR REPAIR N/A 01/29/2015   Procedure: CYSTOCELE AND RECTOCELE ;  Surgeon: Bjorn Loser, MD;  Location: [facility];  Service: Urology;  Laterality: N/A;  . BACK SURGERY    . BREAST BIOPSY  08/1997   right  . CARDIAC CATHETERIZATION  11/2006  . CHOLECYSTECTOMY    . COLONOSCOPY  2014   polyps, external hemorrhoids  . CYSTOSCOPY N/A 01/29/2015   Procedure: CYSTOSCOPY;  Surgeon: Bjorn Loser, MD;  Location: [facility];  Service: Urology;  Laterality: N/A;  . DILATION AND CURETTAGE OF UTERUS  1980  . EDSI for back pain  last 01/2009   multiple x 5  . EYE SURGERY     Bil Blind R eye  . Stem  . LIVER BIOPSY  04/1999  . minor laceration repair     right eye  . POSTERIOR FUSION LUMBAR SPINE  04/05/12  . PULSE GENERATOR IMPLANT Right  03/12/2016   Procedure: Right Reposition of implantable pulse generator;  Surgeon: Erline Levine, MD;  Location: [facility];  Service: Neurosurgery;  Laterality: Right;  Right Reposition of implantable pulse generator  . SPINAL CORD STIMULATOR INSERTION N/A 09/06/2015   Procedure: LUMBAR SPINAL CORD STIMULATOR INSERTION;  Surgeon: Clydell Hakim, MD;  Location: [place];  Service: Neurosurgery;  Laterality: N/A;  Spinal Cord Stimulator placement  . SPINAL CORD STIMULATOR INSERTION N/A 10/22/2015   Procedure: Laminectomy for spinal cord stimulator paddle lead and implantable generator placement;  Surgeon: Erline Levine, MD;  Location: [facility];  Service: Neurosurgery;  Laterality: N/A;  Laminectomy for spinal cord stimulator paddle lead and implantable generator placement  . steroid injections of hip  last 02/2009   multiple x 4  . TONSILLECTOMY AND ADENOIDECTOMY  1952  . TRABECULECTOMY  1996   bilaterally; "for glaucoma"  . UPPER GI ENDOSCOPY  05/21/14   food, retained in the body of the stomach  . VAGINAL PROLAPSE REPAIR N/A 01/29/2015   Procedure:  VAULT PROLAPSE WITH GRAFT ;  Surgeon: Bjorn Loser, MD;  Location: [facility];  Service: Urology;  Laterality: N/A;     Current Outpatient Medications:  .  Accu-Chek FastClix Lancets MISC, Check sugar twice daily., Disp: 102 each, Rfl: 6 .  atorvastatin (LIPITOR) 40 MG tablet, Take 1 tablet (40 mg total) by mouth at bedtime., Disp: 90 tablet, Rfl: 3 .  baclofen (LIORESAL) 10 MG tablet, TAKE 1/2 (ONE-HALF) TABLET BY MOUTH AT BEDTIME, Disp: 45 tablet, Rfl: 0 .  Blood Glucose Calibration (ACCU-CHEK INSTANT CONTROL) LIQD, Use for controls for accu-chek activa meter, Disp: 1 each, Rfl: 0 .  Blood Glucose Monitoring Suppl (ACCU-CHEK GUIDE ME) w/Device KIT, 1 Device by Does not apply route 2 (two) times daily at 8 am and 10 pm., Disp: 1 kit, Rfl: 0 .  brimonidine (ALPHAGAN) 0.2 % ophthalmic solution, Place 1 drop into the left eye 2 times daily., Disp: ,  Rfl:  .  Celecoxib (CELEBREX PO), Take 200 mg by mouth 2 (two) times daily., Disp: , Rfl:  .  celecoxib (CELEBREX) 200 MG capsule, Take 200 mg by mouth 2 (two) times daily., Disp: , Rfl:  .  Cholecalciferol (VITAMIN D-3) 25 MCG (1000 UT) CAPS, Take 1 capsule by mouth daily., Disp: , Rfl:  .  cholecalciferol (VITAMIN D3) 25 MCG (1000 UT) tablet, Take 1 tablet (1,000 Units total) by mouth daily., Disp: 90 tablet, Rfl: 3 .  dexlansoprazole (DEXILANT) 60 MG capsule, Take 1 capsule (60 mg total) by mouth 2 (two) times daily., Disp: 180 capsule, Rfl: 3 .  dorzolamide-timolol (COSOPT) 22.3-6.8 MG/ML ophthalmic solution, Place 1 drop into both eyes 2 (two) times daily., Disp: , Rfl:  .  DULoxetine (CYMBALTA) 60 MG capsule, TAKE 1 CAPSULE BY MOUTH ONCE DAILY, Disp: 90 capsule,  Rfl: 3 .  enalapril (VASOTEC) 20 MG tablet, Take 1 tablet (20 mg total) by mouth daily., Disp: 90 tablet, Rfl: 3 .  Exenatide ER (BYDUREON BCISE) 2 MG/0.85ML AUIJ, Inject 2 mg into the skin once a week., Disp: 15 mL, Rfl: 11 .  ferrous gluconate (FERGON) 324 MG tablet, TAKE 1 TABLET BY MOUTH ONCE DAILY WITH BREAKFAST, Disp: 90 tablet, Rfl: 1 .  glucose blood (ACCU-CHEK GUIDE) test strip, Use as instructed, check once to twice daily, Disp: 100 each, Rfl: 12 .  hydrochlorothiazide (HYDRODIURIL) 25 MG tablet, Take 1 tablet (25 mg total) by mouth daily., Disp: 90 tablet, Rfl: 3 .  HYDROcodone-acetaminophen (NORCO) 10-325 MG tablet, 1 tablet every 6 (six) hours., Disp: , Rfl:  .  hydrocortisone (PROCTOSOL HC) 2.5 % rectal cream, Place 1 application rectally 2 (two) times daily., Disp: 30 g, Rfl: 2 .  Insulin Glargine, 1 Unit Dial, (TOUJEO SOLOSTAR) 300 UNIT/ML SOPN, Inject 16 Units into the skin daily., Disp: 3 mL, Rfl: 2 .  Insulin Pen Needle (B-D UF III MINI PEN NEEDLES) 31G X 5 MM MISC, USE TO INJECT TOUJEO, Disp: 100 each, Rfl: 11 .  latanoprost (XALATAN) 0.005 % ophthalmic solution, Place 1 drop into the right eye at bedtime., Disp: ,  Rfl:  .  levETIRAcetam (KEPPRA) 250 MG tablet, Take 250 mg by mouth daily., Disp: , Rfl:  .  metFORMIN (GLUCOPHAGE) 1000 MG tablet, BID, Disp: 180 tablet, Rfl: 3 .  mupirocin ointment (BACTROBAN) 2 %, APPLY OINTMENT TOPICALLY TWICE DAILY TO OPEN SKIN LESIONS, Disp: , Rfl:  .  NEEDLE, DISP, 30 G (BD DISP NEEDLES) 30G X 1/2" MISC, 1 each by Does not apply route daily., Disp: 50 each, Rfl: 11 .  predniSONE (DELTASONE) 5 MG tablet, Take 5 mg by mouth daily., Disp: , Rfl:  .  PROCTOZONE-HC 2.5 % rectal cream, APPLY RECTALLY TWICE DAILY, Disp: , Rfl:  .  triamcinolone cream (KENALOG) 0.1 %, Apply 1 application topically as needed (for skin irritation). Reported on 04/24/2016, Disp: , Rfl:   Allergies  Allergen Reactions  . Kiwi Extract Hives  . Lyrica [Pregabalin] Other (See Comments)    psychosis  . Sunflowerseed Oil Hives  . Hydrocodone-Acetaminophen Nausea Only    "Extended release form only"  . Invokana [Canagliflozin] Other (See Comments)    UTI  . Neurontin [Gabapentin] Other (See Comments)    Intolerance/ineffective  . Nitrofuran Derivatives Nausea Only and Other (See Comments)    stomach pain and weight loss  . Tape Other (See Comments)    Pulls skin off, can use paper tape    Social History   Occupational History  . Occupation: Retired   Tobacco Use  . Smoking status: Never Smoker  . Smokeless tobacco: Never Used  Substance and Sexual Activity  . Alcohol use: Yes    Comment: rare  . Drug use: No  . Sexual activity: Never    Comment: married    Family History  Problem Relation Age of Onset  . Heart attack Brother 58  . Hypertension Other   . Diabetes Other   . Cancer Mother 58  . Aplastic anemia Mother 58  . Heart attack Brother 25  . Colon cancer Brother   . Heart attack Sister   . Heart attack Brother 22  . Anesthesia problems Neg Hx     Immunization History  Administered Date(s) Administered  . Hepatitis B, adult 06/23/2013, 07/24/2013, 04/05/2014  .  Influenza Split 08/22/2007, 07/22/2011, 08/12/2012  .  Influenza, High Dose Seasonal PF 07/26/2014, 08/06/2015, 08/03/2016, 08/12/2017, 08/05/2018  . Influenza,inj,Quad PF,6+ Mos 07/24/2013  . Pneumococcal Conjugate-13 04/05/2014  . Pneumococcal Polysaccharide-23 08/25/2007, 10/20/2007, 04/04/2008, 08/08/2008, 03/31/2018  . Tdap 10/19/2005, 08/22/2007  . Zoster 10/20/2007, 04/04/2008    Review of systems: Positive Findings in bold print.  Constitutional:  chills, fatigue, fever, sweats, weight change Communication: Optometrist, sign Ecologist, hand writing, iPad/Android device Head: headaches, head injury Eyes: changes in vision, eye pain, glaucoma, cataracts, macular degeneration, diplopia, glare,  light sensitivity, eyeglasses or contacts, blindness Ears nose mouth throat: hearing impaired, hearing aids,  ringing in ears, deaf, sign language,  vertigo, nosebleeds,  rhinitis,  cold sores, snoring, swollen glands Cardiovascular: HTN, edema, arrhythmia, pacemaker in place, defibrillator in place, chest pain/tightness, chronic anticoagulation, blood clot, heart failure, MI Peripheral Vascular: leg cramps, varicose veins, blood clots, lymphedema, varicosities Respiratory:  difficulty breathing, denies congestion, SOB, wheezing, cough, emphysema Gastrointestinal: change in appetite or weight, abdominal pain, constipation, diarrhea, nausea, vomiting, vomiting blood, change in bowel habits, abdominal pain, jaundice, rectal bleeding, hemorrhoids, GERD Genitourinary:  nocturia,  pain on urination, polyuria,  blood in urine, Foley catheter, urinary urgency, ESRD on hemodialysis Musculoskeletal: amputation, cramping, stiff joints, painful joints, decreased joint motion, fractures, OA, gout, hemiplegia, paraplegia, uses cane, wheelchair bound, uses walker, uses rollator Skin: +changes in toenails, color change, dryness, itching, mole changes,  rash, wound(s) Neurological: headaches, numbness in  feet, paresthesias in feet, burning in feet, fainting,  seizures, change in speech,  headaches, memory problems/poor historian, cerebral palsy, weakness, paralysis, CVA, TIA Endocrine: diabetes, hypothyroidism, hyperthyroidism,  goiter, dry mouth, flushing, heat intolerance,  cold intolerance,  excessive thirst, denies polyuria,  nocturia Hematological:  easy bleeding, excessive bleeding, easy bruising, enlarged lymph nodes, on long term blood thinner, history of past transusions Allergy/immunological:  hives, eczema, frequent infections, multiple drug allergies, seasonal allergies, transplant recipient, multiple food allergies Psychiatric:  anxiety, depression, mood disorder, suicidal ideations, hallucinations, insomnia  Objective: Vitals:   05/17/19 1425  BP: (!) 156/81  Pulse: 72  Temp: (!) 97.2 F (36.2 C)   Vascular Examination: Capillary refill time immediate x 10 digits.  Dorsalis pedis pulses palpable b/l.  Posterior tibial pulses palpable b/l.  Digital hair absent x 10 digits  Skin temperature gradient WNL b/l.  Dermatological Examination: Skin with normal turgor, texture and tone b/l.  Toenails 1-5 b/l discolored, thick, dystrophic with subungual debris and pain with palpation to nailbeds due to thickness of nails.Incurvated nailplate b/l great toes with tenderness to palpation. No erythema, no edema, no drainage noted.  Musculoskeletal: Muscle strength 5/5 to all LE muscle groups.  HAV with bunion b/l.  Hammertoes 2, 3 b/l.   Hyperkeratotic lesions submet head 5 b/l.  Neurological: Sensation diminished 2/5 b/l with 10 gram monofilament.  Vibratory sensation diminished b/l.  Assessment: 1. Painful onychomycosis toenails 1-5 b/l  2. Calluses submet head 5 b/l 3. NIDDM with neuropathy  Plan: 1. Discussed diabetic foot care principles. Literature dispensed on today. 2. Toenails 1-5 b/l were debrided in length and girth without iatrogenic  bleeding. 3. Calluses pared b/l feet utilizing sterile scalpel blade without incident. 4. Patient to continue soft, supportive shoe gear.  Per Medicare guidelines, patient's feet need to be evaluated by an MD/DO managing patient's diabetes and diabetic shoe certification form needs to be signed by the MD/DO. If patient's diabetes is being managed by an Endocrinologist, the Endocrinologist must evaluate patient's feet and sign the Medicare diabetic shoe certification form. 5. Patient to report any  pedal injuries to medical professional immediately. 6. Follow up 3 months.  7. Patient/POA to call should there be a concern in the interim.

## 2019-05-28 ENCOUNTER — Other Ambulatory Visit: Payer: Self-pay | Admitting: Medical

## 2019-06-12 ENCOUNTER — Telehealth: Payer: Self-pay | Admitting: Medical

## 2019-06-12 ENCOUNTER — Other Ambulatory Visit: Payer: Self-pay

## 2019-06-12 DIAGNOSIS — E118 Type 2 diabetes mellitus with unspecified complications: Secondary | ICD-10-CM

## 2019-06-12 MED ORDER — TOUJEO SOLOSTAR 300 UNIT/ML ~~LOC~~ SOPN: 16.0000 [IU] | PEN_INJECTOR | Freq: Every day | SUBCUTANEOUS | 2 refills | Status: DC

## 2019-06-12 MED ORDER — TOUJEO SOLOSTAR 300 UNIT/ML ~~LOC~~ SOPN: 22.0000 [IU] | PEN_INJECTOR | Freq: Every day | SUBCUTANEOUS | 2 refills | Status: DC

## 2019-06-12 NOTE — Telephone Encounter (Signed)
Sent into [facility] in [place]

## 2019-06-12 NOTE — Telephone Encounter (Signed)
Pt called and states that she needs a refill on her toujeo 22 units pt needs it sent to the [facility], [place]

## 2019-06-16 ENCOUNTER — Telehealth: Payer: Self-pay | Admitting: Podiatry

## 2019-06-16 NOTE — Telephone Encounter (Signed)
Pt returned my call and left a message for me to call her back.  I returned call and it went directly to vm and I explained that per medicare guidelines pt has to have an office visit/virtual visit with the md(Dr Redmond School) and to call me to discuss further next week.

## 2019-06-19 ENCOUNTER — Telehealth: Payer: Self-pay | Admitting: Medical

## 2019-06-19 NOTE — Telephone Encounter (Signed)
Robyn Hill called she and Robyn Hill saw Dr. Adah Perl at [facility] on 7/29. They got a call today that the both need diabetic shoes and was told that they have to come here to get paperwork completed. They told them that they had to see  Dr. Redmond School and that Dr. Redmond School had to sign the paperwork for diabetic shoes.  First, she does not have any paperwork, they did not give her anything.   Second, do they really need to see Dr. Redmond School since you are their normal provider. Would it not be best for you to see them and then you can get Dr. Redmond School to sign paperwork

## 2019-06-22 NOTE — Telephone Encounter (Signed)
We can review the paperwork and sign at next office visit, or if needed sooner, get me the forms.  No they don't need to come in and see Dr. Redmond School.  I can get him to cosign.

## 2019-06-27 NOTE — Telephone Encounter (Signed)
She does not have any paperwork

## 2019-06-29 NOTE — Telephone Encounter (Signed)
For now and for future reference let us get this straight  I called podiatry and they advised that since they do not treat his diabetes they do not complete the paperwork that goes along with diabetic shoe order, however they can recommend and order the shoes specifically.  Also per Medicare an MD has to sign the paperwork for these forms, no PA or NP.  So............ please ask the [facility] to have the medical supply store or pharmacy that is dispensing the shoes to send this there form about diabetic care that we can fill out

## 2019-07-10 ENCOUNTER — Telehealth: Payer: Self-pay | Admitting: Medical

## 2019-07-10 ENCOUNTER — Telehealth: Payer: Self-pay | Admitting: Podiatry

## 2019-07-10 NOTE — Telephone Encounter (Signed)
Pt left message  Friday 9.18.2020 stating her and her husband have an appt on 9.28.2020 and they need to get paperwork to take with them to the appt.  I returned call and left message for pt to call me to discuss further. We normally send paperwork once pt comes in to pick out the shoes and get measured. But I also need the md/do's name that they will be seeing.

## 2019-07-10 NOTE — Telephone Encounter (Signed)
Pt called and stated that her and her husband [facility] insurance will be faxing paperwork over to get diabetic shoes but the paper has to be filled out by an MD so they will be faxing it to you.

## 2019-07-17 ENCOUNTER — Encounter: Payer: Self-pay | Admitting: Medical

## 2019-07-17 ENCOUNTER — Ambulatory Visit (INDEPENDENT_AMBULATORY_CARE_PROVIDER_SITE_OTHER): Payer: Medicare Other | Admitting: Medical

## 2019-07-17 ENCOUNTER — Ambulatory Visit: Payer: Medicare Other | Admitting: Orthotics

## 2019-07-17 ENCOUNTER — Other Ambulatory Visit: Payer: Self-pay

## 2019-07-17 VITALS — BP 136/78 | HR 77 | Temp 97.7°F | Ht 65.0 in | Wt 205.0 lb

## 2019-07-17 DIAGNOSIS — E1142 Type 2 diabetes mellitus with diabetic polyneuropathy: Secondary | ICD-10-CM | POA: Diagnosis not present

## 2019-07-17 DIAGNOSIS — L84 Corns and callosities: Secondary | ICD-10-CM

## 2019-07-17 DIAGNOSIS — E118 Type 2 diabetes mellitus with unspecified complications: Secondary | ICD-10-CM

## 2019-07-17 DIAGNOSIS — R32 Unspecified urinary incontinence: Secondary | ICD-10-CM

## 2019-07-17 DIAGNOSIS — Z23 Encounter for immunization: Secondary | ICD-10-CM | POA: Diagnosis not present

## 2019-07-17 DIAGNOSIS — I10 Essential (primary) hypertension: Secondary | ICD-10-CM

## 2019-07-17 DIAGNOSIS — R0989 Other specified symptoms and signs involving the circulatory and respiratory systems: Secondary | ICD-10-CM | POA: Diagnosis not present

## 2019-07-17 DIAGNOSIS — M79675 Pain in left toe(s): Secondary | ICD-10-CM

## 2019-07-17 DIAGNOSIS — Z7185 Encounter for immunization safety counseling: Secondary | ICD-10-CM

## 2019-07-17 DIAGNOSIS — R34 Anuria and oliguria: Secondary | ICD-10-CM | POA: Diagnosis not present

## 2019-07-17 DIAGNOSIS — R197 Diarrhea, unspecified: Secondary | ICD-10-CM | POA: Insufficient documentation

## 2019-07-17 DIAGNOSIS — M79674 Pain in right toe(s): Secondary | ICD-10-CM

## 2019-07-17 DIAGNOSIS — Z7189 Other specified counseling: Secondary | ICD-10-CM

## 2019-07-17 DIAGNOSIS — E785 Hyperlipidemia, unspecified: Secondary | ICD-10-CM | POA: Diagnosis not present

## 2019-07-17 DIAGNOSIS — R42 Dizziness and giddiness: Secondary | ICD-10-CM | POA: Diagnosis not present

## 2019-07-17 DIAGNOSIS — B351 Tinea unguium: Secondary | ICD-10-CM

## 2019-07-17 LAB — POCT URINALYSIS DIP (PROADVANTAGE DEVICE)
Bilirubin, UA: NEGATIVE
Glucose, UA: NEGATIVE mg/dL
Ketones, POC UA: NEGATIVE mg/dL
Nitrite, UA: NEGATIVE
Specific Gravity, Urine: 1.015
Urobilinogen, Ur: NEGATIVE
pH, UA: 7 (ref 5.0–8.0)

## 2019-07-17 NOTE — Progress Notes (Signed)
Subjective: Chief Complaint  Patient presents with  . Diabetes  . Urinary Tract Infection    abdominal pressure/possibly caused by medication   Here for med check and acute issues.  She notes problems with chronic loose stool but worse in the last 2 weeks.  For past 2 weeks lots of diarrhea, but 1 day of reprieve from diarrhea.  None today so far.  Stools have been all watery, at one point 4-6 times per week.  Last few days improving.   Has had some loose stools chronic even since last visit, but last 2 weeks severe diarrhea.  No sick contacts.   No undercooked food.   No recent travel.  No blood in the stool other than occasionally with hemorrhoids.  Has been having some dizziness last week, fell one day.  Last week went to turn, felt dizzy, and fell into chair.  Dizziness would last most of the day, would be present getting out of the bed, out of the chair.   No chest pain, no palpations, no SOB.   No ringing in ears.  Hydration is good.  Has chronic numbness and neuropathy in the feet, some left numbness in the first 3 fingers but otherwise no new changes  Diabetes-Been checking glucose readings, numbers 120-130 fasting.   No low readings.  Compliant with Toujeo 22 units daily, metformin 1000 mg daily decreased from last visit Bydureon weekly.  No recent weight loss with all the diarrhea.  No new foot lesions.  No polyuria, polydipsia  Having some urinary problems.  In general feels decreased urine output.  Has incontinence, cannot hold the urine or control it sometimes.  This is a chronic problem but lately has gotten a little worse.  Not doing kegel exercise.    Here for discussion about diabetic shoes.  She sees [facility] and will be getting her shoes there.  Formally she got her shoes at a pharmacy up in [place], [place].  Wants her flu shot today.  Has history of hemorrhoids, uses steroid cream prescription periodically but not daily  Hyperlipidemia-compliant with  a atorvastatin 40 mg daily  Compliant with enalapril and HCTZ daily  She still takes iron ferrous gluconate regularly  Her pain control is monitored by pain specialist  No other aggravating or relieving factors. No other complaint.  Past Medical History:  Diagnosis Date  . Adrenal mass, right ([place])   . Allergy   . Anemia   . Asthma    "as a teenager"  . Blind right eye    since childhood  . Blood transfusion 1970  . Chronic back pain   . Chronic diarrhea    Colestipol therapy  . Depression   . Diverticulitis   . Diverticulosis   . Fatty liver   . Female bladder prolapse   . GERD (gastroesophageal reflux disease)   . Glaucoma   . H/O bone density study 11/29/2014   normal study although mild decreased in density from prior study  . H/O hiatal hernia   . Hemorrhoids   . History of kidney stones   . History of uterine cancer    s/p hysterectomy  . Hyperlipidemia   . Hypertension   . Migraine    hx of migraines in past   . Neuromuscular disorder ([place])    diabetic neuropahthy  . Numbness and tingling of both legs   . Osteoarthritis   . Rheumatic fever    age 39  . Type II diabetes  mellitus ([facility])   . Urinary incontinence   . Vitamin D deficiency    Current Outpatient Medications on File Prior to Visit  Medication Sig Dispense Refill  . Accu-Chek FastClix Lancets MISC Check sugar twice daily. 102 each 6  . atorvastatin (LIPITOR) 40 MG tablet Take 1 tablet (40 mg total) by mouth at bedtime. 90 tablet 3  . baclofen (LIORESAL) 10 MG tablet TAKE 1/2 (ONE-HALF) TABLET BY MOUTH AT BEDTIME 45 tablet 0  . Blood Glucose Calibration (ACCU-CHEK INSTANT CONTROL) LIQD Use for controls for accu-chek activa meter 1 each 0  . Blood Glucose Monitoring Suppl (ACCU-CHEK GUIDE ME) w/Device KIT 1 Device by Does not apply route 2 (two) times daily at 8 am and 10 pm. 1 kit 0  . brimonidine (ALPHAGAN) 0.2 % ophthalmic solution Place 1 drop into the left eye 2 times daily.    .  Cholecalciferol (VITAMIN D-3) 25 MCG (1000 UT) CAPS Take 1 capsule by mouth daily.    . dexlansoprazole (DEXILANT) 60 MG capsule Take 1 capsule (60 mg total) by mouth 2 (two) times daily. 180 capsule 3  . dorzolamide-timolol (COSOPT) 22.3-6.8 MG/ML ophthalmic solution Place 1 drop into both eyes 2 (two) times daily.    . DULoxetine (CYMBALTA) 60 MG capsule TAKE 1 CAPSULE BY MOUTH ONCE DAILY 90 capsule 3  . enalapril (VASOTEC) 20 MG tablet Take 1 tablet (20 mg total) by mouth daily. 90 tablet 3  . Exenatide ER (BYDUREON BCISE) 2 MG/0.85ML AUIJ Inject 2 mg into the skin once a week. 15 mL 11  . ferrous gluconate (FERGON) 324 MG tablet Take 1 tablet by mouth once daily with breakfast 90 tablet 0  . glucose blood (ACCU-CHEK GUIDE) test strip Use as instructed, check once to twice daily 100 each 12  . hydrochlorothiazide (HYDRODIURIL) 25 MG tablet Take 1 tablet (25 mg total) by mouth daily. 90 tablet 3  . HYDROcodone-acetaminophen (NORCO) 10-325 MG tablet 1 tablet every 6 (six) hours.    . hydrocortisone (PROCTOSOL HC) 2.5 % rectal cream Place 1 application rectally 2 (two) times daily. 30 g 2  . Insulin Glargine, 1 Unit Dial, (TOUJEO SOLOSTAR) 300 UNIT/ML SOPN Inject 22 Units into the skin daily. 3 mL 2  . Insulin Pen Needle (B-D UF III MINI PEN NEEDLES) 31G X 5 MM MISC USE TO INJECT TOUJEO 100 each 11  . latanoprost (XALATAN) 0.005 % ophthalmic solution Place 1 drop into the right eye at bedtime.    . levETIRAcetam (KEPPRA) 250 MG tablet Take 250 mg by mouth daily.    . metFORMIN (GLUCOPHAGE) 1000 MG tablet BID (Patient taking differently: Take 1,000 mg by mouth daily with breakfast. BID) 180 tablet 3  . mupirocin ointment (BACTROBAN) 2 % APPLY OINTMENT TOPICALLY TWICE DAILY TO OPEN SKIN LESIONS    . NEEDLE, DISP, 30 G (BD DISP NEEDLES) 30G X 1/2" MISC 1 each by Does not apply route daily. 50 each 11  . triamcinolone cream (KENALOG) 0.1 % Apply 1 application topically as needed (for skin irritation).  Reported on 04/24/2016    . PROCTOZONE-HC 2.5 % rectal cream APPLY RECTALLY TWICE DAILY     No current facility-administered medications on file prior to visit.      Objective: BP 136/78   Pulse 77   Temp 97.7 F (36.5 C)   Ht 5' 5" (1.651 m)   Wt 205 lb (93 kg)   SpO2 94%   BMI 34.11 kg/m   Wt Readings from Last 3   Encounters:  07/17/19 205 lb (93 kg)  03/15/19 204 lb 9.6 oz (92.8 kg)  11/22/18 199 lb 5 oz (90.4 kg)     General appearance: alert, no distress, WD/WN,  HEENT: normocephalic, sclerae anicteric, PERRLA, EOMi, nares patent, no discharge or erythema, pharynx normal Oral cavity: MMM, no lesions Neck: supple, no lymphadenopathy, no thyromegaly, no masses, no bruits Heart: RRR, normal S1, S2, no murmurs Lungs: CTA bilaterally, no wheezes, rhonchi, or rales Abdomen: +increased bs, soft, mild generalized lower abdominal tenderness, otherwise non tender, non distended, no masses, no hepatomegaly, no splenomegaly Extremities: 1+ bilat nonpitting LE edema, few scattered roundish spots of erythema and crusting suggestive of recent mild superficial ulcerations of lower legs due to chronic venous insuffiencey, otherwise no cyanosis, no clubbing Pulses: 2+ symmetric, upper extremities, normal cap refill Neurological: alert, oriented x 3, CN2-12 intact, strength normal upper extremities and lower extremities, sensation normal throughout, DTRs 2+ throughout, no cerebellar signs, gait normal Psychiatric: normal affect, behavior normal, pleasant   Diabetic Foot Exam - Simple   Simple Foot Form Diabetic Foot exam was performed with the following findings: Yes 07/17/2019  9:37 AM  Visual Inspection See comments: Yes Sensation Testing See comments: Yes Pulse Check See comments: Yes Comments 1+ pedal pulses bilaterally, decreased sensation throughout monofilament particular to toes although she has some sensation in the volar midfoot, there is a pre-ulcerative callus of the right  fifth toe laterally, decreased arches bilaterally      Assessment: Encounter Diagnoses  Name Primary?  . Diabetes mellitus with complication ([place]) Yes  . Dizziness   . Essential hypertension, benign   . Diarrhea, unspecified type   . Diabetic polyneuropathy associated with type 2 diabetes mellitus ([place])   . Need for influenza vaccination   . Hyperlipidemia, unspecified hyperlipidemia type   . Decreased pedal pulses   . Vaccine counseling   . Pre-ulcerative calluses   . Urinary incontinence, unspecified type   . Decreased urine output     Plan: Diabetes - labs as below, c/t glucometer testing, c/t daily foot checks.   Continue Toujeo 22 units daily, continue Bydureon weekly pen, consider lowering dose or stopping metformin given ongoing loose stools.  Diarrhea- her chronic loose stools are probably related to metformin, but she has had worse loose stools the last 2 weeks that sounds like a stomach virus although it is now improving.  We discussed importance of good hydration.  We discussed possible stool testing but she will hold off for now since the symptoms are improving.  Follow-up pending labs  Diabetic nephropathy, diabetes, pre-ulcerative callus -we discussed her medical conditions, that she may benefit from diabetic shoes as part of her diabetes care and prevention of ulceration.  She has consistently used diabetic shoes last several years.  Dr. Redmond School, supervising physician, reviewed the case as well and was involved in care decisions today.  Hyperlipidemia- continue atorvastatin 40 mg daily  Dizziness- may be vertigo, but labs today to help further evaluate the concerns.  She has known neuropathy which probably contributes to this.  Follow-up pending labs.  Counseled on the influenza virus vaccine.  Vaccine information sheet given.  Influenza vaccine given after consent obtained.  She is up-to-date on vaccines in general otherwise except for tetanus which they have declined  in the past due to lack of insurance coverage for this for preventative measure  Urinary incontinence, decreased urine output -labs today, and likely follow-up with your  Dorlene was seen today for diabetes and urinary tract  infection.  Diagnoses and all orders for this visit:  Diabetes mellitus with complication ([place]) -     Hemoglobin A1c -     Comprehensive metabolic panel -     TSH  Dizziness -     Iron -     CBC with Differential/Platelet -     TSH  Essential hypertension, benign -     Comprehensive metabolic panel -     TSH  Diarrhea, unspecified type -     Comprehensive metabolic panel -     CBC with Differential/Platelet  Diabetic polyneuropathy associated with type 2 diabetes mellitus ([facility]) -     TSH  Need for influenza vaccination -     Flu Vaccine QUAD High Dose(Fluad)  Hyperlipidemia, unspecified hyperlipidemia type -     Lipid panel  Decreased pedal pulses  Vaccine counseling  Pre-ulcerative calluses  Urinary incontinence, unspecified type  Decreased urine output

## 2019-07-17 NOTE — Progress Notes (Signed)

## 2019-07-18 ENCOUNTER — Other Ambulatory Visit: Payer: Self-pay | Admitting: Medical

## 2019-07-18 DIAGNOSIS — E118 Type 2 diabetes mellitus with unspecified complications: Secondary | ICD-10-CM

## 2019-07-18 LAB — LIPID PANEL
Chol/HDL Ratio: 2.6 ratio (ref 0.0–4.4)
Cholesterol, Total: 143 mg/dL (ref 100–199)
HDL: 54 mg/dL (ref >39)
LDL Chol Calc (NIH): 67 mg/dL (ref 0–99)
Triglycerides: 127 mg/dL (ref 0–149)
VLDL Cholesterol Cal: 22 mg/dL (ref 5–40)

## 2019-07-18 LAB — HEMOGLOBIN A1C
Est. average glucose Bld gHb Est-mCnc: 140 mg/dL
Hgb A1c MFr Bld: 6.5 % — ABNORMAL HIGH (ref 4.8–5.6)

## 2019-07-18 LAB — COMPREHENSIVE METABOLIC PANEL
ALT: 26 IU/L (ref 0–32)
AST: 19 IU/L (ref 0–40)
Albumin/Globulin Ratio: 1.9 (ref 1.2–2.2)
Albumin: 4.6 g/dL (ref 3.7–4.7)
Alkaline Phosphatase: 94 IU/L (ref 39–117)
BUN/Creatinine Ratio: 22 (ref 12–28)
BUN: 18 mg/dL (ref 8–27)
Bilirubin Total: 0.4 mg/dL (ref 0.0–1.2)
CO2: 22 mmol/L (ref 20–29)
Calcium: 9.5 mg/dL (ref 8.7–10.3)
Chloride: 99 mmol/L (ref 96–106)
Creatinine, Ser: 0.83 mg/dL (ref 0.57–1.00)
GFR calc Af Amer: 81 mL/min/{1.73_m2} (ref >59)
GFR calc non Af Amer: 70 mL/min/{1.73_m2} (ref >59)
Globulin, Total: 2.4 g/dL (ref 1.5–4.5)
Glucose: 130 mg/dL — ABNORMAL HIGH (ref 65–99)
Potassium: 4.7 mmol/L (ref 3.5–5.2)
Sodium: 138 mmol/L (ref 134–144)
Total Protein: 7 g/dL (ref 6.0–8.5)

## 2019-07-18 LAB — CBC WITH DIFFERENTIAL/PLATELET
Basophils Absolute: 0.1 10*3/uL (ref 0.0–0.2)
Basos: 1 %
EOS (ABSOLUTE): 0.4 10*3/uL (ref 0.0–0.4)
Eos: 3 %
Hematocrit: 41 % (ref 34.0–46.6)
Hemoglobin: 13.3 g/dL (ref 11.1–15.9)
Immature Grans (Abs): 0 10*3/uL (ref 0.0–0.1)
Immature Granulocytes: 0 %
Lymphocytes Absolute: 2.6 10*3/uL (ref 0.7–3.1)
Lymphs: 24 %
MCH: 27.5 pg (ref 26.6–33.0)
MCHC: 32.4 g/dL (ref 31.5–35.7)
MCV: 85 fL (ref 79–97)
Monocytes Absolute: 0.9 10*3/uL (ref 0.1–0.9)
Monocytes: 8 %
Neutrophils Absolute: 6.8 10*3/uL (ref 1.4–7.0)
Neutrophils: 64 %
Platelets: 347 10*3/uL (ref 150–450)
RBC: 4.84 x10E6/uL (ref 3.77–5.28)
RDW: 12 % (ref 11.7–15.4)
WBC: 10.7 10*3/uL (ref 3.4–10.8)

## 2019-07-18 LAB — IRON: Iron: 81 ug/dL (ref 27–139)

## 2019-07-18 LAB — TSH: TSH: 1.49 u[IU]/mL (ref 0.450–4.500)

## 2019-07-18 MED ORDER — MECLIZINE HCL 25 MG PO TABS: 25.0000 mg | ORAL_TABLET | Freq: Two times a day (BID) | ORAL | 0 refills | Status: DC

## 2019-07-18 MED ORDER — METFORMIN HCL 500 MG PO TABS: 500.0000 mg | ORAL_TABLET | Freq: Two times a day (BID) | ORAL | 1 refills | Status: DC

## 2019-07-18 MED ORDER — TOUJEO SOLOSTAR 300 UNIT/ML ~~LOC~~ SOPN: 22.0000 [IU] | PEN_INJECTOR | Freq: Every day | SUBCUTANEOUS | 6 refills | Status: DC

## 2019-07-18 MED ORDER — BYDUREON BCISE 2 MG/0.85ML ~~LOC~~ AUIJ: 2.0000 mg | AUTO-INJECTOR | SUBCUTANEOUS | 2 refills | Status: DC

## 2019-07-18 MED ORDER — VITAMIN D-3 25 MCG (1000 UT) PO CAPS: 1.0000 | ORAL_CAPSULE | Freq: Every day | ORAL | 3 refills | Status: DC

## 2019-07-19 ENCOUNTER — Other Ambulatory Visit: Payer: Self-pay

## 2019-07-19 DIAGNOSIS — Z20822 Contact with and (suspected) exposure to covid-19: Secondary | ICD-10-CM

## 2019-07-22 LAB — NOVEL CORONAVIRUS, NAA: SARS-CoV-2, NAA: NOT DETECTED

## 2019-08-04 ENCOUNTER — Other Ambulatory Visit: Payer: Self-pay

## 2019-08-04 DIAGNOSIS — Z20828 Contact with and (suspected) exposure to other viral communicable diseases: Secondary | ICD-10-CM | POA: Diagnosis not present

## 2019-08-04 DIAGNOSIS — Z20822 Contact with and (suspected) exposure to covid-19: Secondary | ICD-10-CM

## 2019-08-05 LAB — NOVEL CORONAVIRUS, NAA: SARS-CoV-2, NAA: NOT DETECTED

## 2019-08-08 ENCOUNTER — Telehealth: Payer: Self-pay | Admitting: Medical

## 2019-08-08 NOTE — Telephone Encounter (Signed)
Pt called and wanted to set up a antibody test for her and her husband on the 30th. Would that just be a lab visit? I told pt I would check with you and call her back tomorrow to set up the appt.

## 2019-08-09 ENCOUNTER — Other Ambulatory Visit: Payer: Self-pay | Admitting: Medical

## 2019-08-09 DIAGNOSIS — Z20822 Contact with and (suspected) exposure to covid-19: Secondary | ICD-10-CM

## 2019-08-09 DIAGNOSIS — Z20828 Contact with and (suspected) exposure to other viral communicable diseases: Secondary | ICD-10-CM

## 2019-08-09 NOTE — Telephone Encounter (Signed)
Orders are in for her and her husband for antibody test.  They can come back at their convenience for a lab visit

## 2019-08-19 ENCOUNTER — Other Ambulatory Visit: Payer: Self-pay | Admitting: Medical

## 2019-08-21 ENCOUNTER — Ambulatory Visit: Payer: Medicare Other | Admitting: Podiatry

## 2019-08-21 ENCOUNTER — Other Ambulatory Visit: Payer: Medicare Other

## 2019-08-21 ENCOUNTER — Other Ambulatory Visit: Payer: Self-pay | Admitting: Neurology

## 2019-08-23 ENCOUNTER — Other Ambulatory Visit: Payer: Self-pay

## 2019-08-23 ENCOUNTER — Ambulatory Visit (INDEPENDENT_AMBULATORY_CARE_PROVIDER_SITE_OTHER): Payer: Medicare Other | Admitting: Podiatry

## 2019-08-23 ENCOUNTER — Encounter: Payer: Self-pay | Admitting: Podiatry

## 2019-08-23 DIAGNOSIS — L84 Corns and callosities: Secondary | ICD-10-CM

## 2019-08-23 DIAGNOSIS — E1142 Type 2 diabetes mellitus with diabetic polyneuropathy: Secondary | ICD-10-CM

## 2019-08-23 DIAGNOSIS — M79674 Pain in right toe(s): Secondary | ICD-10-CM | POA: Diagnosis not present

## 2019-08-23 DIAGNOSIS — B351 Tinea unguium: Secondary | ICD-10-CM | POA: Diagnosis not present

## 2019-08-23 DIAGNOSIS — M79675 Pain in left toe(s): Secondary | ICD-10-CM | POA: Diagnosis not present

## 2019-08-23 NOTE — Patient Instructions (Signed)
Diabetes Mellitus and Foot Care Foot care is an important part of your health, especially when you have diabetes. Diabetes may cause you to have problems because of poor blood flow (circulation) to your feet and legs, which can cause your skin to:  Become thinner and drier.  Break more easily.  Heal more slowly.  Peel and crack. You may also have nerve damage (neuropathy) in your legs and feet, causing decreased feeling in them. This means that you may not notice minor injuries to your feet that could lead to more serious problems. Noticing and addressing any potential problems early is the best way to prevent future foot problems. How to care for your feet Foot hygiene  Wash your feet daily with warm water and mild soap. Do not use hot water. Then, pat your feet and the areas between your toes until they are completely dry. Do not soak your feet as this can dry your skin.  Trim your toenails straight across. Do not dig under them or around the cuticle. File the edges of your nails with an emery board or nail file.  Apply a moisturizing lotion or petroleum jelly to the skin on your feet and to dry, brittle toenails. Use lotion that does not contain alcohol and is unscented. Do not apply lotion between your toes. Shoes and socks  Wear clean socks or stockings every day. Make sure they are not too tight. Do not wear knee-high stockings since they may decrease blood flow to your legs.  Wear shoes that fit properly and have enough cushioning. Always look in your shoes before you put them on to be sure there are no objects inside.  To break in new shoes, wear them for just a few hours a day. This prevents injuries on your feet. Wounds, scrapes, corns, and calluses  Check your feet daily for blisters, cuts, bruises, sores, and redness. If you cannot see the bottom of your feet, use a mirror or ask someone for help.  Do not cut corns or calluses or try to remove them with medicine.  If you  find a minor scrape, cut, or break in the skin on your feet, keep it and the skin around it clean and dry. You may clean these areas with mild soap and water. Do not clean the area with peroxide, alcohol, or iodine.  If you have a wound, scrape, corn, or callus on your foot, look at it several times a day to make sure it is healing and not infected. Check for: ? Redness, swelling, or pain. ? Fluid or blood. ? Warmth. ? Pus or a bad smell. General instructions  Do not cross your legs. This may decrease blood flow to your feet.  Do not use heating pads or hot water bottles on your feet. They may burn your skin. If you have lost feeling in your feet or legs, you may not know this is happening until it is too late.  Protect your feet from hot and cold by wearing shoes, such as at the beach or on hot pavement.  Schedule a complete foot exam at least once a year (annually) or more often if you have foot problems. If you have foot problems, report any cuts, sores, or bruises to your health care provider immediately. Contact a health care provider if:  You have a medical condition that increases your risk of infection and you have any cuts, sores, or bruises on your feet.  You have an injury that is not   healing.  You have redness on your legs or feet.  You feel burning or tingling in your legs or feet.  You have pain or cramps in your legs and feet.  Your legs or feet are numb.  Your feet always feel cold.  You have pain around a toenail. Get help right away if:  You have a wound, scrape, corn, or callus on your foot and: ? You have pain, swelling, or redness that gets worse. ? You have fluid or blood coming from the wound, scrape, corn, or callus. ? Your wound, scrape, corn, or callus feels warm to the touch. ? You have pus or a bad smell coming from the wound, scrape, corn, or callus. ? You have a fever. ? You have a red line going up your leg. Summary  Check your feet every day  for cuts, sores, red spots, swelling, and blisters.  Moisturize feet and legs daily.  Wear shoes that fit properly and have enough cushioning.  If you have foot problems, report any cuts, sores, or bruises to your health care provider immediately.  Schedule a complete foot exam at least once a year (annually) or more often if you have foot problems. This information is not intended to replace advice given to you by your health care provider. Make sure you discuss any questions you have with your health care provider. Document Released: 10/02/2000 Document Revised: 11/17/2017 Document Reviewed: 11/06/2016 Elsevier Patient Education  2020 [employer]  

## 2019-08-24 ENCOUNTER — Other Ambulatory Visit: Payer: Self-pay | Admitting: Medical

## 2019-08-24 ENCOUNTER — Other Ambulatory Visit: Payer: Medicare Other

## 2019-08-24 DIAGNOSIS — Z20822 Contact with and (suspected) exposure to covid-19: Secondary | ICD-10-CM

## 2019-08-24 DIAGNOSIS — Z961 Presence of intraocular lens: Secondary | ICD-10-CM | POA: Diagnosis not present

## 2019-08-24 DIAGNOSIS — H401133 Primary open-angle glaucoma, bilateral, severe stage: Secondary | ICD-10-CM | POA: Diagnosis not present

## 2019-08-24 DIAGNOSIS — E119 Type 2 diabetes mellitus without complications: Secondary | ICD-10-CM | POA: Diagnosis not present

## 2019-08-24 DIAGNOSIS — Z20828 Contact with and (suspected) exposure to other viral communicable diseases: Secondary | ICD-10-CM | POA: Diagnosis not present

## 2019-08-24 NOTE — Progress Notes (Signed)
covco

## 2019-08-25 LAB — SAR COV2 SEROLOGY (COVID19)AB(IGG),IA

## 2019-08-25 LAB — EUROIMMUN SARS-COV-2 AB, IGG: Euroimmun SARS-CoV-2 Ab, IgG: NEGATIVE

## 2019-08-27 NOTE — Progress Notes (Signed)
Subjective: Robyn Hill is seen today for preventative diabetic foot care  follow up painful, elongated, thickened toenails 1-5 b/l feet that she cannot cut. Pain interferes with daily activities. Aggravating factor includes wearing enclosed shoe gear and relieved with periodic debridement.  She is also here to pick up her diabetic shoes on today.   She voices no new pedal problems on today's visit.   Current Outpatient Medications on File Prior to Visit  Medication Sig  . Accu-Chek FastClix Lancets MISC Check sugar twice daily.  Marland Kitchen atorvastatin (LIPITOR) 40 MG tablet Take 1 tablet (40 mg total) by mouth at bedtime.  . baclofen (LIORESAL) 10 MG tablet TAKE 1/2 (ONE-HALF) TABLET BY MOUTH AT BEDTIME  . Blood Glucose Calibration (ACCU-CHEK INSTANT CONTROL) LIQD Use for controls for accu-chek activa meter  . Blood Glucose Monitoring Suppl (ACCU-CHEK GUIDE ME) w/Device KIT 1 Device by Does not apply route 2 (two) times daily at 8 am and 10 pm.  . brimonidine (ALPHAGAN) 0.2 % ophthalmic solution Place 1 drop into the left eye 2 times daily.  . celecoxib (CELEBREX) 200 MG capsule Take 200 mg by mouth 2 (two) times daily.  . Cholecalciferol (VITAMIN D-3) 25 MCG (1000 UT) CAPS Take 1 capsule (1,000 Units total) by mouth daily.  Marland Kitchen DEXILANT 60 MG capsule Take 1 capsule by mouth twice daily  . dorzolamide-timolol (COSOPT) 22.3-6.8 MG/ML ophthalmic solution Place 1 drop into both eyes 2 (two) times daily.  . DULoxetine (CYMBALTA) 60 MG capsule TAKE 1 CAPSULE BY MOUTH ONCE DAILY  . enalapril (VASOTEC) 20 MG tablet Take 1 tablet (20 mg total) by mouth daily.  . Exenatide ER (BYDUREON BCISE) 2 MG/0.85ML AUIJ Inject 2 mg into the skin once a week.  . ferrous gluconate (FERGON) 324 MG tablet Take 1 tablet by mouth once daily with breakfast  . glucose blood (ACCU-CHEK GUIDE) test strip Use as instructed, check once to twice daily  . hydrochlorothiazide (HYDRODIURIL) 25 MG tablet Take 1 tablet (25 mg total) by  mouth daily.  Marland Kitchen HYDROcodone-acetaminophen (NORCO) 10-325 MG tablet 1 tablet every 6 (six) hours.  . hydrocortisone (PROCTOSOL HC) 2.5 % rectal cream Place 1 application rectally 2 (two) times daily.  . Insulin Glargine, 1 Unit Dial, (TOUJEO SOLOSTAR) 300 UNIT/ML SOPN Inject 22 Units into the skin daily.  . Insulin Pen Needle (B-D UF III MINI PEN NEEDLES) 31G X 5 MM MISC USE TO INJECT TOUJEO  . latanoprost (XALATAN) 0.005 % ophthalmic solution Place 1 drop into the right eye at bedtime.  . levETIRAcetam (KEPPRA) 250 MG tablet Take 250 mg by mouth daily.  . meclizine (ANTIVERT) 25 MG tablet Take 1 tablet (25 mg total) by mouth 2 (two) times daily.  . metFORMIN (GLUCOPHAGE) 500 MG tablet Take 1 tablet (500 mg total) by mouth 2 (two) times daily with a meal.  . mupirocin ointment (BACTROBAN) 2 % APPLY OINTMENT TOPICALLY TWICE DAILY TO OPEN SKIN LESIONS  . NEEDLE, DISP, 30 G (BD DISP NEEDLES) 30G X 1/2" MISC 1 each by Does not apply route daily.  Marland Kitchen PROCTOZONE-HC 2.5 % rectal cream APPLY RECTALLY TWICE DAILY  . triamcinolone cream (KENALOG) 0.1 % Apply 1 application topically as needed (for skin irritation). Reported on 04/24/2016   No current facility-administered medications on file prior to visit.      Allergies  Allergen Reactions  . Kiwi Extract Hives  . Lyrica [Pregabalin] Other (See Comments)    psychosis  . Sunflowerseed Oil Hives  . Hydrocodone-Acetaminophen Nausea  Only    "Extended release form only"  . Invokana [Canagliflozin] Other (See Comments)    UTI  . Neurontin [Gabapentin] Other (See Comments)    Intolerance/ineffective  . Nitrofuran Derivatives Nausea Only and Other (See Comments)    stomach pain and weight loss  . Tape Other (See Comments)    Pulls skin off, can use paper tape     Objective:  Vascular Examination: Capillary refill time immediate x 10 digits.  Dorsalis pedis present b/l.  Posterior tibial pulses present b/l.  Digital hair absent b/l.  Skin  temperature gradient WNL b/l.   Dermatological Examination: Skin with normal turgor, texture and tone b/l.  Toenails 1-5 b/l discolored, thick, dystrophic with subungual debris and pain with palpation to nailbeds due to thickness of nails.  Hyperkeratotic lesion submet head 5 b/l  with tenderness to palpation. No edema, no erythema, no drainage, no flocculence.  Musculoskeletal: Muscle strength 5/5 to all LE muscle groups b/l.   HAV with bunion b/l. Hammertoes 2, 3 b/l.   No pain, crepitus or joint limitation noted with ROM.   Her diabetic shoes did not fit on today's visit. They appear to be 1/2 size too small. Her inserts also appear to be too thick in arch area.  Neurological Examination: Protective sensation diminished with 10 gram monofilament bilaterally.  Assessment: Painful onychomycosis toenails 1-5 b/l  Calluses submet head 5 b/l NIDDM with neuropathy  Plan: 1. Toenails 1-5 b/l were debrided in length and girth without iatrogenic bleeding.  2. Calluses pared submetatarsal head 5 b/l utilizing sterile scalpel blade without incident. 3. Patient to continue soft, supportive shoe gear daily. 4. We will return her diabetic shoes for a larger size. She will pick them up when they arrive. I have asked her to schedule her pickup on a day our Pedorthist is here.  5. Patient to report any pedal injuries to medical professional immediately. 6. Follow up 3 months.  7. Patient/POA to call should there be a concern in the interim.

## 2019-08-28 ENCOUNTER — Encounter (HOSPITAL_COMMUNITY): Payer: Medicare Other

## 2019-08-31 ENCOUNTER — Ambulatory Visit (HOSPITAL_COMMUNITY): Payer: Medicare Other

## 2019-09-18 ENCOUNTER — Ambulatory Visit (INDEPENDENT_AMBULATORY_CARE_PROVIDER_SITE_OTHER): Payer: Medicare Other | Admitting: Orthotics

## 2019-09-18 ENCOUNTER — Other Ambulatory Visit: Payer: Self-pay

## 2019-09-18 DIAGNOSIS — B351 Tinea unguium: Secondary | ICD-10-CM

## 2019-09-18 DIAGNOSIS — M79674 Pain in right toe(s): Secondary | ICD-10-CM

## 2019-09-18 DIAGNOSIS — E1142 Type 2 diabetes mellitus with diabetic polyneuropathy: Secondary | ICD-10-CM

## 2019-09-18 DIAGNOSIS — M79675 Pain in left toe(s): Secondary | ICD-10-CM

## 2019-09-18 DIAGNOSIS — M2042 Other hammer toe(s) (acquired), left foot: Secondary | ICD-10-CM

## 2019-09-18 DIAGNOSIS — M5416 Radiculopathy, lumbar region: Secondary | ICD-10-CM | POA: Diagnosis not present

## 2019-09-18 DIAGNOSIS — L84 Corns and callosities: Secondary | ICD-10-CM

## 2019-09-18 DIAGNOSIS — M2041 Other hammer toe(s) (acquired), right foot: Secondary | ICD-10-CM

## 2019-09-18 NOTE — Progress Notes (Signed)

## 2019-09-19 ENCOUNTER — Encounter (HOSPITAL_COMMUNITY): Payer: Medicare Other

## 2019-09-26 ENCOUNTER — Ambulatory Visit (HOSPITAL_COMMUNITY)
Admission: RE | Admit: 2019-09-26 | Discharge: 2019-09-26 | Disposition: A | Discharge status: Home / Self Care | Payer: Medicare Other | Source: Ambulatory Visit | Attending: Cardiovascular Disease | Admitting: Cardiovascular Disease

## 2019-09-26 ENCOUNTER — Other Ambulatory Visit (HOSPITAL_COMMUNITY): Payer: Self-pay | Admitting: Cardiology

## 2019-09-26 ENCOUNTER — Other Ambulatory Visit: Payer: Self-pay

## 2019-09-26 DIAGNOSIS — I6523 Occlusion and stenosis of bilateral carotid arteries: Secondary | ICD-10-CM | POA: Diagnosis not present

## 2019-09-27 DIAGNOSIS — L570 Actinic keratosis: Secondary | ICD-10-CM | POA: Diagnosis not present

## 2019-09-28 ENCOUNTER — Other Ambulatory Visit: Payer: Self-pay | Admitting: Neurology

## 2019-09-28 ENCOUNTER — Other Ambulatory Visit: Payer: Self-pay | Admitting: Medical

## 2019-10-02 ENCOUNTER — Other Ambulatory Visit: Payer: Self-pay | Admitting: Medical

## 2019-10-02 DIAGNOSIS — E118 Type 2 diabetes mellitus with unspecified complications: Secondary | ICD-10-CM

## 2019-10-04 ENCOUNTER — Telehealth: Payer: Self-pay | Admitting: Cardiology

## 2019-10-04 NOTE — Telephone Encounter (Signed)
Lm to call back ./cy 

## 2019-10-04 NOTE — Telephone Encounter (Signed)
Patient returning Christine's phone call

## 2019-10-04 NOTE — Telephone Encounter (Signed)
Patient returning Melinda's call in regards to carotid test results.

## 2019-10-04 NOTE — Telephone Encounter (Signed)
Pt called and verbalized understanding of her Carotid results.

## 2019-10-11 ENCOUNTER — Encounter: Payer: Self-pay | Admitting: Medical

## 2019-10-11 ENCOUNTER — Other Ambulatory Visit: Payer: Self-pay

## 2019-10-11 ENCOUNTER — Ambulatory Visit (INDEPENDENT_AMBULATORY_CARE_PROVIDER_SITE_OTHER): Payer: Medicare Other | Admitting: Medical

## 2019-10-11 VITALS — Ht 65.5 in | Wt 200.0 lb

## 2019-10-11 DIAGNOSIS — R059 Cough, unspecified: Secondary | ICD-10-CM | POA: Insufficient documentation

## 2019-10-11 DIAGNOSIS — U071 COVID-19: Secondary | ICD-10-CM

## 2019-10-11 DIAGNOSIS — R05 Cough: Secondary | ICD-10-CM | POA: Diagnosis not present

## 2019-10-11 DIAGNOSIS — R432 Parageusia: Secondary | ICD-10-CM | POA: Diagnosis not present

## 2019-10-11 DIAGNOSIS — I6523 Occlusion and stenosis of bilateral carotid arteries: Secondary | ICD-10-CM

## 2019-10-11 DIAGNOSIS — R195 Other fecal abnormalities: Secondary | ICD-10-CM | POA: Diagnosis not present

## 2019-10-11 NOTE — Progress Notes (Signed)
Subjective:     Patient ID: Robyn Hill, female   DOB: January 26, 1946, 73 y.o.   MRN: [id number]  This visit type was conducted due to national recommendations for restrictions regarding the COVID-19 Pandemic (e.g. social distancing) in an effort to limit this patient's exposure and mitigate transmission in our community.  Due to their co-morbid illnesses, this patient is at least at moderate risk for complications without adequate follow up.  This format is felt to be most appropriate for this patient at this time.    Documentation for virtual audio and video telecommunications through Zoom encounter:  The patient was located at home. The provider was located in the office. The patient did consent to this visit and is aware of possible charges through their insurance for this visit.  The other persons participating in this telemedicine service were none. Time spent on call was 20 minutes and in review of previous records 20 minutes total.  This virtual service is not related to other E/M service within previous 7 days.   HPI Chief Complaint  Patient presents with  . Follow-up    +COVID   Virtual consult today for Covid.  She notes 9 days ago she started having symptoms including cough, lethargy, sore throat, headache, and the next day she lost her sense of taste and smell.  She has been mildly short of breath.  She has had about 4 loose stools a day but it has been orange-colored.  She has had darker yellow urine not clear.  Check she has seen some blood in urine.  She called her urologist about that and is waiting a call back.  She has had some mild dizziness.  She got tested shortly after the change in sense of smell and her test came back positive on 10/05/19.  She is a little better now than she was most of this past week but was really weak and fatigued prior.  Her husband also has Covid.  His symptoms started just a few days after hers.  He seems to be rebounding a little quicker  although he does have a significant cough and productive sputum.  not sure how they acquired Covid as they had not been out of the house much.  No appetite.  Drinking a lot of fluids.  No vomiting.  No other aggravating or relieving factors. No other complaint.  Past Medical History:  Diagnosis Date  . Adrenal mass, right ([place])   . Allergy   . Anemia   . Asthma    "as a teenager"  . Blind right eye    since childhood  . Blood transfusion 1970  . Chronic back pain   . Chronic diarrhea    Colestipol therapy  . Depression   . Diverticulitis   . Diverticulosis   . Fatty liver   . Female bladder prolapse   . GERD (gastroesophageal reflux disease)   . Glaucoma   . H/O bone density study 11/29/2014   normal study although mild decreased in density from prior study  . H/O hiatal hernia   . Hemorrhoids   . History of kidney stones   . History of uterine cancer    s/p hysterectomy  . Hyperlipidemia   . Hypertension   . Migraine    hx of migraines in past   . Neuromuscular disorder ([place])    diabetic neuropahthy  . Numbness and tingling of both legs   . Osteoarthritis   . Rheumatic fever    age 35  .  Type II diabetes mellitus ([place])   . Urinary incontinence   . Vitamin D deficiency    Current Outpatient Medications on File Prior to Visit  Medication Sig Dispense Refill  . Accu-Chek FastClix Lancets MISC Check sugar twice daily. 102 each 6  . atorvastatin (LIPITOR) 40 MG tablet Take 1 tablet (40 mg total) by mouth at bedtime. 90 tablet 3  . baclofen (LIORESAL) 10 MG tablet TAKE 1/2 (ONE-HALF) TABLET BY MOUTH AT BEDTIME 45 tablet 0  . Blood Glucose Calibration (ACCU-CHEK INSTANT CONTROL) LIQD Use for controls for accu-chek activa meter 1 each 0  . Blood Glucose Monitoring Suppl (ACCU-CHEK GUIDE ME) w/Device KIT 1 Device by Does not apply route 2 (two) times daily at 8 am and 10 pm. 1 kit 0  . brimonidine (ALPHAGAN) 0.2 % ophthalmic solution Place 1 drop into the left eye 2 times  daily.    . celecoxib (CELEBREX) 200 MG capsule Take 200 mg by mouth 2 (two) times daily.    . Cholecalciferol (VITAMIN D-3) 25 MCG (1000 UT) CAPS Take 1 capsule (1,000 Units total) by mouth daily. 90 capsule 3  . DEXILANT 60 MG capsule Take 1 capsule by mouth twice daily 180 capsule 0  . dorzolamide-timolol (COSOPT) 22.3-6.8 MG/ML ophthalmic solution Place 1 drop into both eyes 2 (two) times daily.    . DULoxetine (CYMBALTA) 60 MG capsule TAKE 1 CAPSULE BY MOUTH ONCE DAILY 90 capsule 3  . enalapril (VASOTEC) 20 MG tablet Take 1 tablet (20 mg total) by mouth daily. 90 tablet 3  . Exenatide ER (BYDUREON BCISE) 2 MG/0.85ML AUIJ Inject 2 mg into the skin once a week. 15 mL 2  . ferrous gluconate (FERGON) 324 MG tablet Take 1 tablet by mouth once daily with breakfast 90 tablet 0  . glucose blood (ACCU-CHEK GUIDE) test strip Use as instructed, check once to twice daily 100 each 12  . hydrochlorothiazide (HYDRODIURIL) 25 MG tablet Take 1 tablet (25 mg total) by mouth daily. 90 tablet 3  . HYDROcodone-acetaminophen (NORCO) 10-325 MG tablet 1 tablet every 6 (six) hours.    . Insulin Pen Needle (B-D UF III MINI PEN NEEDLES) 31G X 5 MM MISC USE TO INJECT TOUJEO AS DIRECTED 100 each 0  . latanoprost (XALATAN) 0.005 % ophthalmic solution Place 1 drop into the right eye at bedtime.    . levETIRAcetam (KEPPRA) 250 MG tablet Take 250 mg by mouth daily.    . meclizine (ANTIVERT) 25 MG tablet Take 1 tablet (25 mg total) by mouth 2 (two) times daily. 30 tablet 0  . metFORMIN (GLUCOPHAGE) 500 MG tablet Take 1 tablet (500 mg total) by mouth 2 (two) times daily with a meal. 180 tablet 1  . mupirocin ointment (BACTROBAN) 2 % APPLY OINTMENT TOPICALLY TWICE DAILY TO OPEN SKIN LESIONS    . NEEDLE, DISP, 30 G (BD DISP NEEDLES) 30G X 1/2" MISC 1 each by Does not apply route daily. 50 each 11  . PROCTOZONE-HC 2.5 % rectal cream APPLY RECTALLY TWICE DAILY    . TOUJEO SOLOSTAR 300 UNIT/ML SOPN INJECT 22 UNITS SUBCUTANEOUSLY  ONCE DAILY 6 mL 0  . triamcinolone cream (KENALOG) 0.1 % Apply 1 application topically as needed (for skin irritation). Reported on 04/24/2016    . hydrocortisone (PROCTOSOL HC) 2.5 % rectal cream Place 1 application rectally 2 (two) times daily. (Patient not taking: Reported on 10/11/2019) 30 g 2   No current facility-administered medications on file prior to visit.    Review  of Systems As in subjective    Objective:   Physical Exam Due to coronavirus pandemic stay at home measures, patient visit was virtual and they were not examined in person.   Ht 5' 5.5" (1.664 m)   Wt 200 lb (90.7 kg)   BMI 32.78 kg/m       Assessment:     Encounter Diagnoses  Name Primary?  . Cough Yes  . Lab test positive for detection of COVID-19 virus   . Taste absent   . Loose stools   . COVID-19 virus infection        Plan:     We discussed symptoms and concerns. General recommendations: I recommend you rest, hydrate well with water and clear fluids throughout the day.   You can use Tylenol for pain or fever You can use over the counter Delsym for cough. You can use over the counter Emetrol for nausea.     If you are having trouble breathing, if you are very weak, have high fever 103 or higher consistently despite Tylenol, or uncontrollable nausea and vomiting, then call or go to the emergency department.    Covid symptoms such as fatigue and cough can linger over 2 weeks, even after the initial fever, aches, chills, and other initial symptoms.  I advise she stop her hydrochlorothiazide  And enalapril for the next 3 days to help hydrate and reduce risk of complication to the kidney.  She is on a threshold of needing to be seen in person.  I advised she consider going to the emergency department for further evaluation or to urgent care that we will see her in person if possible for labs, urine test, and clinical exam.  Similarly, advise she have her husband do the same since his symptoms are  similar and he has productive cough and they both are high risk in general.  Self Quarantine: The CDC, Centers for Disease Control has recommended a self quarantine of 10 days from the start of your illness until you are symptom-free including at least 24 hours of no symptoms including no fever, no shortness of breath, and no body aches and chills, by day 10 before returning to work or general contact with the public.  What does self quarantine mean: avoiding contact with people as much as possible.   Particularly in your house, isolate your self from others in a separate room, wear a mask when possible in the room, particularly if coughing a lot.   Have others bring food, water, medications, etc., to your door, but avoid direct contact with your household contacts during this time to avoid spreading the infection to them.   If you have a separate bathroom and living quarters during the next 2 weeks away from others, that would be preferable.    If you can't completely isolate, then wear a mask, wash hands frequently with soap and water for at least 15 seconds, minimize close contact with others, and have a friend or family member check regularly from a distance to make sure you are not getting seriously worse.     You should not be going out in public, should not be going to stores, to work or other public places until all your symptoms have resolved and at least 10 days + 24 hours of no symptoms at all have transpired.   Ideally you should avoid contact with others for a full 10 days if possible.  One of the goals is to limit spread to high  risk people; people that are older and elderly, people with multiple health issues like diabetes, heart disease, lung disease, and anybody that has weakened immune systems such as people with cancer or on immunosuppressive therapy.    Amirrah was seen today for follow-up.  Diagnoses and all orders for this visit:  Cough -     Temperature monitoring; Future  Lab  test positive for detection of COVID-19 virus -     Temperature monitoring; Future  Taste absent -     Temperature monitoring; Future  Loose stools -     Temperature monitoring; Future  COVID-19 virus infection -     Temperature monitoring; Future  Other orders -     [place]

## 2019-10-22 ENCOUNTER — Other Ambulatory Visit: Payer: Self-pay | Admitting: Medical

## 2019-10-23 NOTE — Telephone Encounter (Signed)
Received fa from [facility] stating that th ept. Needs a refill on Metformin and enalapril pt. Last seen 10/11/19.

## 2019-11-08 DIAGNOSIS — R310 Gross hematuria: Secondary | ICD-10-CM | POA: Diagnosis not present

## 2019-11-08 DIAGNOSIS — R350 Frequency of micturition: Secondary | ICD-10-CM | POA: Diagnosis not present

## 2019-11-08 DIAGNOSIS — N3020 Other chronic cystitis without hematuria: Secondary | ICD-10-CM | POA: Diagnosis not present

## 2019-11-16 ENCOUNTER — Ambulatory Visit (INDEPENDENT_AMBULATORY_CARE_PROVIDER_SITE_OTHER): Payer: Medicare Other | Admitting: Medical

## 2019-11-16 ENCOUNTER — Encounter: Payer: Self-pay | Admitting: Medical

## 2019-11-16 ENCOUNTER — Other Ambulatory Visit: Payer: Self-pay

## 2019-11-16 VITALS — BP 120/66 | HR 73 | Temp 98.2°F | Ht 65.0 in | Wt 204.8 lb

## 2019-11-16 DIAGNOSIS — R5382 Chronic fatigue, unspecified: Secondary | ICD-10-CM | POA: Diagnosis not present

## 2019-11-16 DIAGNOSIS — U071 COVID-19: Secondary | ICD-10-CM | POA: Diagnosis not present

## 2019-11-16 DIAGNOSIS — M4316 Spondylolisthesis, lumbar region: Secondary | ICD-10-CM

## 2019-11-16 DIAGNOSIS — D638 Anemia in other chronic diseases classified elsewhere: Secondary | ICD-10-CM | POA: Diagnosis not present

## 2019-11-16 DIAGNOSIS — G8929 Other chronic pain: Secondary | ICD-10-CM

## 2019-11-16 DIAGNOSIS — R32 Unspecified urinary incontinence: Secondary | ICD-10-CM

## 2019-11-16 DIAGNOSIS — E559 Vitamin D deficiency, unspecified: Secondary | ICD-10-CM | POA: Diagnosis not present

## 2019-11-16 DIAGNOSIS — Z7189 Other specified counseling: Secondary | ICD-10-CM

## 2019-11-16 DIAGNOSIS — Z7185 Encounter for immunization safety counseling: Secondary | ICD-10-CM

## 2019-11-16 DIAGNOSIS — Z9889 Other specified postprocedural states: Secondary | ICD-10-CM | POA: Diagnosis not present

## 2019-11-16 DIAGNOSIS — E2839 Other primary ovarian failure: Secondary | ICD-10-CM | POA: Insufficient documentation

## 2019-11-16 DIAGNOSIS — D500 Iron deficiency anemia secondary to blood loss (chronic): Secondary | ICD-10-CM

## 2019-11-16 DIAGNOSIS — I10 Essential (primary) hypertension: Secondary | ICD-10-CM

## 2019-11-16 DIAGNOSIS — G4761 Periodic limb movement disorder: Secondary | ICD-10-CM | POA: Diagnosis not present

## 2019-11-16 DIAGNOSIS — K219 Gastro-esophageal reflux disease without esophagitis: Secondary | ICD-10-CM

## 2019-11-16 DIAGNOSIS — K5903 Drug induced constipation: Secondary | ICD-10-CM

## 2019-11-16 DIAGNOSIS — M961 Postlaminectomy syndrome, not elsewhere classified: Secondary | ICD-10-CM | POA: Diagnosis not present

## 2019-11-16 DIAGNOSIS — M5442 Lumbago with sciatica, left side: Secondary | ICD-10-CM

## 2019-11-16 DIAGNOSIS — E118 Type 2 diabetes mellitus with unspecified complications: Secondary | ICD-10-CM | POA: Diagnosis not present

## 2019-11-16 DIAGNOSIS — Z8542 Personal history of malignant neoplasm of other parts of uterus: Secondary | ICD-10-CM

## 2019-11-16 DIAGNOSIS — R0982 Postnasal drip: Secondary | ICD-10-CM | POA: Diagnosis not present

## 2019-11-16 DIAGNOSIS — Z0000 Encounter for general adult medical examination without abnormal findings: Secondary | ICD-10-CM

## 2019-11-16 DIAGNOSIS — R432 Parageusia: Secondary | ICD-10-CM

## 2019-11-16 DIAGNOSIS — Z8639 Personal history of other endocrine, nutritional and metabolic disease: Secondary | ICD-10-CM

## 2019-11-16 DIAGNOSIS — Z780 Asymptomatic menopausal state: Secondary | ICD-10-CM

## 2019-11-16 DIAGNOSIS — E1142 Type 2 diabetes mellitus with diabetic polyneuropathy: Secondary | ICD-10-CM

## 2019-11-16 DIAGNOSIS — E785 Hyperlipidemia, unspecified: Secondary | ICD-10-CM

## 2019-11-16 DIAGNOSIS — H409 Unspecified glaucoma: Secondary | ICD-10-CM

## 2019-11-16 DIAGNOSIS — Z1231 Encounter for screening mammogram for malignant neoplasm of breast: Secondary | ICD-10-CM

## 2019-11-16 MED ORDER — CETIRIZINE HCL 10 MG PO TABS: 10.0000 mg | ORAL_TABLET | Freq: Every day | ORAL | 1 refills | Status: DC

## 2019-11-16 NOTE — Patient Instructions (Signed)
Schedule mammogram and bone density test up in [place], [place] at your convenience  Begin Cetirizine allergy pill at bedtime to help with post nasal drainage and cough  We will call with lab results.  Gradually resume activity as your body adjusts to recent illness.     Other evaluation/screenings to consider this year is ABI blood flow studies in your legs for screen for peripheral vascular disease .   Repeat carotid ultrasound 09/2020  Follow up with urology and CT scan.

## 2019-11-16 NOTE — Progress Notes (Signed)
Subjective:    Robyn Hill is a 74 y.o. female who presents for Preventative Services visit and chronic medical problems/med check visit.    Primary Care Provider Doralene Glanz, Camelia Eng, PA-C here for primary care  Current Health Care Team:  Dentist, Dr. Joya Gaskins  Eye doctor, Dr. Delman Cheadle  Dr. Clydell Hakim, neurosurgery/pain management  Dr. Minus Breeding, cardiology  Dr. Margette Fast, neurology  Dr. Vertell Limber, neurosurgery  Medical Services you may have received from other than [facility] providers in the past year (date may be approximate) none  Exercise Current exercise habits: The patient does not participate in regular exercise at present.   Nutrition/Diet Current diet: well balanced  Depression Screen Depression screen [facility] 2/9 11/16/2019  Decreased Interest 0  Down, Depressed, Hopeless 0  PHQ - 2 Score 0  Some recent data might be hidden    Activities of Daily Living Screen/Functional Status Survey Is the patient deaf or have difficulty hearing?: Yes(some hearing loss) Does the patient have difficulty seeing, even when wearing glasses/contacts?: Yes(blind in right eye/partial in left) Does the patient have difficulty concentrating, remembering, or making decisions?: Yes(problem remembering) Does the patient have difficulty walking or climbing stairs?: Yes(climbing stairs/arthritis) Does the patient have difficulty dressing or bathing?: No Does the patient have difficulty doing errands alone such as visiting a doctor's office or shopping?: Yes(unable to drive)  Can patient draw a clock face showing 3:15 oclock, yes  Fall Risk Screen Fall Risk  11/16/2019 03/31/2018 09/23/2017 02/19/2016 06/27/2014  Falls in the past year? 0 No Yes No No  Comment - - Emmi Telephone Survey: data to providers prior to load - -  Number falls in past yr: - - 2 or more - -  Comment - - Emmi Telephone Survey Actual Response = 2 - -  Injury with Fall? - - No - -    Gait Assessment: Normal gait  observed yes  Advanced directives Does patient have a [place]? Yes Does patient have a Living Will? Yes  Past Medical History:  Diagnosis Date  . Adrenal mass, right ([place])   . Allergy   . Anemia   . Asthma    "as a teenager"  . Blind right eye    since childhood  . Blood transfusion 1970  . Chronic back pain   . Chronic diarrhea    Colestipol therapy  . Depression   . Diverticulitis   . Diverticulosis   . Fatty liver   . Female bladder prolapse   . GERD (gastroesophageal reflux disease)   . Glaucoma   . H/O bone density study 11/29/2014   normal study although mild decreased in density from prior study  . H/O hiatal hernia   . Hemorrhoids   . History of kidney stones   . History of uterine cancer    s/p hysterectomy  . Hyperlipidemia   . Hypertension   . Migraine    hx of migraines in past   . Neuromuscular disorder ([place])    diabetic neuropahthy  . Numbness and tingling of both legs   . Osteoarthritis   . Rheumatic fever    age 52  . Type II diabetes mellitus ([place])   . Urinary incontinence   . Vitamin D deficiency     Past Surgical History:  Procedure Laterality Date  . ABDOMINAL HYSTERECTOMY  1980   no further pap smears needed  . ANTERIOR AND POSTERIOR REPAIR N/A 01/29/2015   Procedure: CYSTOCELE AND RECTOCELE ;  Surgeon:  Bjorn Loser, MD;  Location: [facility];  Service: Urology;  Laterality: N/A;  . BACK SURGERY    . BREAST BIOPSY  08/1997   right  . CARDIAC CATHETERIZATION  11/2006  . CHOLECYSTECTOMY    . COLONOSCOPY  2014   polyps, external hemorrhoids  . CYSTOSCOPY N/A 01/29/2015   Procedure: CYSTOSCOPY;  Surgeon: Bjorn Loser, MD;  Location: [facility];  Service: Urology;  Laterality: N/A;  . DILATION AND CURETTAGE OF UTERUS  1980  . EDSI for back pain  last 01/2009   multiple x 5  . EYE SURGERY     Bil Blind R eye  . [place]  . LIVER BIOPSY  04/1999  . minor laceration repair     right eye  . POSTERIOR  FUSION LUMBAR SPINE  04/05/12  . PULSE GENERATOR IMPLANT Right 03/12/2016   Procedure: Right Reposition of implantable pulse generator;  Surgeon: Erline Levine, MD;  Location: [place] [facility];  Service: Neurosurgery;  Laterality: Right;  Right Reposition of implantable pulse generator  . SPINAL CORD STIMULATOR INSERTION N/A 09/06/2015   Procedure: LUMBAR SPINAL CORD STIMULATOR INSERTION;  Surgeon: Clydell Hakim, MD;  Location: [place] [facility];  Service: Neurosurgery;  Laterality: N/A;  Spinal Cord Stimulator placement  . SPINAL CORD STIMULATOR INSERTION N/A 10/22/2015   Procedure: Laminectomy for spinal cord stimulator paddle lead and implantable generator placement;  Surgeon: Erline Levine, MD;  Location: [place] [facility];  Service: Neurosurgery;  Laterality: N/A;  Laminectomy for spinal cord stimulator paddle lead and implantable generator placement  . steroid injections of hip  last 02/2009   multiple x 4  . TONSILLECTOMY AND ADENOIDECTOMY  1952  . TRABECULECTOMY  1996   bilaterally; "for glaucoma"  . UPPER GI ENDOSCOPY  05/21/14   food, retained in the body of the stomach  . VAGINAL PROLAPSE REPAIR N/A 01/29/2015   Procedure:  VAULT PROLAPSE WITH GRAFT ;  Surgeon: Bjorn Loser, MD;  Location: [facility];  Service: Urology;  Laterality: N/A;    Social History   Socioeconomic History  . Marital status: Married    Spouse name: Lake Bells   . Number of children: 3  . Years of education: Not on file  . Highest education level: Some college, no degree  Occupational History  . Occupation: Retired   Tobacco Use  . Smoking status: Never Smoker  . Smokeless tobacco: Never Used  Substance and Sexual Activity  . Alcohol use: Yes    Comment: rare  . Drug use: No  . Sexual activity: Never    Comment: married  Other Topics Concern  . Not on file  Social History Narrative   Three children and two step.     Right handed   Caffeine rarely    Lives at home with husband    Social Determinants of Health    Financial Resource Strain:   . Difficulty of Paying Living Expenses: Not on file  Food Insecurity:   . Worried About Charity fundraiser in the Last Year: Not on file  . Ran Out of Food in the Last Year: Not on file  Transportation Needs:   . Lack of Transportation (Medical): Not on file  . Lack of Transportation (Non-Medical): Not on file  Physical Activity:   . Days of Exercise per Week: Not on file  . Minutes of Exercise per Session: Not on file  Stress:   . Feeling of Stress : Not on file  Social Connections:   .  Frequency of Communication with Friends and Family: Not on file  . Frequency of Social Gatherings with Friends and Family: Not on file  . Attends Religious Services: Not on file  . Active Member of Clubs or Organizations: Not on file  . Attends Archivist Meetings: Not on file  . Marital Status: Not on file  Intimate Partner Violence:   . Fear of Current or Ex-Partner: Not on file  . Emotionally Abused: Not on file  . Physically Abused: Not on file  . Sexually Abused: Not on file    Family History  Problem Relation Age of Onset  . Heart attack Brother 59  . Hypertension Other   . Diabetes Other   . Cancer Mother 66  . Aplastic anemia Mother 40  . Heart attack Brother 2  . Colon cancer Brother   . Heart attack Sister   . Heart attack Brother 34  . Anesthesia problems Neg Hx      Current Outpatient Medications:  .  Accu-Chek FastClix Lancets MISC, Check sugar twice daily., Disp: 102 each, Rfl: 6 .  atorvastatin (LIPITOR) 40 MG tablet, Take 1 tablet (40 mg total) by mouth at bedtime., Disp: 90 tablet, Rfl: 3 .  baclofen (LIORESAL) 10 MG tablet, TAKE 1/2 (ONE-HALF) TABLET BY MOUTH AT BEDTIME, Disp: 45 tablet, Rfl: 0 .  Blood Glucose Calibration (ACCU-CHEK INSTANT CONTROL) LIQD, Use for controls for accu-chek activa meter, Disp: 1 each, Rfl: 0 .  Blood Glucose Monitoring Suppl (ACCU-CHEK GUIDE ME) w/Device KIT, 1 Device by Does not apply route 2  (two) times daily at 8 am and 10 pm., Disp: 1 kit, Rfl: 0 .  brimonidine (ALPHAGAN) 0.2 % ophthalmic solution, Place 1 drop into the left eye 2 times daily., Disp: , Rfl:  .  celecoxib (CELEBREX) 200 MG capsule, Take 200 mg by mouth 2 (two) times daily., Disp: , Rfl:  .  Cholecalciferol (VITAMIN D-3) 25 MCG (1000 UT) CAPS, Take 1 capsule (1,000 Units total) by mouth daily., Disp: 90 capsule, Rfl: 3 .  DEXILANT 60 MG capsule, Take 1 capsule by mouth twice daily, Disp: 180 capsule, Rfl: 0 .  dorzolamide-timolol (COSOPT) 22.3-6.8 MG/ML ophthalmic solution, Place 1 drop into both eyes 2 (two) times daily., Disp: , Rfl:  .  DULoxetine (CYMBALTA) 60 MG capsule, TAKE 1 CAPSULE BY MOUTH ONCE DAILY, Disp: 90 capsule, Rfl: 3 .  enalapril (VASOTEC) 20 MG tablet, Take 1 tablet by mouth once daily, Disp: 90 tablet, Rfl: 0 .  Exenatide ER (BYDUREON BCISE) 2 MG/0.85ML AUIJ, Inject 2 mg into the skin once a week., Disp: 15 mL, Rfl: 2 .  ferrous gluconate (FERGON) 324 MG tablet, Take 1 tablet by mouth once daily with breakfast, Disp: 90 tablet, Rfl: 0 .  glucose blood (ACCU-CHEK GUIDE) test strip, Use as instructed, check once to twice daily, Disp: 100 each, Rfl: 12 .  hydrochlorothiazide (HYDRODIURIL) 25 MG tablet, Take 1 tablet (25 mg total) by mouth daily., Disp: 90 tablet, Rfl: 3 .  HYDROcodone-acetaminophen (NORCO) 10-325 MG tablet, 1 tablet every 6 (six) hours., Disp: , Rfl:  .  Insulin Pen Needle (B-D UF III MINI PEN NEEDLES) 31G X 5 MM MISC, USE TO INJECT TOUJEO AS DIRECTED, Disp: 100 each, Rfl: 0 .  latanoprost (XALATAN) 0.005 % ophthalmic solution, Place 1 drop into the right eye at bedtime., Disp: , Rfl:  .  levETIRAcetam (KEPPRA) 250 MG tablet, Take 250 mg by mouth daily., Disp: , Rfl:  .  metFORMIN (  GLUCOPHAGE) 500 MG tablet, Take 1 tablet (500 mg total) by mouth 2 (two) times daily with a meal., Disp: 180 tablet, Rfl: 1 .  mupirocin ointment (BACTROBAN) 2 %, APPLY OINTMENT TOPICALLY TWICE DAILY TO OPEN  SKIN LESIONS, Disp: , Rfl:  .  NEEDLE, DISP, 30 G (BD DISP NEEDLES) 30G X 1/2" MISC, 1 each by Does not apply route daily., Disp: 50 each, Rfl: 11 .  TOUJEO SOLOSTAR 300 UNIT/ML SOPN, INJECT 22 UNITS SUBCUTANEOUSLY ONCE DAILY, Disp: 6 mL, Rfl: 0 .  triamcinolone cream (KENALOG) 0.1 %, Apply 1 application topically as needed (for skin irritation). Reported on 04/24/2016, Disp: , Rfl:  .  cetirizine (ZYRTEC) 10 MG tablet, Take 1 tablet (10 mg total) by mouth at bedtime., Disp: 30 tablet, Rfl: 1 .  hydrocortisone (PROCTOSOL HC) 2.5 % rectal cream, Place 1 application rectally 2 (two) times daily. (Patient not taking: Reported on 11/16/2019), Disp: 30 g, Rfl: 2 .  meclizine (ANTIVERT) 25 MG tablet, Take 1 tablet (25 mg total) by mouth 2 (two) times daily. (Patient not taking: Reported on 11/16/2019), Disp: 30 tablet, Rfl: 0 .  nitrofurantoin (MACRODANTIN) 100 MG capsule, Take 100 mg by mouth 2 (two) times daily., Disp: , Rfl:  .  PROCTOZONE-HC 2.5 % rectal cream, APPLY RECTALLY TWICE DAILY, Disp: , Rfl:   Allergies  Allergen Reactions  . Kiwi Extract Hives  . Lyrica [Pregabalin] Other (See Comments)    psychosis  . Sunflowerseed Oil Hives  . Hydrocodone-Acetaminophen Nausea Only    "Extended release form only"  . Invokana [Canagliflozin] Other (See Comments)    UTI  . Neurontin [Gabapentin] Other (See Comments)    Intolerance/ineffective  . Nitrofuran Derivatives Nausea Only and Other (See Comments)    stomach pain and weight loss  . Tape Other (See Comments)    Pulls skin off, can use paper tape    History reviewed: allergies, current medications, past family history, past medical history, past social history, past surgical history and problem list  issues discussed:  Saw urologist last week.  They are planning CT abdomen given recent gross hematuria.  Bladder is falling or prolapsing again.  She and husband are still recuperating from [place].  Smell and taste hasn't come back.   Still  feels  fatigued/tired.  Her and husband both thought they were going to die, very ill  She gets some post nasal drip, waking her up all through the night. Interfering with sleep.  Has sensation of burning under skin right lower abdomen.    Compliant with medications    Objective:      Biometrics BP 120/66   Pulse 73   Temp 98.2 F (36.8 C)   Ht '5\' 5"'  (1.651 m)   Wt 204 lb 12.8 oz (92.9 kg)   SpO2 96%   BMI 34.08 kg/m    BP Readings from Last 3 Encounters:  11/16/19 120/66  07/17/19 136/78  05/17/19 (!) 156/81    Wt Readings from Last 3 Encounters:  11/16/19 204 lb 12.8 oz (92.9 kg)  10/11/19 200 lb (90.7 kg)  07/17/19 205 lb (93 kg)     Cognitive Testing  Alert? Yes  Normal Appearance?Yes  Oriented to person? Yes  Place? Yes   Time? Yes  Recall of three objects?  Yes  Can perform simple calculations? Yes  Displays appropriate judgment?Yes  Can read the correct time from a watch face?Yes  General appearance: alert, no distress, WD/WN, white female  Other pertinent exam: Neck: supple,  no lymphadenopathy, no thyromegaly, no masses, no bruits Heart: RRR, normal S1, S2, no murmurs Lungs: CTA bilaterally, no wheezes, rhonchi, or rales Abdomen: +bs, soft, non tender, non distended Musculoskeletal: bony arthritic changes of fingers, knees, hands, otherwise nontender, no swelling, no obvious deformity Extremities: 1+ nonpitting LE edema, no cyanosis, no clubbing Pulses: 2+ symmetric, upper and 1+ bilat lower extremities, normal cap refill Neurological: +decreased sensation in feet, otherwise alert, oriented x 3, CN2-12 intact, strength normal upper extremities and lower extremities, sensation normal throughout, DTRs 2+ throughout, no cerebellar signs, gait normal Psychiatric: normal affect, behavior normal, pleasant  Breast/pelvic deferred   Assessment:   Encounter Diagnoses  Name Primary?  . Medicare annual wellness visit, subsequent Yes  . Essential  hypertension, benign   . Taste absent   . Urinary incontinence, unspecified type   . Vaccine counseling   . Post-nasal drainage   . History of back surgery   . Vitamin D deficiency   . Anemia of chronic disease   . Chronic fatigue   . Post laminectomy syndrome   . Periodic limb movement disorder   . Other chronic pain   . History of thyroid disease   . History of uterine cancer   . Iron deficiency anemia secondary to blood loss (chronic)   . Glaucoma, unspecified glaucoma type, unspecified laterality   . Hyperlipidemia, unspecified hyperlipidemia type   . Chronic bilateral low back pain with left-sided sciatica   . Gastroesophageal reflux disease without esophagitis   . Constipation due to opioid therapy   . Diabetes mellitus with complication ([place])   . Diabetic polyneuropathy associated with type 2 diabetes mellitus ([place])   . Spondylolisthesis of lumbar region   . Post-menopausal   . Estrogen deficiency   . Encounter for screening mammogram for malignant neoplasm of breast   . COVID-19 with multiple comorbidities      Plan:   A preventative services visit was completed today.  During the course of the visit today, we discussed and counseled about appropriate screening and preventive services.  A health risk assessment was established today that included a review of current medications, allergies, social history, family history, medical and preventative health history, biometrics, and preventative screenings to identify potential safety concerns or impairments.  A personalized plan was printed today for your records and use.   Personalized health advice and education was given today to reduce health risks and promote self management and wellness.  Information regarding end of life planning was discussed today.  Reviewed several recent lab results in chart record   Conditions/risks identified: Fall risk, hearing, chronic pain and arthralgia Recuperating from recent covid  infection Visual deficits  problems discussed today: C/t current medications  Begin zyrtec for post nasal drainage, worse at night    Recommendations:  I recommend a yearly ophthalmology/optometry visit for glaucoma screening and eye checkup  I recommended a yearly dental visit for hygiene and checkup  Advanced directives - discussed nature and purpose of Advanced Directives, encouraged them to complete them if they have not done so and/or encouraged them to get [place] a copy if they have done this already.  Referrals today: Bone density and mammogram  Immunizations: Up to date on influenza and pneumococcal vaccines  counseled on Shingrix, Td, and covid vaccines   Cancer screening:  Advised mammogram now  Plan colonoscopy or Cologuard possibly within next 1-2 years  Advised self skin surveillance   Samarie was seen today for medicare wellness.  Diagnoses and all orders  for this visit:  Medicare annual wellness visit, subsequent  Essential hypertension, benign -     Comprehensive metabolic panel  Taste absent  Urinary incontinence, unspecified type  Vaccine counseling  Post-nasal drainage  History of back surgery  Vitamin D deficiency  Anemia of chronic disease  Chronic fatigue  Post laminectomy syndrome  Periodic limb movement disorder  Other chronic pain  History of thyroid disease  History of uterine cancer  Iron deficiency anemia secondary to blood loss (chronic)  Glaucoma, unspecified glaucoma type, unspecified laterality  Hyperlipidemia, unspecified hyperlipidemia type  Chronic bilateral low back pain with left-sided sciatica  Gastroesophageal reflux disease without esophagitis  Constipation due to opioid therapy  Diabetes mellitus with complication (HCC) -     Hemoglobin A1c -     Comprehensive metabolic panel  Diabetic polyneuropathy associated with type 2 diabetes mellitus ([place])  Spondylolisthesis of lumbar  region  Post-menopausal -     DG Bone Density; Future  Estrogen deficiency -     DG Bone Density; Future  Encounter for screening mammogram for malignant neoplasm of breast -     MM DIGITAL SCREENING BILATERAL; Future  COVID-19 with multiple comorbidities -     SAR CoV2 Serology (COVID 19)AB(IGG)IA  Other orders -     cetirizine (ZYRTEC) 10 MG tablet; Take 1 tablet (10 mg total) by mouth at bedtime.     Medicare Attestation A preventative services visit was completed today.  During the course of the visit the patient was educated and counseled about appropriate screening and preventive services.  A health risk assessment was established with the patient that included a review of current medications, allergies, social history, family history, medical and preventative health history, biometrics, and preventative screenings to identify potential safety concerns or impairments.  A personalized plan was printed today for the patient's records and use.   Personalized health advice and education was given today to reduce health risks and promote self management and wellness.  Information regarding end of life planning was discussed today.  Dorothea Ogle, PA-C   11/16/2019

## 2019-11-17 LAB — COMPREHENSIVE METABOLIC PANEL
ALT: 32 IU/L (ref 0–32)
AST: 27 IU/L (ref 0–40)
Albumin/Globulin Ratio: 2 (ref 1.2–2.2)
Albumin: 4.7 g/dL (ref 3.7–4.7)
Alkaline Phosphatase: 107 IU/L (ref 39–117)
BUN/Creatinine Ratio: 36 — ABNORMAL HIGH (ref 12–28)
BUN: 27 mg/dL (ref 8–27)
Bilirubin Total: 0.3 mg/dL (ref 0.0–1.2)
CO2: 23 mmol/L (ref 20–29)
Calcium: 10.2 mg/dL (ref 8.7–10.3)
Chloride: 101 mmol/L (ref 96–106)
Creatinine, Ser: 0.75 mg/dL (ref 0.57–1.00)
GFR calc Af Amer: 91 mL/min/{1.73_m2} (ref >59)
GFR calc non Af Amer: 79 mL/min/{1.73_m2} (ref >59)
Globulin, Total: 2.3 g/dL (ref 1.5–4.5)
Glucose: 128 mg/dL — ABNORMAL HIGH (ref 65–99)
Potassium: 5.3 mmol/L — ABNORMAL HIGH (ref 3.5–5.2)
Sodium: 140 mmol/L (ref 134–144)
Total Protein: 7 g/dL (ref 6.0–8.5)

## 2019-11-17 LAB — HEMOGLOBIN A1C
Est. average glucose Bld gHb Est-mCnc: 151 mg/dL
Hgb A1c MFr Bld: 6.9 % — ABNORMAL HIGH (ref 4.8–5.6)

## 2019-11-17 LAB — SAR COV2 SEROLOGY (COVID19)AB(IGG),IA: DiaSorin SARS-CoV-2 Ab, IgG: POSITIVE — AB

## 2019-11-24 ENCOUNTER — Ambulatory Visit (INDEPENDENT_AMBULATORY_CARE_PROVIDER_SITE_OTHER): Payer: Medicare Other | Admitting: Podiatry

## 2019-11-24 ENCOUNTER — Other Ambulatory Visit: Payer: Self-pay

## 2019-11-24 ENCOUNTER — Encounter: Payer: Self-pay | Admitting: Podiatry

## 2019-11-24 DIAGNOSIS — M79674 Pain in right toe(s): Secondary | ICD-10-CM | POA: Diagnosis not present

## 2019-11-24 DIAGNOSIS — B351 Tinea unguium: Secondary | ICD-10-CM | POA: Diagnosis not present

## 2019-11-24 DIAGNOSIS — M79675 Pain in left toe(s): Secondary | ICD-10-CM

## 2019-11-24 DIAGNOSIS — D3501 Benign neoplasm of right adrenal gland: Secondary | ICD-10-CM | POA: Diagnosis not present

## 2019-11-24 DIAGNOSIS — E1142 Type 2 diabetes mellitus with diabetic polyneuropathy: Secondary | ICD-10-CM | POA: Diagnosis not present

## 2019-11-24 DIAGNOSIS — L600 Ingrowing nail: Secondary | ICD-10-CM

## 2019-11-24 DIAGNOSIS — R310 Gross hematuria: Secondary | ICD-10-CM | POA: Diagnosis not present

## 2019-11-24 NOTE — Progress Notes (Signed)
Subjective: Robyn Hill presents today for follow up of preventative diabetic foot care and callus(es) b/l feet and painful mycotic toenails b/l that are difficult to trim. Pain interferes with ambulation. Aggravating factors include wearing enclosed shoe gear. Pain is relieved with periodic professional debridement.   She states she did cut her toenails before today's visit and was unable to reach both great toes which are sore.   She also relates she and her husband had COVID-34 around Thanksgiving, describing it as the sickest she's ever been in her life. Her symptoms were diarrhea and extreme fatigue. They have both recovered.  She has received her diabetic shoes and is pleased with them.  Allergies  Allergen Reactions  . Kiwi Extract Hives  . Lyrica [Pregabalin] Other (See Comments)    psychosis  . Sunflowerseed Oil Hives  . Hydrocodone-Acetaminophen Nausea Only    "Extended release form only"  . Invokana [Canagliflozin] Other (See Comments)    UTI  . Neurontin [Gabapentin] Other (See Comments)    Intolerance/ineffective  . Nitrofuran Derivatives Nausea Only and Other (See Comments)    stomach pain and weight loss  . Tape Other (See Comments)    Pulls skin off, can use paper tape     Objective: There were no vitals filed for this visit.  Vascular Examination:  Capillary refill time to digits immediate b/l, palpable DP pulses b/l, palpable PT pulses b/l, pedal hair absent b/l and skin temperature gradient within normal limits b/l  Dermatological Examination: Pedal skin with normal turgor, texture and tone bilaterally, no open wounds bilaterally, no interdigital macerations bilaterally and toenails bilateral great toes elongated, dystrophic, thickened, crumbly with subungual debris. Remaining toenails recently debrided with no signs of infection noted.  Calluses submet head 5 b/l have resolved.  Musculoskeletal: Normal muscle strength 5/5 to all lower extremity muscle  groups bilaterally, no pain crepitus or joint limitation noted with ROM b/l, bunion deformity noted b/l, patient ambulates independent of any assistive aids and wearing diabetic shoes today.  Neurological: Protective sensation absent with 10g monofilament b/l  Assessment: 1. Pain due to onychomycosis of toenails of both feet   2. Ingrown toenail without infection   3. Diabetic polyneuropathy associated with type 2 diabetes mellitus ([place])     Plan: -Continue diabetic foot care principles. Literature dispensed on today.  -Toenails b/l great toes  were debrided in length and girth.  Offending nail borders debrided and curretaged b/l great toes. Borders cleansed with alcohol. Antibiotic ointment applied. She was instructed to apply triple antibiotic ointment to both great toes once daily for one week. Call if she has any problems. -Plantar calluses have resolved, most likely due to new diabetic shoe gear.  -Patient to continue soft, supportive shoe gear daily. -Patient to report any pedal injuries to medical professional immediately. -Patient/POA to call should there be question/concern in the interim.  Return in about 3 months (around 02/21/2020) for diabetic nail and callus trim.

## 2019-11-24 NOTE — Patient Instructions (Addendum)
Diabetes Mellitus and Foot Care Foot care is an important part of your health, especially when you have diabetes. Diabetes may cause you to have problems because of poor blood flow (circulation) to your feet and legs, which can cause your skin to:  Become thinner and drier.  Break more easily.  Heal more slowly.  Peel and crack. You may also have nerve damage (neuropathy) in your legs and feet, causing decreased feeling in them. This means that you may not notice minor injuries to your feet that could lead to more serious problems. Noticing and addressing any potential problems early is the best way to prevent future foot problems. How to care for your feet Foot hygiene  Wash your feet daily with warm water and mild soap. Do not use hot water. Then, pat your feet and the areas between your toes until they are completely dry. Do not soak your feet as this can dry your skin.  Trim your toenails straight across. Do not dig under them or around the cuticle. File the edges of your nails with an emery board or nail file.  Apply a moisturizing lotion or petroleum jelly to the skin on your feet and to dry, brittle toenails. Use lotion that does not contain alcohol and is unscented. Do not apply lotion between your toes. Shoes and socks  Wear clean socks or stockings every day. Make sure they are not too tight. Do not wear knee-high stockings since they may decrease blood flow to your legs.  Wear shoes that fit properly and have enough cushioning. Always look in your shoes before you put them on to be sure there are no objects inside.  To break in new shoes, wear them for just a few hours a day. This prevents injuries on your feet. Wounds, scrapes, corns, and calluses  Check your feet daily for blisters, cuts, bruises, sores, and redness. If you cannot see the bottom of your feet, use a mirror or ask someone for help.  Do not cut corns or calluses or try to remove them with medicine.  If you  find a minor scrape, cut, or break in the skin on your feet, keep it and the skin around it clean and dry. You may clean these areas with mild soap and water. Do not clean the area with peroxide, alcohol, or iodine.  If you have a wound, scrape, corn, or callus on your foot, look at it several times a day to make sure it is healing and not infected. Check for: ? Redness, swelling, or pain. ? Fluid or blood. ? Warmth. ? Pus or a bad smell. General instructions  Do not cross your legs. This may decrease blood flow to your feet.  Do not use heating pads or hot water bottles on your feet. They may burn your skin. If you have lost feeling in your feet or legs, you may not know this is happening until it is too late.  Protect your feet from hot and cold by wearing shoes, such as at the beach or on hot pavement.  Schedule a complete foot exam at least once a year (annually) or more often if you have foot problems. If you have foot problems, report any cuts, sores, or bruises to your health care provider immediately. Contact a health care provider if:  You have a medical condition that increases your risk of infection and you have any cuts, sores, or bruises on your feet.  You have an injury that is not   healing.  You have redness on your legs or feet.  You feel burning or tingling in your legs or feet.  You have pain or cramps in your legs and feet.  Your legs or feet are numb.  Your feet always feel cold.  You have pain around a toenail. Get help right away if:  You have a wound, scrape, corn, or callus on your foot and: ? You have pain, swelling, or redness that gets worse. ? You have fluid or blood coming from the wound, scrape, corn, or callus. ? Your wound, scrape, corn, or callus feels warm to the touch. ? You have pus or a bad smell coming from the wound, scrape, corn, or callus. ? You have a fever. ? You have a red line going up your leg. Summary  Check your feet every day  for cuts, sores, red spots, swelling, and blisters.  Moisturize feet and legs daily.  Wear shoes that fit properly and have enough cushioning.  If you have foot problems, report any cuts, sores, or bruises to your health care provider immediately.  Schedule a complete foot exam at least once a year (annually) or more often if you have foot problems. This information is not intended to replace advice given to you by your health care provider. Make sure you discuss any questions you have with your health care provider. Document Revised: 06/28/2019 Document Reviewed: 11/06/2016 Elsevier Patient Education  2020 [place]

## 2019-12-05 ENCOUNTER — Other Ambulatory Visit: Payer: Self-pay | Admitting: Medical

## 2019-12-12 DIAGNOSIS — N8111 Cystocele, midline: Secondary | ICD-10-CM | POA: Diagnosis not present

## 2019-12-12 DIAGNOSIS — R310 Gross hematuria: Secondary | ICD-10-CM | POA: Diagnosis not present

## 2019-12-22 ENCOUNTER — Other Ambulatory Visit: Payer: Self-pay | Admitting: Medical

## 2019-12-22 DIAGNOSIS — E118 Type 2 diabetes mellitus with unspecified complications: Secondary | ICD-10-CM

## 2019-12-28 ENCOUNTER — Telehealth: Payer: Self-pay | Admitting: Medical

## 2019-12-28 NOTE — Telephone Encounter (Signed)
Message has been sent to patient via mychart  

## 2019-12-28 NOTE — Telephone Encounter (Signed)
Pt. Called back stating she missed a call from someone here. I gave her the information from [facility] note.

## 2019-12-28 NOTE — Telephone Encounter (Signed)
Let her know I spoke with Dr. Maryjean Ka  He notes that they have offered other appointments sooner with other providers, Simeon Craft or Leonie Green, since her normal provider did not have opening at the time they requested.  So if she is running out or has run out of medicine I recommend she call their office back and work out what ever plan they can get for appointment which possibly would include seeing another provider in the short-term to refill medications.  I wanted to pass this info onto her.  Audelia Acton

## 2020-01-12 ENCOUNTER — Other Ambulatory Visit: Payer: Self-pay | Admitting: Obstetrics and Gynecology

## 2020-01-13 ENCOUNTER — Other Ambulatory Visit: Payer: Self-pay | Admitting: Medical

## 2020-01-15 NOTE — Telephone Encounter (Signed)
[facility] is requesting to fill pt cymbalta . Please advise [facility]

## 2020-01-16 DIAGNOSIS — M961 Postlaminectomy syndrome, not elsewhere classified: Secondary | ICD-10-CM | POA: Diagnosis not present

## 2020-01-18 ENCOUNTER — Other Ambulatory Visit: Payer: Self-pay | Admitting: Medical

## 2020-01-31 ENCOUNTER — Other Ambulatory Visit: Payer: Self-pay | Admitting: Medical

## 2020-02-12 DIAGNOSIS — N950 Postmenopausal bleeding: Secondary | ICD-10-CM | POA: Diagnosis not present

## 2020-02-12 DIAGNOSIS — N898 Other specified noninflammatory disorders of vagina: Secondary | ICD-10-CM | POA: Diagnosis not present

## 2020-02-21 DIAGNOSIS — M5416 Radiculopathy, lumbar region: Secondary | ICD-10-CM | POA: Diagnosis not present

## 2020-02-23 ENCOUNTER — Ambulatory Visit: Payer: Medicare Other | Admitting: Podiatry

## 2020-03-07 DIAGNOSIS — Z961 Presence of intraocular lens: Secondary | ICD-10-CM | POA: Diagnosis not present

## 2020-03-07 DIAGNOSIS — H401133 Primary open-angle glaucoma, bilateral, severe stage: Secondary | ICD-10-CM | POA: Diagnosis not present

## 2020-03-13 ENCOUNTER — Other Ambulatory Visit: Payer: Self-pay | Admitting: Medical

## 2020-03-15 ENCOUNTER — Ambulatory Visit (INDEPENDENT_AMBULATORY_CARE_PROVIDER_SITE_OTHER): Payer: Medicare Other | Admitting: Podiatry

## 2020-03-15 ENCOUNTER — Encounter: Payer: Self-pay | Admitting: Podiatry

## 2020-03-15 ENCOUNTER — Other Ambulatory Visit: Payer: Self-pay

## 2020-03-15 DIAGNOSIS — B351 Tinea unguium: Secondary | ICD-10-CM

## 2020-03-15 DIAGNOSIS — R234 Changes in skin texture: Secondary | ICD-10-CM | POA: Diagnosis not present

## 2020-03-15 DIAGNOSIS — M79674 Pain in right toe(s): Secondary | ICD-10-CM

## 2020-03-15 DIAGNOSIS — M79675 Pain in left toe(s): Secondary | ICD-10-CM | POA: Diagnosis not present

## 2020-03-15 DIAGNOSIS — E1142 Type 2 diabetes mellitus with diabetic polyneuropathy: Secondary | ICD-10-CM

## 2020-03-15 NOTE — Patient Instructions (Signed)
Apply Neosporin Cream under right 5th digit daily until healed.  Apply Zeasorb Powder to right 4th webspace daily.  Call office if you have any problems with redness, pus, drainage, swelling or increased pain.

## 2020-03-19 ENCOUNTER — Other Ambulatory Visit: Payer: Self-pay | Admitting: Family Medicine

## 2020-03-19 ENCOUNTER — Other Ambulatory Visit: Payer: Self-pay | Admitting: Medical

## 2020-03-19 NOTE — Telephone Encounter (Signed)
[facility] is requesting to fill pt cymbalta. Please advise [facility]

## 2020-03-22 NOTE — Progress Notes (Signed)
Subjective: Robyn Hill presents today at risk foot care with history of diabetic neuropathy and painful mycotic nails b/l that are difficult to trim. Pain interferes with ambulation. Aggravating factors include wearing enclosed shoe gear. Pain is relieved with periodic professional debridement.   Today, she states she has pain of right 5th digit. She feels there is a sore there. She has been applying Neosporin Cream and Zeasorb Powder.  States she has cut all toenails except b/l great toes which she cannot cut and is afraid.  Tysinger, Camelia Eng, PA-C is patient's PCP. Last visit was:11/16/2019.  Past Medical History:  Diagnosis Date  . Adrenal mass, right ([place])   . Allergy   . Anemia   . Asthma    "as a teenager"  . Blind right eye    since childhood  . Blood transfusion 1970  . Chronic back pain   . Chronic diarrhea    Colestipol therapy  . Depression   . Diverticulitis   . Diverticulosis   . Fatty liver   . Female bladder prolapse   . GERD (gastroesophageal reflux disease)   . Glaucoma   . H/O bone density study 11/29/2014   normal study although mild decreased in density from prior study  . H/O hiatal hernia   . Hemorrhoids   . History of kidney stones   . History of uterine cancer    s/p hysterectomy  . Hyperlipidemia   . Hypertension   . Migraine    hx of migraines in past   . Neuromuscular disorder ([place])    diabetic neuropahthy  . Numbness and tingling of both legs   . Osteoarthritis   . Rheumatic fever    age 40  . Type II diabetes mellitus ([place])   . Urinary incontinence   . Vitamin D deficiency      Current Outpatient Medications on File Prior to Visit  Medication Sig Dispense Refill  . Accu-Chek FastClix Lancets MISC Check sugar twice daily. 102 each 6  . B-D UF III MINI PEN NEEDLES 31G X 5 MM MISC USE TO INJECT TOUJEO AS DIRECTED 100 each 0  . Blood Glucose Calibration (ACCU-CHEK INSTANT CONTROL) LIQD Use for controls for accu-chek activa meter 1 each  0  . Blood Glucose Monitoring Suppl (ACCU-CHEK GUIDE ME) w/Device KIT 1 Device by Does not apply route 2 (two) times daily at 8 am and 10 pm. 1 kit 0  . brimonidine (ALPHAGAN) 0.2 % ophthalmic solution Place 1 drop into the left eye 2 times daily.    . celecoxib (CELEBREX) 200 MG capsule Take 200 mg by mouth 2 (two) times daily.    . Cholecalciferol (VITAMIN D-3) 25 MCG (1000 UT) CAPS Take 1 capsule (1,000 Units total) by mouth daily. 90 capsule 3  . DEXILANT 60 MG capsule Take 1 capsule by mouth twice daily 180 capsule 0  . dorzolamide-timolol (COSOPT) 22.3-6.8 MG/ML ophthalmic solution Place 1 drop into both eyes 2 (two) times daily.    . enalapril (VASOTEC) 20 MG tablet Take 1 tablet by mouth once daily 90 tablet 0  . Exenatide ER (BYDUREON BCISE) 2 MG/0.85ML AUIJ Inject 2 mg into the skin once a week. 15 mL 2  . glucose blood (ACCU-CHEK GUIDE) test strip Use as instructed, check once to twice daily 100 each 12  . hydrochlorothiazide (HYDRODIURIL) 25 MG tablet Take 1 tablet by mouth once daily 90 tablet 0  . hydrocortisone (PROCTOSOL HC) 2.5 % rectal cream Place 1 application rectally 2 (  two) times daily. (Patient not taking: Reported on 11/16/2019) 30 g 2  . HYDROmorphone (DILAUDID) 4 MG tablet Take 4 mg by mouth every 6 (six) hours as needed.    . latanoprost (XALATAN) 0.005 % ophthalmic solution Place 1 drop into the right eye at bedtime.    . levETIRAcetam (KEPPRA) 250 MG tablet Take 250 mg by mouth daily.    . metFORMIN (GLUCOPHAGE) 500 MG tablet TAKE 1 TABLET BY MOUTH TWICE DAILY WITH MEALS 180 tablet 0  . mupirocin ointment (BACTROBAN) 2 % APPLY OINTMENT TOPICALLY TWICE DAILY TO OPEN SKIN LESIONS    . NEEDLE, DISP, 30 G (BD DISP NEEDLES) 30G X 1/2" MISC 1 each by Does not apply route daily. 50 each 11  . TOUJEO SOLOSTAR 300 UNIT/ML Solostar Pen INJECT 22 UNITS SUBCUTANEOUSLY ONCE DAILY 6 mL 0  . triamcinolone cream (KENALOG) 0.1 % Apply 1 application topically as needed (for skin  irritation). Reported on 04/24/2016     No current facility-administered medications on file prior to visit.     Allergies  Allergen Reactions  . Kiwi Extract Hives  . Lyrica [Pregabalin] Other (See Comments)    psychosis  . Sunflowerseed Oil Hives  . Hydrocodone-Acetaminophen Nausea Only    "Extended release form only"  . Invokana [Canagliflozin] Other (See Comments)    UTI  . Neurontin [Gabapentin] Other (See Comments)    Intolerance/ineffective  . Nitrofuran Derivatives Nausea Only and Other (See Comments)    stomach pain and weight loss  . Tape Other (See Comments)    Pulls skin off, can use paper tape    Objective: Robyn Hill is a pleasant 74 y.o. y.o. Patient Race: White or Caucasian [1]  female in NAD. AAO x 3.  There were no vitals filed for this visit.  Vascular Examination: Neurovascular status unchanged b/l lower extremities. Capillary refill time to digits immediate b/l. Palpable DP pulses b/l. Palpable PT pulses b/l. Pedal hair absent b/l. Skin temperature gradient within normal limits b/l. No pain with calf compression b/l.  Dermatological Examination: Pedal skin with normal turgor, texture and tone bilaterally. No open wounds bilaterally. No interdigital macerations bilaterally. Toenails L hallux and R hallux elongated, discolored, dystrophic, thickened, and crumbly with subungual debris and tenderness to dorsal palpation; remaining toenails recently debrided..  Transverse superficial skin crack noted plantar right 5th digit. No erythema, no edema, no drainage. 4th webspace right foot with minimal maceration. No erythema, no edema, no drainage.  Musculoskeletal: Normal muscle strength 5/5 to all lower extremity muscle groups bilaterally. No pain crepitus or joint limitation noted with ROM b/l. Hallux valgus with bunion deformity noted b/l lower extremities.  Neurological Examination: Protective sensation intact 5/5 intact bilaterally with 10g monofilament  b/l. Proprioception intact bilaterally.  Assessment: 1. Pain due to onychomycosis of toenails of both feet   2. Cracked skin   3. Diabetic peripheral neuropathy associated with type 2 diabetes mellitus ([place])    Plan: -Examined patient. -Discussed skin crack right 5th digit. Cleansed digit with alcohol. Triple antibiotic ointment applied. She was instructed to apply antibiotic ointment to skin crack until healed. Call if condition worsens. She may apply the Zeasorb Powder between 4th and 5th digit once daily. -Toenails L hallux and R hallux debrided in length and girth without iatrogenic bleeding with sterile nail nipper and dremel.  -Patient to continue soft, supportive shoe gear daily. -Patient to report any pedal injuries to medical professional immediately. -Patient/POA to call should there be question/concern in the interim.  Return in about 10 weeks (around 05/24/2020) for diabetic nail trim.  Marzetta Board, DPM

## 2020-03-27 DIAGNOSIS — L570 Actinic keratosis: Secondary | ICD-10-CM | POA: Diagnosis not present

## 2020-04-23 ENCOUNTER — Other Ambulatory Visit: Payer: Self-pay | Admitting: Medical

## 2020-04-23 DIAGNOSIS — E118 Type 2 diabetes mellitus with unspecified complications: Secondary | ICD-10-CM

## 2020-05-27 ENCOUNTER — Ambulatory Visit: Payer: Medicare Other | Admitting: Podiatry

## 2020-06-02 ENCOUNTER — Other Ambulatory Visit: Payer: Self-pay | Admitting: Neurology

## 2020-06-05 ENCOUNTER — Encounter: Payer: Self-pay | Admitting: Medical

## 2020-06-05 ENCOUNTER — Other Ambulatory Visit: Payer: Self-pay

## 2020-06-05 ENCOUNTER — Ambulatory Visit (INDEPENDENT_AMBULATORY_CARE_PROVIDER_SITE_OTHER): Payer: Medicare Other | Admitting: Medical

## 2020-06-05 VITALS — BP 128/70 | HR 74 | Ht 65.0 in | Wt 200.8 lb

## 2020-06-05 DIAGNOSIS — Z7189 Other specified counseling: Secondary | ICD-10-CM

## 2020-06-05 DIAGNOSIS — R258 Other abnormal involuntary movements: Secondary | ICD-10-CM

## 2020-06-05 DIAGNOSIS — E1142 Type 2 diabetes mellitus with diabetic polyneuropathy: Secondary | ICD-10-CM

## 2020-06-05 DIAGNOSIS — R32 Unspecified urinary incontinence: Secondary | ICD-10-CM

## 2020-06-05 DIAGNOSIS — M1990 Unspecified osteoarthritis, unspecified site: Secondary | ICD-10-CM | POA: Diagnosis not present

## 2020-06-05 DIAGNOSIS — E118 Type 2 diabetes mellitus with unspecified complications: Secondary | ICD-10-CM

## 2020-06-05 DIAGNOSIS — E785 Hyperlipidemia, unspecified: Secondary | ICD-10-CM

## 2020-06-05 DIAGNOSIS — M5442 Lumbago with sciatica, left side: Secondary | ICD-10-CM

## 2020-06-05 DIAGNOSIS — K219 Gastro-esophageal reflux disease without esophagitis: Secondary | ICD-10-CM

## 2020-06-05 DIAGNOSIS — M62838 Other muscle spasm: Secondary | ICD-10-CM

## 2020-06-05 DIAGNOSIS — E559 Vitamin D deficiency, unspecified: Secondary | ICD-10-CM

## 2020-06-05 DIAGNOSIS — I10 Essential (primary) hypertension: Secondary | ICD-10-CM | POA: Diagnosis not present

## 2020-06-05 DIAGNOSIS — M4316 Spondylolisthesis, lumbar region: Secondary | ICD-10-CM | POA: Diagnosis not present

## 2020-06-05 DIAGNOSIS — Z9889 Other specified postprocedural states: Secondary | ICD-10-CM

## 2020-06-05 DIAGNOSIS — D638 Anemia in other chronic diseases classified elsewhere: Secondary | ICD-10-CM

## 2020-06-05 DIAGNOSIS — E2839 Other primary ovarian failure: Secondary | ICD-10-CM | POA: Diagnosis not present

## 2020-06-05 DIAGNOSIS — R109 Unspecified abdominal pain: Secondary | ICD-10-CM

## 2020-06-05 DIAGNOSIS — Z780 Asymptomatic menopausal state: Secondary | ICD-10-CM | POA: Diagnosis not present

## 2020-06-05 DIAGNOSIS — H409 Unspecified glaucoma: Secondary | ICD-10-CM

## 2020-06-05 DIAGNOSIS — R0789 Other chest pain: Secondary | ICD-10-CM

## 2020-06-05 DIAGNOSIS — Z7185 Encounter for immunization safety counseling: Secondary | ICD-10-CM

## 2020-06-05 DIAGNOSIS — G8929 Other chronic pain: Secondary | ICD-10-CM

## 2020-06-05 DIAGNOSIS — Z1231 Encounter for screening mammogram for malignant neoplasm of breast: Secondary | ICD-10-CM

## 2020-06-05 DIAGNOSIS — N8110 Cystocele, unspecified: Secondary | ICD-10-CM

## 2020-06-05 MED ORDER — VITAMIN D-3 25 MCG (1000 UT) PO CAPS: 1.0000 | ORAL_CAPSULE | Freq: Every day | ORAL | 3 refills | Status: DC

## 2020-06-05 MED ORDER — FERROUS GLUCONATE 324 (38 FE) MG PO TABS: 324.0000 mg | ORAL_TABLET | Freq: Every day | ORAL | 1 refills | Status: DC

## 2020-06-05 MED ORDER — DULOXETINE HCL 60 MG PO CPEP: 60.0000 mg | ORAL_CAPSULE | Freq: Every day | ORAL | 3 refills | Status: DC

## 2020-06-05 MED ORDER — HYDROCHLOROTHIAZIDE 25 MG PO TABS: 25.0000 mg | ORAL_TABLET | Freq: Every day | ORAL | 3 refills | Status: DC

## 2020-06-05 MED ORDER — ENALAPRIL MALEATE 20 MG PO TABS: 20.0000 mg | ORAL_TABLET | Freq: Every day | ORAL | 3 refills | Status: DC

## 2020-06-05 MED ORDER — METFORMIN HCL 500 MG PO TABS: 500.0000 mg | ORAL_TABLET | Freq: Two times a day (BID) | ORAL | 3 refills | Status: DC

## 2020-06-05 MED ORDER — ATORVASTATIN CALCIUM 40 MG PO TABS: 40.0000 mg | ORAL_TABLET | Freq: Every day | ORAL | 3 refills | Status: DC

## 2020-06-05 MED ORDER — BACLOFEN 10 MG PO TABS: 10.0000 mg | ORAL_TABLET | Freq: Every evening | ORAL | 0 refills | Status: DC | PRN

## 2020-06-05 NOTE — Progress Notes (Signed)
Subjective:    Robyn Hill is a 74 y.o. female who presents for Preventative Services visit and chronic medical problems/med check visit.    Primary Care Provider Robyn Hill, Robyn Eng, PA-C here for primary care  Current Health Care Team:  Dentist, Dr. Joya Hill  Eye doctor, Dr. Delman Hill  Dr. Clydell Hill, neurosurgery/pain management  Dr. Minus Hill, cardiology  Dr. Margette Hill, neurology  Dr. Vertell Hill, neurosurgery  Dr. Acquanetta Hill, podiatry  Dr. Bjorn Hill, urology   Concerns: Here with husband for routine med check  She was seeing Dr. Jannifer Hill but is no longer in his care.  She would like a refill on the baclofen he was prescribing.  This helps without much sedation or other problems.  Her pain management Dr. Maryjean Hill recently left the practice so she is having to establish with Dr. Davy Hill at neurosurgery  She is compliant with medications in general . She has been having some pains in her right chest wall/ribs, right lower abdomen, right flank.  She has ongoing urinary incontinence, history of bladder prolapse, saw urology recently.  She denies fall or injury or trauma.  She wants Covid antibody testing to see if she still has antibodies from having Covid several months back  Here for surveillance labs  Past Medical History:  Diagnosis Date  . Adrenal mass, right ([place])   . Allergy   . Anemia   . Asthma    "as a teenager"  . Blind right eye    since childhood  . Blood transfusion 1970  . Chronic back pain   . Chronic diarrhea    Colestipol therapy  . Depression   . Diverticulitis   . Diverticulosis   . Fatty liver   . Female bladder prolapse   . GERD (gastroesophageal reflux disease)   . Glaucoma   . H/O bone density study 11/29/2014   normal study although mild decreased in density from prior study  . H/O hiatal hernia   . Hemorrhoids   . History of kidney stones   . History of uterine cancer    s/p hysterectomy  . Hyperlipidemia   .  Hypertension   . Migraine    hx of migraines in past   . Neuromuscular disorder ([place])    diabetic neuropahthy  . Numbness and tingling of both legs   . Osteoarthritis   . Rheumatic fever    age 40  . Type II diabetes mellitus ([place])   . Urinary incontinence   . Vitamin D deficiency      History reviewed: allergies, current medications, past family history, past medical history, past social history, past surgical history and problem list  ROS as in subjective    Objective:     Biometrics BP 128/70   Pulse 74   Ht 5\' 5"  (1.651 m)   Wt 200 lb 12.8 oz (91.1 kg)   SpO2 93%   BMI 33.41 kg/m    BP Readings from Last 3 Encounters:  06/05/20 128/70  11/16/19 120/66  07/17/19 136/78    Wt Readings from Last 3 Encounters:  06/05/20 200 lb 12.8 oz (91.1 kg)  11/16/19 204 lb 12.8 oz (92.9 kg)  10/11/19 200 lb (90.7 kg)    Gen: wd, wn, nad Somewhat tender right lower abdomen, right flank, right mid to lower back as well as right lateral chest wall and anterior right chest wall Surgical scars noted diagonally on right mid to lower back in 2 locations Lungs clear   Assessment:  Encounter Diagnoses  Name Primary?  . Diabetes mellitus with complication ([place]) Yes  . Diabetic polyneuropathy associated with type 2 diabetes mellitus ([place])   . Hyperlipidemia, unspecified hyperlipidemia type   . Essential hypertension, benign   . Post-menopausal   . Estrogen deficiency   . Advice given about COVID-19 virus infection   . Vaccine counseling   . Gastroesophageal reflux disease without esophagitis   . Spondylolisthesis of lumbar region   . Arthritis   . Prolapse of female bladder, acquired   . Chronic bilateral low back pain with left-sided sciatica   . Glaucoma, unspecified glaucoma type, unspecified laterality   . Anemia of chronic disease   . Nocturnal leg movements   . Vitamin D deficiency   . History of back surgery   . Abdominal pain, unspecified abdominal location     . Urinary incontinence, unspecified type   . Encounter for screening mammogram for malignant neoplasm of breast   . Muscle spasm   . Chest wall pain      Plan:   Diabetes-continue current therapy, daily foot checks, routine follow-up, labs today  Hypertension-continue current medication  Hyperlipidemia-continue current medication  Abdominal pain and discomfort, chest wall pain -etiology unclear.  Pending labs consider imaging, consider adhesions, urinary tract infection or other, consider rib x-ray or CT given the rib pain  She requested Covid antibody testing since she had Covid infection a few months back  She will call and schedule mammogram and bone density scan at the [facility] center in [facility]  She will be seeing Dr. Davy Hill who took Dr. Maryjean Hill place a neurosurgery for pain control  She has follow-up planned with urology given prolapse and incontinence    Robyn Hill was seen today for diabetes and abdominal pain.  Diagnoses and all orders for this visit:  Diabetes mellitus with complication ([place])  Diabetic polyneuropathy associated with type 2 diabetes mellitus ([place]) -     Hemoglobin A1c -     Microalbumin/Creatinine Ratio, Urine  Hyperlipidemia, unspecified hyperlipidemia type -     Lipid panel -     Comprehensive metabolic panel  Essential hypertension, benign -     Comprehensive metabolic panel -     Microalbumin/Creatinine Ratio, Urine -     CBC with Differential/Platelet -     POCT Urinalysis DIP (Proadvantage Device)  Post-menopausal  Estrogen deficiency  Advice given about COVID-19 virus infection -     CBC with Differential/Platelet -     SAR CoV2 Serology (COVID 19)AB(IGG)IA  Vaccine counseling  Gastroesophageal reflux disease without esophagitis  Spondylolisthesis of lumbar region  Arthritis  Prolapse of female bladder, acquired  Chronic bilateral low back pain with left-sided sciatica  Glaucoma, unspecified glaucoma type,  unspecified laterality  Anemia of chronic disease  Nocturnal leg movements  Vitamin D deficiency  History of back surgery  Abdominal pain, unspecified abdominal location  Urinary incontinence, unspecified type  Encounter for screening mammogram for malignant neoplasm of breast  Muscle spasm  Chest wall pain  Other orders -     atorvastatin (LIPITOR) 40 MG tablet; Take 1 tablet (40 mg total) by mouth at bedtime. -     baclofen (LIORESAL) 10 MG tablet; Take 1 tablet (10 mg total) by mouth at bedtime as needed for muscle spasms. -     Cholecalciferol (VITAMIN D-3) 25 MCG (1000 UT) CAPS; Take 1 capsule (1,000 Units total) by mouth daily. -     DULoxetine (CYMBALTA) 60 MG capsule; Take 1 capsule (60  mg total) by mouth daily. -     enalapril (VASOTEC) 20 MG tablet; Take 1 tablet (20 mg total) by mouth daily. -     ferrous gluconate (FERGON) 324 MG tablet; Take 1 tablet (324 mg total) by mouth daily with breakfast. -     hydrochlorothiazide (HYDRODIURIL) 25 MG tablet; Take 1 tablet (25 mg total) by mouth daily. -     metFORMIN (GLUCOPHAGE) 500 MG tablet; Take 1 tablet (500 mg total) by mouth 2 (two) times daily with a meal.   F/u pending labs

## 2020-06-06 ENCOUNTER — Other Ambulatory Visit: Payer: Self-pay | Admitting: Medical

## 2020-06-06 DIAGNOSIS — R0781 Pleurodynia: Secondary | ICD-10-CM

## 2020-06-06 DIAGNOSIS — R109 Unspecified abdominal pain: Secondary | ICD-10-CM

## 2020-06-06 DIAGNOSIS — R0789 Other chest pain: Secondary | ICD-10-CM

## 2020-06-06 LAB — LIPID PANEL
Chol/HDL Ratio: 3 ratio (ref 0.0–4.4)
Cholesterol, Total: 163 mg/dL (ref 100–199)
HDL: 55 mg/dL (ref >39)
LDL Chol Calc (NIH): 86 mg/dL (ref 0–99)
Triglycerides: 126 mg/dL (ref 0–149)
VLDL Cholesterol Cal: 22 mg/dL (ref 5–40)

## 2020-06-06 LAB — COMPREHENSIVE METABOLIC PANEL
ALT: 100 IU/L — ABNORMAL HIGH (ref 0–32)
AST: 62 IU/L — ABNORMAL HIGH (ref 0–40)
Albumin/Globulin Ratio: 1.8 (ref 1.2–2.2)
Albumin: 4.8 g/dL — ABNORMAL HIGH (ref 3.7–4.7)
Alkaline Phosphatase: 168 IU/L — ABNORMAL HIGH (ref 48–121)
BUN/Creatinine Ratio: 33 — ABNORMAL HIGH (ref 12–28)
BUN: 28 mg/dL — ABNORMAL HIGH (ref 8–27)
Bilirubin Total: 0.4 mg/dL (ref 0.0–1.2)
CO2: 28 mmol/L (ref 20–29)
Calcium: 10.5 mg/dL — ABNORMAL HIGH (ref 8.7–10.3)
Chloride: 97 mmol/L (ref 96–106)
Creatinine, Ser: 0.84 mg/dL (ref 0.57–1.00)
GFR calc Af Amer: 79 mL/min/{1.73_m2} (ref >59)
GFR calc non Af Amer: 69 mL/min/{1.73_m2} (ref >59)
Globulin, Total: 2.7 g/dL (ref 1.5–4.5)
Glucose: 129 mg/dL — ABNORMAL HIGH (ref 65–99)
Potassium: 5 mmol/L (ref 3.5–5.2)
Sodium: 139 mmol/L (ref 134–144)
Total Protein: 7.5 g/dL (ref 6.0–8.5)

## 2020-06-06 LAB — CBC WITH DIFFERENTIAL/PLATELET
Basophils Absolute: 0.1 10*3/uL (ref 0.0–0.2)
Basos: 1 %
EOS (ABSOLUTE): 0.4 10*3/uL (ref 0.0–0.4)
Eos: 4 %
Hematocrit: 43.8 % (ref 34.0–46.6)
Hemoglobin: 14.3 g/dL (ref 11.1–15.9)
Immature Grans (Abs): 0 10*3/uL (ref 0.0–0.1)
Immature Granulocytes: 0 %
Lymphocytes Absolute: 3.6 10*3/uL — ABNORMAL HIGH (ref 0.7–3.1)
Lymphs: 29 %
MCH: 27.8 pg (ref 26.6–33.0)
MCHC: 32.6 g/dL (ref 31.5–35.7)
MCV: 85 fL (ref 79–97)
Monocytes Absolute: 0.9 10*3/uL (ref 0.1–0.9)
Monocytes: 7 %
Neutrophils Absolute: 7.2 10*3/uL — ABNORMAL HIGH (ref 1.4–7.0)
Neutrophils: 59 %
Platelets: 331 10*3/uL (ref 150–450)
RBC: 5.14 x10E6/uL (ref 3.77–5.28)
RDW: 11.9 % (ref 11.7–15.4)
WBC: 12.3 10*3/uL — ABNORMAL HIGH (ref 3.4–10.8)

## 2020-06-06 LAB — SAR COV2 SEROLOGY (COVID19)AB(IGG),IA: DiaSorin SARS-CoV-2 Ab, IgG: POSITIVE

## 2020-06-06 LAB — MICROALBUMIN / CREATININE URINE RATIO
Creatinine, Urine: 92.1 mg/dL
Microalb/Creat Ratio: 8 mg/g creat (ref 0–29)
Microalbumin, Urine: 7.6 ug/mL

## 2020-06-06 LAB — HEMOGLOBIN A1C
Est. average glucose Bld gHb Est-mCnc: 163 mg/dL
Hgb A1c MFr Bld: 7.3 % — ABNORMAL HIGH (ref 4.8–5.6)

## 2020-06-07 ENCOUNTER — Encounter: Payer: Self-pay | Admitting: Medical

## 2020-06-27 ENCOUNTER — Other Ambulatory Visit: Payer: Medicare Other

## 2020-06-28 ENCOUNTER — Other Ambulatory Visit: Payer: Self-pay | Admitting: Medical

## 2020-07-02 DIAGNOSIS — Z9689 Presence of other specified functional implants: Secondary | ICD-10-CM | POA: Diagnosis not present

## 2020-07-02 DIAGNOSIS — I10 Essential (primary) hypertension: Secondary | ICD-10-CM | POA: Diagnosis not present

## 2020-07-02 DIAGNOSIS — M961 Postlaminectomy syndrome, not elsewhere classified: Secondary | ICD-10-CM | POA: Diagnosis not present

## 2020-07-02 DIAGNOSIS — M5416 Radiculopathy, lumbar region: Secondary | ICD-10-CM | POA: Diagnosis not present

## 2020-07-02 DIAGNOSIS — Z6836 Body mass index (BMI) 36.0-36.9, adult: Secondary | ICD-10-CM | POA: Diagnosis not present

## 2020-07-08 ENCOUNTER — Telehealth: Payer: Self-pay | Admitting: Medical

## 2020-07-08 NOTE — Telephone Encounter (Signed)
Pt called and states that she needs a order from [place] to get the bone density test at the [facility] center in [place] before they will schedule her there fax number is [phone] She can be reached at [phone]

## 2020-07-09 NOTE — Telephone Encounter (Signed)
Write order on script, stamp my sig, then fax to [facility] center  [facility] Address: [address], [place], [place] [address] Hours:  Closed ? Opens 8AM Phone: [phone] ext. [phone]

## 2020-07-10 ENCOUNTER — Other Ambulatory Visit: Payer: Self-pay | Admitting: Medical

## 2020-07-11 NOTE — Telephone Encounter (Signed)
Done

## 2020-07-16 ENCOUNTER — Ambulatory Visit
Admission: RE | Admit: 2020-07-16 | Discharge: 2020-07-16 | Disposition: A | Discharge status: Home / Self Care | Payer: Medicare Other | Source: Ambulatory Visit | Attending: Medical | Admitting: Medical

## 2020-07-16 ENCOUNTER — Other Ambulatory Visit: Payer: Medicare Other

## 2020-07-16 DIAGNOSIS — R7989 Other specified abnormal findings of blood chemistry: Secondary | ICD-10-CM | POA: Diagnosis not present

## 2020-07-16 DIAGNOSIS — R0781 Pleurodynia: Secondary | ICD-10-CM

## 2020-07-16 DIAGNOSIS — D3500 Benign neoplasm of unspecified adrenal gland: Secondary | ICD-10-CM | POA: Diagnosis not present

## 2020-07-16 DIAGNOSIS — R0789 Other chest pain: Secondary | ICD-10-CM

## 2020-07-16 DIAGNOSIS — R109 Unspecified abdominal pain: Secondary | ICD-10-CM

## 2020-07-16 DIAGNOSIS — R079 Chest pain, unspecified: Secondary | ICD-10-CM | POA: Diagnosis not present

## 2020-07-16 DIAGNOSIS — D3501 Benign neoplasm of right adrenal gland: Secondary | ICD-10-CM | POA: Diagnosis not present

## 2020-07-16 MED ORDER — IOPAMIDOL (ISOVUE-300) INJECTION 61%
100.0000 mL | Freq: Once | INTRAVENOUS | Status: AC | PRN
  Administered 2020-07-16: 100 mL via INTRAVENOUS

## 2020-07-22 ENCOUNTER — Encounter: Payer: Self-pay | Admitting: Medical

## 2020-07-22 ENCOUNTER — Other Ambulatory Visit: Payer: Self-pay

## 2020-07-22 ENCOUNTER — Ambulatory Visit (INDEPENDENT_AMBULATORY_CARE_PROVIDER_SITE_OTHER): Payer: Medicare Other | Admitting: Medical

## 2020-07-22 ENCOUNTER — Telehealth: Payer: Self-pay | Admitting: Medical

## 2020-07-22 VITALS — BP 140/72 | HR 65 | Ht 65.0 in | Wt 201.8 lb

## 2020-07-22 DIAGNOSIS — R1310 Dysphagia, unspecified: Secondary | ICD-10-CM

## 2020-07-22 DIAGNOSIS — K219 Gastro-esophageal reflux disease without esophagitis: Secondary | ICD-10-CM | POA: Diagnosis not present

## 2020-07-22 DIAGNOSIS — R221 Localized swelling, mass and lump, neck: Secondary | ICD-10-CM | POA: Diagnosis not present

## 2020-07-22 DIAGNOSIS — K449 Diaphragmatic hernia without obstruction or gangrene: Secondary | ICD-10-CM | POA: Insufficient documentation

## 2020-07-22 DIAGNOSIS — Z23 Encounter for immunization: Secondary | ICD-10-CM | POA: Insufficient documentation

## 2020-07-22 NOTE — Progress Notes (Signed)
Established patient visit   Patient: Robyn Hill   DOB: 1945/12/09   74 y.o. Female  MRN: [id number] Visit Date: 07/22/2020  Today's healthcare provider: Dorothea Ogle, PA-C   Chief Complaint  Patient presents with  . Dysphagia    has to turn head to the right   . Flu Vaccine    HD   I,Shane Tysinger,acting as a scribe for [employer], PA-C.,have documented all relevant documentation on the behalf of Dorothea Ogle, PA-C,as directed by  [employer], PA-C while in the presence of [employer], PA-C.  Subjective    HPI HPI    Dysphagia     Additional comments: has to turn head to the right           Flu Vaccine     Additional comments: HD        Last edited by Edgar Frisk, CMA on 07/22/2020  8:30 AM. (History)      Dysphagia Patient presents today for dysphagia and painful with swallowing. Patient reports she has to turn her head to the right side when drinking liquids mainly. Eating meat not throwing up. No vomiting, heartburn or belching, no constipation or diarrhea. Some tender to touch when neck was evaluated in office and patient report she saw some swelling. Patient had a CT chest scan done on 07/16/2020.  The symptoms have been going on for at least a month or more. Initially she thought it was just a sore throat but the symptoms have persisted. At one point she felt swelling in the left neck but denies feeling a lump. No lymphadenopathy noted at home. She is compliant with Dexilant regularly. She avoids spicy and acidic foods.  Medications: Outpatient Medications Prior to Visit  Medication Sig  . Accu-Chek FastClix Lancets MISC Check sugar twice daily.  Marland Kitchen aspirin EC 81 MG tablet Take 81 mg by mouth daily. Swallow whole.  Marland Kitchen atorvastatin (LIPITOR) 40 MG tablet Take 1 tablet (40 mg total) by mouth at bedtime.  . B-D UF III MINI PEN NEEDLES 31G X 5 MM MISC USE TO INJECT TOUJEO AS DIRECTED  . baclofen (LIORESAL) 10 MG tablet Take 1 tablet (10 mg total)  by mouth at bedtime as needed for muscle spasms.  . Blood Glucose Calibration (ACCU-CHEK INSTANT CONTROL) LIQD Use for controls for accu-chek activa meter  . Blood Glucose Monitoring Suppl (ACCU-CHEK GUIDE ME) w/Device KIT 1 Device by Does not apply route 2 (two) times daily at 8 am and 10 pm.  . brimonidine (ALPHAGAN) 0.2 % ophthalmic solution Place 1 drop into the left eye 2 times daily.  Marland Kitchen BYDUREON BCISE 2 MG/0.85ML AUIJ INJECT CONTENTS OF 1 PEN (2MG) INTO THE SKIN ONCE A WEEK  . celecoxib (CELEBREX) 200 MG capsule Take 200 mg by mouth 2 (two) times daily.  . Cholecalciferol (VITAMIN D-3) 25 MCG (1000 UT) CAPS Take 1 capsule (1,000 Units total) by mouth daily.  Marland Kitchen DEXILANT 60 MG capsule Take 1 capsule by mouth twice daily  . dorzolamide-timolol (COSOPT) 22.3-6.8 MG/ML ophthalmic solution Place 1 drop into both eyes 2 (two) times daily.  . DULoxetine (CYMBALTA) 60 MG capsule Take 1 capsule (60 mg total) by mouth daily.  . enalapril (VASOTEC) 20 MG tablet Take 1 tablet (20 mg total) by mouth daily.  . ferrous gluconate (FERGON) 324 MG tablet Take 1 tablet (324 mg total) by mouth daily with breakfast.  . glucose blood (ACCU-CHEK GUIDE) test strip USE 1 CLEAN STRIP TO CHECK BLOOD  GLUCOSE TWICE DAILY  . hydrochlorothiazide (HYDRODIURIL) 25 MG tablet Take 1 tablet (25 mg total) by mouth daily.  . hydrocortisone (PROCTOSOL HC) 2.5 % rectal cream Place 1 application rectally 2 (two) times daily.  Marland Kitchen HYDROmorphone (DILAUDID) 4 MG tablet Take 4 mg by mouth every 6 (six) hours as needed.  . latanoprost (XALATAN) 0.005 % ophthalmic solution Place 1 drop into the right eye at bedtime.  . metFORMIN (GLUCOPHAGE) 500 MG tablet Take 1 tablet (500 mg total) by mouth 2 (two) times daily with a meal.  . TOUJEO SOLOSTAR 300 UNIT/ML Solostar Pen INJECT 22 UNITS SUBCUTANEOUSLY ONCE DAILY   No facility-administered medications prior to visit.    Review of Systems As in subjective   Objective    BP 140/72    Pulse 65   Ht _0  (1.651 m)   Wt 201 lb 12.8 oz (91.5 kg)   SpO2 93%   BMI 33.58 kg/m    Physical Exam  General well-developed well-nourished no acute stress Neck: Tender over the larynx but no obvious mass or swelling, no obvious lymphadenopathy, no obvious deformity, she does seem to be in some pain when swallowing, no thyromegaly, range of motion to the left seems slightly reduced Oral: No obvious oral lesions, throat without erythema, no tonsillar abnormal finding    No results found for any visits on 07/22/20.  Assessment & Plan    1. Dysphagia, unspecified type We discussed possible differential. She apparently had some swelling that is not present now. So question lymphadenopathy, versus GERD versus swallowing abnormality, versus mass or other. Consider swallow study. She had a recent CT chest. I will review over that further to see if we need to do any other scan or contact radiology to see if they saw any other abnormality of the neck  Hiatal hernia noted on chest CT from September 2021  Atherosclerosis noted on recent chest CT-continue statin and risk factor modification  Counseled on the influenza virus vaccine.  Vaccine information sheet given.   High dose Influenza vaccine given after consent obtained.  Addendum: I called and spoke to [facility] imaging about the CT chest done on July 16, 2020.  After reviewing back of her imaging of the neck region, there was not enough detail to be ultimately rely on the study for the neck.  We discussed doing CT neck versus esophagram versus swallow study or referral to speech pathology.  We will contact her and give her some options   Dorothea Ogle, Hershal Coria  [place] [phone] (phone) [phone] (fax)  [address]

## 2020-07-22 NOTE — Telephone Encounter (Signed)
Please call patient.  I spoke to the radiologist.  As I suspected the CT chest does not give enough detail the neck to rely on.  We have a couple different ways we could move forward.  Ultimately she may end up needing a CT of the neck or esophagram or consult.  At this point I would recommend either starting with a swallow study or referral to ear nose and throat and letting them decide the next steps.  See what she wants to do

## 2020-07-23 ENCOUNTER — Other Ambulatory Visit: Payer: Self-pay | Admitting: Medical

## 2020-07-23 DIAGNOSIS — R591 Generalized enlarged lymph nodes: Secondary | ICD-10-CM

## 2020-07-23 DIAGNOSIS — R1310 Dysphagia, unspecified: Secondary | ICD-10-CM

## 2020-07-23 DIAGNOSIS — R221 Localized swelling, mass and lump, neck: Secondary | ICD-10-CM

## 2020-07-23 NOTE — Telephone Encounter (Signed)
Patient stated she wants to start with the ct of the neck.

## 2020-07-23 NOTE — Telephone Encounter (Signed)
I placed the order for neck CT

## 2020-07-24 NOTE — Telephone Encounter (Signed)
Patient requested imaging be at [place] has been faxed.

## 2020-07-26 ENCOUNTER — Other Ambulatory Visit: Payer: Self-pay | Admitting: Medical

## 2020-07-26 DIAGNOSIS — E118 Type 2 diabetes mellitus with unspecified complications: Secondary | ICD-10-CM

## 2020-07-29 DIAGNOSIS — Z0000 Encounter for general adult medical examination without abnormal findings: Secondary | ICD-10-CM | POA: Diagnosis not present

## 2020-07-29 DIAGNOSIS — R591 Generalized enlarged lymph nodes: Secondary | ICD-10-CM | POA: Diagnosis not present

## 2020-07-29 DIAGNOSIS — R0989 Other specified symptoms and signs involving the circulatory and respiratory systems: Secondary | ICD-10-CM | POA: Diagnosis not present

## 2020-07-29 DIAGNOSIS — R221 Localized swelling, mass and lump, neck: Secondary | ICD-10-CM | POA: Diagnosis not present

## 2020-07-29 DIAGNOSIS — R1310 Dysphagia, unspecified: Secondary | ICD-10-CM | POA: Diagnosis not present

## 2020-07-30 ENCOUNTER — Encounter: Payer: Self-pay | Admitting: Medical

## 2020-08-02 ENCOUNTER — Telehealth: Payer: Self-pay | Admitting: Medical

## 2020-08-02 NOTE — Telephone Encounter (Signed)
fortunately no neck mass or tumor seen.  If still having same symptom, I recommend referral to speech therapy for swallow concerns.   If new or different symptom let me know.    Another option is a swallow study as well, but she may just benefit from speech therapy who deals with swallow issues

## 2020-08-05 ENCOUNTER — Other Ambulatory Visit: Payer: Self-pay

## 2020-08-05 DIAGNOSIS — R1310 Dysphagia, unspecified: Secondary | ICD-10-CM

## 2020-08-05 NOTE — Telephone Encounter (Signed)
Referral has been sent. Patient notified via mychart.

## 2020-08-05 NOTE — Telephone Encounter (Signed)
That is fine.  Refer to ENT, Dr. Lucia Gaskins preferably as first choice

## 2020-08-05 NOTE — Telephone Encounter (Signed)
Spoke to patient on phone about referral and provided contact information.

## 2020-08-05 NOTE — Telephone Encounter (Signed)
Patient is having same symptoms but wants to know if she cane be referred to ENT. Please advise.

## 2020-08-12 ENCOUNTER — Telehealth: Payer: Self-pay

## 2020-08-12 ENCOUNTER — Other Ambulatory Visit: Payer: Self-pay | Admitting: Medical

## 2020-08-12 MED ORDER — DEXILANT 60 MG PO CPDR: 1.0000 | DELAYED_RELEASE_CAPSULE | Freq: Two times a day (BID) | ORAL | 0 refills | Status: DC

## 2020-08-12 NOTE — Telephone Encounter (Signed)
I sent the refill for 180 supply  A few reminders: 1-if possible it would be best if she only took Dexilant once a day due to the risk of worsening bone reabsorption osteoporosis using this higher dose as well as the gastric consequences and lack of nutrients absorption 2-start baby aspirin daily.  New research shows that aspirin should not be taken for primary prevention of heart disease 3-if she is taking Celebrex every single day, we try to limit this due to protect the kidneys unless she absolutely needs this.  I am sure she is aware of this, but that is the recommendation

## 2020-08-12 NOTE — Telephone Encounter (Signed)
Recv'd fax from [place] stating please send in 90 day Rx for Dexilant #180 to local pharmacy as it saves pt on her co pay

## 2020-08-13 NOTE — Telephone Encounter (Signed)
Lmom informing patient that she has been referred to Dr. Redmond Pulling for ENT per her preference. Patient also informed that message has been sent via mychart and to let us know should she have any additional questions.

## 2020-08-20 DIAGNOSIS — L986 Other infiltrative disorders of the skin and subcutaneous tissue: Secondary | ICD-10-CM | POA: Diagnosis not present

## 2020-08-20 DIAGNOSIS — D485 Neoplasm of uncertain behavior of skin: Secondary | ICD-10-CM | POA: Diagnosis not present

## 2020-08-20 DIAGNOSIS — L309 Dermatitis, unspecified: Secondary | ICD-10-CM | POA: Diagnosis not present

## 2020-08-28 ENCOUNTER — Ambulatory Visit: Payer: Medicare Other | Admitting: Podiatry

## 2020-08-29 DIAGNOSIS — B86 Scabies: Secondary | ICD-10-CM | POA: Diagnosis not present

## 2020-09-01 ENCOUNTER — Other Ambulatory Visit: Payer: Self-pay | Admitting: Medical

## 2020-09-03 ENCOUNTER — Encounter: Payer: Self-pay | Admitting: Medical

## 2020-09-16 DIAGNOSIS — B86 Scabies: Secondary | ICD-10-CM | POA: Diagnosis not present

## 2020-09-23 ENCOUNTER — Other Ambulatory Visit: Payer: Self-pay

## 2020-09-23 ENCOUNTER — Other Ambulatory Visit: Payer: Self-pay | Admitting: Family Medicine

## 2020-09-23 ENCOUNTER — Telehealth: Payer: Self-pay

## 2020-09-23 MED ORDER — DEXILANT 60 MG PO CPDR: 1.0000 | DELAYED_RELEASE_CAPSULE | Freq: Two times a day (BID) | ORAL | 0 refills | Status: DC

## 2020-09-23 NOTE — Telephone Encounter (Signed)
Received a fax from [facility] for a refill on the pts. dexilant pt. Last apt was 06/05/20 and next apt. Is 10/09/20.

## 2020-09-23 NOTE — Telephone Encounter (Signed)
Medication has been sent for patient. 

## 2020-09-25 ENCOUNTER — Ambulatory Visit (HOSPITAL_COMMUNITY)
Admission: RE | Admit: 2020-09-25 | Discharge: 2020-09-25 | Disposition: A | Discharge status: Home / Self Care | Payer: Medicare Other | Source: Ambulatory Visit | Attending: Cardiovascular Disease | Admitting: Cardiovascular Disease

## 2020-09-25 ENCOUNTER — Other Ambulatory Visit: Payer: Self-pay

## 2020-09-25 ENCOUNTER — Other Ambulatory Visit (HOSPITAL_COMMUNITY): Payer: Self-pay | Admitting: Cardiology

## 2020-09-25 DIAGNOSIS — I6523 Occlusion and stenosis of bilateral carotid arteries: Secondary | ICD-10-CM | POA: Diagnosis not present

## 2020-09-25 DIAGNOSIS — I6521 Occlusion and stenosis of right carotid artery: Secondary | ICD-10-CM

## 2020-09-25 DIAGNOSIS — L280 Lichen simplex chronicus: Secondary | ICD-10-CM | POA: Diagnosis not present

## 2020-09-25 DIAGNOSIS — L570 Actinic keratosis: Secondary | ICD-10-CM | POA: Diagnosis not present

## 2020-09-25 DIAGNOSIS — L814 Other melanin hyperpigmentation: Secondary | ICD-10-CM | POA: Diagnosis not present

## 2020-09-25 DIAGNOSIS — B86 Scabies: Secondary | ICD-10-CM | POA: Diagnosis not present

## 2020-09-27 ENCOUNTER — Other Ambulatory Visit: Payer: Self-pay | Admitting: Medical

## 2020-09-27 DIAGNOSIS — E118 Type 2 diabetes mellitus with unspecified complications: Secondary | ICD-10-CM

## 2020-10-02 DIAGNOSIS — M5416 Radiculopathy, lumbar region: Secondary | ICD-10-CM | POA: Diagnosis not present

## 2020-10-02 DIAGNOSIS — M7918 Myalgia, other site: Secondary | ICD-10-CM | POA: Diagnosis not present

## 2020-10-02 DIAGNOSIS — I10 Essential (primary) hypertension: Secondary | ICD-10-CM | POA: Diagnosis not present

## 2020-10-02 DIAGNOSIS — M961 Postlaminectomy syndrome, not elsewhere classified: Secondary | ICD-10-CM | POA: Diagnosis not present

## 2020-10-02 DIAGNOSIS — Z6836 Body mass index (BMI) 36.0-36.9, adult: Secondary | ICD-10-CM | POA: Diagnosis not present

## 2020-10-07 ENCOUNTER — Telehealth: Payer: Self-pay

## 2020-10-07 DIAGNOSIS — I6523 Occlusion and stenosis of bilateral carotid arteries: Secondary | ICD-10-CM

## 2020-10-07 NOTE — Telephone Encounter (Signed)
Left message for patient about carotid results. Will repeat testing in one year, order already placed.

## 2020-10-09 ENCOUNTER — Encounter: Payer: Self-pay | Admitting: Medical

## 2020-10-09 ENCOUNTER — Ambulatory Visit (INDEPENDENT_AMBULATORY_CARE_PROVIDER_SITE_OTHER): Payer: Medicare Other | Admitting: Medical

## 2020-10-09 ENCOUNTER — Other Ambulatory Visit: Payer: Self-pay

## 2020-10-09 VITALS — BP 130/70 | HR 76 | Ht 65.0 in | Wt 202.2 lb

## 2020-10-09 DIAGNOSIS — I6523 Occlusion and stenosis of bilateral carotid arteries: Secondary | ICD-10-CM

## 2020-10-09 DIAGNOSIS — I10 Essential (primary) hypertension: Secondary | ICD-10-CM

## 2020-10-09 DIAGNOSIS — I700 Atherosclerosis of aorta: Secondary | ICD-10-CM | POA: Diagnosis not present

## 2020-10-09 DIAGNOSIS — E118 Type 2 diabetes mellitus with unspecified complications: Secondary | ICD-10-CM | POA: Diagnosis not present

## 2020-10-09 DIAGNOSIS — D72829 Elevated white blood cell count, unspecified: Secondary | ICD-10-CM

## 2020-10-09 DIAGNOSIS — G4761 Periodic limb movement disorder: Secondary | ICD-10-CM

## 2020-10-09 DIAGNOSIS — E785 Hyperlipidemia, unspecified: Secondary | ICD-10-CM

## 2020-10-09 DIAGNOSIS — R5383 Other fatigue: Secondary | ICD-10-CM

## 2020-10-09 DIAGNOSIS — E559 Vitamin D deficiency, unspecified: Secondary | ICD-10-CM

## 2020-10-09 DIAGNOSIS — E1142 Type 2 diabetes mellitus with diabetic polyneuropathy: Secondary | ICD-10-CM | POA: Diagnosis not present

## 2020-10-09 DIAGNOSIS — Z8616 Personal history of COVID-19: Secondary | ICD-10-CM | POA: Diagnosis not present

## 2020-10-09 DIAGNOSIS — D638 Anemia in other chronic diseases classified elsewhere: Secondary | ICD-10-CM | POA: Diagnosis not present

## 2020-10-09 NOTE — Progress Notes (Signed)
Subjective:  Robyn Hill is a 74 y.o. female who presents for Chief Complaint  Patient presents with  . Diabetes     Here for med check  diabetes - sugars running high of late, 150-170s fasting.  However recently when she had went to a party and had eaten sweets, her sugar was lower than usual the next morning.  Compliant with metformin 540m BID, bydureon weekly, Toujeo 23 units.   Doesn't eat after 6pm  Tired all the time.  Friend and her mother in [place] just died of covid, +.   No chest pain, no worse lower ext edema.  Wants to see if she still has antibiotics.  Still on the fence about getting covid vaccine.   Hx//o covid infection months ago.    HTN - compliant with medication  hyperlipemia - compliant with medication  Vit d deficiency - compliant with medications  No other aggravating or relieving factors.    No other c/o.  Past Medical History:  Diagnosis Date  . Adrenal mass, right ([facility]   . Allergy   . Anemia   . Asthma    "as a teenager"  . Blind right eye    since childhood  . Blood transfusion 1970  . Chronic back pain   . Chronic diarrhea    Colestipol therapy  . Depression   . Diverticulitis   . Diverticulosis   . Fatty liver   . Female bladder prolapse   . GERD (gastroesophageal reflux disease)   . Glaucoma   . H/O bone density study 11/29/2014   normal study although mild decreased in density from prior study  . H/O hiatal hernia   . Hemorrhoids   . History of kidney stones   . History of uterine cancer    s/p hysterectomy  . Hyperlipidemia   . Hypertension   . Migraine    hx of migraines in past   . Neuromuscular disorder ([facility]    diabetic neuropahthy  . Numbness and tingling of both legs   . Osteoarthritis   . Rheumatic fever    age 47  . Type II diabetes mellitus ([facility]   . Urinary incontinence   . Vitamin D deficiency    Current Outpatient Medications on File Prior to Visit  Medication Sig Dispense Refill  . Accu-Chek FastClix  Lancets MISC Check sugar twice daily. 102 each 6  . atorvastatin (LIPITOR) 40 MG tablet Take 1 tablet (40 mg total) by mouth at bedtime. 90 tablet 3  . B-D UF III MINI PEN NEEDLES 31G X 5 MM MISC USE TO INJECT TOUJEO AS DIRECTED 100 each 0  . baclofen (LIORESAL) 10 MG tablet Take 1 tablet (10 mg total) by mouth at bedtime as needed for muscle spasms. 90 each 0  . Blood Glucose Calibration (ACCU-CHEK INSTANT CONTROL) LIQD Use for controls for accu-chek activa meter 1 each 0  . Blood Glucose Monitoring Suppl (ACCU-CHEK GUIDE ME) w/Device KIT 1 Device by Does not apply route 2 (two) times daily at 8 am and 10 pm. 1 kit 0  . brimonidine (ALPHAGAN) 0.2 % ophthalmic solution Place 1 drop into the left eye 2 times daily.    .Marland KitchenBYDUREON BCISE 2 MG/0.85ML AUIJ INJECT CONTENTS OF 1 PEN (2MG) INTO SKIN ONCE A WEEK 12 mL 0  . celecoxib (CELEBREX) 200 MG capsule Take 200 mg by mouth 2 (two) times daily.    . Cholecalciferol (VITAMIN D-3) 25 MCG (1000 UT) CAPS Take 1 capsule (1,000 Units  total) by mouth daily. 90 capsule 3  . dexlansoprazole (DEXILANT) 60 MG capsule Take 1 capsule (60 mg total) by mouth 2 (two) times daily. 180 capsule 0  . dorzolamide-timolol (COSOPT) 22.3-6.8 MG/ML ophthalmic solution Place 1 drop into both eyes 2 (two) times daily.    . DULoxetine (CYMBALTA) 60 MG capsule Take 1 capsule (60 mg total) by mouth daily. 90 capsule 3  . enalapril (VASOTEC) 20 MG tablet Take 1 tablet (20 mg total) by mouth daily. 90 tablet 3  . ferrous gluconate (FERGON) 324 MG tablet Take 1 tablet (324 mg total) by mouth daily with breakfast. 90 tablet 1  . glucose blood (ACCU-CHEK GUIDE) test strip USE 1 CLEAN STRIP TO CHECK BLOOD GLUCOSE TWICE DAILY 150 each 0  . hydrochlorothiazide (HYDRODIURIL) 25 MG tablet Take 1 tablet (25 mg total) by mouth daily. 90 tablet 3  . hydrocortisone (PROCTOSOL HC) 2.5 % rectal cream Place 1 application rectally 2 (two) times daily. 30 g 2  . HYDROmorphone (DILAUDID) 4 MG tablet  Take 4 mg by mouth every 6 (six) hours as needed.    . latanoprost (XALATAN) 0.005 % ophthalmic solution Place 1 drop into the right eye at bedtime.    . metFORMIN (GLUCOPHAGE) 500 MG tablet Take 1 tablet (500 mg total) by mouth 2 (two) times daily with a meal. 180 tablet 3  . TOUJEO SOLOSTAR 300 UNIT/ML Solostar Pen INJECT 22 UNITS SUBCUTANEOUSLY ONCE DAILY 6 mL 0  . levETIRAcetam (KEPPRA) 250 MG tablet Take 250 mg by mouth 2 (two) times daily.     No current facility-administered medications on file prior to visit.     The following portions of the patient's history were reviewed and updated as appropriate: allergies, current medications, past family history, past medical history, past social history, past surgical history and problem list.  ROS Otherwise as in subjective above  Objective: BP 130/70   Pulse 76   Ht '5\' 5"'  (1.651 m)   Wt 202 lb 3.2 oz (91.7 kg)   SpO2 96%   BMI 33.65 kg/m   General appearance: alert, no distress, well developed, well nourished    CT abdomen/pelvis 07/16/20  IMPRESSION: 1. Coronary artery calcifications are noted suggesting coronary artery disease. 2. Small sliding-type hiatal hernia. 3. Stable right adrenal adenoma. 4. Stable cystocele is noted. 5. No other significant abnormality seen in the chest, abdomen or pelvis. 6. Aortic atherosclerosis.  CT soft tissue neck 07/2020 with no obvious mass.  There was arthritis noted    Assessment: Encounter Diagnoses  Name Primary?  . Essential hypertension, benign Yes  . Diabetes mellitus with complication ([place])   . Diabetic polyneuropathy associated with type 2 diabetes mellitus ([place])   . Leukocytosis, unspecified type   . Periodic limb movement disorder   . Vitamin D deficiency   . Anemia of chronic disease   . History of COVID-19   . Hyperlipidemia, unspecified hyperlipidemia type   . Serum calcium elevated   . Aortic atherosclerosis ([place])   . Fatigue, unspecified type       Plan: Diabetes - routine labs today, will likely need to increase Toujeo, c/t metformin and bydureon.  Elevated ALP , elevated LFTs -  Reviewed CT chest abdomen pelvis from 06/2020.   additional labs today for evaluation.    elevated calcium - additional labs today  HTN - c/t current medication  Hx/o leucocytosis - recheck CBC today  Periodica limb movement disorder - was on Requip in the past.  She is not sure if she is taking this now, and I will reviewed back over her chart regarding medications.  She was put on Keppra by either neurosurgery or Dr. Jannifer Franklin neurology for neuropathy prior to 11/2018.  This was started due to prior intolerances and med allergies to other medications.  Chart record suggests she has been on Keppra at least since early 2020.  We were discussing requp.  From what I can tell in the chart, we didn't get refill request automatically in 12/2018 when the script would have expired.   She may have quit this on her on or it may have inadvertently got dropped out of the chart record in triage prior to 11/2018.    Vit D deficiency -  C/t vit D supplement  Hx/o covid - wants antibody test today before she decided on getting vaccine.  She and husband had covid months ago.   Fatigue - unclear etiology, multifactorial most likely, labs today  Hyperlipidemia , aortic atherosclerosis - Lipids reviewed from 05/2020, c/t statin   Britten was seen today for diabetes.  Diagnoses and all orders for this visit:  Essential hypertension, benign  Diabetes mellitus with complication ([place]) -     Hemoglobin A1c  Diabetic polyneuropathy associated with type 2 diabetes mellitus (H[facility]) -     Hemoglobin A1c  Leukocytosis, unspecified type -     CBC with Differential/Platelet  Periodic limb movement disorder  Vitamin D deficiency  Anemia of chronic disease -     CBC with Differential/Platelet  History of COVID-19 -     SAR CoV2 Serology (COVID 19)AB(IGG)IA  Hyperlipidemia,  unspecified hyperlipidemia type -     PTH, Intact and Calcium  Serum calcium elevated -     Alkaline Phosphatase, Isoenzymes -     PTH, Intact and Calcium -     TSH  Aortic atherosclerosis (HCC)  Fatigue, unspecified type  Spent 35 minutes face to face with patient in discussion of symptoms, evaluation, plan and recommendations.    Follow up: pending labs

## 2020-10-10 ENCOUNTER — Other Ambulatory Visit: Payer: Self-pay | Admitting: Medical

## 2020-10-10 DIAGNOSIS — E118 Type 2 diabetes mellitus with unspecified complications: Secondary | ICD-10-CM

## 2020-10-11 LAB — CBC WITH DIFFERENTIAL/PLATELET
Basophils Absolute: 0.1 10*3/uL (ref 0.0–0.2)
Basos: 1 %
EOS (ABSOLUTE): 0.6 10*3/uL — ABNORMAL HIGH (ref 0.0–0.4)
Eos: 5 %
Hematocrit: 40.1 % (ref 34.0–46.6)
Hemoglobin: 13.6 g/dL (ref 11.1–15.9)
Immature Grans (Abs): 0.1 10*3/uL (ref 0.0–0.1)
Immature Granulocytes: 1 %
Lymphocytes Absolute: 3.7 10*3/uL — ABNORMAL HIGH (ref 0.7–3.1)
Lymphs: 32 %
MCH: 28.3 pg (ref 26.6–33.0)
MCHC: 33.9 g/dL (ref 31.5–35.7)
MCV: 84 fL (ref 79–97)
Monocytes Absolute: 0.9 10*3/uL (ref 0.1–0.9)
Monocytes: 8 %
Neutrophils Absolute: 6.4 10*3/uL (ref 1.4–7.0)
Neutrophils: 53 %
Platelets: 354 10*3/uL (ref 150–450)
RBC: 4.8 x10E6/uL (ref 3.77–5.28)
RDW: 11.9 % (ref 11.7–15.4)
WBC: 11.7 10*3/uL — ABNORMAL HIGH (ref 3.4–10.8)

## 2020-10-11 LAB — ALKALINE PHOSPHATASE, ISOENZYMES
Alkaline Phosphatase: 109 IU/L (ref 44–121)
BONE FRACTION: 31 % (ref 14–68)
INTESTINAL FRAC.: 4 % (ref 0–18)
LIVER FRACTION: 65 % (ref 18–85)

## 2020-10-11 LAB — PTH, INTACT AND CALCIUM
Calcium: 10.2 mg/dL (ref 8.7–10.3)
PTH: 58 pg/mL (ref 15–65)

## 2020-10-11 LAB — SAR COV2 SEROLOGY (COVID19)AB(IGG),IA
SARS-CoV-2 Semi-Quant IgG Ab: 126 AU/mL (ref <13.0)
SARS-CoV-2 Spike Ab Interp: POSITIVE

## 2020-10-11 LAB — HEMOGLOBIN A1C
Est. average glucose Bld gHb Est-mCnc: 192 mg/dL
Hgb A1c MFr Bld: 8.3 % — ABNORMAL HIGH (ref 4.8–5.6)

## 2020-10-11 LAB — TSH: TSH: 2.01 u[IU]/mL (ref 0.450–4.500)

## 2020-11-30 ENCOUNTER — Other Ambulatory Visit: Payer: Self-pay | Admitting: Medical

## 2020-11-30 DIAGNOSIS — E118 Type 2 diabetes mellitus with unspecified complications: Secondary | ICD-10-CM

## 2020-12-02 ENCOUNTER — Ambulatory Visit: Payer: Medicare Other | Admitting: Podiatry

## 2020-12-23 ENCOUNTER — Other Ambulatory Visit: Payer: Self-pay | Admitting: Medical

## 2021-01-02 DIAGNOSIS — M47812 Spondylosis without myelopathy or radiculopathy, cervical region: Secondary | ICD-10-CM | POA: Diagnosis not present

## 2021-01-02 DIAGNOSIS — G629 Polyneuropathy, unspecified: Secondary | ICD-10-CM | POA: Diagnosis not present

## 2021-01-02 DIAGNOSIS — M7918 Myalgia, other site: Secondary | ICD-10-CM | POA: Diagnosis not present

## 2021-01-02 DIAGNOSIS — M961 Postlaminectomy syndrome, not elsewhere classified: Secondary | ICD-10-CM | POA: Diagnosis not present

## 2021-01-02 DIAGNOSIS — M5416 Radiculopathy, lumbar region: Secondary | ICD-10-CM | POA: Diagnosis not present

## 2021-01-08 ENCOUNTER — Other Ambulatory Visit: Payer: Self-pay

## 2021-01-08 ENCOUNTER — Ambulatory Visit (INDEPENDENT_AMBULATORY_CARE_PROVIDER_SITE_OTHER): Payer: Medicare Other | Admitting: Medical

## 2021-01-08 ENCOUNTER — Encounter: Payer: Self-pay | Admitting: Medical

## 2021-01-08 VITALS — BP 134/72 | HR 79 | Ht 65.0 in | Wt 201.6 lb

## 2021-01-08 DIAGNOSIS — R8290 Unspecified abnormal findings in urine: Secondary | ICD-10-CM

## 2021-01-08 DIAGNOSIS — E118 Type 2 diabetes mellitus with unspecified complications: Secondary | ICD-10-CM | POA: Diagnosis not present

## 2021-01-08 DIAGNOSIS — R053 Chronic cough: Secondary | ICD-10-CM | POA: Insufficient documentation

## 2021-01-08 DIAGNOSIS — E1142 Type 2 diabetes mellitus with diabetic polyneuropathy: Secondary | ICD-10-CM

## 2021-01-08 DIAGNOSIS — I10 Essential (primary) hypertension: Secondary | ICD-10-CM

## 2021-01-08 DIAGNOSIS — R519 Headache, unspecified: Secondary | ICD-10-CM

## 2021-01-08 DIAGNOSIS — R1012 Left upper quadrant pain: Secondary | ICD-10-CM

## 2021-01-08 DIAGNOSIS — D72829 Elevated white blood cell count, unspecified: Secondary | ICD-10-CM | POA: Diagnosis not present

## 2021-01-08 DIAGNOSIS — D638 Anemia in other chronic diseases classified elsewhere: Secondary | ICD-10-CM | POA: Diagnosis not present

## 2021-01-08 DIAGNOSIS — I700 Atherosclerosis of aorta: Secondary | ICD-10-CM

## 2021-01-08 DIAGNOSIS — R0781 Pleurodynia: Secondary | ICD-10-CM | POA: Diagnosis not present

## 2021-01-08 DIAGNOSIS — R0989 Other specified symptoms and signs involving the circulatory and respiratory systems: Secondary | ICD-10-CM | POA: Diagnosis not present

## 2021-01-08 DIAGNOSIS — E785 Hyperlipidemia, unspecified: Secondary | ICD-10-CM | POA: Diagnosis not present

## 2021-01-08 DIAGNOSIS — R0789 Other chest pain: Secondary | ICD-10-CM

## 2021-01-08 LAB — POCT URINALYSIS DIP (PROADVANTAGE DEVICE)
Bilirubin, UA: NEGATIVE
Blood, UA: NEGATIVE
Glucose, UA: NEGATIVE mg/dL
Ketones, POC UA: NEGATIVE mg/dL
Nitrite, UA: NEGATIVE
Specific Gravity, Urine: 1.015
Urobilinogen, Ur: 0.2
pH, UA: 6.5 (ref 5.0–8.0)

## 2021-01-08 NOTE — Progress Notes (Addendum)
Subjective:  Robyn Hill is a 75 y.o. female who presents for Chief Complaint  Patient presents with  . Diabetes    Pt present today for follow up on diabetes     Here for med check  Lately been having some pain in her left side.  Gets pains in left upper abdomen/rib area.  No recent injury or trauma.  No pain with eating or after eating.  No nausea or vomiting.  From time to time gives odor in the urine but no blood in the urinary burning with urination.  Has chronic back pain but no new change in back pain.  Having some pains in her right temple region or just over the ear.  The pain does not radiate anywhere else in the face.  The pain can be out of the blue.  It is a sharp pain.  No swollen arteries or pulsating artery in the same area.  She was wanting to anything to do with her.  She does have history of TMJ.  Diabetes-compliant with toujeo 26 units daily, byduroen pen, metformin BID.  No recent hypoglycemia.  Hyperlipidemia-compliant with statin without complaint  Vit d deficiency - compliant with medications  No other aggravating or relieving factors.    No other c/o.  Past Medical History:  Diagnosis Date  . Adrenal mass, right ([place])   . Allergy   . Anemia   . Asthma    "as a teenager"  . Blind right eye    since childhood  . Blood transfusion 1970  . Chronic back pain   . Chronic diarrhea    Colestipol therapy  . Depression   . Diverticulitis   . Diverticulosis   . Fatty liver   . Female bladder prolapse   . GERD (gastroesophageal reflux disease)   . Glaucoma   . H/O bone density study 11/29/2014   normal study although mild decreased in density from prior study  . H/O hiatal hernia   . Hemorrhoids   . History of kidney stones   . History of uterine cancer    s/p hysterectomy  . Hyperlipidemia   . Hypertension   . Migraine    hx of migraines in past   . Neuromuscular disorder ([place])    diabetic neuropahthy  . Numbness and tingling of both legs   .  Osteoarthritis   . Rheumatic fever    age 29  . Type II diabetes mellitus ([place])   . Urinary incontinence   . Vitamin D deficiency    Current Outpatient Medications on File Prior to Visit  Medication Sig Dispense Refill  . Accu-Chek FastClix Lancets MISC USE 1  LANCET TO CHECK GLUCOSE TWICE DAILY 102 each 0  . atorvastatin (LIPITOR) 40 MG tablet Take 1 tablet (40 mg total) by mouth at bedtime. 90 tablet 3  . B-D UF III MINI PEN NEEDLES 31G X 5 MM MISC USE TO INJECT TOUJEO AS DIRECTED 100 each 0  . baclofen (LIORESAL) 10 MG tablet Take 1 tablet (10 mg total) by mouth at bedtime as needed for muscle spasms. 90 each 0  . Blood Glucose Calibration (ACCU-CHEK INSTANT CONTROL) LIQD Use for controls for accu-chek activa meter 1 each 0  . Blood Glucose Monitoring Suppl (ACCU-CHEK GUIDE ME) w/Device KIT 1 Device by Does not apply route 2 (two) times daily at 8 am and 10 pm. 1 kit 0  . brimonidine (ALPHAGAN) 0.2 % ophthalmic solution Place 1 drop into the left eye 2 times  daily.    . Cholecalciferol (VITAMIN D-3) 25 MCG (1000 UT) CAPS Take 1 capsule (1,000 Units total) by mouth daily. 90 capsule 3  . dorzolamide-timolol (COSOPT) 22.3-6.8 MG/ML ophthalmic solution Place 1 drop into both eyes 2 (two) times daily.    . DULoxetine (CYMBALTA) 60 MG capsule Take 1 capsule (60 mg total) by mouth daily. 90 capsule 3  . enalapril (VASOTEC) 20 MG tablet Take 1 tablet (20 mg total) by mouth daily. 90 tablet 3  . glucose blood (ACCU-CHEK GUIDE) test strip USE 1 CLEAN STRIP TO CHECK BLOOD GLUCOSE TWICE DAILY 150 each 0  . hydrochlorothiazide (HYDRODIURIL) 25 MG tablet Take 1 tablet (25 mg total) by mouth daily. 90 tablet 3  . hydrocortisone (PROCTOSOL HC) 2.5 % rectal cream Place 1 application rectally 2 (two) times daily. 30 g 2  . HYDROmorphone (DILAUDID) 4 MG tablet Take 4 mg by mouth every 6 (six) hours as needed.    . latanoprost (XALATAN) 0.005 % ophthalmic solution Place 1 drop into the right eye at bedtime.     . levETIRAcetam (KEPPRA) 250 MG tablet Take 250 mg by mouth 2 (two) times daily.    . metFORMIN (GLUCOPHAGE) 500 MG tablet Take 1 tablet (500 mg total) by mouth 2 (two) times daily with a meal. 180 tablet 3  . nortriptyline (PAMELOR) 10 MG capsule TAKE 1 TO 2 CAPSULE BY ORAL ROUTE EVERY DAY AT BEDTIME (90 day supply )    . triamcinolone (KENALOG) 0.1 % Apply topically.    . celecoxib (CELEBREX) 200 MG capsule Take 200 mg by mouth 2 (two) times daily. (Patient not taking: Reported on 01/08/2021)     No current facility-administered medications on file prior to visit.     The following portions of the patient's history were reviewed and updated as appropriate: allergies, current medications, past family history, past medical history, past social history, past surgical history and problem list.  ROS Otherwise as in subjective above  Objective: BP 134/72   Pulse 79   Ht '5\' 5"'  (1.651 m)   Wt 201 lb 9.6 oz (91.4 kg)   SpO2 92%   BMI 33.55 kg/m   General appearance: alert, no distress, well developed, well nourished Tender however left lower ribs and left upper abdomen, no mass, is somewhat guarded, no distention or rigid abdomen Back nontender Lungs clear Heart regular rate and rhythm, normal S1-S2, no murmurs No lower extremity edema Nontender over right temporal or parietal region, nontender over the TMJ but there is a click felt with the motion of the jaw in the right TMJ area Otherwise normocephalic HENT unremarkable  Diabetic Foot Exam - Simple   Simple Foot Form Diabetic Foot exam was performed with the following findings: Yes 01/08/2021  1:17 PM  Visual Inspection See comments: Yes Sensation Testing See comments: Yes Pulse Check See comments: Yes Comments 1+ pedal pulses, unable to feel monofilament in the toes but can appreciate monofilament test of the volar feet, flat-footed, relatively normal range of motion of ankle bilaterally       CT abdomen/pelvis  07/16/20  IMPRESSION: 1. Coronary artery calcifications are noted suggesting coronary artery disease. 2. Small sliding-type hiatal hernia. 3. Stable right adrenal adenoma. 4. Stable cystocele is noted. 5. No other significant abnormality seen in the chest, abdomen or pelvis. 6. Aortic atherosclerosis.    Assessment: Encounter Diagnoses  Name Primary?  . Left upper quadrant abdominal pain Yes  . Diabetic polyneuropathy associated with type 2 diabetes mellitus ([place])   .  Diabetes mellitus with complication ([place])   . Decreased pedal pulses   . Hyperlipidemia, unspecified hyperlipidemia type   . Essential hypertension, benign   . Facial pain   . Anemia of chronic disease   . Rib pain on left side   . Chronic cough   . Abnormal urine odor   . Leukocytosis, unspecified type   . Aortic atherosclerosis ([place])      Plan: Left rib pain, upper abdominal pain, chronic cough  I recommend x-rays of ribs and abdomen upright to help further evaluate this symptom  We are checking labs today in general which can help further evaluate this concern  Please go to [place] for your xrays.   Their hours are 8am - 4:30 pm Monday - Friday.  Take your insurance card with you.  [facility] [phone]  [address], [place], [place] [address]  [address], [place] [address]   Facial pain-this is possibly related to either TMJ or something for trigeminal neuralgia.  Pending labs we can give other recommendations  Diabetes - routine labs today, c/t gluocometer testing  HTN - c/t current medication  Hx/o leucocytosis - recheck CBC today  Vit D deficiency -  C/t vit D supplement  Hyperlipidemia , aortic atherosclerosis - c/t statin  Urine odor-we will send urine for culture   Berry was seen today for diabetes.  Diagnoses and all orders for this visit:  Left upper quadrant abdominal pain -     POCT Urinalysis DIP (Proadvantage Device) -      Lipase -     DG Abd 1 View; Future -     DG Chest 2 View; Future  Diabetic polyneuropathy associated with type 2 diabetes mellitus (HCC)  Diabetes mellitus with complication (HCC) -     Comprehensive metabolic panel -     CBC with Differential/Platelet -     Hemoglobin A1c -     POCT Urinalysis DIP (Proadvantage Device)  Decreased pedal pulses  Hyperlipidemia, unspecified hyperlipidemia type -     Comprehensive metabolic panel  Essential hypertension, benign -     POCT Urinalysis DIP (Proadvantage Device)  Facial pain  Anemia of chronic disease -     Iron  Rib pain on left side -     DG Abd 1 View; Future -     DG Chest 2 View; Future  Chronic cough -     DG Abd 1 View; Future -     DG Chest 2 View; Future  Abnormal urine odor -     Urine Culture -     Urine Culture  Leukocytosis, unspecified type  Aortic atherosclerosis ([place])  .    Follow up: pending labs

## 2021-01-09 ENCOUNTER — Other Ambulatory Visit: Payer: Self-pay | Admitting: Medical

## 2021-01-09 DIAGNOSIS — E118 Type 2 diabetes mellitus with unspecified complications: Secondary | ICD-10-CM

## 2021-01-09 DIAGNOSIS — D72829 Elevated white blood cell count, unspecified: Secondary | ICD-10-CM

## 2021-01-09 LAB — CBC WITH DIFFERENTIAL/PLATELET
Basophils Absolute: 0.1 10*3/uL (ref 0.0–0.2)
Basos: 1 %
EOS (ABSOLUTE): 0.4 10*3/uL (ref 0.0–0.4)
Eos: 3 %
Hematocrit: 41.2 % (ref 34.0–46.6)
Hemoglobin: 13.4 g/dL (ref 11.1–15.9)
Immature Grans (Abs): 0 10*3/uL (ref 0.0–0.1)
Immature Granulocytes: 0 %
Lymphocytes Absolute: 3 10*3/uL (ref 0.7–3.1)
Lymphs: 21 %
MCH: 27.6 pg (ref 26.6–33.0)
MCHC: 32.5 g/dL (ref 31.5–35.7)
MCV: 85 fL (ref 79–97)
Monocytes Absolute: 0.9 10*3/uL (ref 0.1–0.9)
Monocytes: 6 %
Neutrophils Absolute: 9.7 10*3/uL — ABNORMAL HIGH (ref 1.4–7.0)
Neutrophils: 69 %
Platelets: 391 10*3/uL (ref 150–450)
RBC: 4.85 x10E6/uL (ref 3.77–5.28)
RDW: 11.3 % — ABNORMAL LOW (ref 11.7–15.4)
WBC: 14 10*3/uL — ABNORMAL HIGH (ref 3.4–10.8)

## 2021-01-09 LAB — COMPREHENSIVE METABOLIC PANEL
ALT: 51 IU/L — ABNORMAL HIGH (ref 0–32)
AST: 25 IU/L (ref 0–40)
Albumin/Globulin Ratio: 1.6 (ref 1.2–2.2)
Albumin: 4.5 g/dL (ref 3.7–4.7)
Alkaline Phosphatase: 148 IU/L — ABNORMAL HIGH (ref 44–121)
BUN/Creatinine Ratio: 22 (ref 12–28)
BUN: 17 mg/dL (ref 8–27)
Bilirubin Total: 0.4 mg/dL (ref 0.0–1.2)
CO2: 25 mmol/L (ref 20–29)
Calcium: 10.2 mg/dL (ref 8.7–10.3)
Chloride: 95 mmol/L — ABNORMAL LOW (ref 96–106)
Creatinine, Ser: 0.79 mg/dL (ref 0.57–1.00)
Globulin, Total: 2.8 g/dL (ref 1.5–4.5)
Glucose: 148 mg/dL — ABNORMAL HIGH (ref 65–99)
Potassium: 5.2 mmol/L (ref 3.5–5.2)
Sodium: 136 mmol/L (ref 134–144)
Total Protein: 7.3 g/dL (ref 6.0–8.5)
eGFR: 78 mL/min/{1.73_m2} (ref >59)

## 2021-01-09 LAB — HEMOGLOBIN A1C
Est. average glucose Bld gHb Est-mCnc: 186 mg/dL
Hgb A1c MFr Bld: 8.1 % — ABNORMAL HIGH (ref 4.8–5.6)

## 2021-01-09 LAB — LIPASE: Lipase: 28 U/L (ref 14–85)

## 2021-01-09 LAB — IRON: Iron: 67 ug/dL (ref 27–139)

## 2021-01-09 MED ORDER — DEXLANSOPRAZOLE 60 MG PO CPDR
1.0000 | DELAYED_RELEASE_CAPSULE | Freq: Two times a day (BID) | ORAL | 3 refills | Status: DC
Start: 2021-01-09 — End: 2021-05-23

## 2021-01-09 MED ORDER — FERROUS GLUCONATE 324 (38 FE) MG PO TABS: 324.0000 mg | ORAL_TABLET | Freq: Every day | ORAL | 1 refills | Status: DC

## 2021-01-09 MED ORDER — TOUJEO SOLOSTAR 300 UNIT/ML ~~LOC~~ SOPN
30.0000 [IU] | PEN_INJECTOR | Freq: Every day | SUBCUTANEOUS | 5 refills | Status: DC
Start: 2021-01-09 — End: 2021-05-23

## 2021-01-09 MED ORDER — BYDUREON BCISE 2 MG/0.85ML ~~LOC~~ AUIJ: AUTO-INJECTOR | SUBCUTANEOUS | 11 refills | Status: DC

## 2021-01-10 ENCOUNTER — Other Ambulatory Visit: Payer: Self-pay | Admitting: Medical

## 2021-01-10 LAB — URINE CULTURE

## 2021-01-10 MED ORDER — SULFAMETHOXAZOLE-TRIMETHOPRIM 800-160 MG PO TABS: 1.0000 | ORAL_TABLET | Freq: Two times a day (BID) | ORAL | 0 refills | Status: DC

## 2021-01-20 DIAGNOSIS — K219 Gastro-esophageal reflux disease without esophagitis: Secondary | ICD-10-CM | POA: Diagnosis not present

## 2021-01-20 DIAGNOSIS — R1314 Dysphagia, pharyngoesophageal phase: Secondary | ICD-10-CM | POA: Diagnosis not present

## 2021-01-20 DIAGNOSIS — J342 Deviated nasal septum: Secondary | ICD-10-CM | POA: Diagnosis not present

## 2021-01-20 DIAGNOSIS — J3489 Other specified disorders of nose and nasal sinuses: Secondary | ICD-10-CM | POA: Diagnosis not present

## 2021-01-23 DIAGNOSIS — E2839 Other primary ovarian failure: Secondary | ICD-10-CM | POA: Diagnosis not present

## 2021-01-23 DIAGNOSIS — M85832 Other specified disorders of bone density and structure, left forearm: Secondary | ICD-10-CM | POA: Diagnosis not present

## 2021-01-23 LAB — HM DEXA SCAN

## 2021-01-28 ENCOUNTER — Inpatient Hospital Stay (HOSPITAL_COMMUNITY): Payer: Medicare Other | Attending: Hematology | Admitting: Hematology

## 2021-01-28 ENCOUNTER — Encounter (HOSPITAL_COMMUNITY): Payer: Self-pay | Admitting: Hematology

## 2021-01-28 ENCOUNTER — Other Ambulatory Visit: Payer: Self-pay

## 2021-01-28 VITALS — BP 160/85 | HR 87 | Temp 97.0°F | Resp 18 | Ht 62.0 in | Wt 201.2 lb

## 2021-01-28 DIAGNOSIS — D72829 Elevated white blood cell count, unspecified: Secondary | ICD-10-CM | POA: Insufficient documentation

## 2021-01-28 DIAGNOSIS — Z800 Family history of malignant neoplasm of digestive organs: Secondary | ICD-10-CM | POA: Diagnosis not present

## 2021-01-28 DIAGNOSIS — Z8616 Personal history of COVID-19: Secondary | ICD-10-CM | POA: Insufficient documentation

## 2021-01-28 DIAGNOSIS — Z8542 Personal history of malignant neoplasm of other parts of uterus: Secondary | ICD-10-CM | POA: Diagnosis not present

## 2021-01-28 NOTE — Patient Instructions (Signed)
[place] at [facility] Discharge Instructions  You were seen today by Dr. Delton Coombes and  Tarri Abernethy PA-C for your elevated white blood cell count (leukocytosis).  We have ordered several lab tests to determine what is causing this abnormality.    LABS: Return this week for 8 AM lab draw at the hospital lab   OTHER TESTS: None  MEDICATIONS: No changes  FOLLOW-UP APPOINTMENT: Return to our office in 3 weeks for follow up   Thank you for choosing [place] at [facility] to provide your oncology and hematology care.  To afford each patient quality time with our provider, please arrive at least 15 minutes before your scheduled appointment time.   If you have a lab appointment with the [facility] please come in thru the Main Entrance and check in at the main information desk.  You need to re-schedule your appointment should you arrive 10 or more minutes late.  We strive to give you quality time with our providers, and arriving late affects you and other patients whose appointments are after yours.  Also, if you no show three or more times for appointments you may be dismissed from the clinic at the providers discretion.     Again, thank you for choosing [facility].  Our hope is that these requests will decrease the amount of time that you wait before being seen by our physicians.       _____________________________________________________________  Should you have questions after your visit to [facility], please contact our office at [phone] and follow the prompts.  Our office hours are 8:00 a.m. and 4:30 p.m. Monday - Friday.  Please note that voicemails left after 4:00 p.m. may not be returned until the following business day.  We are closed weekends and major holidays.  You do have access to a nurse 24-7, just call the main number to the clinic [phone] and do not press any options, hold on the  line and a nurse will answer the phone.    For prescription refill requests, have your pharmacy contact our office and allow 72 hours.    Due to Covid, you will need to wear a mask upon entering the hospital. If you do not have a mask, a mask will be given to you at the Main Entrance upon arrival. For doctor visits, patients may have 1 support person age 68 or older with them. For treatment visits, patients can not have anyone with them due to social distancing guidelines and our immunocompromised population.

## 2021-01-28 NOTE — Progress Notes (Signed)
[facility] [address] [place] [address]   CLINIC:  Medical Oncology/Hematology  CONSULT NOTE  Patient Care Team: Tysinger, Camelia Eng, PA-C as PCP - General (Family Medicine) Minus Breeding, MD as PCP - Cardiology (Cardiology) Sherrlyn Hock, MD as Consulting Physician  CHIEF COMPLAINTS/PURPOSE OF CONSULTATION:  Leukocytosis  HISTORY OF PRESENTING ILLNESS:  Robyn Hill 75 y.o. female is here at the request of her primary care provider Chana Bode, PA-C), due to leukocytosis.  Review of labs shows that the patient has had remittent leukocytosis over the past decade, ranging from normal up to 14.2.  Most recent WBC (01/08/2021) was 14.0 with 9.7 neutrophls.  Patient reports that her WBC has been elevated for many years, but this has never been evaluated to find the cause.  She lifelong on-smoker. Her PMH is significant for diabetes, glaucoma, osteoarthritis, and chronic back pain.   No history of autoimmune or rheumatologic conditions. She is not on any chronic prednisone.  She reports intermittent UTIs.  No fevers.  She reports chronic excessive sweating for many years - she frequently awakes from sleep at night drenched in sweat.  She is extremely fatigued, reports that she has no energy - this has been ongoing since she had COVID in January 2021.  She has lost about 8 pounds over the past 2-3 months, which she attributes to decreased appetite.   She denies abdominal pain.  She denies early satiety.  Patient's mother died secondary to aplastic anemia at age 61.  The patient's two brothers both died from colon cancer.  (The patient is up to date on her colonoscopies per her report).  Heart disease runs in the family.  She is a retired from work as a Network engineer.  She denies any chemical exposures.  She has a history of uterine cancer in her thirties, and has a total hysterectomy.  She did not require chemo or radiation.   MEDICAL HISTORY:  Past  Medical History:  Diagnosis Date  . Adrenal mass, right ([place])   . Allergy   . Anemia   . Asthma    "as a teenager"  . Blind right eye    since childhood  . Blood transfusion 1970  . Chronic back pain   . Chronic diarrhea    Colestipol therapy  . Depression   . Diverticulitis   . Diverticulosis   . Fatty liver   . Female bladder prolapse   . GERD (gastroesophageal reflux disease)   . Glaucoma   . H/O bone density study 11/29/2014   normal study although mild decreased in density from prior study  . H/O hiatal hernia   . Hemorrhoids   . History of kidney stones   . History of uterine cancer    s/p hysterectomy  . Hyperlipidemia   . Hypertension   . Migraine    hx of migraines in past   . Neuromuscular disorder ([place])    diabetic neuropahthy  . Numbness and tingling of both legs   . Osteoarthritis   . Rheumatic fever    age 81  . Type II diabetes mellitus ([place])   . Urinary incontinence   . Vitamin D deficiency     SURGICAL HISTORY: Past Surgical History:  Procedure Laterality Date  . ABDOMINAL HYSTERECTOMY  1980   no further pap smears needed  . ANTERIOR AND POSTERIOR REPAIR N/A 01/29/2015   Procedure: CYSTOCELE AND RECTOCELE ;  Surgeon: Bjorn Loser, MD;  Location: [facility];  Service: Urology;  Laterality: N/A;  . BACK SURGERY    . BREAST BIOPSY  08/1997   right  . CARDIAC CATHETERIZATION  11/2006  . CHOLECYSTECTOMY    . COLONOSCOPY  2014   polyps, external hemorrhoids  . CYSTOSCOPY N/A 01/29/2015   Procedure: CYSTOSCOPY;  Surgeon: Bjorn Loser, MD;  Location: [facility];  Service: Urology;  Laterality: N/A;  . DILATION AND CURETTAGE OF UTERUS  1980  . EDSI for back pain  last 01/2009   multiple x 5  . EYE SURGERY     Bil Blind R eye  . [place]  . LIVER BIOPSY  04/1999  . minor laceration repair     right eye  . POSTERIOR FUSION LUMBAR SPINE  04/05/12  . PULSE GENERATOR IMPLANT Right 03/12/2016   Procedure: Right Reposition of implantable  pulse generator;  Surgeon: Erline Levine, MD;  Location: [facility];  Service: Neurosurgery;  Laterality: Right;  Right Reposition of implantable pulse generator  . SPINAL CORD STIMULATOR INSERTION N/A 09/06/2015   Procedure: LUMBAR SPINAL CORD STIMULATOR INSERTION;  Surgeon: Clydell Hakim, MD;  Location: [facility];  Service: Neurosurgery;  Laterality: N/A;  Spinal Cord Stimulator placement  . SPINAL CORD STIMULATOR INSERTION N/A 10/22/2015   Procedure: Laminectomy for spinal cord stimulator paddle lead and implantable generator placement;  Surgeon: Erline Levine, MD;  Location: [facility];  Service: Neurosurgery;  Laterality: N/A;  Laminectomy for spinal cord stimulator paddle lead and implantable generator placement  . steroid injections of hip  last 02/2009   multiple x 4  . TONSILLECTOMY AND ADENOIDECTOMY  1952  . TRABECULECTOMY  1996   bilaterally; "for glaucoma"  . UPPER GI ENDOSCOPY  05/21/14   food, retained in the body of the stomach  . VAGINAL PROLAPSE REPAIR N/A 01/29/2015   Procedure:  VAULT PROLAPSE WITH GRAFT ;  Surgeon: Bjorn Loser, MD;  Location: [facility];  Service: Urology;  Laterality: N/A;    SOCIAL HISTORY: Social History   Socioeconomic History  . Marital status: Married    Spouse name: Lake Bells   . Number of children: 3  . Years of education: Not on file  . Highest education level: Some college, no degree  Occupational History  . Occupation: Retired   Tobacco Use  . Smoking status: Never Smoker  . Smokeless tobacco: Never Used  Vaping Use  . Vaping Use: Never used  Substance and Sexual Activity  . Alcohol use: Yes    Comment: rare  . Drug use: No  . Sexual activity: Never    Comment: married  Other Topics Concern  . Not on file  Social History Narrative   Three children and two step.     Right handed   Caffeine rarely    Lives at home with husband    Social Determinants of Health   Financial Resource Strain: Low Risk   . Difficulty of Paying  Living Expenses: Not hard at all  Food Insecurity: No Food Insecurity  . Worried About Charity fundraiser in the Last Year: Never true  . Ran Out of Food in the Last Year: Never true  Transportation Needs: No Transportation Needs  . Lack of Transportation (Medical): No  . Lack of Transportation (Non-Medical): No  Physical Activity: Inactive  . Days of Exercise per Week: 0 days  . Minutes of Exercise per Session: 0 min  Stress: No Stress Concern Present  . Feeling of Stress : Not at all  Social Connections: Moderately Integrated  . Frequency of Communication with Friends and Family: More than three times a week  . Frequency of Social Gatherings with Friends and Family: More than three times a week  . Attends Religious Services: More than 4 times per year  . Active Member of Clubs or Organizations: No  . Attends Archivist Meetings: Never  . Marital Status: Married  Human resources officer Violence: Not At Risk  . Fear of Current or Ex-Partner: No  . Emotionally Abused: No  . Physically Abused: No  . Sexually Abused: No    FAMILY HISTORY: Family History  Problem Relation Age of Onset  . Heart attack Brother 3  . Hypertension Other   . Diabetes Other   . Cancer Mother 32  . Aplastic anemia Mother 27  . Heart attack Brother 48  . Colon cancer Brother   . Heart attack Sister   . Heart attack Brother 23  . Anesthesia problems Neg Hx     ALLERGIES:  is allergic to kiwi extract, lyrica [pregabalin], sunflowerseed oil, acetaminophen, oxycodone hcl, hydrocodone-acetaminophen, invokana [canagliflozin], neurontin [gabapentin], nitrofuran derivatives, and tape.  MEDICATIONS:  Current Outpatient Medications  Medication Sig Dispense Refill  . Accu-Chek FastClix Lancets MISC USE 1  LANCET TO CHECK GLUCOSE TWICE DAILY 102 each 0  . atorvastatin (LIPITOR) 40 MG tablet Take 1 tablet (40 mg total) by mouth at bedtime. 90 tablet 3  . B-D UF III MINI PEN NEEDLES 31G X 5 MM MISC USE TO  INJECT TOUJEO AS DIRECTED 100 each 0  . Blood Glucose Calibration (ACCU-CHEK INSTANT CONTROL) LIQD Use for controls for accu-chek activa meter 1 each 0  . Blood Glucose Monitoring Suppl (ACCU-CHEK GUIDE ME) w/Device KIT 1 Device by Does not apply route 2 (two) times daily at 8 am and 10 pm. 1 kit 0  . brimonidine (ALPHAGAN) 0.2 % ophthalmic solution Place 1 drop into the left eye 2 times daily.    . celecoxib (CELEBREX) 200 MG capsule Take 200 mg by mouth 2 (two) times daily.    . Cholecalciferol (VITAMIN D-3) 25 MCG (1000 UT) CAPS Take 1 capsule (1,000 Units total) by mouth daily. 90 capsule 3  . dexlansoprazole (DEXILANT) 60 MG capsule Take 1 capsule (60 mg total) by mouth 2 (two) times daily. 180 capsule 3  . dorzolamide-timolol (COSOPT) 22.3-6.8 MG/ML ophthalmic solution Place 1 drop into both eyes 2 (two) times daily.    . DULoxetine (CYMBALTA) 60 MG capsule Take 1 capsule (60 mg total) by mouth daily. 90 capsule 3  . enalapril (VASOTEC) 20 MG tablet Take 1 tablet (20 mg total) by mouth daily. 90 tablet 3  . Exenatide ER (BYDUREON BCISE) 2 MG/0.85ML AUIJ INJECT CONTENTS OF 1 PEN (2MG) INTO THE SKIN ONCE A WEEK 12 mL 11  . ferrous gluconate (FERGON) 324 MG tablet Take 1 tablet (324 mg total) by mouth daily with breakfast. 90 tablet 1  . glucose blood (ACCU-CHEK GUIDE) test strip USE 1 CLEAN STRIP TO CHECK BLOOD GLUCOSE TWICE DAILY 150 each 0  . hydrochlorothiazide (HYDRODIURIL) 25 MG tablet Take 1 tablet (25 mg total) by mouth daily. 90 tablet 3  . hydrocortisone (PROCTOSOL HC) 2.5 % rectal cream Place 1 application rectally 2 (two) times daily. 30 g 2  . HYDROmorphone (DILAUDID) 4 MG tablet Take 4 mg by mouth every 6 (six) hours as needed.    . insulin glargine, 1 Unit Dial, (TOUJEO SOLOSTAR) 300 UNIT/ML Solostar Pen Inject 30 Units into  the skin daily. 15 mL 5  . Lancets MISC daily.    Marland Kitchen latanoprost (XALATAN) 0.005 % ophthalmic solution Place 1 drop into the right eye at bedtime.    .  levETIRAcetam (KEPPRA) 250 MG tablet Take 250 mg by mouth 2 (two) times daily.    . metFORMIN (GLUCOPHAGE) 500 MG tablet Take 1 tablet (500 mg total) by mouth 2 (two) times daily with a meal. 180 tablet 3  . nortriptyline (PAMELOR) 10 MG capsule TAKE 1 TO 2 CAPSULE BY ORAL ROUTE EVERY DAY AT BEDTIME (90 day supply )    . triamcinolone (KENALOG) 0.1 % Apply topically.    . baclofen (LIORESAL) 10 MG tablet Take 1 tablet (10 mg total) by mouth at bedtime as needed for muscle spasms. (Patient not taking: Reported on 01/28/2021) 90 each 0  . linaclotide (LINZESS) 145 MCG CAPS capsule Take 1 capsule (145 mcg total) by mouth daily before breakfast. 90 capsule 1   No current facility-administered medications for this visit.    REVIEW OF SYSTEMS:   Review of Systems  Constitutional: Positive for appetite change, diaphoresis and fatigue. Negative for chills, fever and unexpected weight change.  HENT:   Negative for lump/mass and nosebleeds.   Eyes: Negative for eye problems.  Respiratory: Negative for cough, hemoptysis and shortness of breath.   Cardiovascular: Negative for chest pain, leg swelling and palpitations.  Gastrointestinal: Positive for blood in stool (Bleeding from hemorrhoids). Negative for abdominal pain, constipation, diarrhea, nausea and vomiting.  Genitourinary: Negative for hematuria.   Skin: Negative.  Negative for rash.  Neurological: Negative for dizziness, headaches and light-headedness.  Hematological: Does not bruise/bleed easily.      PHYSICAL EXAMINATION: ECOG PERFORMANCE STATUS: 1 - Symptomatic but completely ambulatory  Vitals:   01/28/21 1119  BP: (!) 160/85  Pulse: 87  Resp: 18  Temp: (!) 97 F (36.1 C)  SpO2: 95%   Filed Weights   01/28/21 1119  Weight: 201 lb 3.2 oz (91.3 kg)    Physical Exam Constitutional:      Appearance: Normal appearance. She is obese.  HENT:     Head: Normocephalic and atraumatic.     Mouth/Throat:     Mouth: Mucous membranes  are moist.  Eyes:     Extraocular Movements: Extraocular movements intact.     Pupils: Pupils are equal, round, and reactive to light.  Cardiovascular:     Rate and Rhythm: Normal rate and regular rhythm.     Pulses: Normal pulses.     Heart sounds: Normal heart sounds.  Pulmonary:     Effort: Pulmonary effort is normal.     Breath sounds: Normal breath sounds.  Abdominal:     General: Bowel sounds are normal.     Palpations: Abdomen is soft.     Tenderness: There is abdominal tenderness (Tender to deep palpation in LUQ).  Musculoskeletal:        General: No swelling.     Right lower leg: No edema.     Left lower leg: No edema.  Lymphadenopathy:     Head:     Right side of head: No submental, submandibular, tonsillar, preauricular, posterior auricular or occipital adenopathy.     Left side of head: No submental, submandibular, tonsillar, preauricular, posterior auricular or occipital adenopathy.     Cervical: No cervical adenopathy.  Skin:    General: Skin is warm and dry.  Neurological:     General: No focal deficit present.     Mental  Status: She is alert and oriented to person, place, and time.  Psychiatric:        Mood and Affect: Mood normal.        Behavior: Behavior normal.       LABORATORY DATA:  I have reviewed the data as listed Recent Results (from the past 2160 hour(s))  Comprehensive metabolic panel     Status: Abnormal   Collection Time: 01/08/21 10:34 AM  Result Value Ref Range   Glucose 148 (H) 65 - 99 mg/dL   BUN 17 8 - 27 mg/dL   Creatinine, Ser 0.79 0.57 - 1.00 mg/dL   eGFR 78 >59 mL/min/1.73   BUN/Creatinine Ratio 22 12 - 28   Sodium 136 134 - 144 mmol/L   Potassium 5.2 3.5 - 5.2 mmol/L   Chloride 95 (L) 96 - 106 mmol/L   CO2 25 20 - 29 mmol/L   Calcium 10.2 8.7 - 10.3 mg/dL   Total Protein 7.3 6.0 - 8.5 g/dL   Albumin 4.5 3.7 - 4.7 g/dL   Globulin, Total 2.8 1.5 - 4.5 g/dL   Albumin/Globulin Ratio 1.6 1.2 - 2.2   Bilirubin Total 0.4 0.0 -  1.2 mg/dL   Alkaline Phosphatase 148 (H) 44 - 121 IU/L   AST 25 0 - 40 IU/L   ALT 51 (H) 0 - 32 IU/L  CBC with Differential/Platelet     Status: Abnormal   Collection Time: 01/08/21 10:34 AM  Result Value Ref Range   WBC 14.0 (H) 3.4 - 10.8 x10E3/uL   RBC 4.85 3.77 - 5.28 x10E6/uL   Hemoglobin 13.4 11.1 - 15.9 g/dL   Hematocrit 41.2 34.0 - 46.6 %   MCV 85 79 - 97 fL   MCH 27.6 26.6 - 33.0 pg   MCHC 32.5 31.5 - 35.7 g/dL   RDW 11.3 (L) 11.7 - 15.4 %   Platelets 391 150 - 450 x10E3/uL   Neutrophils 69 Not Estab. %   Lymphs 21 Not Estab. %   Monocytes 6 Not Estab. %   Eos 3 Not Estab. %   Basos 1 Not Estab. %   Neutrophils Absolute 9.7 (H) 1.4 - 7.0 x10E3/uL   Lymphocytes Absolute 3.0 0.7 - 3.1 x10E3/uL   Monocytes Absolute 0.9 0.1 - 0.9 x10E3/uL   EOS (ABSOLUTE) 0.4 0.0 - 0.4 x10E3/uL   Basophils Absolute 0.1 0.0 - 0.2 x10E3/uL   Immature Granulocytes 0 Not Estab. %   Immature Grans (Abs) 0.0 0.0 - 0.1 x10E3/uL  Hemoglobin A1c     Status: Abnormal   Collection Time: 01/08/21 10:34 AM  Result Value Ref Range   Hgb A1c MFr Bld 8.1 (H) 4.8 - 5.6 %    Comment:          Prediabetes: 5.7 - 6.4          Diabetes: >6.4          Glycemic control for adults with diabetes: <7.0    Est. average glucose Bld gHb Est-mCnc 186 mg/dL  Iron     Status: None   Collection Time: 01/08/21 10:34 AM  Result Value Ref Range   Iron 67 27 - 139 ug/dL  Lipase     Status: None   Collection Time: 01/08/21 10:34 AM  Result Value Ref Range   Lipase 28 14 - 85 U/L  Urine Culture     Status: Abnormal   Collection Time: 01/08/21 10:44 AM   Specimen: Urine   UR  Result Value Ref Range  Urine Culture, Routine Final report (A)    Organism ID, Bacteria Escherichia coli (A)     Comment: Multi-Drug Resistant Organism Greater than 100,000 colony forming units per mL Susceptibility profile is consistent with a probable ESBL.    Antimicrobial Susceptibility Comment     Comment:       ** S = Susceptible;  I = Intermediate; R = Resistant **                    P = Positive; N = Negative             MICS are expressed in micrograms per mL    Antibiotic                 RSLT#1    RSLT#2    RSLT#3    RSLT#4 Amoxicillin/Clavulanic Acid    R Ampicillin                     R Cefazolin                      R Cefepime                       R Ceftriaxone                    R Cefuroxime                     R Ciprofloxacin                  R Ertapenem                      S Gentamicin                     R Imipenem                       S Levofloxacin                   R Meropenem                      S Nitrofurantoin                 R Piperacillin/Tazobactam        I Tetracycline                   S Tobramycin                     R Trimethoprim/Sulfa             S   POCT Urinalysis DIP (Proadvantage Device)     Status: Abnormal   Collection Time: 01/08/21 12:27 PM  Result Value Ref Range   Color, UA yellow yellow   Clarity, UA clear clear   Glucose, UA negative negative mg/dL   Bilirubin, UA negative negative   Ketones, POC UA negative negative mg/dL   Specific Gravity, Urine 1.015    Blood, UA negative negative   pH, UA 6.5 5.0 - 8.0   Protein Ur, POC trace (A) negative mg/dL   Urobilinogen, Ur 0.2    Nitrite, UA Negative Negative   Leukocytes, UA Small (1+) (A) Negative  HM DEXA SCAN     Status: None   Collection Time: 01/23/21  12:00 AM  Result Value Ref Range   HM Dexa Scan low bone mass   CBC with Differential     Status: Abnormal   Collection Time: 01/29/21  8:51 AM  Result Value Ref Range   WBC 10.3 4.0 - 10.5 K/uL   RBC 4.84 3.87 - 5.11 MIL/uL   Hemoglobin 13.3 12.0 - 15.0 g/dL   HCT 42.9 36.0 - 46.0 %   MCV 88.6 80.0 - 100.0 fL   MCH 27.5 26.0 - 34.0 pg   MCHC 31.0 30.0 - 36.0 g/dL   RDW 11.9 11.5 - 15.5 %   Platelets 326 150 - 400 K/uL   nRBC 0.0 0.0 - 0.2 %   Neutrophils Relative % 63 %   Neutro Abs 6.5 1.7 - 7.7 K/uL   Lymphocytes Relative 23 %   Lymphs Abs 2.4  0.7 - 4.0 K/uL   Monocytes Relative 7 %   Monocytes Absolute 0.7 0.1 - 1.0 K/uL   Eosinophils Relative 5 %   Eosinophils Absolute 0.5 0.0 - 0.5 K/uL   Basophils Relative 1 %   Basophils Absolute 0.1 0.0 - 0.1 K/uL   Immature Granulocytes 1 %   Abs Immature Granulocytes 0.11 (H) 0.00 - 0.07 K/uL    Comment: Performed at [facility], [address], [place], [place] [address]  BCR-ABL1 FISH     Status: None   Collection Time: 01/29/21  8:51 AM  Result Value Ref Range   Specimen Type BLOOD    Cells Counted 200    Cells Analyzed 200    FISH Result Comment:     Comment: NORMAL:  NO BCR OR ABL1 GENE REARRANGEMENT OBSERVED   Interpretation Comment:     Comment: (NOTE)             nuc ish 9q34(ASS1,ABL1)x2,22q11.2(BCRx2)[200].      The fluorescence in situ hybridization (FISH) study was normal. FISH, using unique sequence DNA probes for the ABL1 and BCR gene regions showed two ABL1 signals (red), two control ASS1 gene signals (aqua) located adjacent to the ABL1 locus at 9q34, and two BCR signals (green) at 22q11.2 in all interphase nuclei examined. There was NO evidence of CML or ALL-associated BCR/ABL1 dual fusion signals in this analysis. .      This analysis is limited to abnormalities detectable by the specific probes included in the study. FISH results should be interpreted within the context of a full cytogenetic analysis and pathology evaluation.  A BCR-ABL1 gene fusion in greater than 3 interphase nuclei in a patient with a new clinical diagnosis is considered positive. The DNA probe vendor for this study was [employer], research (physical sciences)). .      This test was developed and its performance characteristics determined by  [facility]). It has not been cleared or approved by the U.S. Food and Drug Administration.    Director Review: Comment:     Comment: (NOTE) Maylene Roes, PHD, [place] Performed At: [facility] [address] [place], [place] [id number] Katina Degree MDPhD [id number]   Sedimentation rate     Status: None   Collection Time: 01/29/21  8:51 AM  Result Value Ref Range   Sed Rate 10 0 - 22 mm/hr    Comment: Performed at [facility], [address], [place], [place] [address]  C-reactive protein     Status: None   Collection Time: 01/29/21  8:51 AM  Result Value Ref Range   CRP  0.6 <1.0 mg/dL    Comment: Performed at [facility], [address] [place], [place] [address]  JAK2 genotypr     Status: None   Collection Time: 01/29/21  8:51 AM  Result Value Ref Range   JAK2 GenotypR Comment     Comment: (NOTE) Result: NEGATIVE for the JAK2 V617F mutation. Interpretation:  The G to T nucleotide change encoding the V617F mutation was not detected.  This result does not rule out the presence of the JAK2 mutation at a level below the sensitivity of detection of this assay, or the presence of other mutations within JAK2 not detected by this assay.  This result does not rule out a diagnosis of polycythemia vera, essential thrombocythemia or idiopathic myelofibrosis as the V617F mutation is not detected in all patients with these disorders.    Director Review, JAK2 Comment     Comment: (NOTE) Constance Goltz, PhD, [facility]               Director, [place] for [place] and [place], [place]               [phone] This test was developed and its performance characteristics determined by [facility]. It has not been cleared or approved by the Food and Drug Administration. Performed At: [facility] [address] [place], [place] [id number] Katina Degree MDPhD [id number] Performed At: [facility] [address] [place], [place] [id number] Katina Degree MDPhD [id number]    BACKGROUND: Comment     Comment: (NOTE) JAK2 is a cytoplasmic tyrosine kinase with a key role in signal transduction from  multiple hematopoietic growth factor receptors. A point mutation within exon 14 of the JAK2 gene ([id number]) encoding a valine to phenylalanine substitution at position 617 of the JAK2 protein (V617F) has been identified in most patients with polycythemia vera, and in about half of those with either essential thrombocythemia or idiopathic myelofibrosis. The V617F has also been detected, although infrequently, in other myeloid disorders such as chronic myelomonocytic leukemia and chronic neutrophilic luekemia. V617F is an acquired mutation that alters a highly conserved valine present in the negative regulatory JH2 domain of the JAK2 protein and is predicted to dysregulate kinase activity. Methodology: Total genomic DNA was extracted and subjected to TaqMan real-time PCR amplification/detection. Two amplification products per sample were monitored by real-time PCR using primers/probes s pecific to JAK2 wild type (WT) and JAK2 mutant V617F. The ABI7900 Absolute Quantitation software will compare the patient specimen valuse to the standard curves and generate percent values for wild type and mutant type. In vitro studies have indicated that this assay has an analytical sensitivity of 1%. References: Baxter EJ, Scott Phineas Real, et al. Acquired mutation of the tyrosine kinase JAK2 in human myeloproliferative disorders. Lancet. 2005 Mar 19-25; 365(9464):1054-1061. Alfonso Ramus Couedic JP. A unique clonal JAK2 mutation leading to constitutive signaling causes polycythaemia vera. Nature. 2005 Apr 28; 434(7037):1144-1148. Kralovics R, Passamonti F, Buser AS, et al. A gain-of-function mutation of JAK2 in myeloproliferative disorders. N Engl J Med. 2005 Apr 28; 352(17):1779-1790.   ANA, IFA (with reflex)     Status: None   Collection Time: 01/29/21  8:51 AM  Result Value Ref  Range   ANA Ab, IFA Negative     Comment: (NOTE)                                     Negative   <1:80                                      Borderline  1:80                                     Positive   >1:80 ICAP nomenclature: AC-0 For more information about Hep-2 cell patterns use ANApatterns.org, the official website for the International Consensus on Antinuclear Antibody (ANA) Patterns (ICAP). Performed At: [facility] [place], [place] [id number] Rush Farmer MD [id number]   Rheumatoid factor     Status: None   Collection Time: 01/29/21  8:51 AM  Result Value Ref Range   Rhuematoid fact SerPl-aCnc <10.0 <14.0 IU/mL    Comment: (NOTE) Performed At: [facility] [place], [place] [id number] Rush Farmer MD [id number]   [place]     Status: None   Collection Time: 01/29/21  8:51 AM  Result Value Ref Range   C206 ACTH 9.3 7.2 - 63.3 pg/mL    Comment: (NOTE) ACTH reference interval for samples collected between 7 and 10 AM. Performed At: [facility] [place], [place] [id number] Rush Farmer MD [id number]   Cortisol     Status: None   Collection Time: 01/29/21  8:51 AM  Result Value Ref Range   Cortisol, Plasma 16.2 ug/dL    Comment: (NOTE) AM    6.7 - 22.6 ug/dL PM   <10.0       ug/dL Performed at [facility], [address] [place], [place] [address]     RADIOGRAPHIC STUDIES: I have personally reviewed the radiological images as listed and agreed with the findings in the report. DG Chest 2 View  Result Date: 01/30/2021 CLINICAL DATA:  Lower rib pain for the past year.  No known injury. EXAM: CHEST - 2 VIEW COMPARISON:  10/23/2013 FINDINGS: Grossly unchanged cardiac silhouette and mediastinal contours with atherosclerotic plaque within the thoracic aorta. Thoracic aorta appears mildly tortuous. The lungs remain hyperexpanded with flattening of the diaphragms and mild eventration of the right hemidiaphragm. Mild thickening the pulmonary interstitium, unchanged. No pleural effusion or  pneumothorax. No evidence of edema. No acute osseous abnormalities. Mild-to-moderate scoliotic curvature of the thoracolumbar spine with associated multilevel DDD, incompletely evaluated. Post lumbar paraspinal fusion, incompletely evaluated. Post cholecystectomy. IMPRESSION: Similar findings of lung hyperexpansion and chronic bronchitic change without superimposed acute cardiopulmonary disease. Electronically Signed   By: Sandi Mariscal M.D.   On: 01/30/2021 09:13   DG Abd 1 View  Result Date: 01/30/2021 CLINICAL DATA:  Upper abdominal pain. EXAM: ABDOMEN - 1 VIEW COMPARISON:  None. FINDINGS: Large colonic stool burden without evidence of enteric obstruction. No pneumoperitoneum, pneumatosis or portal venous gas. No definitive abnormal intra-abdominal calcifications given overlying colonic stool burden. Post cholecystectomy. Post lumbar paraspinal fusion intervertebral disc space replacement, incompletely evaluated. Moderate scoliotic curvature of the thoracolumbar spine with associated moderate to severe multilevel DDD, incompletely evaluated. IMPRESSION: Large colonic stool burden without evidence of enteric obstruction. Electronically  Signed   By: Sandi Mariscal M.D.   On: 01/30/2021 09:12    ASSESSMENT: 1.  Leukocytosis, neutrophil predominant -Review of labs shows that the patient has had re-mittent leukocytosis over the past decade, ranging from normal up to 14.2.   -Most recent WBC (01/08/2021) was 14.0 with 9.7 neutrophils; normal hemoglobin and platelets -Admits to chronic fatigue and chronic hyperhydrosis (including night sweats); has lost 8 pounds over the past 3 months secondary to decreased appetitite -CT abdomen and pelvis in 07/17/2019 showed normal spleen and stable right adrenal adenoma -She is a lifelong nonsmoker no history of rheumatologic or autoimmune diseases; she is not on steroids  2. Other history -Lifelong nonsmoker.  No alcohol or illicit drug use -She has a history of uterine  cancer in her thirties, and had a total hysterectomy/bilateral oophorectomy.  She did not require chemo or radiation. -Patient's mother died secondary to aplastic anemia at age 75.  The patient's two brothers both died from colon cancer.  (The patient is up to date on her colonoscopies per her report).  -She is a retired from work as a Network engineer.  She denies any chemical exposures. -PMH:  Diabetes, glaucoma, right eye blindness, chronic back pain, osteoarthritis, and hypertension   PLAN:  1.  Leukocytosis, neutrophil predominant -Check repeat CBC as well as BCR/ABL FISH, ESR, CRP, JAK2, ANA, rheumatoid factor -Patient does have a history of right adrenal adenoma - will check ACTH and serum cortisol to determine if secretory adenoma could be causing leukocytosis -RTC in 3 weeks to discuss results   PLAN SUMMARY & DISPOSITION: -Check labs and RTC in 3 weeks to discuss results  All questions were answered. The patient knows to call the clinic with any problems, questions or concerns.   Medical decision making: Moderate (1 illness under work-up with uncertain prognosis, review of external notes, review of external testing, ordering new tests)  Time spent on visit: I spent 30 minutes counseling the patient face to face. The total time spent in the appointment was 45 minutes and more than 50% was on counseling.   I, Tarri Abernethy PA-C, have seen this patient in conjunction with Dr. Derek Jack. Greater than 50% of visit was performed by Dr. Delton Coombes.  Addendum: I have independently evaluated this patient for work-up of leukocytosis and agree with HPI written by Casey Burkitt, PA-C.  She has leukocytosis since August 2021.  We will check for myeloproliferative disorders including BCR/ABL and JAK2 V6 18F mutation.  She is a non-smoker.  We will also do work-up for right adrenal adenoma.  RTC 3 weeks to discuss results.   Derek Jack, MD 02/15/21 2:06 PM

## 2021-01-29 ENCOUNTER — Telehealth: Payer: Self-pay | Admitting: Medical

## 2021-01-29 ENCOUNTER — Inpatient Hospital Stay (HOSPITAL_COMMUNITY): Payer: Medicare Other

## 2021-01-29 ENCOUNTER — Ambulatory Visit (HOSPITAL_COMMUNITY)
Admission: RE | Admit: 2021-01-29 | Discharge: 2021-01-29 | Disposition: A | Discharge status: Home / Self Care | Payer: Medicare Other | Source: Ambulatory Visit | Attending: Medical | Admitting: Medical

## 2021-01-29 ENCOUNTER — Other Ambulatory Visit: Payer: Self-pay

## 2021-01-29 DIAGNOSIS — D72829 Elevated white blood cell count, unspecified: Secondary | ICD-10-CM | POA: Insufficient documentation

## 2021-01-29 DIAGNOSIS — R053 Chronic cough: Secondary | ICD-10-CM | POA: Diagnosis not present

## 2021-01-29 DIAGNOSIS — R0781 Pleurodynia: Secondary | ICD-10-CM | POA: Diagnosis not present

## 2021-01-29 DIAGNOSIS — R109 Unspecified abdominal pain: Secondary | ICD-10-CM | POA: Diagnosis not present

## 2021-01-29 DIAGNOSIS — R1012 Left upper quadrant pain: Secondary | ICD-10-CM | POA: Insufficient documentation

## 2021-01-29 DIAGNOSIS — R1010 Upper abdominal pain, unspecified: Secondary | ICD-10-CM | POA: Diagnosis not present

## 2021-01-29 LAB — CBC WITH DIFFERENTIAL/PLATELET
Abs Immature Granulocytes: 0.11 10*3/uL — ABNORMAL HIGH (ref 0.00–0.07)
Basophils Absolute: 0.1 10*3/uL (ref 0.0–0.1)
Basophils Relative: 1 %
Eosinophils Absolute: 0.5 10*3/uL (ref 0.0–0.5)
Eosinophils Relative: 5 %
HCT: 42.9 % (ref 36.0–46.0)
Hemoglobin: 13.3 g/dL (ref 12.0–15.0)
Immature Granulocytes: 1 %
Lymphocytes Relative: 23 %
Lymphs Abs: 2.4 10*3/uL (ref 0.7–4.0)
MCH: 27.5 pg (ref 26.0–34.0)
MCHC: 31 g/dL (ref 30.0–36.0)
MCV: 88.6 fL (ref 80.0–100.0)
Monocytes Absolute: 0.7 10*3/uL (ref 0.1–1.0)
Monocytes Relative: 7 %
Neutro Abs: 6.5 10*3/uL (ref 1.7–7.7)
Neutrophils Relative %: 63 %
Platelets: 326 10*3/uL (ref 150–400)
RBC: 4.84 MIL/uL (ref 3.87–5.11)
RDW: 11.9 % (ref 11.5–15.5)
WBC: 10.3 10*3/uL (ref 4.0–10.5)
nRBC: 0 % (ref 0.0–0.2)

## 2021-01-29 LAB — CORTISOL: Cortisol, Plasma: 16.2 ug/dL

## 2021-01-29 LAB — C-REACTIVE PROTEIN: CRP: 0.6 mg/dL (ref <1.0)

## 2021-01-29 LAB — SEDIMENTATION RATE: Sed Rate: 10 mm/hr (ref 0–22)

## 2021-01-29 NOTE — Telephone Encounter (Signed)
Your bone density shows osteopenia/low bone mass.    Recommendations:  Exercise regularly including walking, chair exercises, or pool exercise,  and weight bearing exercise.   Weight-bearing physical activity and exercises that improve balance and posture can strengthen bones and reduce the chance of a fracture. The more active and fit you are as you age, the less likely you are to fall and break a bone.   Good nutrition. Eat a healthy diet and make certain that you're getting 1200mg  of calcium daily in the diet from dairy (typically 4 servings) or supplements OTC.   continue getting Vitamin D through eating fish, seafood, getting sun exposure, and using  Vitamin D supplement.

## 2021-01-29 NOTE — Telephone Encounter (Signed)
Patient informed via my chart.

## 2021-01-30 LAB — ACTH: C206 ACTH: 9.3 pg/mL (ref 7.2–63.3)

## 2021-01-30 LAB — RHEUMATOID FACTOR: Rheumatoid fact SerPl-aCnc: <10 IU/mL (ref <14.0)

## 2021-01-31 LAB — ANTINUCLEAR ANTIBODIES, IFA: ANA Ab, IFA: NEGATIVE

## 2021-02-04 ENCOUNTER — Encounter: Payer: Self-pay | Admitting: Medical

## 2021-02-05 LAB — JAK2 GENOTYPR

## 2021-02-06 LAB — BCR-ABL1 FISH
Cells Analyzed: 200
Cells Counted: 200

## 2021-02-10 ENCOUNTER — Other Ambulatory Visit: Payer: Self-pay | Admitting: Medical

## 2021-02-10 MED ORDER — LINACLOTIDE 145 MCG PO CAPS: 145.0000 ug | ORAL_CAPSULE | Freq: Every day | ORAL | 1 refills | Status: DC

## 2021-02-12 DIAGNOSIS — M5416 Radiculopathy, lumbar region: Secondary | ICD-10-CM | POA: Diagnosis not present

## 2021-02-18 ENCOUNTER — Inpatient Hospital Stay (HOSPITAL_COMMUNITY): Payer: Medicare Other | Attending: Hematology | Admitting: Hematology

## 2021-02-18 ENCOUNTER — Other Ambulatory Visit: Payer: Self-pay

## 2021-02-18 VITALS — BP 136/63 | HR 71 | Temp 96.9°F | Resp 18 | Wt 198.2 lb

## 2021-02-18 DIAGNOSIS — D72829 Elevated white blood cell count, unspecified: Secondary | ICD-10-CM | POA: Insufficient documentation

## 2021-02-18 NOTE — Patient Instructions (Signed)
[place] [facility] Discharge Instructions  You were seen today by Dr. Delton Coombes. He went over your recent results. Your leukemia labs came back negative. Your next appointment will be with the physician assistant in 6 months for labs and follow up.   Thank you for choosing [place] at [facility] to provide your oncology and hematology care.  To afford each patient quality time with our provider, please arrive at least 15 minutes before your scheduled appointment time.   If you have a lab appointment with the [facility] please come in thru the Main Entrance and check in at the main information desk  You need to re-schedule your appointment should you arrive 10 or more minutes late.  We strive to give you quality time with our providers, and arriving late affects you and other patients whose appointments are after yours.  Also, if you no show three or more times for appointments you may be dismissed from the clinic at the providers discretion.     Again, thank you for choosing [facility].  Our hope is that these requests will decrease the amount of time that you wait before being seen by our physicians.       _____________________________________________________________  Should you have questions after your visit to [facility], please contact our office at [phone] between the hours of 8:00 a.m. and 4:30 p.m.  Voicemails left after 4:00 p.m. will not be returned until the following business day.  For prescription refill requests, have your pharmacy contact our office and allow 72 hours.    Cancer Center Support Programs:   > Cancer Support Group  2nd Tuesday of the month 1pm-2pm, Journey Room

## 2021-02-18 NOTE — Progress Notes (Signed)
[place], [place] [address]   CLINIC:  Medical Oncology/Hematology  PCP:  Caryl Ada [address] / [place] [address]  [phone]  REASON FOR VISIT:  Follow-up for leukocytosis  PRIOR THERAPY: None  CURRENT THERAPY: Observation  INTERVAL HISTORY:  [name], a 75 y.o. female, returns for routine follow-up for her leukocytosis. Shevon was last seen on 01/28/2021.  Today she reports feeling okay. She reports that she had steroid injections injected into her back last week and helped relieve the pain for 1 day, but then the pain started returning.   REVIEW OF SYSTEMS:  Review of Systems  Constitutional: Positive for appetite change and fatigue (depleted).  Musculoskeletal: Positive for back pain (3/10 chronic back pain).  Neurological: Positive for headaches.  All other systems reviewed and are negative.   PAST MEDICAL/SURGICAL HISTORY:  Past Medical History:  Diagnosis Date  . Adrenal mass, right ([place])   . Allergy   . Anemia   . Asthma    "as a teenager"  . Blind right eye    since childhood  . Blood transfusion 1970  . Chronic back pain   . Chronic diarrhea    Colestipol therapy  . Depression   . Diverticulitis   . Diverticulosis   . Fatty liver   . Female bladder prolapse   . GERD (gastroesophageal reflux disease)   . Glaucoma   . H/O bone density study 11/29/2014   normal study although mild decreased in density from prior study  . H/O hiatal hernia   . Hemorrhoids   . History of kidney stones   . History of uterine cancer    s/p hysterectomy  . Hyperlipidemia   . Hypertension   . Migraine    hx of migraines in past   . Neuromuscular disorder ([place])    diabetic neuropahthy  . Numbness and tingling of both legs   . Osteoarthritis   . Rheumatic fever    age 49  . Type II diabetes mellitus ([place])   . Urinary incontinence   . Vitamin D deficiency    Past Surgical History:   Procedure Laterality Date  . ABDOMINAL HYSTERECTOMY  1980   no further pap smears needed  . ANTERIOR AND POSTERIOR REPAIR N/A 01/29/2015   Procedure: CYSTOCELE AND RECTOCELE ;  Surgeon: Bjorn Loser, MD;  Location: [facility];  Service: Urology;  Laterality: N/A;  . BACK SURGERY    . BREAST BIOPSY  08/1997   right  . CARDIAC CATHETERIZATION  11/2006  . CHOLECYSTECTOMY    . COLONOSCOPY  2014   polyps, external hemorrhoids  . CYSTOSCOPY N/A 01/29/2015   Procedure: CYSTOSCOPY;  Surgeon: Bjorn Loser, MD;  Location: [facility];  Service: Urology;  Laterality: N/A;  . DILATION AND CURETTAGE OF UTERUS  1980  . EDSI for back pain  last 01/2009   multiple x 5  . EYE SURGERY     Bil Blind R eye  . [place]  . LIVER BIOPSY  04/1999  . minor laceration repair     right eye  . POSTERIOR FUSION LUMBAR SPINE  04/05/12  . PULSE GENERATOR IMPLANT Right 03/12/2016   Procedure: Right Reposition of implantable pulse generator;  Surgeon: Erline Levine, MD;  Location: [place];  Service: Neurosurgery;  Laterality: Right;  Right Reposition of implantable pulse generator  . SPINAL CORD STIMULATOR INSERTION N/A 09/06/2015   Procedure: LUMBAR SPINAL CORD  STIMULATOR INSERTION;  Surgeon: Clydell Hakim, MD;  Location: [place];  Service: Neurosurgery;  Laterality: N/A;  Spinal Cord Stimulator placement  . SPINAL CORD STIMULATOR INSERTION N/A 10/22/2015   Procedure: Laminectomy for spinal cord stimulator paddle lead and implantable generator placement;  Surgeon: Erline Levine, MD;  Location: [place] [facility];  Service: Neurosurgery;  Laterality: N/A;  Laminectomy for spinal cord stimulator paddle lead and implantable generator placement  . steroid injections of hip  last 02/2009   multiple x 4  . TONSILLECTOMY AND ADENOIDECTOMY  1952  . TRABECULECTOMY  1996   bilaterally; "for glaucoma"  . UPPER GI ENDOSCOPY  05/21/14   food, retained in the body of the stomach  . VAGINAL PROLAPSE REPAIR N/A  01/29/2015   Procedure:  VAULT PROLAPSE WITH GRAFT ;  Surgeon: Bjorn Loser, MD;  Location: [facility];  Service: Urology;  Laterality: N/A;    SOCIAL HISTORY:  Social History   Socioeconomic History  . Marital status: Married    Spouse name: Lake Bells   . Number of children: 3  . Years of education: Not on file  . Highest education level: Some college, no degree  Occupational History  . Occupation: Retired   Tobacco Use  . Smoking status: Never Smoker  . Smokeless tobacco: Never Used  Vaping Use  . Vaping Use: Never used  Substance and Sexual Activity  . Alcohol use: Yes    Comment: rare  . Drug use: No  . Sexual activity: Never    Comment: married  Other Topics Concern  . Not on file  Social History Narrative   Three children and two step.     Right handed   Caffeine rarely    Lives at home with husband    Social Determinants of Health   Financial Resource Strain: Low Risk   . Difficulty of Paying Living Expenses: Not hard at all  Food Insecurity: No Food Insecurity  . Worried About Charity fundraiser in the Last Year: Never true  . Ran Out of Food in the Last Year: Never true  Transportation Needs: No Transportation Needs  . Lack of Transportation (Medical): No  . Lack of Transportation (Non-Medical): No  Physical Activity: Inactive  . Days of Exercise per Week: 0 days  . Minutes of Exercise per Session: 0 min  Stress: No Stress Concern Present  . Feeling of Stress : Not at all  Social Connections: Moderately Integrated  . Frequency of Communication with Friends and Family: More than three times a week  . Frequency of Social Gatherings with Friends and Family: More than three times a week  . Attends Religious Services: More than 4 times per year  . Active Member of Clubs or Organizations: No  . Attends Archivist Meetings: Never  . Marital Status: Married  Human resources officer Violence: Not At Risk  . Fear of Current or Ex-Partner: No  . Emotionally  Abused: No  . Physically Abused: No  . Sexually Abused: No    FAMILY HISTORY:  Family History  Problem Relation Age of Onset  . Heart attack Brother 80  . Hypertension Other   . Diabetes Other   . Cancer Mother 19  . Aplastic anemia Mother 58  . Heart attack Brother 34  . Colon cancer Brother   . Heart attack Sister   . Heart attack Brother 44  . Anesthesia problems Neg Hx     CURRENT MEDICATIONS:  Current Outpatient Medications  Medication Sig Dispense  Refill  . Accu-Chek FastClix Lancets MISC USE 1  LANCET TO CHECK GLUCOSE TWICE DAILY 102 each 0  . atorvastatin (LIPITOR) 40 MG tablet Take 1 tablet (40 mg total) by mouth at bedtime. 90 tablet 3  . B-D UF III MINI PEN NEEDLES 31G X 5 MM MISC USE TO INJECT TOUJEO AS DIRECTED 100 each 0  . baclofen (LIORESAL) 10 MG tablet Take 1 tablet (10 mg total) by mouth at bedtime as needed for muscle spasms. 90 each 0  . Blood Glucose Calibration (ACCU-CHEK INSTANT CONTROL) LIQD Use for controls for accu-chek activa meter 1 each 0  . Blood Glucose Monitoring Suppl (ACCU-CHEK GUIDE ME) w/Device KIT 1 Device by Does not apply route 2 (two) times daily at 8 am and 10 pm. 1 kit 0  . brimonidine (ALPHAGAN) 0.2 % ophthalmic solution Place 1 drop into the left eye 2 times daily.    . celecoxib (CELEBREX) 200 MG capsule Take 200 mg by mouth 2 (two) times daily.    . cetirizine (ZYRTEC) 10 MG tablet Take 10 mg by mouth daily.    . Cholecalciferol (VITAMIN D-3) 25 MCG (1000 UT) CAPS Take 1 capsule (1,000 Units total) by mouth daily. 90 capsule 3  . dexlansoprazole (DEXILANT) 60 MG capsule Take 1 capsule (60 mg total) by mouth 2 (two) times daily. 180 capsule 3  . dorzolamide-timolol (COSOPT) 22.3-6.8 MG/ML ophthalmic solution Place 1 drop into both eyes 2 (two) times daily.    . DULoxetine (CYMBALTA) 60 MG capsule Take 1 capsule (60 mg total) by mouth daily. 90 capsule 3  . enalapril (VASOTEC) 20 MG tablet Take 1 tablet (20 mg total) by mouth daily. 90  tablet 3  . Exenatide ER (BYDUREON BCISE) 2 MG/0.85ML AUIJ INJECT CONTENTS OF 1 PEN (2MG) INTO THE SKIN ONCE A WEEK 12 mL 11  . ferrous gluconate (FERGON) 324 MG tablet Take 1 tablet (324 mg total) by mouth daily with breakfast. 90 tablet 1  . glucose blood (ACCU-CHEK GUIDE) test strip USE 1 CLEAN STRIP TO CHECK BLOOD GLUCOSE TWICE DAILY 150 each 0  . hydrochlorothiazide (HYDRODIURIL) 25 MG tablet Take 1 tablet (25 mg total) by mouth daily. 90 tablet 3  . hydrocortisone (PROCTOSOL HC) 2.5 % rectal cream Place 1 application rectally 2 (two) times daily. 30 g 2  . HYDROmorphone (DILAUDID) 4 MG tablet Take 4 mg by mouth every 6 (six) hours as needed.    . insulin glargine, 1 Unit Dial, (TOUJEO SOLOSTAR) 300 UNIT/ML Solostar Pen Inject 30 Units into the skin daily. 15 mL 5  . Lancets MISC daily.    Marland Kitchen latanoprost (XALATAN) 0.005 % ophthalmic solution Place 1 drop into the right eye at bedtime.    . levETIRAcetam (KEPPRA) 250 MG tablet Take 250 mg by mouth 2 (two) times daily.    Marland Kitchen linaclotide (LINZESS) 145 MCG CAPS capsule Take 1 capsule (145 mcg total) by mouth daily before breakfast. 90 capsule 1  . metFORMIN (GLUCOPHAGE) 500 MG tablet Take 1 tablet (500 mg total) by mouth 2 (two) times daily with a meal. 180 tablet 3  . nortriptyline (PAMELOR) 10 MG capsule TAKE 1 TO 2 CAPSULE BY ORAL ROUTE EVERY DAY AT BEDTIME (90 day supply )    . triamcinolone (KENALOG) 0.1 % Apply topically.     No current facility-administered medications for this visit.    ALLERGIES:  Allergies  Allergen Reactions  . Kiwi Extract Hives  . Lyrica [Pregabalin] Other (See Comments)  psychosis  . Sunflowerseed Oil Hives  . Acetaminophen Nausea Only  . Oxycodone Hcl Nausea Only  . Hydrocodone-Acetaminophen Nausea Only    "Extended release form only"  . Invokana [Canagliflozin] Other (See Comments)    UTI  . Neurontin [Gabapentin] Other (See Comments)    Intolerance/ineffective  . Nitrofuran Derivatives Nausea Only  and Other (See Comments)    stomach pain and weight loss  . Tape Other (See Comments)    Pulls skin off, can use paper tape    PHYSICAL EXAM:  Performance status (ECOG): 1 - Symptomatic but completely ambulatory  Vitals:   02/18/21 0911  BP: 136/63  Pulse: 71  Resp: 18  Temp: (!) 96.9 F (36.1 C)  SpO2: 97%   Wt Readings from Last 3 Encounters:  02/18/21 198 lb 3.2 oz (89.9 kg)  01/28/21 201 lb 3.2 oz (91.3 kg)  01/08/21 201 lb 9.6 oz (91.4 kg)   Physical Exam Vitals reviewed.  Constitutional:      Appearance: Normal appearance.     Comments: In wheelchair  Neurological:     General: No focal deficit present.     Mental Status: She is alert and oriented to person, place, and time.  Psychiatric:        Mood and Affect: Mood normal.        Behavior: Behavior normal.     LABORATORY DATA:  I have reviewed the labs as listed.  CBC Latest Ref Rng & Units 01/29/2021 01/08/2021 10/09/2020  WBC 4.0 - 10.5 K/uL 10.3 14.0(H) 11.7(H)  Hemoglobin 12.0 - 15.0 g/dL 13.3 13.4 13.6  Hematocrit 36.0 - 46.0 % 42.9 41.2 40.1  Platelets 150 - 400 K/uL 326 391 354   CMP Latest Ref Rng & Units 01/08/2021 10/09/2020 06/05/2020  Glucose 65 - 99 mg/dL 148(H) - 129(H)  BUN 8 - 27 mg/dL 17 - 28(H)  Creatinine 0.57 - 1.00 mg/dL 0.79 - 0.84  Sodium 134 - 144 mmol/L 136 - 139  Potassium 3.5 - 5.2 mmol/L 5.2 - 5.0  Chloride 96 - 106 mmol/L 95(L) - 97  CO2 20 - 29 mmol/L 25 - 28  Calcium 8.7 - 10.3 mg/dL 10.2 10.2 10.5(H)  Total Protein 6.0 - 8.5 g/dL 7.3 - 7.5  Total Bilirubin 0.0 - 1.2 mg/dL 0.4 - 0.4  Alkaline Phos 44 - 121 IU/L 148(H) 109 168(H)  AST 0 - 40 IU/L 25 - 62(H)  ALT 0 - 32 IU/L 51(H) - 100(H)      Component Value Date/Time   RBC 4.84 01/29/2021 0851   MCV 88.6 01/29/2021 0851   MCV 85 01/08/2021 1034   MCV 77.2 (L) 06/01/2014 1017   MCH 27.5 01/29/2021 0851   MCHC 31.0 01/29/2021 0851   RDW 11.9 01/29/2021 0851   RDW 11.3 (L) 01/08/2021 1034   RDW 17.0 (H) 06/01/2014  1017   LYMPHSABS 2.4 01/29/2021 0851   LYMPHSABS 3.0 01/08/2021 1034   LYMPHSABS 3.0 06/01/2014 1017   MONOABS 0.7 01/29/2021 0851   MONOABS 0.6 06/01/2014 1017   EOSABS 0.5 01/29/2021 0851   EOSABS 0.4 01/08/2021 1034   BASOSABS 0.1 01/29/2021 0851   BASOSABS 0.1 01/08/2021 1034   BASOSABS 0.1 06/01/2014 1017    DIAGNOSTIC IMAGING:  I have independently reviewed the scans and discussed with the patient. DG Chest 2 View  Result Date: 01/30/2021 CLINICAL DATA:  Lower rib pain for the past year.  No known injury. EXAM: CHEST - 2 VIEW COMPARISON:  10/23/2013 FINDINGS: Grossly unchanged cardiac  silhouette and mediastinal contours with atherosclerotic plaque within the thoracic aorta. Thoracic aorta appears mildly tortuous. The lungs remain hyperexpanded with flattening of the diaphragms and mild eventration of the right hemidiaphragm. Mild thickening the pulmonary interstitium, unchanged. No pleural effusion or pneumothorax. No evidence of edema. No acute osseous abnormalities. Mild-to-moderate scoliotic curvature of the thoracolumbar spine with associated multilevel DDD, incompletely evaluated. Post lumbar paraspinal fusion, incompletely evaluated. Post cholecystectomy. IMPRESSION: Similar findings of lung hyperexpansion and chronic bronchitic change without superimposed acute cardiopulmonary disease. Electronically Signed   By: Sandi Mariscal M.D.   On: 01/30/2021 09:13   DG Abd 1 View  Result Date: 01/30/2021 CLINICAL DATA:  Upper abdominal pain. EXAM: ABDOMEN - 1 VIEW COMPARISON:  None. FINDINGS: Large colonic stool burden without evidence of enteric obstruction. No pneumoperitoneum, pneumatosis or portal venous gas. No definitive abnormal intra-abdominal calcifications given overlying colonic stool burden. Post cholecystectomy. Post lumbar paraspinal fusion intervertebral disc space replacement, incompletely evaluated. Moderate scoliotic curvature of the thoracolumbar spine with associated  moderate to severe multilevel DDD, incompletely evaluated. IMPRESSION: Large colonic stool burden without evidence of enteric obstruction. Electronically Signed   By: Sandi Mariscal M.D.   On: 01/30/2021 09:12     ASSESSMENT:  1.  Leukocytosis, neutrophil predominant: -Review of labs shows that the patient has had re-mittent leukocytosis over the past decade,ranging from normal up to 14.2.  -Most recent WBC (01/08/2021) was 14.0 with 9.7 neutrophils; normal hemoglobin and platelets -Admits to chronic fatigue and chronic hyperhydrosis (including night sweats); has lost 8 pounds over the past 3 months secondary to decreased appetitite -CT abdomen and pelvis in 07/17/2019 showed normal spleen and stable right adrenal adenoma -She is a lifelong nonsmoker no history of rheumatologic or autoimmune diseases; she is not on steroids  2.  Other history: -Lifelong nonsmoker.  No alcohol or illicit drug use -She has a history of uterine cancer in her thirties, and had a total hysterectomy/bilateral oophorectomy. She did not require chemo or radiation. -Patient's mother died secondary to aplastic anemia at age 31. The patient's two brothers both died from colon cancer. (The patient is up to date on her colonoscopies per her report).  -She is a retired from work as a Network engineer. She denies any chemical exposures. -PMH:  Diabetes, glaucoma, right eye blindness, chronic back pain, osteoarthritis, and hypertension   PLAN:  1.  JAK2 V617F and BCR/ABL negative neutrophilic leukocytosis: -We reviewed results from 01/29/2021.  Repeat CBC showed normalized white count with normal differential. - Myeloproliferative work-up for JAK2 V617F mutation and BCR/ABL was negative.  ANA and rheumatoid factor was normal.  ESR and CRP was also normal. - Differential diagnosis includes steroid-induced leukocytosis as she frequently gets injections for her back pain. - Recommend follow-up in 6 months with repeat CBC.  Will  consider further work-up only if there is any significant change in her leukocytosis. - We have also reviewed her cortisol and ACTH levels which are within normal limits, done due to history of adrenal adenoma.  Orders placed this encounter:  Orders Placed This Encounter  Procedures  . CBC with Differential/Platelet  . Lactate dehydrogenase     Derek Jack, MD [place] [phone]   I, Milinda Antis, am acting as a scribe for Dr. Sanda Linger.  I, Derek Jack MD, have reviewed the above documentation for accuracy and completeness, and I agree with the above.

## 2021-02-27 ENCOUNTER — Other Ambulatory Visit: Payer: Self-pay | Admitting: Medical

## 2021-02-27 DIAGNOSIS — E118 Type 2 diabetes mellitus with unspecified complications: Secondary | ICD-10-CM

## 2021-03-06 ENCOUNTER — Other Ambulatory Visit: Payer: Self-pay | Admitting: Medical

## 2021-03-11 ENCOUNTER — Ambulatory Visit: Payer: Medicare Other | Admitting: Podiatry

## 2021-03-26 DIAGNOSIS — L570 Actinic keratosis: Secondary | ICD-10-CM | POA: Diagnosis not present

## 2021-03-27 DIAGNOSIS — M47812 Spondylosis without myelopathy or radiculopathy, cervical region: Secondary | ICD-10-CM | POA: Diagnosis not present

## 2021-03-27 DIAGNOSIS — M7918 Myalgia, other site: Secondary | ICD-10-CM | POA: Diagnosis not present

## 2021-03-27 DIAGNOSIS — M5416 Radiculopathy, lumbar region: Secondary | ICD-10-CM | POA: Diagnosis not present

## 2021-03-27 DIAGNOSIS — M961 Postlaminectomy syndrome, not elsewhere classified: Secondary | ICD-10-CM | POA: Diagnosis not present

## 2021-03-28 DIAGNOSIS — U071 COVID-19: Secondary | ICD-10-CM | POA: Diagnosis not present

## 2021-04-14 ENCOUNTER — Other Ambulatory Visit: Payer: Self-pay | Admitting: Medical

## 2021-05-19 ENCOUNTER — Ambulatory Visit: Payer: Medicare Other | Admitting: Podiatry

## 2021-05-20 DIAGNOSIS — H353131 Nonexudative age-related macular degeneration, bilateral, early dry stage: Secondary | ICD-10-CM | POA: Diagnosis not present

## 2021-05-20 DIAGNOSIS — H401133 Primary open-angle glaucoma, bilateral, severe stage: Secondary | ICD-10-CM | POA: Diagnosis not present

## 2021-05-20 LAB — HM DIABETES EYE EXAM

## 2021-05-22 ENCOUNTER — Ambulatory Visit (INDEPENDENT_AMBULATORY_CARE_PROVIDER_SITE_OTHER): Payer: 59 | Admitting: Medical

## 2021-05-22 ENCOUNTER — Other Ambulatory Visit: Payer: Self-pay

## 2021-05-22 VITALS — BP 124/80 | HR 95 | Ht 65.0 in | Wt 197.4 lb

## 2021-05-22 DIAGNOSIS — M4316 Spondylolisthesis, lumbar region: Secondary | ICD-10-CM

## 2021-05-22 DIAGNOSIS — E1142 Type 2 diabetes mellitus with diabetic polyneuropathy: Secondary | ICD-10-CM | POA: Diagnosis not present

## 2021-05-22 DIAGNOSIS — L84 Corns and callosities: Secondary | ICD-10-CM

## 2021-05-22 DIAGNOSIS — Z8616 Personal history of COVID-19: Secondary | ICD-10-CM

## 2021-05-22 DIAGNOSIS — E1169 Type 2 diabetes mellitus with other specified complication: Secondary | ICD-10-CM

## 2021-05-22 DIAGNOSIS — I10 Essential (primary) hypertension: Secondary | ICD-10-CM

## 2021-05-22 DIAGNOSIS — R0989 Other specified symptoms and signs involving the circulatory and respiratory systems: Secondary | ICD-10-CM

## 2021-05-22 DIAGNOSIS — Z0000 Encounter for general adult medical examination without abnormal findings: Secondary | ICD-10-CM

## 2021-05-22 DIAGNOSIS — I700 Atherosclerosis of aorta: Secondary | ICD-10-CM

## 2021-05-22 DIAGNOSIS — M2041 Other hammer toe(s) (acquired), right foot: Secondary | ICD-10-CM

## 2021-05-22 DIAGNOSIS — L299 Pruritus, unspecified: Secondary | ICD-10-CM

## 2021-05-22 DIAGNOSIS — M2141 Flat foot [pes planus] (acquired), right foot: Secondary | ICD-10-CM | POA: Diagnosis not present

## 2021-05-22 DIAGNOSIS — Z780 Asymptomatic menopausal state: Secondary | ICD-10-CM

## 2021-05-22 DIAGNOSIS — Z9889 Other specified postprocedural states: Secondary | ICD-10-CM

## 2021-05-22 DIAGNOSIS — D638 Anemia in other chronic diseases classified elsewhere: Secondary | ICD-10-CM

## 2021-05-22 DIAGNOSIS — Z8542 Personal history of malignant neoplasm of other parts of uterus: Secondary | ICD-10-CM

## 2021-05-22 DIAGNOSIS — M2142 Flat foot [pes planus] (acquired), left foot: Secondary | ICD-10-CM

## 2021-05-22 DIAGNOSIS — R053 Chronic cough: Secondary | ICD-10-CM

## 2021-05-22 DIAGNOSIS — E2839 Other primary ovarian failure: Secondary | ICD-10-CM

## 2021-05-22 DIAGNOSIS — L749 Eccrine sweat disorder, unspecified: Secondary | ICD-10-CM

## 2021-05-22 DIAGNOSIS — K219 Gastro-esophageal reflux disease without esophagitis: Secondary | ICD-10-CM

## 2021-05-22 DIAGNOSIS — E559 Vitamin D deficiency, unspecified: Secondary | ICD-10-CM

## 2021-05-22 DIAGNOSIS — M961 Postlaminectomy syndrome, not elsewhere classified: Secondary | ICD-10-CM

## 2021-05-22 DIAGNOSIS — M1990 Unspecified osteoarthritis, unspecified site: Secondary | ICD-10-CM

## 2021-05-22 DIAGNOSIS — K449 Diaphragmatic hernia without obstruction or gangrene: Secondary | ICD-10-CM

## 2021-05-22 DIAGNOSIS — G4761 Periodic limb movement disorder: Secondary | ICD-10-CM | POA: Diagnosis not present

## 2021-05-22 DIAGNOSIS — Z7189 Other specified counseling: Secondary | ICD-10-CM

## 2021-05-22 DIAGNOSIS — H409 Unspecified glaucoma: Secondary | ICD-10-CM

## 2021-05-22 DIAGNOSIS — N8110 Cystocele, unspecified: Secondary | ICD-10-CM

## 2021-05-22 DIAGNOSIS — D72829 Elevated white blood cell count, unspecified: Secondary | ICD-10-CM

## 2021-05-22 DIAGNOSIS — Z8639 Personal history of other endocrine, nutritional and metabolic disease: Secondary | ICD-10-CM

## 2021-05-22 DIAGNOSIS — K5903 Drug induced constipation: Secondary | ICD-10-CM

## 2021-05-22 DIAGNOSIS — R258 Other abnormal involuntary movements: Secondary | ICD-10-CM

## 2021-05-22 DIAGNOSIS — M8580 Other specified disorders of bone density and structure, unspecified site: Secondary | ICD-10-CM | POA: Insufficient documentation

## 2021-05-22 DIAGNOSIS — R5382 Chronic fatigue, unspecified: Secondary | ICD-10-CM

## 2021-05-22 DIAGNOSIS — M2042 Other hammer toe(s) (acquired), left foot: Secondary | ICD-10-CM

## 2021-05-22 DIAGNOSIS — R195 Other fecal abnormalities: Secondary | ICD-10-CM

## 2021-05-22 DIAGNOSIS — K649 Unspecified hemorrhoids: Secondary | ICD-10-CM

## 2021-05-22 DIAGNOSIS — G8929 Other chronic pain: Secondary | ICD-10-CM

## 2021-05-22 DIAGNOSIS — E118 Type 2 diabetes mellitus with unspecified complications: Secondary | ICD-10-CM | POA: Diagnosis not present

## 2021-05-22 DIAGNOSIS — R32 Unspecified urinary incontinence: Secondary | ICD-10-CM

## 2021-05-22 DIAGNOSIS — M62838 Other muscle spasm: Secondary | ICD-10-CM

## 2021-05-22 DIAGNOSIS — M5442 Lumbago with sciatica, left side: Secondary | ICD-10-CM

## 2021-05-22 DIAGNOSIS — M791 Myalgia, unspecified site: Secondary | ICD-10-CM

## 2021-05-22 DIAGNOSIS — Z7185 Encounter for immunization safety counseling: Secondary | ICD-10-CM

## 2021-05-22 DIAGNOSIS — T402X5A Adverse effect of other opioids, initial encounter: Secondary | ICD-10-CM

## 2021-05-22 DIAGNOSIS — R0789 Other chest pain: Secondary | ICD-10-CM

## 2021-05-22 DIAGNOSIS — E785 Hyperlipidemia, unspecified: Secondary | ICD-10-CM

## 2021-05-22 NOTE — Progress Notes (Addendum)
Subjective:    Robyn Hill is a 75 y.o. female who presents for Preventative Services visit and chronic medical problems/med check visit.    Primary Care Provider Doroteo Nickolson, Camelia Eng, PA-C here for primary care  Current Health Care Team: Dentist, Dr. Joya Gaskins Dr. Sharyne Peach, [facility] Dr. Lenord Carbo, neurosurgery/pain management Dr. Minus Breeding, cardiology Dr. Erline Levine, Simeon Craft, PA, neurosurgery Dr. Derek Jack, hematology oncology Dr. Jerrell Belfast, ENT Dr. Sandford Craze, dermatology Dr. Servando Salina, gynecology Dr. Bjorn Loser, urology Dr. Acquanetta Sit, podiatry   Medical Services you may have received from other than [facility] providers in the past year (date may be approximate) Neurosurgery, dermatology, eye  Exercise Current exercise habits:  none    Nutrition/Diet Current diet: well balanced  Depression Screen Depression screen [facility] 2/9 05/22/2021  Decreased Interest 0  Down, Depressed, Hopeless 0  PHQ - 2 Score 0  Some recent data might be hidden    Activities of Daily Living Screen/Functional Status Survey Is the patient deaf or have difficulty hearing?: No Does the patient have difficulty seeing, even when wearing glasses/contacts?: Yes (sees eye doctor) Does the patient have difficulty concentrating, remembering, or making decisions?: No Does the patient have difficulty walking or climbing stairs?: Yes Does the patient have difficulty dressing or bathing?: No Does the patient have difficulty doing errands alone such as visiting a doctor's office or shopping?: No  Can patient draw a clock face showing 3:15 oclock, yes  Fall Risk Screen Fall Risk  05/22/2021 11/16/2019 03/31/2018 09/23/2017 02/19/2016  Falls in the past year? 0 0 No Yes No  Comment - - - Emmi Telephone Survey: data to providers prior to load -  Number falls in past yr: 0 - - 2 or more -  Comment - - - Emmi Telephone Survey Actual Response = 2 -  Injury  with Fall? 0 - - No -  Risk for fall due to : No Fall Risks - - - -  Follow up Falls evaluation completed - - - -    Gait Assessment: Normal gait observed yes  Advanced directives Does patient have a [place]? Yes Does patient have a Living Will? Yes  Past Medical History:  Diagnosis Date   Adrenal mass, right ([place])    Allergy    Anemia    Asthma    "as a teenager"   Blind right eye    since childhood   Blood transfusion 1970   Chronic back pain    Chronic diarrhea    Colestipol therapy   Depression    Diverticulitis    Diverticulosis    Fatty liver    Female bladder prolapse    GERD (gastroesophageal reflux disease)    Glaucoma    H/O bone density study 11/29/2014   normal study although mild decreased in density from prior study   H/O hiatal hernia    Hemorrhoids    History of kidney stones    History of uterine cancer    s/p hysterectomy   Hyperlipidemia    Hypertension    Migraine    hx of migraines in past    Neuromuscular disorder (HCC)    diabetic neuropahthy   Numbness and tingling of both legs    Osteoarthritis    Rheumatic fever    age 50   Type II diabetes mellitus ([place])    Urinary incontinence    Vitamin D deficiency     Past Surgical History:  Procedure Laterality Date   ABDOMINAL HYSTERECTOMY  1980   no further pap smears needed   ANTERIOR AND POSTERIOR REPAIR N/A 01/29/2015   Procedure: CYSTOCELE AND RECTOCELE ;  Surgeon: Bjorn Loser, MD;  Location: [facility];  Service: Urology;  Laterality: N/A;   BACK SURGERY     BREAST BIOPSY  08/1997   right   CARDIAC CATHETERIZATION  11/2006   CHOLECYSTECTOMY     COLONOSCOPY  2014   polyps, external hemorrhoids   CYSTOSCOPY N/A 01/29/2015   Procedure: CYSTOSCOPY;  Surgeon: Bjorn Loser, MD;  Location: [facility];  Service: Urology;  Laterality: N/A;   DILATION AND CURETTAGE OF UTERUS  1980   EDSI for back pain  last 01/2009   multiple x 5   EYE SURGERY     Bil Blind R eye    KIDNEY STONE SURGERY  1979   LIVER BIOPSY  04/1999   minor laceration repair     right eye   POSTERIOR FUSION LUMBAR SPINE  04/05/12   PULSE GENERATOR IMPLANT Right 03/12/2016   Procedure: Right Reposition of implantable pulse generator;  Surgeon: Erline Levine, MD;  Location: [place] [facility];  Service: Neurosurgery;  Laterality: Right;  Right Reposition of implantable pulse generator   SPINAL CORD STIMULATOR INSERTION N/A 09/06/2015   Procedure: LUMBAR SPINAL CORD STIMULATOR INSERTION;  Surgeon: Clydell Hakim, MD;  Location: [place] [facility];  Service: Neurosurgery;  Laterality: N/A;  Spinal Cord Stimulator placement   SPINAL CORD STIMULATOR INSERTION N/A 10/22/2015   Procedure: Laminectomy for spinal cord stimulator paddle lead and implantable generator placement;  Surgeon: Erline Levine, MD;  Location: [place] [facility];  Service: Neurosurgery;  Laterality: N/A;  Laminectomy for spinal cord stimulator paddle lead and implantable generator placement   steroid injections of hip  last 02/2009   multiple x [place]   bilaterally; "for glaucoma"   UPPER GI ENDOSCOPY  05/21/14   food, retained in the body of the stomach   VAGINAL PROLAPSE REPAIR N/A 01/29/2015   Procedure:  VAULT PROLAPSE WITH GRAFT ;  Surgeon: Bjorn Loser, MD;  Location[facility];  Service: Urology;  Laterality: N/A;    Social History   Socioeconomic History   Marital status: Married    Spouse name: Lake Bells    Number of children: 3   Years of education: Not on file   Highest education level: Some college, no degree  Occupational History   Occupation: Retired   Tobacco Use   Smoking status: Never   Smokeless tobacco: Never  Vaping Use   Vaping Use: Never used  Substance and Sexual Activity   Alcohol use: Yes    Comment: rare   Drug use: No   Sexual activity: Never    Comment: married  Other Topics Concern   Not on file  Social History Narrative   Three children and two  step.     Right handed   Caffeine rarely    Lives at home with husband    Social Determinants of Health   Financial Resource Strain: Low Risk    Difficulty of Paying Living Expenses: Not hard at all  Food Insecurity: No Food Insecurity   Worried About Charity fundraiser in the Last Year: Never true   Arboriculturist in the Last Year: Never true  Transportation Needs: No Transportation Needs   Lack of Transportation (Medical): No   Lack of Transportation (Non-Medical): No  Physical Activity: Inactive   Days of Exercise per Week: 0 days   Minutes of Exercise per Session: 0 min  Stress: No Stress Concern Present   Feeling of Stress : Not at all  Social Connections: Moderately Integrated   Frequency of Communication with Friends and Family: More than three times a week   Frequency of Social Gatherings with Friends and Family: More than three times a week   Attends Religious Services: More than 4 times per year   Active Member of [employer] or Organizations: No   Attends Music therapist: Never   Marital Status: Married  Human resources officer Violence: Not At Risk   Fear of Current or Ex-Partner: No   Emotionally Abused: No   Physically Abused: No   Sexually Abused: No    Family History  Problem Relation Age of Onset   Heart attack Brother 74   Hypertension Other    Diabetes Other    Cancer Mother 69   Aplastic anemia Mother 65   Heart attack Brother 28   Colon cancer Brother    Heart attack Sister    Heart attack Brother 63   Anesthesia problems Neg Hx      Current Outpatient Medications:    Accu-Chek FastClix Lancets MISC, USE 1 STRIP TO CHECK GLUCOSE TWICE DAILY, Disp: 102 each, Rfl: 0   atorvastatin (LIPITOR) 40 MG tablet, Take 1 tablet (40 mg total) by mouth at bedtime., Disp: 90 tablet, Rfl: 3   B-D UF III MINI PEN NEEDLES 31G X 5 MM MISC, USE TO INJECT TOUJEO AS DIRECTED, Disp: 100 each, Rfl: 0   Blood Glucose Calibration (ACCU-CHEK INSTANT CONTROL) LIQD,  Use for controls for accu-chek activa meter, Disp: 1 each, Rfl: 0   Blood Glucose Monitoring Suppl (ACCU-CHEK GUIDE ME) w/Device KIT, 1 Device by Does not apply route 2 (two) times daily at 8 am and 10 pm., Disp: 1 kit, Rfl: 0   brimonidine (ALPHAGAN) 0.2 % ophthalmic solution, Place 1 drop into the left eye 2 times daily., Disp: , Rfl:    cetirizine (ZYRTEC) 10 MG tablet, Take 10 mg by mouth daily., Disp: , Rfl:    dorzolamide-timolol (COSOPT) 22.3-6.8 MG/ML ophthalmic solution, Place 1 drop into both eyes 2 (two) times daily., Disp: , Rfl:    ferrous gluconate (FERGON) 324 MG tablet, Take 1 tablet (324 mg total) by mouth daily with breakfast., Disp: 90 tablet, Rfl: 1   glucose blood (ACCU-CHEK GUIDE) test strip, USE 1 CLEAN STRIP TO CHECK BLOOD GLUCOSE TWICE DAILY, Disp: 150 each, Rfl: 0   HYDROmorphone (DILAUDID) 4 MG tablet, Take 4 mg by mouth every 6 (six) hours as needed., Disp: , Rfl:    Lancets MISC, daily., Disp: , Rfl:    latanoprost (XALATAN) 0.005 % ophthalmic solution, Place 1 drop into the right eye at bedtime., Disp: , Rfl:    levETIRAcetam (KEPPRA) 250 MG tablet, Take 250 mg by mouth 2 (two) times daily., Disp: , Rfl:    nortriptyline (PAMELOR) 10 MG capsule, TAKE 1 TO 2 CAPSULE BY ORAL ROUTE EVERY DAY AT BEDTIME (90 day supply ), Disp: , Rfl:    PROCTO-MED HC 2.5 % rectal cream, APPLY RECTALLY TWICE DAILY, Disp: 30 g, Rfl: 0   triamcinolone (KENALOG) 0.1 %, Apply topically., Disp: , Rfl:    Cholecalciferol (VITAMIN D-3) 25 MCG (1000 UT) CAPS, Take 1 capsule (1,000 Units total) by mouth daily., Disp: 90 capsule, Rfl: 3   dexlansoprazole (DEXILANT) 60 MG capsule, Take  1 capsule (60 mg total) by mouth 2 (two) times daily., Disp: 180 capsule, Rfl: 3   DULoxetine (CYMBALTA) 60 MG capsule, Take 1 capsule (60 mg total) by mouth daily., Disp: 90 capsule, Rfl: 0   enalapril (VASOTEC) 20 MG tablet, Take 1 tablet (20 mg total) by mouth daily., Disp: 90 tablet, Rfl: 3   Exenatide ER (BYDUREON  BCISE) 2 MG/0.85ML AUIJ, INJECT CONTENTS OF 1 PEN (2MG) INTO THE SKIN ONCE A WEEK, Disp: 12 mL, Rfl: 3   hydrochlorothiazide (HYDRODIURIL) 25 MG tablet, Take 1 tablet (25 mg total) by mouth daily., Disp: 90 tablet, Rfl: 3   insulin glargine, 1 Unit Dial, (TOUJEO SOLOSTAR) 300 UNIT/ML Solostar Pen, Inject 33 Units into the skin daily., Disp: 15 mL, Rfl: 3   linaclotide (LINZESS) 145 MCG CAPS capsule, Take 1 capsule (145 mcg total) by mouth daily before breakfast., Disp: 90 capsule, Rfl: 3   metFORMIN (GLUCOPHAGE) 500 MG tablet, Take 1 tablet (500 mg total) by mouth 2 (two) times daily with a meal., Disp: 180 tablet, Rfl: 3  Allergies  Allergen Reactions   Kiwi Extract Hives   Lyrica [Pregabalin] Other (See Comments)    psychosis   Sunflowerseed Oil Hives   Acetaminophen Nausea Only   Oxycodone Hcl Nausea Only   Hydrocodone-Acetaminophen Nausea Only    "Extended release form only"   Invokana [Canagliflozin] Other (See Comments)    UTI   Neurontin [Gabapentin] Other (See Comments)    Intolerance/ineffective   Nitrofuran Derivatives Nausea Only and Other (See Comments)    stomach pain and weight loss   Tape Other (See Comments)    Pulls skin off, can use paper tape    History reviewed: allergies, current medications, past family history, past medical history, past social history, past surgical history and problem list  Chronic issues discussed: Hypertension-compliant with medications  Hyperlipidemia-compliant with medications  Chronic pain-sees neurosurgery who manages her medications  Compliant with vitamin D, iron,  Constipation-compliant with Linzess as needed   Acute issues discussed: Ongoing pains in chest wall and ribs particular on the right, ongoing joint pain and arthritis  No recent fall or injury  For the last several months she has been dealing with what she attributes to insect bites throughout arms and legs.  She has seen dermatology was given the scabies cream  and has done this at least twice.  She wonders about mites.  The sores or rashes do not seem to resolve    Objective:     Biometrics BP 124/80   Pulse 95   Ht '5\' 5"'  (1.651 m)   Wt 197 lb 6.4 oz (89.5 kg)   SpO2 97%   BMI 32.85 kg/m   Cognitive Testing Alert and oriented x3, normal recall, can perform simple calculations, displays appropriate judgment   General appearance: alert, no distress, WD/WN, white female  She gets adequate calorie intake, there is loss of muscle mass, no loss of fat beneath the skin, no generalized edema.  She has diminished functional status due to joint pains and arthritis   Other pertinent exam: Neck: supple, no lymphadenopathy, no thyromegaly, no masses Heart: RRR, normal S1, S2, no murmurs Lungs: CTA bilaterally, no wheezes, rhonchi, or rales Abdomen: +bs, soft, non tender, non distended, no masses, no hepatomegaly, no splenomegaly Extremities: no edema, no cyanosis, no clubbing Pulses: 2+ symmetric, upper and 1+ bilateral lower extremities, normal cap refill Psychiatric: normal affect, behavior normal, pleasant  Skin: Several scattered reddish lesions round approximately 2-4 mm diameter  scattered throughout arms and legs in various stages of healing  Diabetic Foot Exam - Simple   Simple Foot Form Diabetic Foot exam was performed with the following findings: Yes 05/23/2021  7:59 AM  Visual Inspection See comments: Yes Sensation Testing See comments: Yes Pulse Check See comments: Yes Comments 1+ pulses, monofilament testing decreased and scattered pattern throughout the toes but sensation felt on the volar foot.  Flat feet, hammertoe deformity noted bilaterally of several toes, hypertrophic toenails noted throughout      Assessment:   Encounter Diagnoses  Name Primary?   Encounter for health maintenance examination in adult Yes   Diabetes mellitus with complication ([place])    Diabetic polyneuropathy associated with type 2 diabetes  mellitus ([place])    Medicare annual wellness visit, subsequent    Periodic limb movement disorder    Nocturnal leg movements    Muscle spasm    Post laminectomy syndrome    Post-menopausal    Pre-ulcerative calluses    Prolapse of female bladder, acquired    Sweating abnormality    Vaccine counseling    Vitamin D deficiency    Urinary incontinence, unspecified type    Spondylolisthesis of lumbar region    Estrogen deficiency    Essential hypertension, benign    Chronic fatigue    Gastroesophageal reflux disease without esophagitis    Glaucoma, unspecified glaucoma type, unspecified laterality    Hammer toes of both feet    Hemorrhoids, unspecified hemorrhoid type    Hiatal hernia    History of back surgery    History of COVID-19    History of thyroid disease    History of uterine cancer    Loose stools    Leukocytosis, unspecified type    Anemia of chronic disease    Aortic atherosclerosis (HCC)    Arthritis    Chest wall pain    Chronic bilateral low back pain with left-sided sciatica    Chronic cough    Other chronic pain    Constipation due to opioid therapy    Decreased pedal pulses    Gastroesophageal reflux disease, unspecified whether esophagitis present    Osteopenia, unspecified location    Advanced directives, counseling/discussion    Pruritic condition    Myalgia    Dyslipidemia associated with type 2 diabetes mellitus (HCC)    Pes planus of both feet      Plan:   A preventative services visit was completed today.  During the course of the visit today, we discussed and counseled about appropriate screening and preventive services.  A health risk assessment was established today that included a review of current medications, allergies, social history, family history, medical and preventative health history, biometrics, and preventative screenings to identify potential safety concerns or impairments.   This visit was a preventative care visit, also known as  wellness visit or routine physical.   Topics typically include healthy lifestyle, diet, exercise, preventative care, vaccinations, sick and well care, proper use of emergency dept and after hours care, as well as other concerns.     Recommendations: Continue to return yearly for your annual wellness and preventative care visits.  This gives [place] a chance to discuss healthy lifestyle, exercise, vaccinations, review your chart record, and perform screenings where appropriate.  I recommend you see your eye doctor yearly for routine vision care.  I recommend you see your dentist yearly for routine dental care including hygiene visits twice yearly.   Vaccination recommendations were reviewed Immunization History  Administered Date(s) Administered   Fluad Quad(high Dose 65+) 07/17/2019, 07/22/2020   Hepatitis B, adult 06/23/2013, 07/24/2013, 04/05/2014   Influenza Split 08/22/2007, 07/22/2011, 08/12/2012   Influenza, High Dose Seasonal PF 07/26/2014, 08/06/2015, 08/03/2016, 08/12/2017, 08/05/2018   Influenza,inj,Quad PF,6+ Mos 07/24/2013   Pneumococcal Conjugate-13 04/05/2014   Pneumococcal Polysaccharide-23 08/25/2007, 10/20/2007, 04/04/2008, 08/08/2008, 03/31/2018   Tdap 10/19/2005, 08/22/2007   Zoster, Live 10/20/2007, 04/04/2008    I recommend a yearly flu shot in the fall  Shingles vaccine:  I recommend you have a shingles vaccine to help prevent shingles or herpes zoster outbreak.   Please call your insurer to inquire about coverage for the Shingrix vaccine given in 2 doses.   Some insurers cover this vaccine after age 70, some cover this after age 79.  If your insurer covers this, then call to schedule appointment to have this vaccine here.  I recommend an updated tetanus booster through your local pharmacy or the health department    Screening for cancer: Colon cancer screening: Consider cologuard screening.  We generally stop doing colon cancer screening after age 28 per USPSTF  guidelines.  Breast cancer screening is generally done up to age 76 for most women per USPSTF guidelines.  Skin cancer screening: Check your skin regularly for new changes, growing lesions, or other lesions of concern Come in for evaluation if you have skin lesions of concern.  Lung cancer screening: If you have a greater than 20 pack year history of tobacco use, then you may qualify for lung cancer screening with a chest CT scan.   Please call your insurance company to inquire about coverage for this test.  We currently don't have screenings for other cancers besides breast, cervical, colon, and lung cancers.  If you have a strong family history of cancer or have other cancer screening concerns, please let me know.    Bone health: Get at least 150 minutes of aerobic exercise weekly Get weight bearing exercise at least once weekly Bone density test:  A bone density test is an imaging test that uses a type of X-ray to measure the amount of calcium and other minerals in your bones. The test may be used to diagnose or screen you for a condition that causes weak or thin bones (osteoporosis), predict your risk for a broken bone (fracture), or determine how well your osteoporosis treatment is working. The bone density test is recommended for females 33 and older, or females or males <10 if certain risk factors such as thyroid disease, long term use of steroids such as for asthma or rheumatological issues, vitamin D deficiency, estrogen deficiency, family history of osteoporosis, self or family history of fragility fracture in first degree relative.  Bone density test from 01/2021 reviewed Your bone density scan from April 2022 shows low bone density or osteopenia.   Heart health: Get at least 150 minutes of aerobic exercise weekly Limit alcohol It is important to maintain a healthy blood pressure and healthy cholesterol numbers  Heart disease screening: Screening for heart disease includes  screening for blood pressure, fasting lipids, glucose/diabetes screening, BMI height to weight ratio, reviewed of smoking status, physical activity, and diet.    Goals include blood pressure 120/80 or less, maintaining a healthy lipid/cholesterol profile, preventing diabetes or keeping diabetes numbers under good control, not smoking or using tobacco products, exercising most days per week or at least 150 minutes per week of exercise, and eating healthy variety of fruits and vegetables, healthy oils, and avoiding unhealthy food  choices like fried food, fast food, high sugar and high cholesterol foods.     Medical care options: I recommend you continue to seek care here first for routine care.  We try really hard to have available appointments Monday through Friday daytime hours for sick visits, acute visits, and physicals.  Urgent care should be used for after hours and weekends for significant issues that cannot wait till the next day.  The emergency department should be used for significant potentially life-threatening emergencies.  The emergency department is expensive, can often have long wait times for less significant concerns, so try to utilize primary care, urgent care, or telemedicine when possible to avoid unnecessary trips to the emergency department.  Virtual visits and telemedicine have been introduced since the pandemic started in 2020, and can be convenient ways to receive medical care.  We offer virtual appointments as well to assist you in a variety of options to seek medical care.    Separate significant issues discussed: Diabetes - continue bydureon weekly, Toujeo 30u daily, metformin 534m BID, glucometer testing ,food checks daily  Diabetic neuropathy - continue Notriptylein 147mdaily, Cymbalta 6081maily  Limb movement disorder, nocturnal leg movements  Postmenopausal and estrogen deficiency 1000u daily  Vitamin D deficiency - c/t supplement   Hypertension - continue HCTZ  60m71mily, enalapril 20mg52mly  Dyslipidemia associated with diabetes - c/t Atorvastatin 40mg 48my  History of thyroid disease - not on medication, labs today  Leukocytosis - I reviewed recent hematology notes from 02/2021 visit - per their notes:  JAK2 V617F and BCR/ABL negative neutrophilic leukocytosis: -We reviewed results from 01/29/2021.  Repeat CBC showed normalized white count with normal differential. - Myeloproliferative work-up for JAK2 V617F mutation and BCR/ABL was negative.  ANA and rheumatoid factor was normal.  ESR and CRP was also normal. - Differential diagnosis includes steroid-induced leukocytosis as she frequently gets injections for her back pain. - Recommend follow-up in 6 months with repeat CBC.  Will consider further work-up only if there is any significant change in her leukocytosis. - We have also reviewed her cortisol and ACTH levels which are within normal limits, done due to history of adrenal adenoma   Chronic fatigue - multifactoral, continue Cymbalta for mood, pain, fatigue  GERD - c/t dexilant as she has worse symptoms off medication  Prolapse of bladder ,  Urinary incontinence - sees urology  Constipation due to opioid therapy - c/t Linzess prn  Chronic pain , Postlaminectomy syndrome - managed by neurosurgery and pain management   Skin lesions - I sent an electronic E consult for specialty provider review through [facility] their behalf regarding findings.  I will get back in touch with patient pending recommendations from the [facility].   I will work on paperwork for diabetes shoes given decreased pulses, neuropathy, foot deformity.     Advanced Directives: I recommend you consider completing a Health[place]itaiving Will.  have copies made for our office, for you and for anybody you feel should have them in safe keeping.     Wanette Cheriaeen today for awv fasting .  Diagnoses and all orders for this  visit:  Encounter for health maintenance examination in adult  Diabetes mellitus with complication (HCC) -[place]  Hemoglobin A1c -     Microalbumin/Creatinine Ratio, Urine -     VAS US ABI[place]ITH/WO TBI; Future  Diabetic polyneuropathy associated with type 2 diabetes mellitus (HCC)[place]  Medicare annual wellness visit, subsequent  Periodic limb movement disorder  Nocturnal leg movements  Muscle spasm  Post laminectomy syndrome  Post-menopausal  Pre-ulcerative calluses  Prolapse of female bladder, acquired  Sweating abnormality  Vaccine counseling  Vitamin D deficiency  Urinary incontinence, unspecified type  Spondylolisthesis of lumbar region  Estrogen deficiency  Essential hypertension, benign -     Microalbumin/Creatinine Ratio, Urine  Chronic fatigue  Gastroesophageal reflux disease without esophagitis  Glaucoma, unspecified glaucoma type, unspecified laterality  Hammer toes of both feet  Hemorrhoids, unspecified hemorrhoid type  Hiatal hernia  History of back surgery  History of COVID-19  History of thyroid disease -     TSH + free T4  History of uterine cancer  Loose stools  Leukocytosis, unspecified type  Anemia of chronic disease  Aortic atherosclerosis (HCC)  Arthritis  Chest wall pain -     CK -     Sedimentation rate  Chronic bilateral low back pain with left-sided sciatica  Chronic cough  Other chronic pain  Constipation due to opioid therapy  Decreased pedal pulses -     VAS [place] ABI WITH/WO TBI; Future  Gastroesophageal reflux disease, unspecified whether esophagitis present  Osteopenia, unspecified location  Advanced directives, counseling/discussion  Pruritic condition -     CK -     Sedimentation rate  Myalgia -     CK -     Sedimentation rate  Dyslipidemia associated with type 2 diabetes mellitus (HCC) -     Lipid panel  Pes planus of both feet        Medicare Attestation A preventative services visit  was completed today.  During the course of the visit the patient was educated and counseled about appropriate screening and preventive services.  A health risk assessment was established with the patient that included a review of current medications, allergies, social history, family history, medical and preventative health history, biometrics, and preventative screenings to identify potential safety concerns or impairments.  A personalized plan was printed today for the patient's records and use.   Personalized health advice and education was given today to reduce health risks and promote self management and wellness.  Information regarding end of life planning was discussed today.  Dorothea Ogle, PA-C   05/23/2021

## 2021-05-23 ENCOUNTER — Telehealth: Payer: Self-pay | Admitting: Medical

## 2021-05-23 ENCOUNTER — Encounter: Payer: Self-pay | Admitting: Internal Medicine

## 2021-05-23 ENCOUNTER — Other Ambulatory Visit: Payer: Self-pay | Admitting: Medical

## 2021-05-23 DIAGNOSIS — E118 Type 2 diabetes mellitus with unspecified complications: Secondary | ICD-10-CM

## 2021-05-23 DIAGNOSIS — Z0000 Encounter for general adult medical examination without abnormal findings: Secondary | ICD-10-CM | POA: Insufficient documentation

## 2021-05-23 DIAGNOSIS — E119 Type 2 diabetes mellitus without complications: Secondary | ICD-10-CM | POA: Insufficient documentation

## 2021-05-23 DIAGNOSIS — E1169 Type 2 diabetes mellitus with other specified complication: Secondary | ICD-10-CM | POA: Insufficient documentation

## 2021-05-23 DIAGNOSIS — M2140 Flat foot [pes planus] (acquired), unspecified foot: Secondary | ICD-10-CM | POA: Insufficient documentation

## 2021-05-23 LAB — MICROALBUMIN / CREATININE URINE RATIO
Creatinine, Urine: 79.7 mg/dL
Microalb/Creat Ratio: 9 mg/g creat (ref 0–29)
Microalbumin, Urine: 7.4 ug/mL

## 2021-05-23 LAB — CK: Total CK: 42 U/L (ref 32–182)

## 2021-05-23 LAB — TSH+FREE T4
Free T4: 1.25 ng/dL (ref 0.82–1.77)
TSH: 1.77 u[IU]/mL (ref 0.450–4.500)

## 2021-05-23 LAB — LIPID PANEL
Chol/HDL Ratio: 2.3 ratio (ref 0.0–4.4)
Cholesterol, Total: 133 mg/dL (ref 100–199)
HDL: 57 mg/dL (ref >39)
LDL Chol Calc (NIH): 58 mg/dL (ref 0–99)
Triglycerides: 99 mg/dL (ref 0–149)
VLDL Cholesterol Cal: 18 mg/dL (ref 5–40)

## 2021-05-23 LAB — SEDIMENTATION RATE: Sed Rate: 3 mm/hr (ref 0–40)

## 2021-05-23 LAB — HEMOGLOBIN A1C
Est. average glucose Bld gHb Est-mCnc: 171 mg/dL
Hgb A1c MFr Bld: 7.6 % — ABNORMAL HIGH (ref 4.8–5.6)

## 2021-05-23 MED ORDER — METFORMIN HCL 500 MG PO TABS: 500.0000 mg | ORAL_TABLET | Freq: Two times a day (BID) | ORAL | 3 refills | Status: DC

## 2021-05-23 MED ORDER — ENALAPRIL MALEATE 20 MG PO TABS: 20.0000 mg | ORAL_TABLET | Freq: Every day | ORAL | 3 refills | Status: DC

## 2021-05-23 MED ORDER — DEXLANSOPRAZOLE 60 MG PO CPDR: 1.0000 | DELAYED_RELEASE_CAPSULE | Freq: Two times a day (BID) | ORAL | 3 refills | Status: DC

## 2021-05-23 MED ORDER — DULOXETINE HCL 60 MG PO CPEP: 60.0000 mg | ORAL_CAPSULE | Freq: Every day | ORAL | 0 refills | Status: DC

## 2021-05-23 MED ORDER — HYDROCHLOROTHIAZIDE 25 MG PO TABS: 25.0000 mg | ORAL_TABLET | Freq: Every day | ORAL | 3 refills | Status: DC

## 2021-05-23 MED ORDER — TOUJEO SOLOSTAR 300 UNIT/ML ~~LOC~~ SOPN: 33.0000 [IU] | PEN_INJECTOR | Freq: Every day | SUBCUTANEOUS | 3 refills | Status: DC

## 2021-05-23 MED ORDER — LINACLOTIDE 145 MCG PO CAPS: 145.0000 ug | ORAL_CAPSULE | Freq: Every day | ORAL | 3 refills | Status: DC

## 2021-05-23 MED ORDER — BYDUREON BCISE 2 MG/0.85ML ~~LOC~~ AUIJ: AUTO-INJECTOR | SUBCUTANEOUS | 3 refills | Status: DC

## 2021-05-23 MED ORDER — VITAMIN D-3 25 MCG (1000 UT) PO CAPS: 1.0000 | ORAL_CAPSULE | Freq: Every day | ORAL | 3 refills | Status: DC

## 2021-05-23 NOTE — Telephone Encounter (Signed)
Please see the prescription and form for diabetic shoes  They require MD signature so please have Dr. Redmond School signed the prescription and the other form.  Submit copy of the office note and these documents to [employer] in [place]  Please include Dr. Lanice Shirts NPI number  Please make a copy for scanning

## 2021-05-23 NOTE — Addendum Note (Signed)
Addended by: Carlena Hurl on: 05/23/2021 01:10 PM   Modules accepted: Orders

## 2021-05-23 NOTE — Patient Instructions (Signed)
This visit was a preventative care visit, also known as wellness visit or routine physical.   Topics typically include healthy lifestyle, diet, exercise, preventative care, vaccinations, sick and well care, proper use of emergency dept and after hours care, as well as other concerns.     Recommendations: Continue to return yearly for your annual wellness and preventative care visits.  This gives [place] a chance to discuss healthy lifestyle, exercise, vaccinations, review your chart record, and perform screenings where appropriate.  I recommend you see your eye doctor yearly for routine vision care.  I recommend you see your dentist yearly for routine dental care including hygiene visits twice yearly.   Vaccination recommendations were reviewed Immunization History  Administered Date(s) Administered   Fluad Quad(high Dose 65+) 07/17/2019, 07/22/2020   Hepatitis B, adult 06/23/2013, 07/24/2013, 04/05/2014   Influenza Split 08/22/2007, 07/22/2011, 08/12/2012   Influenza, High Dose Seasonal PF 07/26/2014, 08/06/2015, 08/03/2016, 08/12/2017, 08/05/2018   Influenza,inj,Quad PF,6+ Mos 07/24/2013   Pneumococcal Conjugate-13 04/05/2014   Pneumococcal Polysaccharide-23 08/25/2007, 10/20/2007, 04/04/2008, 08/08/2008, 03/31/2018   Tdap 10/19/2005, 08/22/2007   Zoster, Live 10/20/2007, 04/04/2008    I recommend a yearly flu shot in the fall  Shingles vaccine:  I recommend you have a shingles vaccine to help prevent shingles or herpes zoster outbreak.   Please call your insurer to inquire about coverage for the Shingrix vaccine given in 2 doses.   Some insurers cover this vaccine after age 33, some cover this after age 77.  If your insurer covers this, then call to schedule appointment to have this vaccine here.  I recommend an updated tetanus booster through your local pharmacy or the health department    Screening for cancer: Colon cancer screening: Consider cologuard screening.  We generally  stop doing colon cancer screening after age 32 per USPSTF guidelines.  Breast cancer screening is generally done up to age 39 for most women per USPSTF guidelines.  Skin cancer screening: Check your skin regularly for new changes, growing lesions, or other lesions of concern Come in for evaluation if you have skin lesions of concern.  Lung cancer screening: If you have a greater than 20 pack year history of tobacco use, then you may qualify for lung cancer screening with a chest CT scan.   Please call your insurance company to inquire about coverage for this test.  We currently don't have screenings for other cancers besides breast, cervical, colon, and lung cancers.  If you have a strong family history of cancer or have other cancer screening concerns, please let me know.    Bone health: Get at least 150 minutes of aerobic exercise weekly Get weight bearing exercise at least once weekly Bone density test:  A bone density test is an imaging test that uses a type of X-ray to measure the amount of calcium and other minerals in your bones. The test may be used to diagnose or screen you for a condition that causes weak or thin bones (osteoporosis), predict your risk for a broken bone (fracture), or determine how well your osteoporosis treatment is working. The bone density test is recommended for females 47 and older, or females or males <38 if certain risk factors such as thyroid disease, long term use of steroids such as for asthma or rheumatological issues, vitamin D deficiency, estrogen deficiency, family history of osteoporosis, self or family history of fragility fracture in first degree relative.  Bone density test from 01/2021 reviewed Your bone density scan from April 2022 shows  low bone density or osteopenia.   Heart health: Get at least 150 minutes of aerobic exercise weekly Limit alcohol It is important to maintain a healthy blood pressure and healthy cholesterol numbers  Heart  disease screening: Screening for heart disease includes screening for blood pressure, fasting lipids, glucose/diabetes screening, BMI height to weight ratio, reviewed of smoking status, physical activity, and diet.    Goals include blood pressure 120/80 or less, maintaining a healthy lipid/cholesterol profile, preventing diabetes or keeping diabetes numbers under good control, not smoking or using tobacco products, exercising most days per week or at least 150 minutes per week of exercise, and eating healthy variety of fruits and vegetables, healthy oils, and avoiding unhealthy food choices like fried food, fast food, high sugar and high cholesterol foods.     Medical care options: I recommend you continue to seek care here first for routine care.  We try really hard to have available appointments Monday through Friday daytime hours for sick visits, acute visits, and physicals.  Urgent care should be used for after hours and weekends for significant issues that cannot wait till the next day.  The emergency department should be used for significant potentially life-threatening emergencies.  The emergency department is expensive, can often have long wait times for less significant concerns, so try to utilize primary care, urgent care, or telemedicine when possible to avoid unnecessary trips to the emergency department.  Virtual visits and telemedicine have been introduced since the pandemic started in 2020, and can be convenient ways to receive medical care.  We offer virtual appointments as well to assist you in a variety of options to seek medical care.    Separate significant issues discussed: Diabetes - continue bydureon weekly, Toujeo 30u daily, metformin 520m BID, glucometer testing ,food checks daily  Diabetic neuropathy - continue Notriptylein 128mdaily, Cymbalta 6067maily  Limb movement disorder, nocturnal leg movements  Postmenopausal and estrogen deficiency 1000u daily  Vitamin D  deficiency - c/t supplement   Hypertension - continue HCTZ 75m30mily, enalapril 20mg72mly  Dyslipidemia associated with diabetes - c/t Atorvastatin 40mg 72my  History of thyroid disease - not on medication, labs today  Leukocytosis - I reviewed recent hematology notes from 02/2021 visit - per their notes:  JAK2 V617F and BCR/ABL negative neutrophilic leukocytosis: -We reviewed results from 01/29/2021.  Repeat CBC showed normalized white count with normal differential. - Myeloproliferative work-up for JAK2 V617F mutation and BCR/ABL was negative.  ANA and rheumatoid factor was normal.  ESR and CRP was also normal. - Differential diagnosis includes steroid-induced leukocytosis as she frequently gets injections for her back pain. - Recommend follow-up in 6 months with repeat CBC.  Will consider further work-up only if there is any significant change in her leukocytosis. - We have also reviewed her cortisol and ACTH levels which are within normal limits, done due to history of adrenal adenoma   Chronic fatigue - multifactoral, continue Cymbalta for mood, pain, fatigue  GERD - c/t dexilant as she has worse symptoms off medication  Prolapse of bladder ,  Urinary incontinence - sees urology  Constipation due to opioid therapy - c/t Linzess prn  Chronic pain , Postlaminectomy syndrome - managed by neurosurgery and pain management   Skin lesions - I sent an electronic E consult for specialty provider review through [facility] their behalf regarding findings.  I will get back in touch with patient pending recommendations from the [facility].   I will work on  paperwork for diabetes shoes.   Advanced Directives: I recommend you consider completing a [place] and Living Will.  have copies made for our office, for you and for anybody you feel should have them in safe keeping.

## 2021-05-28 NOTE — Progress Notes (Signed)
Lvm for pt to call back if she had questions . [place]

## 2021-05-30 ENCOUNTER — Encounter: Payer: Self-pay | Admitting: Internal Medicine

## 2021-06-09 ENCOUNTER — Telehealth: Payer: Self-pay

## 2021-06-09 ENCOUNTER — Other Ambulatory Visit: Payer: Self-pay | Admitting: Medical

## 2021-06-09 NOTE — Telephone Encounter (Signed)
Called pharmacy regarding Dexilant pt picked up #[phone] on 05/26/21 for $0 co pay went thru insurance no P.A. needed, pt can only get 1 month at a time thru local pharmacy.  Left message for pt

## 2021-06-10 ENCOUNTER — Other Ambulatory Visit: Payer: Self-pay | Admitting: Medical

## 2021-06-10 LAB — FERRITIN: Ferritin: 52 ng/mL (ref 15–150)

## 2021-06-10 LAB — SPECIMEN STATUS REPORT

## 2021-06-10 LAB — IRON: Iron: 78 ug/dL (ref 27–139)

## 2021-06-13 ENCOUNTER — Other Ambulatory Visit: Payer: Self-pay | Admitting: Medical

## 2021-06-16 ENCOUNTER — Telehealth: Payer: Self-pay | Admitting: Medical

## 2021-06-16 DIAGNOSIS — E118 Type 2 diabetes mellitus with unspecified complications: Secondary | ICD-10-CM

## 2021-06-16 MED ORDER — TOUJEO SOLOSTAR 300 UNIT/ML ~~LOC~~ SOPN: 33.0000 [IU] | PEN_INJECTOR | Freq: Every day | SUBCUTANEOUS | 3 refills | Status: DC

## 2021-06-16 NOTE — Telephone Encounter (Signed)
Pt called and states that she needs refills on her toujeo pens Please send to the [facility] in [place]

## 2021-06-16 NOTE — Telephone Encounter (Signed)
Refilled toujeo

## 2021-06-22 ENCOUNTER — Telehealth: Payer: Self-pay

## 2021-06-22 NOTE — Telephone Encounter (Signed)
P.A. DEXILANT BID dosing

## 2021-07-11 ENCOUNTER — Other Ambulatory Visit: Payer: Self-pay | Admitting: Medical

## 2021-07-11 DIAGNOSIS — E118 Type 2 diabetes mellitus with unspecified complications: Secondary | ICD-10-CM

## 2021-07-17 ENCOUNTER — Ambulatory Visit: Payer: 59 | Admitting: Medical

## 2021-07-17 ENCOUNTER — Other Ambulatory Visit: Payer: Self-pay

## 2021-07-17 NOTE — Progress Notes (Signed)
error 

## 2021-07-18 NOTE — Telephone Encounter (Signed)
P.A. denied for BID dosing.  Appeal letter typed and faxed

## 2021-07-22 ENCOUNTER — Other Ambulatory Visit: Payer: Self-pay | Admitting: Medical

## 2021-07-28 ENCOUNTER — Ambulatory Visit: Payer: Medicare Other | Admitting: Podiatry

## 2021-07-29 NOTE — Telephone Encounter (Signed)
Appeal approved til 10/18/22, left message for pt

## 2021-08-19 ENCOUNTER — Encounter (HOSPITAL_COMMUNITY): Payer: Self-pay

## 2021-08-19 ENCOUNTER — Inpatient Hospital Stay (HOSPITAL_COMMUNITY): Payer: 59 | Attending: Hematology

## 2021-08-25 DIAGNOSIS — Z1231 Encounter for screening mammogram for malignant neoplasm of breast: Secondary | ICD-10-CM

## 2021-08-26 ENCOUNTER — Ambulatory Visit (HOSPITAL_COMMUNITY): Payer: Medicare Other | Admitting: Physician Assistant

## 2021-09-01 ENCOUNTER — Ambulatory Visit (INDEPENDENT_AMBULATORY_CARE_PROVIDER_SITE_OTHER): Payer: 59 | Admitting: Medical

## 2021-09-01 ENCOUNTER — Other Ambulatory Visit: Payer: Self-pay

## 2021-09-01 VITALS — BP 130/88 | HR 87 | Wt 191.4 lb

## 2021-09-01 DIAGNOSIS — I700 Atherosclerosis of aorta: Secondary | ICD-10-CM

## 2021-09-01 DIAGNOSIS — E118 Type 2 diabetes mellitus with unspecified complications: Secondary | ICD-10-CM | POA: Diagnosis not present

## 2021-09-01 DIAGNOSIS — E1142 Type 2 diabetes mellitus with diabetic polyneuropathy: Secondary | ICD-10-CM

## 2021-09-01 DIAGNOSIS — Z23 Encounter for immunization: Secondary | ICD-10-CM | POA: Insufficient documentation

## 2021-09-01 DIAGNOSIS — Z7185 Encounter for immunization safety counseling: Secondary | ICD-10-CM

## 2021-09-01 DIAGNOSIS — I2510 Atherosclerotic heart disease of native coronary artery without angina pectoris: Secondary | ICD-10-CM | POA: Insufficient documentation

## 2021-09-01 DIAGNOSIS — M4316 Spondylolisthesis, lumbar region: Secondary | ICD-10-CM

## 2021-09-01 DIAGNOSIS — F4321 Adjustment disorder with depressed mood: Secondary | ICD-10-CM | POA: Insufficient documentation

## 2021-09-01 DIAGNOSIS — Z8639 Personal history of other endocrine, nutritional and metabolic disease: Secondary | ICD-10-CM

## 2021-09-01 DIAGNOSIS — E1169 Type 2 diabetes mellitus with other specified complication: Secondary | ICD-10-CM

## 2021-09-01 DIAGNOSIS — G4761 Periodic limb movement disorder: Secondary | ICD-10-CM

## 2021-09-01 DIAGNOSIS — G8929 Other chronic pain: Secondary | ICD-10-CM

## 2021-09-01 DIAGNOSIS — Z9889 Other specified postprocedural states: Secondary | ICD-10-CM

## 2021-09-01 DIAGNOSIS — M5442 Lumbago with sciatica, left side: Secondary | ICD-10-CM

## 2021-09-01 DIAGNOSIS — E785 Hyperlipidemia, unspecified: Secondary | ICD-10-CM

## 2021-09-01 DIAGNOSIS — I10 Essential (primary) hypertension: Secondary | ICD-10-CM

## 2021-09-01 DIAGNOSIS — I2583 Coronary atherosclerosis due to lipid rich plaque: Secondary | ICD-10-CM

## 2021-09-01 DIAGNOSIS — M25512 Pain in left shoulder: Secondary | ICD-10-CM

## 2021-09-01 NOTE — Progress Notes (Signed)
Subjective:  Robyn Hill is a 75 y.o. female who presents for Chief Complaint  Patient presents with   diabetes    Diabetes, left shoulder pain x a couple months     Here for med check.  She is here alone today.  Her husband Robyn Hill passed away in 19-Aug-2023.  He was also a patient mine.  She is grieving the loss that was a recent and relatively sudden.    She is doing relatively well.  She and her children went to spread his ashes recently.  They spread them in the ocean and she actually fell in the ocean but did not get hurt.  Diabetes-compliant with medications, using 36 units Toujeo, sugars have been relatively under control.  Compliant with her other medicines as well  Hypertension-compliant with medications  Hyperlipidemia-compliant medications  Her daughter who was living in [place], [place] recently moved in with Robyn Hill, since the death of her husband.  She is single and works as a Pharmacist, hospital in [place].  She will be helping to watch after her mother  Her daughter and recent email me some questions.  Robyn Hill does give me permission to call and talk to her today.  Regarding safety and fall risk, Petrona notes that she does have a bar at her shower to hold onto and has other safety chair by her bed.  She uses a cane.  She does not feel like she needs any other devices at this time.  She notes left shoulder pain for a few months, hurts left upper arm, sometimes hurts arm, get up or reachign back hurts.  No arm numbness or ting of swelling.  She denies any recurrent injury when she fell into the ocean recently.  She was upset that Robyn Hill's son and daughter did not even come to the funeral.  No other aggravating or relieving factors.    No other c/o.  Past Medical History:  Diagnosis Date   Adrenal mass, right ([place])    Allergy    Anemia    Asthma    "as a teenager"   Blind right eye    since childhood   Blood transfusion 1970   Chronic back pain    Chronic diarrhea    Colestipol  therapy   Depression    Diverticulitis    Diverticulosis    Fatty liver    Female bladder prolapse    GERD (gastroesophageal reflux disease)    Glaucoma    H/O bone density study 11/29/2014   normal study although mild decreased in density from prior study   H/O hiatal hernia    Hemorrhoids    History of kidney stones    History of uterine cancer    s/p hysterectomy   Hyperlipidemia    Hypertension    Migraine    hx of migraines in past    Neuromuscular disorder (HCC)    diabetic neuropahthy   Numbness and tingling of both legs    Osteoarthritis    Rheumatic fever    age 70   Type II diabetes mellitus ([place])    Urinary incontinence    Vitamin D deficiency    Current Outpatient Medications on File Prior to Visit  Medication Sig Dispense Refill   atorvastatin (LIPITOR) 40 MG tablet TAKE 1 TABLET BY MOUTH AT BEDTIME 90 tablet 0   Blood Glucose Calibration (ACCU-CHEK INSTANT CONTROL) LIQD Use for controls for accu-chek activa meter 1 each 0   Blood Glucose Monitoring Suppl ([place])  w/Device KIT 1 Device by Does not apply route 2 (two) times daily at 8 am and 10 pm. 1 kit 0   brimonidine (ALPHAGAN) 0.2 % ophthalmic solution Place 1 drop into the left eye 2 times daily.     cetirizine (ZYRTEC) 10 MG tablet Take 10 mg by mouth daily.     Cholecalciferol (VITAMIN D-3) 25 MCG (1000 UT) CAPS Take 1 capsule (1,000 Units total) by mouth daily. 90 capsule 3   dexlansoprazole (DEXILANT) 60 MG capsule Take 1 capsule (60 mg total) by mouth 2 (two) times daily. 180 capsule 3   dorzolamide-timolol (COSOPT) 22.3-6.8 MG/ML ophthalmic solution Place 1 drop into both eyes 2 (two) times daily.     DULoxetine (CYMBALTA) 60 MG capsule Take 1 capsule by mouth once daily 90 capsule 0   enalapril (VASOTEC) 20 MG tablet Take 1 tablet by mouth once daily 90 tablet 0   Exenatide ER (BYDUREON BCISE) 2 MG/0.85ML AUIJ INJECT CONTENTS OF 1 PEN (2MG) INTO THE SKIN ONCE A WEEK 12 mL 3   ferrous  gluconate (FERGON) 324 MG tablet Take 1 tablet (324 mg total) by mouth daily with breakfast. 90 tablet 1   hydrochlorothiazide (HYDRODIURIL) 25 MG tablet Take 1 tablet (25 mg total) by mouth daily. 90 tablet 3   HYDROmorphone (DILAUDID) 4 MG tablet Take 4 mg by mouth every 6 (six) hours as needed.     insulin glargine, 1 Unit Dial, (TOUJEO SOLOSTAR) 300 UNIT/ML Solostar Pen Inject 33 Units into the skin daily. (Patient taking differently: Inject 36 Units into the skin daily.) 15 mL 3   latanoprost (XALATAN) 0.005 % ophthalmic solution Place 1 drop into the right eye at bedtime.     levETIRAcetam (KEPPRA) 250 MG tablet Take 250 mg by mouth 2 (two) times daily.     linaclotide (LINZESS) 145 MCG CAPS capsule Take 1 capsule (145 mcg total) by mouth daily before breakfast. 90 capsule 3   metFORMIN (GLUCOPHAGE) 500 MG tablet Take 1 tablet (500 mg total) by mouth 2 (two) times daily with a meal. 180 tablet 3   PROCTO-MED HC 2.5 % rectal cream APPLY RECTALLY TWICE DAILY 30 g 0   triamcinolone (KENALOG) 0.1 % Apply topically.     Accu-Chek FastClix Lancets MISC USE 1 STRIP TO CHECK GLUCOSE TWICE DAILY 102 each 0   B-D UF III MINI PEN NEEDLES 31G X 5 MM MISC USE TO INJECT TOUJEO AS DIRECTED 100 each 0   glucose blood (ACCU-CHEK GUIDE) test strip USE TO CHECK BLOOD GLUCOSE TWICE DAILY 150 each 0   Lancets MISC daily.     No current facility-administered medications on file prior to visit.   Past Surgical History:  Procedure Laterality Date   ABDOMINAL HYSTERECTOMY  1980   no further pap smears needed   ANTERIOR AND POSTERIOR REPAIR N/A 01/29/2015   Procedure: CYSTOCELE AND RECTOCELE ;  Surgeon: Robyn Loser, MD;  Location: [facility];  Service: Urology;  Laterality: N/A;   BACK SURGERY     BREAST BIOPSY  08/1997   right   CARDIAC CATHETERIZATION  11/2006   CHOLECYSTECTOMY     COLONOSCOPY  2014   polyps, external hemorrhoids   CYSTOSCOPY N/A 01/29/2015   Procedure: CYSTOSCOPY;  Surgeon: Robyn Loser, MD;  Location: [facility];  Service: Urology;  Laterality: N/A;   DILATION AND CURETTAGE OF UTERUS  1980   EDSI for back pain  last 01/2009   multiple x 5   EYE SURGERY  Bil Blind R eye   KIDNEY STONE SURGERY  1979   LIVER BIOPSY  04/1999   minor laceration repair     right eye   POSTERIOR FUSION LUMBAR SPINE  04/05/12   PULSE GENERATOR IMPLANT Right 03/12/2016   Procedure: Right Reposition of implantable pulse generator;  Surgeon: Erline Levine, MD;  Location: [facility];  Service: Neurosurgery;  Laterality: Right;  Right Reposition of implantable pulse generator   SPINAL CORD STIMULATOR INSERTION N/A 09/06/2015   Procedure: LUMBAR SPINAL CORD STIMULATOR INSERTION;  Surgeon: Clydell Hakim, MD;  Location: [facility];  Service: Neurosurgery;  Laterality: N/A;  Spinal Cord Stimulator placement   SPINAL CORD STIMULATOR INSERTION N/A 10/22/2015   Procedure: Laminectomy for spinal cord stimulator paddle lead and implantable generator placement;  Surgeon: Erline Levine, MD;  Location: [facility];  Service: Neurosurgery;  Laterality: N/A;  Laminectomy for spinal cord stimulator paddle lead and implantable generator placement   steroid injections of hip  last 02/2009   multiple x [place]   bilaterally; "for glaucoma"   UPPER GI ENDOSCOPY  05/21/14   food, retained in the body of the stomach   VAGINAL PROLAPSE REPAIR N/A 01/29/2015   Procedure:  VAULT PROLAPSE WITH GRAFT ;  Surgeon: Robyn Loser, MD;  Location: [facility];  Service: Urology;  Laterality: N/A;     The following portions of the patient's history were reviewed and updated as appropriate: allergies, current medications, past family history, past medical history, past social history, past surgical history and problem list.  ROS Otherwise as in subjective above  Objective: BP 130/88   Pulse 87   Wt 191 lb 6.4 oz (86.8 kg)   BMI 31.85 kg/m   General appearance:  alert, no distress, well developed, well nourished Neck: supple, no lymphadenopathy, no thyromegaly, no masses, nontender Heart: RRR, normal S1, S2, no murmurs Lungs: CTA bilaterally, no wheezes, rhonchi, or rales Pulses: 2+ radial pulses, 1+ pedal pulses, normal cap refill Tender over left shoulder AC joint and biceps origin, pain noted with crossover test, pain noted with empty can test, somewhat decreased range of motion with internal and external range of motion of shoulder, otherwise on nontender with relatively normal range of motion.  Strength seems within normal limits. Otherwise arms neurovascularly intact    Assessment: Encounter Diagnoses  Name Primary?   Diabetes mellitus with complication ([facility]) Yes   Grieving    Needs flu shot    History of thyroid disease    Dyslipidemia associated with type 2 diabetes mellitus ([place])    Diabetic polyneuropathy associated with type 2 diabetes mellitus ([place])    Essential hypertension, benign    Aortic atherosclerosis (HCC)    Chronic left shoulder pain    Vaccine counseling    Spondylolisthesis of lumbar region    Periodic limb movement disorder    History of back surgery    Chronic bilateral low back pain with left-sided sciatica    Coronary artery disease due to lipid rich plaque      Plan: We talked a lot today about the loss of her husband recently in October.  He was my patient as well.  We spent a lot of time just talking, given her grief.  I had spoken to her on the phone last week as well.    She did give me permission to call her daughter Lynelle Smoke to discuss questions and Tammy sent me by  email recently.  I also asked [place] to go ahead and update her HIPPA privacy records today  Diabetes-routine labs today, continue current medication . Consider change from Bydureon to Trulicity for easier device since she has trouble with the bydureon pen.  Hypertension-continue current medication  Aortic atherosclerosis,  hypertension-referral back to cardiology for updated evaluation since has been several years since she seen cardiology and has known CAD and atherosclerosis  Chronic left shoulder pain-referral to orthopedics  Chronic back pain, history of back surgery-pain managed by neurosurgery  Counseled on the influenza virus vaccine.  Vaccine information sheet given.   High dose Influenza vaccine given after consent obtained.    Suzzanne was seen today for diabetes.  Diagnoses and all orders for this visit:  Diabetes mellitus with complication ([employer]) -     Comprehensive metabolic panel -     Hemoglobin A1c  Grieving  Needs flu shot -     Flu Vaccine QUAD High Dose(Fluad)  History of thyroid disease  Dyslipidemia associated with type 2 diabetes mellitus (HCC) -     Comprehensive metabolic panel -     CBC with Differential/Platelet  Diabetic polyneuropathy associated with type 2 diabetes mellitus (HCC)  Essential hypertension, benign -     Comprehensive metabolic panel -     CBC with Differential/Platelet  Aortic atherosclerosis ([place]) -     Ambulatory referral to Cardiology  Chronic left shoulder pain -     AMB referral to orthopedics  Vaccine counseling  Spondylolisthesis of lumbar region  Periodic limb movement disorder  History of back surgery  Chronic bilateral low back pain with left-sided sciatica  Coronary artery disease due to lipid rich plaque -     Ambulatory referral to Cardiology   Follow up: pending labs, referral

## 2021-09-01 NOTE — Patient Instructions (Signed)
Check insurance coverage for Trulicity, Mounjaro and Ozempic.    Either of these pen devices would probably be easier than the Bydureon pen device.

## 2021-09-02 ENCOUNTER — Other Ambulatory Visit: Payer: Self-pay | Admitting: Medical

## 2021-09-02 LAB — CBC WITH DIFFERENTIAL/PLATELET
Basophils Absolute: 0.1 10*3/uL (ref 0.0–0.2)
Basos: 1 %
EOS (ABSOLUTE): 0.3 10*3/uL (ref 0.0–0.4)
Eos: 3 %
Hematocrit: 44 % (ref 34.0–46.6)
Hemoglobin: 14.5 g/dL (ref 11.1–15.9)
Immature Grans (Abs): 0 10*3/uL (ref 0.0–0.1)
Immature Granulocytes: 0 %
Lymphocytes Absolute: 3.3 10*3/uL — ABNORMAL HIGH (ref 0.7–3.1)
Lymphs: 28 %
MCH: 27.5 pg (ref 26.6–33.0)
MCHC: 33 g/dL (ref 31.5–35.7)
MCV: 84 fL (ref 79–97)
Monocytes Absolute: 0.9 10*3/uL (ref 0.1–0.9)
Monocytes: 8 %
Neutrophils Absolute: 7.1 10*3/uL — ABNORMAL HIGH (ref 1.4–7.0)
Neutrophils: 60 %
Platelets: 353 10*3/uL (ref 150–450)
RBC: 5.27 x10E6/uL (ref 3.77–5.28)
RDW: 12.1 % (ref 11.7–15.4)
WBC: 11.7 10*3/uL — ABNORMAL HIGH (ref 3.4–10.8)

## 2021-09-02 LAB — COMPREHENSIVE METABOLIC PANEL
ALT: 32 IU/L (ref 0–32)
AST: 31 IU/L (ref 0–40)
Albumin/Globulin Ratio: 2 (ref 1.2–2.2)
Albumin: 5 g/dL — ABNORMAL HIGH (ref 3.7–4.7)
Alkaline Phosphatase: 123 IU/L — ABNORMAL HIGH (ref 44–121)
BUN/Creatinine Ratio: 22 (ref 12–28)
BUN: 18 mg/dL (ref 8–27)
Bilirubin Total: 0.7 mg/dL (ref 0.0–1.2)
CO2: 25 mmol/L (ref 20–29)
Calcium: 10.2 mg/dL (ref 8.7–10.3)
Chloride: 96 mmol/L (ref 96–106)
Creatinine, Ser: 0.81 mg/dL (ref 0.57–1.00)
Globulin, Total: 2.5 g/dL (ref 1.5–4.5)
Glucose: 132 mg/dL — ABNORMAL HIGH (ref 70–99)
Potassium: 4.9 mmol/L (ref 3.5–5.2)
Sodium: 137 mmol/L (ref 134–144)
Total Protein: 7.5 g/dL (ref 6.0–8.5)
eGFR: 76 mL/min/{1.73_m2} (ref >59)

## 2021-09-02 LAB — HEMOGLOBIN A1C
Est. average glucose Bld gHb Est-mCnc: 166 mg/dL
Hgb A1c MFr Bld: 7.4 % — ABNORMAL HIGH (ref 4.8–5.6)

## 2021-09-02 MED ORDER — TRULICITY 3 MG/0.5ML ~~LOC~~ SOAJ: 3.0000 mg | SUBCUTANEOUS | 6 refills | Status: DC

## 2021-09-17 ENCOUNTER — Other Ambulatory Visit: Payer: Self-pay

## 2021-09-17 ENCOUNTER — Ambulatory Visit: Payer: 59 | Admitting: Orthopaedic Surgery

## 2021-09-22 ENCOUNTER — Other Ambulatory Visit: Payer: Self-pay | Admitting: Medical

## 2021-09-25 ENCOUNTER — Ambulatory Visit (HOSPITAL_COMMUNITY): Payer: 59

## 2021-10-03 ENCOUNTER — Other Ambulatory Visit: Payer: Self-pay

## 2021-10-03 ENCOUNTER — Other Ambulatory Visit (HOSPITAL_COMMUNITY): Payer: Self-pay | Admitting: Cardiology

## 2021-10-03 ENCOUNTER — Ambulatory Visit (HOSPITAL_COMMUNITY)
Admission: RE | Admit: 2021-10-03 | Discharge: 2021-10-03 | Disposition: A | Discharge status: Home / Self Care | Payer: 59 | Source: Ambulatory Visit | Attending: Cardiovascular Disease | Admitting: Cardiovascular Disease

## 2021-10-03 DIAGNOSIS — I6523 Occlusion and stenosis of bilateral carotid arteries: Secondary | ICD-10-CM | POA: Insufficient documentation

## 2021-10-06 ENCOUNTER — Encounter: Payer: Self-pay | Admitting: *Deleted

## 2021-10-14 ENCOUNTER — Encounter (HOSPITAL_COMMUNITY): Payer: 59

## 2021-10-15 ENCOUNTER — Telehealth: Payer: Self-pay | Admitting: Medical

## 2021-10-15 NOTE — Progress Notes (Signed)
°  Chronic Care Management   Outreach Note  10/15/2021 Name: SHONNIE POUDRIER MRN: [id number] DOB: [birth year]  Referred by: Carlena Hurl, PA-C Reason for referral : No chief complaint on file.   An unsuccessful telephone outreach was attempted today. The patient was referred to the pharmacist for assistance with care management and care coordination.   Follow Up Plan:   Tatjana Dellinger Upstream Scheduler

## 2021-10-29 ENCOUNTER — Ambulatory Visit (INDEPENDENT_AMBULATORY_CARE_PROVIDER_SITE_OTHER): Payer: 59 | Admitting: Orthopaedic Surgery

## 2021-10-29 ENCOUNTER — Ambulatory Visit: Payer: Self-pay

## 2021-10-29 ENCOUNTER — Encounter: Payer: Self-pay | Admitting: Orthopaedic Surgery

## 2021-10-29 ENCOUNTER — Other Ambulatory Visit: Payer: Self-pay

## 2021-10-29 DIAGNOSIS — G8929 Other chronic pain: Secondary | ICD-10-CM

## 2021-10-29 DIAGNOSIS — M545 Low back pain, unspecified: Secondary | ICD-10-CM | POA: Insufficient documentation

## 2021-10-29 DIAGNOSIS — M25512 Pain in left shoulder: Secondary | ICD-10-CM | POA: Diagnosis not present

## 2021-10-29 MED ORDER — BUPIVACAINE HCL 0.25 % IJ SOLN
2.0000 mL | INTRAMUSCULAR | Status: AC | PRN
  Administered 2021-10-29: 2 mL via INTRA_ARTICULAR

## 2021-10-29 MED ORDER — LIDOCAINE HCL 2 % IJ SOLN
2.0000 mL | INTRAMUSCULAR | Status: AC | PRN
  Administered 2021-10-29: 2 mL

## 2021-10-29 NOTE — Progress Notes (Signed)
Office Visit Note   Patient: Robyn Hill           Date of Birth: 1946-07-28           MRN: [id number] Visit Date: 10/29/2021              Requested by: Carlena Hurl, PA-C [address] [place] [facility] [place],  [place] [address] PCP: Carlena Hurl, PA-C   Assessment & Plan: Visit Diagnoses:  1. Chronic left shoulder pain     Plan: 76 year old female relates several month history of left shoulder pain to the point of compromise.  She is having difficulty sleeping on that side and even raising her arm over her head.  Denies injury or trauma.  There is been no numbness or tingling or obvious referred pain from the cervical spine.  There is a chronic history of back pain and she is presently taking Dilaudid and Celebrex.  These are not necessarily helping her left shoulder.  Films were essentially nondiagnostic other than some calcification within the rotator cuff.  I suspect she does have a rotator cuff tear with positive impingement and some weakness with external rotation.  From a diagnostic and therapeutic standpoint will inject the subacromial space with betamethasone and monitor response.  If no improvement over the next 3 weeks would suggest an MRI scan.  All questions were answered  Follow-Up Instructions: Return if symptoms worsen or fail to improve.   Orders:  Orders Placed This Encounter  Procedures   XR Shoulder Left   No orders of the defined types were placed in this encounter.     Procedures: Large Joint Inj: L subacromial bursa on 10/29/2021 12:36 PM Indications: pain and diagnostic evaluation Details: 25 G 1.5 in needle, anterolateral approach  Arthrogram: No  Medications: 2 mL lidocaine 2 %; 2 mL bupivacaine 0.25 %  12 mg betamethasone injected with Marcaine and Xylocaine in the subacromial space left shoulder Consent was given by the patient. Immediately prior to procedure a time out was called to verify the correct patient, procedure, equipment, support staff  and site/side marked as required. Patient was prepped and draped in the usual sterile fashion.      Clinical Data: No additional findings.   Subjective: Chief Complaint  Patient presents with   Left Shoulder - Pain  Patient presents today for her left shoulder and proximal arm pain. She said that it started many months ago. No known injury. She has pain in her proximal arm and posterior shoulder. She has decreased range of motion. No numbness or tingling. She takes hydromorphone and celebrex. She is right hand dominant.  Has a history of chronic low back pain for which she takes the above medicine  HPI  Review of Systems   Objective: Vital Signs: There were no vitals taken for this visit.  Physical Exam Constitutional:      Appearance: She is well-developed.  Pulmonary:     Effort: Pulmonary effort is normal.  Skin:    General: Skin is warm and dry.  Neurological:     Mental Status: She is alert and oriented to person, place, and time.  Psychiatric:        Behavior: Behavior normal.    Ortho Exam awake and oriented x3.  Comfortable sitting.  No pain with range of motion of the cervical spine referred to the left shoulder.  Does have full active overhead motion of the left shoulder but with a circuitous arc of motion.  Positive impingement on  extreme of external rotation.  Positive empty can testing.  Negative Speed sign.  Biceps appears to be intact.  There was tenderness in the anterior lateral subacromial region but no crepitation.  Definitely had some weakness with external rotation  Specialty Comments:  No specialty comments available.  Imaging: XR Shoulder Left  Result Date: 10/29/2021 Films of the left shoulder were obtained in several projections.  There is an area of ectopic calcification laterally in the subacromial region which is believed contained within the rotator cuff.  The acromion appears to be relatively flat.  There are AC joint changes.  Humeral head  is centered about the glenoid.  Normal space between the humeral head and the acromion.  No acute changes.    PMFS History: Patient Active Problem List   Diagnosis Date Noted   Low back pain 10/29/2021   Needs flu shot 09/01/2021   Chronic left shoulder pain 09/01/2021   Grieving 09/01/2021   Coronary artery disease due to lipid rich plaque 09/01/2021   Encounter for health maintenance examination in adult 05/23/2021   Flat foot 05/23/2021   Dyslipidemia associated with type 2 diabetes mellitus ([place]) 05/23/2021   Medicare annual wellness visit, subsequent 05/22/2021   Osteopenia 05/22/2021   Advanced directives, counseling/discussion 05/22/2021   Pruritic condition 05/22/2021   Myalgia 05/22/2021   Chronic cough 01/08/2021   History of COVID-19 10/09/2020   Aortic atherosclerosis ([place]) 10/09/2020   Gastroesophageal reflux disease 07/22/2020   Hiatal hernia 07/22/2020   Muscle spasm 06/05/2020   Chest wall pain 06/05/2020   Post-menopausal 11/16/2019   Estrogen deficiency 11/16/2019   Loose stools 10/11/2019   Urinary incontinence 07/17/2019   Diabetic polyneuropathy associated with type 2 diabetes mellitus ([place]) 11/09/2018   Hammer toes of both feet 11/09/2018   Pre-ulcerative calluses 11/09/2018   Decreased pedal pulses 11/09/2018   Atrial fibrillation ([place]) 08/05/2018   Vaccine counseling 03/31/2018   Constipation due to opioid therapy 09/21/2016   History of back surgery 09/21/2016   Nocturnal leg movements 08/26/2016   Vitamin D deficiency 08/26/2016   Periodic limb movement disorder 08/03/2016   Fatigue 08/03/2016   Anemia of chronic disease 08/03/2016   Post laminectomy syndrome 03/12/2016   Sweating abnormality 02/19/2016   Hemorrhoids 02/19/2016   Gastroesophageal reflux disease without esophagitis 02/19/2016   Chronic pain 10/22/2015   History of thyroid disease 08/06/2015   Arthritis 04/18/2015   Prolapse of female bladder, acquired 01/29/2015   History of  uterine cancer 05/02/2014   Essential hypertension, benign 01/03/2014   Chronic back pain 01/03/2014   Glaucoma 01/03/2014   Leukocytosis 01/03/2014   Spondylolisthesis of lumbar region 11/02/2013   Diabetes mellitus with complication ([place]) 32/95/1884   Past Medical History:  Diagnosis Date   Adrenal mass, right ([place])    Allergy    Anemia    Asthma    "as a teenager"   Blind right eye    since childhood   Blood transfusion 1970   Chronic back pain    Chronic diarrhea    Colestipol therapy   Depression    Diverticulitis    Diverticulosis    Fatty liver    Female bladder prolapse    GERD (gastroesophageal reflux disease)    Glaucoma    H/O bone density study 11/29/2014   normal study although mild decreased in density from prior study   H/O hiatal hernia    Hemorrhoids    History of kidney stones    History of uterine cancer  s/p hysterectomy   Hyperlipidemia    Hypertension    Migraine    hx of migraines in past    Neuromuscular disorder (HCC)    diabetic neuropahthy   Numbness and tingling of both legs    Osteoarthritis    Rheumatic fever    age 85   Type II diabetes mellitus ([place])    Urinary incontinence    Vitamin D deficiency     Family History  Problem Relation Age of Onset   Heart attack Brother 43   Hypertension Other    Diabetes Other    Cancer Mother 15   Aplastic anemia Mother 84   Heart attack Brother 64   Colon cancer Brother    Heart attack Sister    Heart attack Brother 20   Anesthesia problems Neg Hx     Past Surgical History:  Procedure Laterality Date   ABDOMINAL HYSTERECTOMY  1980   no further pap smears needed   ANTERIOR AND POSTERIOR REPAIR N/A 01/29/2015   Procedure: CYSTOCELE AND RECTOCELE ;  Surgeon: Bjorn Loser, MD;  Location: [facility];  Service: Urology;  Laterality: N/A;   BACK SURGERY     BREAST BIOPSY  08/1997   right   CARDIAC CATHETERIZATION  11/2006   CHOLECYSTECTOMY     COLONOSCOPY  2014   polyps, external  hemorrhoids   CYSTOSCOPY N/A 01/29/2015   Procedure: CYSTOSCOPY;  Surgeon: Bjorn Loser, MD;  Location: [facility];  Service: Urology;  Laterality: N/A;   DILATION AND CURETTAGE OF UTERUS  1980   EDSI for back pain  last 01/2009   multiple x 5   EYE SURGERY     Bil Blind R eye   KIDNEY STONE SURGERY  1979   LIVER BIOPSY  04/1999   minor laceration repair     right eye   POSTERIOR FUSION LUMBAR SPINE  04/05/12   PULSE GENERATOR IMPLANT Right 03/12/2016   Procedure: Right Reposition of implantable pulse generator;  Surgeon: Erline Levine, MD;  Location: [facility];  Service: Neurosurgery;  Laterality: Right;  Right Reposition of implantable pulse generator   SPINAL CORD STIMULATOR INSERTION N/A 09/06/2015   Procedure: LUMBAR SPINAL CORD STIMULATOR INSERTION;  Surgeon: Clydell Hakim, MD;  Location: [facility];  Service: Neurosurgery;  Laterality: N/A;  Spinal Cord Stimulator placement   SPINAL CORD STIMULATOR INSERTION N/A 10/22/2015   Procedure: Laminectomy for spinal cord stimulator paddle lead and implantable generator placement;  Surgeon: Erline Levine, MD;  Location: [facility];  Service: Neurosurgery;  Laterality: N/A;  Laminectomy for spinal cord stimulator paddle lead and implantable generator placement   steroid injections of hip  last 02/2009   multiple x [place]   bilaterally; "for glaucoma"   UPPER GI ENDOSCOPY  05/21/14   food, retained in the body of the stomach   VAGINAL PROLAPSE REPAIR N/A 01/29/2015   Procedure:  VAULT PROLAPSE WITH GRAFT ;  Surgeon: Bjorn Loser, MD;  [facility];  Service: Urology;  Laterality: N/A;   Social History   Occupational History   Occupation: Retired   Tobacco Use   Smoking status: Never   Smokeless tobacco: Never  Vaping Use   Vaping Use: Never used  Substance and Sexual Activity   Alcohol use: Yes    Comment: rare   Drug use: No   Sexual activity: Never    Comment: married

## 2021-10-31 ENCOUNTER — Other Ambulatory Visit: Payer: Self-pay | Admitting: Medical

## 2021-11-02 DIAGNOSIS — I499 Cardiac arrhythmia, unspecified: Secondary | ICD-10-CM | POA: Insufficient documentation

## 2021-11-02 DIAGNOSIS — I6529 Occlusion and stenosis of unspecified carotid artery: Secondary | ICD-10-CM | POA: Insufficient documentation

## 2021-11-02 NOTE — Progress Notes (Signed)
Cardiology Office Note   Date:  11/04/2021   ID:  Robyn Hill, DOB 06-12-1946, MRN [id number]  PCP:  Carlena Hurl, PA-C  Cardiologist:   Minus Breeding, MD    No chief complaint on file.     History of Present Illness: Robyn Hill is a 76 y.o. female who presents for evaluation of possible atrial fib. When I saw her in the office for evaluation of this possibly seen on an EKG at her primary care office I thought that this was PAT.  She wore a monitor.   I did not see atrial fib on this monitor.  I last saw her in 2019.    She returns for follow-up visit accompanied by her daughter.  She is reporting palpitations.  These seem to happen every few days although she cannot really quantify or qualify them.  She will feel her heart rate going high.  Her daughter brought her a Watch and she shows me the heart rate variability indicating slowly rising heart rates into the 120s 130s.  However, when I look at the rhythm strips they appear to be sinus although there is baseline artifact.  She feels diaphoretic when this happens.  She has increased weakness when they happen.  She is not describing chest pressure, neck or arm discomfort.  She has not had any presyncope or syncope.  She feels weak all the time.  She does not sleep well.  She is not having any new shortness of breath, PND or orthopnea.  She has not had any new weight gain or edema.   Past Medical History:  Diagnosis Date   Adrenal mass, right ([place])    Allergy    Anemia    Asthma    "as a teenager"   Blind right eye    since childhood   Blood transfusion 1970   Chronic back pain    Chronic diarrhea    Colestipol therapy   Depression    Diverticulitis    Diverticulosis    Fatty liver    Female bladder prolapse    GERD (gastroesophageal reflux disease)    Glaucoma    H/O bone density study 11/29/2014   normal study although mild decreased in density from prior study   H/O hiatal hernia    Hemorrhoids     History of kidney stones    History of uterine cancer    s/p hysterectomy   Hyperlipidemia    Hypertension    Migraine    hx of migraines in past    Neuromuscular disorder (HCC)    diabetic neuropahthy   Numbness and tingling of both legs    Osteoarthritis    Rheumatic fever    age 28   Type II diabetes mellitus ([place])    Urinary incontinence    Vitamin D deficiency     Past Surgical History:  Procedure Laterality Date   ABDOMINAL HYSTERECTOMY  1980   no further pap smears needed   ANTERIOR AND POSTERIOR REPAIR N/A 01/29/2015   Procedure: CYSTOCELE AND RECTOCELE ;  Surgeon: Bjorn Loser, MD;  [facility];  Service: Urology;  Laterality: N/A;   BACK SURGERY     BREAST BIOPSY  08/1997   right   CARDIAC CATHETERIZATION  11/2006   CHOLECYSTECTOMY     COLONOSCOPY  2014   polyps, external hemorrhoids   CYSTOSCOPY N/A 01/29/2015   Procedure: CYSTOSCOPY;  Surgeon: Bjorn Loser, MD;  [facility];  Service: Urology;  Laterality: N/A;   DILATION AND CURETTAGE OF UTERUS  1980   EDSI for back pain  last 01/2009   multiple x 5   EYE SURGERY     Bil Blind R eye   KIDNEY STONE SURGERY  1979   LIVER BIOPSY  04/1999   minor laceration repair     right eye   POSTERIOR FUSION LUMBAR SPINE  04/05/12   PULSE GENERATOR IMPLANT Right 03/12/2016   Procedure: Right Reposition of implantable pulse generator;  Surgeon: Erline Levine, MD;  Location: [facility];  Service: Neurosurgery;  Laterality: Right;  Right Reposition of implantable pulse generator   SPINAL CORD STIMULATOR INSERTION N/A 09/06/2015   Procedure: LUMBAR SPINAL CORD STIMULATOR INSERTION;  Surgeon: Clydell Hakim, MD;  Location: [facility];  Service: Neurosurgery;  Laterality: N/A;  Spinal Cord Stimulator placement   SPINAL CORD STIMULATOR INSERTION N/A 10/22/2015   Procedure: Laminectomy for spinal cord stimulator paddle lead and implantable generator placement;  Surgeon: Erline Levine, MD;  Location: [facility];   Service: Neurosurgery;  Laterality: N/A;  Laminectomy for spinal cord stimulator paddle lead and implantable generator placement   steroid injections of hip  last 02/2009   multiple x [place]   bilaterally; "for glaucoma"   UPPER GI ENDOSCOPY  05/21/14   food, retained in the body of the stomach   VAGINAL PROLAPSE REPAIR N/A 01/29/2015   Procedure:  VAULT PROLAPSE WITH GRAFT ;  Surgeon: Bjorn Loser, MD;  [facility];  Service: Urology;  Laterality: N/A;     Current Outpatient Medications  Medication Sig Dispense Refill   Accu-Chek FastClix Lancets MISC USE 1 STRIP TO CHECK GLUCOSE TWICE DAILY 102 each 0   atorvastatin (LIPITOR) 40 MG tablet TAKE 1 TABLET BY MOUTH AT BEDTIME 90 tablet 0   Blood Glucose Calibration (ACCU-CHEK INSTANT CONTROL) LIQD Use for controls for accu-chek activa meter 1 each 0   Blood Glucose Monitoring Suppl (ACCU-CHEK GUIDE ME) w/Device KIT 1 Device by Does not apply route 2 (two) times daily at 8 am and 10 pm. 1 kit 0   brimonidine (ALPHAGAN) 0.2 % ophthalmic solution Place 1 drop into the left eye 2 times daily.     cetirizine (ZYRTEC) 10 MG tablet Take 10 mg by mouth daily.     Cholecalciferol (VITAMIN D-3) 25 MCG (1000 UT) CAPS Take 1 capsule (1,000 Units total) by mouth daily. 90 capsule 3   dexlansoprazole (DEXILANT) 60 MG capsule Take 1 capsule (60 mg total) by mouth 2 (two) times daily. 180 capsule 3   dorzolamide-timolol (COSOPT) 22.3-6.8 MG/ML ophthalmic solution Place 1 drop into both eyes 2 (two) times daily.     Dulaglutide (TRULICITY) 3 XK/5.5VZ SOPN Inject 3 mg as directed once a week. 0.5 mL 6   DULoxetine (CYMBALTA) 60 MG capsule Take 1 capsule by mouth once daily 90 capsule 0   enalapril (VASOTEC) 20 MG tablet Take 1 tablet by mouth once daily 90 tablet 0   ferrous gluconate (FERGON) 324 MG tablet Take 1 tablet (324 mg total) by mouth daily with breakfast. 90 tablet 1   glucose blood  (ACCU-CHEK GUIDE) test strip USE TO CHECK BLOOD GLUCOSE TWICE DAILY 150 each 0   hydrochlorothiazide (HYDRODIURIL) 25 MG tablet Take 1 tablet (25 mg total) by mouth daily. 90 tablet 3   HYDROmorphone (DILAUDID) 4 MG tablet Take 4 mg by mouth every 6 (six) hours as needed.  insulin glargine, 1 Unit Dial, (TOUJEO SOLOSTAR) 300 UNIT/ML Solostar Pen Inject 33 Units into the skin daily. (Patient taking differently: Inject 36 Units into the skin daily.) 15 mL 3   Insulin Pen Needle (B-D UF III MINI PEN NEEDLES) 31G X 5 MM MISC USE AS DIRECTED TO INJECT TOUJEO 100 each 0   Lancets MISC daily.     latanoprost (XALATAN) 0.005 % ophthalmic solution Place 1 drop into the right eye at bedtime.     levETIRAcetam (KEPPRA) 250 MG tablet Take 250 mg by mouth 2 (two) times daily.     metFORMIN (GLUCOPHAGE) 500 MG tablet Take 1 tablet (500 mg total) by mouth 2 (two) times daily with a meal. 180 tablet 3   PROCTO-MED HC 2.5 % rectal cream APPLY RECTALLY TWICE DAILY 30 g 0   triamcinolone (KENALOG) 0.1 % Apply topically.     No current facility-administered medications for this visit.    Allergies:   Kiwi extract, Lyrica [pregabalin], Sunflowerseed oil, Acetaminophen, Oxycodone hcl, Hydrocodone-acetaminophen, Invokana [canagliflozin], Neurontin [gabapentin], Nitrofuran derivatives, and Tape   Family History  Problem Relation Age of Onset   Heart attack Brother 3   Hypertension Other    Diabetes Other    Cancer Mother 109   Aplastic anemia Mother 64   Heart attack Brother 71   Colon cancer Brother    Heart attack Sister    Heart attack Brother 92   Anesthesia problems Neg Hx    Social History   Socioeconomic History   Marital status: Widowed    Spouse name: Lake Bells    Number of children: 3   Years of education: Not on file   Highest education level: Some college, no degree  Occupational History   Occupation: Retired   Tobacco Use   Smoking status: Never   Smokeless tobacco: Never  Vaping Use    Vaping Use: Never used  Substance and Sexual Activity   Alcohol use: Yes    Comment: rare   Drug use: No   Sexual activity: Never    Comment: married  Other Topics Concern   Not on file  Social History Narrative   Three children and two step.     Right handed   Caffeine rarely    Lives at home with husband    Social Determinants of Health   Financial Resource Strain: Low Risk    Difficulty of Paying Living Expenses: Not hard at all  Food Insecurity: No Food Insecurity   Worried About Charity fundraiser in the Last Year: Never true   Arboriculturist in the Last Year: Never true  Transportation Needs: No Transportation Needs   Lack of Transportation (Medical): No   Lack of Transportation (Non-Medical): No  Physical Activity: Inactive   Days of Exercise per Week: 0 days   Minutes of Exercise per Session: 0 min  Stress: No Stress Concern Present   Feeling of Stress : Not at all  Social Connections: Moderately Integrated   Frequency of Communication with Friends and Family: More than three times a week   Frequency of Social Gatherings with Friends and Family: More than three times a week   Attends Religious Services: More than 4 times per year   Active Member of [employer] or Organizations: No   Attends Archivist Meetings: Never   Marital Status: Married  Human resources officer Violence: Not At Risk   Fear of Current or Ex-Partner: No   Emotionally Abused: No   Physically  Abused: No   Sexually Abused: No     ROS:  Please see the history of present illness.   Otherwise, review of systems are positive for none.   All other systems are reviewed and negative.    PHYSICAL EXAM: VS:  BP 132/64    Pulse 77    Ht 5' 4.5" (1.638 m)    Wt 185 lb (83.9 kg)    SpO2 95%    BMI 31.26 kg/m  , BMI Body mass index is 31.26 kg/m.  GENERAL:  Well appearing NECK:  No jugular venous distention, waveform within normal limits, carotid upstroke brisk and symmetric, no bruits, no  thyromegaly LUNGS:  Clear to auscultation bilaterally CHEST:  Unremarkable HEART:  PMI not displaced or sustained,S1 and S2 within normal limits, no S3, no S4, no clicks, no rubs, no murmurs ABD:  Flat, positive bowel sounds normal in frequency in pitch, no bruits, no rebound, no guarding, no midline pulsatile mass, no hepatomegaly, no splenomegaly EXT:  2 plus pulses throughout, no edema, no cyanosis no clubbing   EKG:  EKG is  Not ordered today. Normal sinus rhythm, rate 64, leftward axis, intervals within normal limits, no acute ST-T wave changes.  Recent Labs: 05/22/2021: TSH 1.770 09/01/2021: ALT 32; BUN 18; Creatinine, Ser 0.81; Hemoglobin 14.5; Platelets 353; Potassium 4.9; Sodium 137    Lipid Panel    Component Value Date/Time   CHOL 133 05/22/2021 0940   TRIG 99 05/22/2021 0940   HDL 57 05/22/2021 0940   CHOLHDL 2.3 05/22/2021 0940   CHOLHDL 2.6 11/27/2016 1035   VLDL 24 11/27/2016 1035   LDLCALC 58 05/22/2021 0940     Lab Results  Component Value Date   HGBA1C 7.4 (H) 09/01/2021     Wt Readings from Last 3 Encounters:  11/04/21 185 lb (83.9 kg)  09/01/21 191 lb 6.4 oz (86.8 kg)  05/22/21 197 lb 6.4 oz (89.5 kg)      Other studies Reviewed: Additional studies/ records that were reviewed today include: None. Review of the above records demonstrates:  None   ASSESSMENT AND PLAN:   DM:   A1c was 6.6 in January of this year.  No change in therapy.   CAROTID STENOSIS:    Right stenosis is 40 - 60%.  I spent quite a bit of time reviewing all of her carotid Dopplers back to 2019 and explaining a lot of the terminology since it is now available to them My Chart.  I think they were satisfied with the explanation.  ARRHYTHMIA:   Her watch was not diagnostic.  I am going to arrange for 4-week monitor.   Current medicines are reviewed at length with the patient today.  The patient does not have concerns regarding medicines.  The following changes have been made:   None  Labs/ tests ordered today include:   Orders Placed This Encounter  Procedures   CARDIAC EVENT MONITOR     Disposition:   FU with in after the monitor. Ronnell Guadalaj[place], MD  11/04/2021 2:59 PM    [place]

## 2021-11-04 ENCOUNTER — Ambulatory Visit (INDEPENDENT_AMBULATORY_CARE_PROVIDER_SITE_OTHER): Payer: 59 | Admitting: Cardiology

## 2021-11-04 ENCOUNTER — Encounter: Payer: Self-pay | Admitting: *Deleted

## 2021-11-04 ENCOUNTER — Other Ambulatory Visit: Payer: Self-pay

## 2021-11-04 ENCOUNTER — Encounter: Payer: Self-pay | Admitting: Cardiology

## 2021-11-04 VITALS — BP 132/64 | HR 77 | Ht 64.5 in | Wt 185.0 lb

## 2021-11-04 DIAGNOSIS — E118 Type 2 diabetes mellitus with unspecified complications: Secondary | ICD-10-CM

## 2021-11-04 DIAGNOSIS — R002 Palpitations: Secondary | ICD-10-CM | POA: Diagnosis not present

## 2021-11-04 DIAGNOSIS — I6529 Occlusion and stenosis of unspecified carotid artery: Secondary | ICD-10-CM | POA: Diagnosis not present

## 2021-11-04 DIAGNOSIS — I499 Cardiac arrhythmia, unspecified: Secondary | ICD-10-CM | POA: Diagnosis not present

## 2021-11-04 NOTE — Patient Instructions (Signed)
Medication Instructions:  Your physician recommends that you continue on your current medications as directed. Please refer to the Current Medication list given to you today.  *If you need a refill on your cardiac medications before your next appointment, please call your pharmacy*   Lab Work: NONE   Testing/Procedures: Your physician has recommended that you wear an event monitor. Event monitors are medical devices that record the hearts electrical activity. Doctors most often [place] these monitors to diagnose arrhythmias. Arrhythmias are problems with the speed or rhythm of the heartbeat. The monitor is a small, portable device. You can wear one while you do your normal daily activities. This is usually used to diagnose what is causing palpitations/syncope (passing out). 11 DAY   Follow-Up: At [facility], you and your health needs are our priority.  As part of our continuing mission to provide you with exceptional heart care, we have created designated Provider Care Teams.  These Care Teams include your primary Cardiologist (physician) and Advanced Practice Providers (APPs -  Physician Assistants and Nurse Practitioners) who all work together to provide you with the care you need, when you need it.  We recommend signing up for the patient portal called "MyChart".  Sign up information is provided on this After Visit Summary.  MyChart is used to connect with patients for Virtual Visits (Telemedicine).  Patients are able to view lab/test results, encounter notes, upcoming appointments, etc.  Non-urgent messages can be sent to your provider as well.   To learn more about what you can do with MyChart, go to [email or web address].    Your next appointment:   6-8  week(s)  The format for your next appointment:   In Person  Provider:   Minus Breeding, MD    Other Instructions  [employer] Cardiac Event Monitor Instructions Your physician has requested you wear your cardiac event monitor  for _____ days, (1-30). [employer] may call or text to confirm a shipping address. The monitor will be sent to a land address via UPS. [employer] will not ship a monitor to a PO BOX. It typically takes 3-5 days to receive your monitor after it has been enrolled. [employer] will assist with USPS tracking if your package is delayed. The telephone number for [employer] is [phone]. Once you have received your monitor, please review the enclosed instructions. Instruction tutorials can also be viewed under help and settings on the enclosed cell phone. Your monitor has already been registered assigning a specific monitor serial # to you.  Applying the monitor Remove cell phone from case and turn it on. The cell phone works as Dealer and needs to be within [employer] of you at all times. The cell phone will need to be charged on a daily basis. We recommend you plug the cell phone into the enclosed charger at your bedside table every night.  Monitor batteries: You will receive two monitor batteries labelled #1 and #2. These are your recorders. Plug battery #2 onto the second connection on the enclosed charger. Keep one battery on the charger at all times. This will keep the monitor battery deactivated. It will also keep it fully charged for when you need to switch your monitor batteries. A small light will be blinking on the battery emblem when it is charging. The light on the battery emblem will remain on when the battery is fully charged.  Open package of a Monitor strip. Insert battery #1 into black hood on strip and gently squeeze monitor battery onto connection  as indicated in instruction booklet. Set aside while preparing skin.  Choose location for your strip, vertical or horizontal, as indicated in the instruction booklet. Shave to remove all hair from location. There cannot be any lotions, oils, powders, or colognes on skin where monitor is to be applied. Wipe skin clean with  enclosed Saline wipe. Dry skin completely.  Peel paper labeled #1 off the back of the Monitor strip exposing the adhesive. Place the monitor on the chest in the vertical or horizontal position shown in the instruction booklet. One arrow on the monitor strip must be pointing upward. Carefully remove paper labeled #2, attaching remainder of strip to your skin. Try not to create any folds or wrinkles in the strip as you apply it.  Firmly press and release the circle in the center of the monitor battery. You will hear a small beep. This is turning the monitor battery on. The heart emblem on the monitor battery will light up every 5 seconds if the monitor battery in turned on and connected to the patient securely. Do not push and hold the circle down as this turns the monitor battery off. The cell phone will locate the monitor battery. A screen will appear on the cell phone checking the connection of your monitor strip. This may read poor connection initially but change to good connection within the next minute. Once your monitor accepts the connection you will hear a series of 3 beeps followed by a climbing crescendo of beeps. A screen will appear on the cell phone showing the two monitor strip placement options. Touch the picture that demonstrates where you applied the monitor strip.  Your monitor strip and battery are waterproof. You are able to shower, bathe, or swim with the monitor on. They just ask you do not submerge deeper than 3 feet underwater. We recommend removing the monitor if you are swimming in a lake, river, or ocean.  Your monitor battery will need to be switched to a fully charged monitor battery approximately once a week. The cell phone will alert you of an action which needs to be made.  On the cell phone, tap for details to reveal connection status, monitor battery status, and cell phone battery status. The green dots indicates your monitor is in good status. A red dot  indicates there is something that needs your attention.  To record a symptom, click the circle on the monitor battery. In 30-60 seconds a list of symptoms will appear on the cell phone. Select your symptom and tap save. Your monitor will record a sustained or significant arrhythmia regardless of you clicking the button. Some patients do not feel the heart rhythm irregularities. [employer] will notify us of any serious or critical events.  Refer to instruction booklet for instructions on switching batteries, changing strips, the Do not disturb or Pause features, or any additional questions.  Call [employer] at [phone], to confirm your monitor is transmitting and record your baseline. They will answer any questions you may have regarding the monitor instructions at that time.  Returning the monitor to [employer] all equipment back into blue box. Peel off strip of paper to expose adhesive and close box securely. There is a prepaid UPS shipping label on this box. Drop in a UPS drop box, or at a UPS facility like [employer]. You may also contact [employer] to arrange UPS to pick up monitor package at your home.

## 2021-11-04 NOTE — Progress Notes (Signed)
Patient ID: Robyn Hill, female   DOB: 1946/03/13, 76 y.o.   MRN: [id number] Patient enrolled for [employer] to ship a 30 day cardiac event monitor to her address on file.

## 2021-11-12 ENCOUNTER — Other Ambulatory Visit: Payer: Self-pay | Admitting: Medical

## 2021-11-12 ENCOUNTER — Other Ambulatory Visit: Payer: Self-pay | Admitting: Family Medicine

## 2021-11-20 ENCOUNTER — Telehealth: Payer: Self-pay

## 2021-11-20 ENCOUNTER — Other Ambulatory Visit: Payer: Self-pay

## 2021-11-20 DIAGNOSIS — G8929 Other chronic pain: Secondary | ICD-10-CM

## 2021-11-20 DIAGNOSIS — M25512 Pain in left shoulder: Secondary | ICD-10-CM

## 2021-11-20 NOTE — Telephone Encounter (Signed)
Urgent MRI left shoulder

## 2021-11-20 NOTE — Telephone Encounter (Signed)
Pts daughter called triage wanting to work pt in due to increase pain in shoulder. No falls or injuries. I see in the note Dr. Durward Fortes recommended a MRI but the daughter stated she needs relief today. Pt is already taking dilaudid for pain. Not taking the celebrex. Please advise

## 2021-12-01 ENCOUNTER — Other Ambulatory Visit: Payer: Self-pay | Admitting: Medical

## 2021-12-05 ENCOUNTER — Other Ambulatory Visit: Payer: Self-pay

## 2021-12-05 ENCOUNTER — Telehealth: Payer: Self-pay | Admitting: Medical

## 2021-12-05 ENCOUNTER — Other Ambulatory Visit: Payer: Self-pay | Admitting: Medical

## 2021-12-05 ENCOUNTER — Ambulatory Visit (HOSPITAL_COMMUNITY)
Admission: RE | Admit: 2021-12-05 | Discharge: 2021-12-05 | Disposition: A | Discharge status: Home / Self Care | Payer: 59 | Source: Ambulatory Visit | Attending: Orthopaedic Surgery | Admitting: Orthopaedic Surgery

## 2021-12-05 DIAGNOSIS — M25512 Pain in left shoulder: Secondary | ICD-10-CM | POA: Insufficient documentation

## 2021-12-05 DIAGNOSIS — G8929 Other chronic pain: Secondary | ICD-10-CM | POA: Insufficient documentation

## 2021-12-05 MED ORDER — OZEMPIC (1 MG/DOSE) 4 MG/3ML ~~LOC~~ SOPN: 1.0000 mg | PEN_INJECTOR | SUBCUTANEOUS | 1 refills | Status: DC

## 2021-12-05 NOTE — Telephone Encounter (Signed)
Pt called and states that she is unable to find Trulicity at any pharmacy and she has called several. She would like another medication sent in. Pt can be reached at [phone].

## 2021-12-05 NOTE — Telephone Encounter (Signed)
Left message for pt about medication

## 2021-12-08 ENCOUNTER — Other Ambulatory Visit: Payer: Self-pay | Admitting: Medical

## 2021-12-08 ENCOUNTER — Telehealth: Payer: Self-pay | Admitting: Medical

## 2021-12-08 MED ORDER — MOUNJARO 5 MG/0.5ML ~~LOC~~ SOAJ: 5.0000 mg | SUBCUTANEOUS | 2 refills | Status: DC

## 2021-12-08 NOTE — Telephone Encounter (Signed)
Pt called and states that the pharmacy is out ozempic, She called them and they do have to the trulicty 1.5 if you want to send that but she will use more Or they have the victoza also if you want to change to the medicine instead  Pt also starting taking her celebrex again and said that was helping but needs a refill on that  Please send to the  [place], [place]

## 2021-12-11 ENCOUNTER — Other Ambulatory Visit: Payer: Self-pay | Admitting: Medical

## 2021-12-16 ENCOUNTER — Ambulatory Visit: Payer: 59 | Admitting: Orthopaedic Surgery

## 2021-12-16 ENCOUNTER — Telehealth: Payer: Self-pay | Admitting: Medical

## 2021-12-16 ENCOUNTER — Ambulatory Visit (INDEPENDENT_AMBULATORY_CARE_PROVIDER_SITE_OTHER): Payer: 59 | Admitting: Orthopaedic Surgery

## 2021-12-16 ENCOUNTER — Encounter: Payer: Self-pay | Admitting: Orthopaedic Surgery

## 2021-12-16 ENCOUNTER — Other Ambulatory Visit: Payer: Self-pay | Admitting: Medical

## 2021-12-16 DIAGNOSIS — G8929 Other chronic pain: Secondary | ICD-10-CM | POA: Diagnosis not present

## 2021-12-16 DIAGNOSIS — M7542 Impingement syndrome of left shoulder: Secondary | ICD-10-CM

## 2021-12-16 DIAGNOSIS — M25512 Pain in left shoulder: Secondary | ICD-10-CM | POA: Diagnosis not present

## 2021-12-16 NOTE — Telephone Encounter (Signed)
PT CALLED AND IS REQUESTING A REFILL ON CELEBREX PLEASE SEND TO Agency [address], [place]

## 2021-12-16 NOTE — Progress Notes (Signed)
Office Visit Note   Patient: Robyn Hill           Date of Birth: 01-Jun-1946           MRN: [id number] Visit Date: 12/16/2021              Requested by: Carlena Hurl, PA-C [place],  [place] [address] PCP: Carlena Hurl, PA-C   Assessment & Plan: Visit Diagnoses: Left shoulder pain  Plan: Woman here in follow-up for an MRI of her left shoulder.  She states her symptoms are about the same.  She did not get much relief from an injection.  MRI was reviewed with her today.  She does have mild arthropathy of the [facility] joint as well as the glenohumeral joint.  Also findings consistent with some fraying of the supraspinatus.  We have recommended a short course of physical therapy to learn a home exercise program.  We also could consider another injection.  She understands it may be a couple months before she gets good relief.  She is also asking for a sling as needed and we gave this to her as well.  Follow-Up Instructions: No follow-ups on file.   Orders:  No orders of the defined types were placed in this encounter.  No orders of the defined types were placed in this encounter.     Procedures: No procedures performed   Clinical Data: No additional findings.   Subjective: No chief complaint on file. Patient presents today for follow up on her left shoulder. She had an MRI and is here today for those results.    Review of Systems  All other systems reviewed and are negative.   Objective: Vital Signs: There were no vitals taken for this visit.  Physical Exam Constitutional:      Appearance: Normal appearance.  Pulmonary:     Effort: Pulmonary effort is normal.  Skin:    General: Skin is warm and dry.  Neurological:     General: No focal deficit present.     Mental Status: She is alert and oriented to person, place, and time.    Ortho Exam Examination of her left shoulder she has pain with overhead movement and behind her back.  She has good  grip strength.  Tender over the [facility] joint and impingement findings. Specialty Comments:  No specialty comments available.  Imaging: No results found.   PMFS History: Patient Active Problem List   Diagnosis Date Noted   Cardiac arrhythmia 11/02/2021   Stenosis of carotid artery 11/02/2021   Low back pain 10/29/2021   Needs flu shot 09/01/2021   Chronic left shoulder pain 09/01/2021   Grieving 09/01/2021   Coronary artery disease due to lipid rich plaque 09/01/2021   Encounter for health maintenance examination in adult 05/23/2021   Flat foot 05/23/2021   Dyslipidemia associated with type 2 diabetes mellitus ([place]) 05/23/2021   Medicare annual wellness visit, subsequent 05/22/2021   Osteopenia 05/22/2021   Advanced directives, counseling/discussion 05/22/2021   Pruritic condition 05/22/2021   Myalgia 05/22/2021   Chronic cough 01/08/2021   History of COVID-19 10/09/2020   Aortic atherosclerosis ([place]) 10/09/2020   Gastroesophageal reflux disease 07/22/2020   Hiatal hernia 07/22/2020   Muscle spasm 06/05/2020   Chest wall pain 06/05/2020   Post-menopausal 11/16/2019   Estrogen deficiency 11/16/2019   Loose stools 10/11/2019   Urinary incontinence 07/17/2019   Diabetic polyneuropathy associated with type 2 diabetes mellitus ([place]) 11/09/2018   Hammer toes  of both feet 11/09/2018   Pre-ulcerative calluses 11/09/2018   Decreased pedal pulses 11/09/2018   Atrial fibrillation ([place]) 08/05/2018   Vaccine counseling 03/31/2018   Constipation due to opioid therapy 09/21/2016   History of back surgery 09/21/2016   Nocturnal leg movements 08/26/2016   Vitamin D deficiency 08/26/2016   Periodic limb movement disorder 08/03/2016   Fatigue 08/03/2016   Anemia of chronic disease 08/03/2016   Post laminectomy syndrome 03/12/2016   Sweating abnormality 02/19/2016   Hemorrhoids 02/19/2016   Gastroesophageal reflux disease without esophagitis 02/19/2016   Chronic pain 10/22/2015    History of thyroid disease 08/06/2015   Arthritis 04/18/2015   Prolapse of female bladder, acquired 01/29/2015   History of uterine cancer 05/02/2014   Essential hypertension, benign 01/03/2014   Chronic back pain 01/03/2014   Glaucoma 01/03/2014   Leukocytosis 01/03/2014   Spondylolisthesis of lumbar region 11/02/2013   Diabetes mellitus with complication ([place]) 90/24/0973   Past Medical History:  Diagnosis Date   Adrenal mass, right ([place])    Allergy    Anemia    Asthma    "as a teenager"   Blind right eye    since childhood   Blood transfusion 1970   Chronic back pain    Chronic diarrhea    Colestipol therapy   Depression    Diverticulitis    Diverticulosis    Fatty liver    Female bladder prolapse    GERD (gastroesophageal reflux disease)    Glaucoma    H/O bone density study 11/29/2014   normal study although mild decreased in density from prior study   H/O hiatal hernia    Hemorrhoids    History of kidney stones    History of uterine cancer    s/p hysterectomy   Hyperlipidemia    Hypertension    Migraine    hx of migraines in past    Neuromuscular disorder ([place])    diabetic neuropahthy   Numbness and tingling of both legs    Osteoarthritis    Rheumatic fever    age 50   Type II diabetes mellitus ([place])    Urinary incontinence    Vitamin D deficiency     Family History  Problem Relation Age of Onset   Heart attack Brother 26   Hypertension Other    Diabetes Other    Cancer Mother 5   Aplastic anemia Mother 48   Heart attack Brother 63   Colon cancer Brother    Heart attack Sister    Heart attack Brother 3   Anesthesia problems Neg Hx     Past Surgical History:  Procedure Laterality Date   ABDOMINAL HYSTERECTOMY  1980   no further pap smears needed   ANTERIOR AND POSTERIOR REPAIR N/A 01/29/2015   Procedure: CYSTOCELE AND RECTOCELE ;  Surgeon: Bjorn Loser, MD;  Location: [facility];  Service: Urology;  Laterality: N/A;   BACK SURGERY     BREAST  BIOPSY  08/1997   right   CARDIAC CATHETERIZATION  11/2006   CHOLECYSTECTOMY     COLONOSCOPY  2014   polyps, external hemorrhoids   CYSTOSCOPY N/A 01/29/2015   Procedure: CYSTOSCOPY;  Surgeon: Bjorn Loser, MD;  Location: [facility];  Service: Urology;  Laterality: N/A;   DILATION AND CURETTAGE OF UTERUS  1980   EDSI for back pain  last 01/2009   multiple x 5   EYE SURGERY     Bil Blind R eye   [place]  LIVER BIOPSY  04/1999   minor laceration repair     right eye   POSTERIOR FUSION LUMBAR SPINE  04/05/12   PULSE GENERATOR IMPLANT Right 03/12/2016   Procedure: Right Reposition of implantable pulse generator;  Surgeon: Erline Levine, MD;  Location: [place] [facility];  Service: Neurosurgery;  Laterality: Right;  Right Reposition of implantable pulse generator   SPINAL CORD STIMULATOR INSERTION N/A 09/06/2015   Procedure: LUMBAR SPINAL CORD STIMULATOR INSERTION;  Surgeon: Clydell Hakim, MD;  Location: [place] [facility];  Service: Neurosurgery;  Laterality: N/A;  Spinal Cord Stimulator placement   SPINAL CORD STIMULATOR INSERTION N/A 10/22/2015   Procedure: Laminectomy for spinal cord stimulator paddle lead and implantable generator placement;  Surgeon: Erline Levine, MD;  Location: [place] [facility];  Service: Neurosurgery;  Laterality: N/A;  Laminectomy for spinal cord stimulator paddle lead and implantable generator placement   steroid injections of hip  last 02/2009   multiple x [place]   bilaterally; "for glaucoma"   UPPER GI ENDOSCOPY  05/21/14   food, retained in the body of the stomach   VAGINAL PROLAPSE REPAIR N/A 01/29/2015   Procedure:  VAULT PROLAPSE WITH GRAFT ;  Surgeon: Bjorn Loser, MD;  Location: [facility];  Service: Urology;  Laterality: N/A;   Social History   Occupational History   Occupation: Retired   Tobacco Use   Smoking status: Never   Smokeless tobacco: Never  Vaping Use   Vaping Use: Never used   Substance and Sexual Activity   Alcohol use: Yes    Comment: rare   Drug use: No   Sexual activity: Never    Comment: married

## 2021-12-17 ENCOUNTER — Other Ambulatory Visit: Payer: Self-pay | Admitting: Medical

## 2021-12-17 ENCOUNTER — Other Ambulatory Visit: Payer: Self-pay

## 2021-12-17 DIAGNOSIS — G8929 Other chronic pain: Secondary | ICD-10-CM

## 2021-12-17 DIAGNOSIS — M25512 Pain in left shoulder: Secondary | ICD-10-CM

## 2021-12-17 MED ORDER — CELECOXIB 200 MG PO CAPS: 200.0000 mg | ORAL_CAPSULE | Freq: Every day | ORAL | 2 refills | Status: DC

## 2021-12-17 NOTE — Telephone Encounter (Signed)
Pt. Called back stating she takes it once a day. She stated that she was off of it for a long time but her arthritis started getting worse so she had to start taking it again.  ?

## 2021-12-18 ENCOUNTER — Telehealth: Payer: Self-pay | Admitting: Cardiology

## 2021-12-18 NOTE — Telephone Encounter (Signed)
Patient has a heart monitor that she has to wear but is unsure of how to put on. She states that she will be in [place] tomorrow and wants to know if she can come in and have a nurse put it on for her. Please advise.  ?

## 2021-12-18 NOTE — Telephone Encounter (Signed)
Patient will come in tom at 2 to have monitor placed ?

## 2021-12-19 ENCOUNTER — Telehealth: Payer: Self-pay

## 2021-12-19 ENCOUNTER — Ambulatory Visit: Payer: 59

## 2021-12-19 ENCOUNTER — Ambulatory Visit (INDEPENDENT_AMBULATORY_CARE_PROVIDER_SITE_OTHER): Payer: 59

## 2021-12-19 VITALS — Ht 65.0 in | Wt 190.0 lb

## 2021-12-19 DIAGNOSIS — Z0000 Encounter for general adult medical examination without abnormal findings: Secondary | ICD-10-CM

## 2021-12-19 NOTE — Telephone Encounter (Signed)
Daughter called that pt fell last night and can't come.  Talked with Nurse advisor and ok for telephone visit.  Pt informed.  Also asked her if she needed to be seen and pt said not at this time, only rib hurting.

## 2021-12-19 NOTE — Patient Instructions (Signed)
[name] , Thank you for taking time to come for your Medicare Wellness Visit. I appreciate your ongoing commitment to your health goals. Please review the following plan we discussed and let me know if I can assist you in the future.   Screening recommendations/referrals: Colonoscopy: completed 08/21/2013, due 08/21/2013 Mammogram: not required Bone Density: completed 01/23/2021 Recommended yearly ophthalmology/optometry visit for glaucoma screening and checkup Recommended yearly dental visit for hygiene and checkup  Vaccinations: Influenza vaccine: completed 09/01/2021, due next flu season Pneumococcal vaccine: completed 03/31/2018 Tdap vaccine: due Shingles vaccine: discussed   Covid-19: decline  Advanced directives: Please bring a copy of your POA (Power of Attorney) and/or Living Will to your next appointment.   Conditions/risks identified: none  Next appointment: Follow up in one year for your annual wellness visit    Preventive Care 65 Years and Older, Female Preventive care refers to lifestyle choices and visits with your health care provider that can promote health and wellness. What does preventive care include? A yearly physical exam. This is also called an annual well check. Dental exams once or twice a year. Routine eye exams. Ask your health care provider how often you should have your eyes checked. Personal lifestyle choices, including: Daily care of your teeth and gums. Regular physical activity. Eating a healthy diet. Avoiding tobacco and drug use. Limiting alcohol use. Practicing safe sex. Taking low-dose aspirin every day. Taking vitamin and mineral supplements as recommended by your health care provider. What happens during an annual well check? The services and screenings done by your health care provider during your annual well check will depend on your age, overall health, lifestyle risk factors, and family history of disease. Counseling  Your health care  provider may ask you questions about your: Alcohol use. Tobacco use. Drug use. Emotional well-being. Home and relationship well-being. Sexual activity. Eating habits. History of falls. Memory and ability to understand (cognition). Work and work Statistician. Reproductive health. Screening  You may have the following tests or measurements: Height, weight, and BMI. Blood pressure. Lipid and cholesterol levels. These may be checked every 5 years, or more frequently if you are over 25 years old. Skin check. Lung cancer screening. You may have this screening every year starting at age 105 if you have a 30-pack-year history of smoking and currently smoke or have quit within the past 15 years. Fecal occult blood test (FOBT) of the stool. You may have this test every year starting at age 21. Flexible sigmoidoscopy or colonoscopy. You may have a sigmoidoscopy every 5 years or a colonoscopy every 10 years starting at age 63. Hepatitis C blood test. Hepatitis B blood test. Sexually transmitted disease (STD) testing. Diabetes screening. This is done by checking your blood sugar (glucose) after you have not eaten for a while (fasting). You may have this done every 1-3 years. Bone density scan. This is done to screen for osteoporosis. You may have this done starting at age 65. Mammogram. This may be done every 1-2 years. Talk to your health care provider about how often you should have regular mammograms. Talk with your health care provider about your test results, treatment options, and if necessary, the need for more tests. Vaccines  Your health care provider may recommend certain vaccines, such as: Influenza vaccine. This is recommended every year. Tetanus, diphtheria, and acellular pertussis (Tdap, Td) vaccine. You may need a Td booster every 10 years. Zoster vaccine. You may need this after age 71. Pneumococcal 13-valent conjugate (PCV13) vaccine. One  dose is recommended after age  4. Pneumococcal polysaccharide (PPSV23) vaccine. One dose is recommended after age 40. Talk to your health care provider about which screenings and vaccines you need and how often you need them. This information is not intended to replace advice given to you by your health care provider. Make sure you discuss any questions you have with your health care provider. Document Released: 11/01/2015 Document Revised: 06/24/2016 Document Reviewed: 08/06/2015 Elsevier Interactive Patient Education  2017 [place] Prevention in the Home Falls can cause injuries. They can happen to people of all ages. There are many things you can do to make your home safe and to help prevent falls. What can I do on the outside of my home? Regularly fix the edges of walkways and driveways and fix any cracks. Remove anything that might make you trip as you walk through a door, such as a raised step or threshold. Trim any bushes or trees on the path to your home. Use bright outdoor lighting. Clear any walking paths of anything that might make someone trip, such as rocks or tools. Regularly check to see if handrails are loose or broken. Make sure that both sides of any steps have handrails. Any raised decks and porches should have guardrails on the edges. Have any leaves, snow, or ice cleared regularly. Use sand or salt on walking paths during winter. Clean up any spills in your garage right away. This includes oil or grease spills. What can I do in the bathroom? Use night lights. Install grab bars by the toilet and in the tub and shower. Do not use towel bars as grab bars. Use non-skid mats or decals in the tub or shower. If you need to sit down in the shower, use a plastic, non-slip stool. Keep the floor dry. Clean up any water that spills on the floor as soon as it happens. Remove soap buildup in the tub or shower regularly. Attach bath mats securely with double-sided non-slip rug tape. Do not have throw  rugs and other things on the floor that can make you trip. What can I do in the bedroom? Use night lights. Make sure that you have a light by your bed that is easy to reach. Do not use any sheets or blankets that are too big for your bed. They should not hang down onto the floor. Have a firm chair that has side arms. You can use this for support while you get dressed. Do not have throw rugs and other things on the floor that can make you trip. What can I do in the kitchen? Clean up any spills right away. Avoid walking on wet floors. Keep items that you use a lot in easy-to-reach places. If you need to reach something above you, use a strong step stool that has a grab bar. Keep electrical cords out of the way. Do not use floor polish or wax that makes floors slippery. If you must use wax, use non-skid floor wax. Do not have throw rugs and other things on the floor that can make you trip. What can I do with my stairs? Do not leave any items on the stairs. Make sure that there are handrails on both sides of the stairs and use them. Fix handrails that are broken or loose. Make sure that handrails are as long as the stairways. Check any carpeting to make sure that it is firmly attached to the stairs. Fix any carpet that is loose or worn.  Avoid having throw rugs at the top or bottom of the stairs. If you do have throw rugs, attach them to the floor with carpet tape. Make sure that you have a light switch at the top of the stairs and the bottom of the stairs. If you do not have them, ask someone to add them for you. What else can I do to help prevent falls? Wear shoes that: Do not have high heels. Have rubber bottoms. Are comfortable and fit you well. Are closed at the toe. Do not wear sandals. If you use a stepladder: Make sure that it is fully opened. Do not climb a closed stepladder. Make sure that both sides of the stepladder are locked into place. Ask someone to hold it for you, if  possible. Clearly mark and make sure that you can see: Any grab bars or handrails. First and last steps. Where the edge of each step is. Use tools that help you move around (mobility aids) if they are needed. These include: Canes. Walkers. Scooters. Crutches. Turn on the lights when you go into a dark area. Replace any light bulbs as soon as they burn out. Set up your furniture so you have a clear path. Avoid moving your furniture around. If any of your floors are uneven, fix them. If there are any pets around you, be aware of where they are. Review your medicines with your doctor. Some medicines can make you feel dizzy. This can increase your chance of falling. Ask your doctor what other things that you can do to help prevent falls. This information is not intended to replace advice given to you by your health care provider. Make sure you discuss any questions you have with your health care provider. Document Released: 08/01/2009 Document Revised: 03/12/2016 Document Reviewed: 11/09/2014 Elsevier Interactive Patient Education  2017 [employer].

## 2021-12-19 NOTE — Progress Notes (Addendum)
I connected with [name] today by telephone and verified that I am speaking with the correct person using two identifiers. Location patient: home Location provider: work Persons participating in the virtual visit: Gerturde Kuba, Lynelle Smoke (daughter), Glenna Durand LPN.   I discussed the limitations, risks, security and privacy concerns of performing an evaluation and management service by telephone and the availability of in person appointments. I also discussed with the patient that there may be a patient responsible charge related to this service. The patient expressed understanding and verbally consented to this telephonic visit.    Interactive audio and video telecommunications were attempted between this provider and patient, however failed, due to patient having technical difficulties OR patient did not have access to video capability.  We continued and completed visit with audio only.     Vital signs may be patient reported or missing.  Subjective:   Robyn Hill is a 76 y.o. female who presents for Medicare Annual (Subsequent) preventive examination.  Review of Systems     Cardiac Risk Factors include: advanced age (>24mn, >>28women);diabetes mellitus;hypertension;obesity (BMI >30kg/m2)     Objective:    Today's Vitals   12/19/21 1541 12/19/21 1542  Weight: 190 lb (86.2 kg)   Height: 5' 5" (1.651 m)   PainSc:  1    Body mass index is 31.62 kg/m.  Advanced Directives 12/19/2021 02/18/2021 01/28/2021 05/31/2017 07/10/2016 03/10/2016 10/16/2015  Does Patient Have a Medical Advance Directive? _0  No Yes  Type of AParamedicof A[place]fordLiving will HCl[place]ondaleLiving will Living will;Healthcare Power of ABurlington JunctionLiving will HClarksvilleLiving will - HGreeleyLiving will  Does patient want to make changes to medical advance directive? - No - Patient declined No -  Patient declined No - Patient declined No - Patient declined - No - Patient declined  Copy of [place] Chart? No - copy requested No - copy requested - No - copy requested No - copy requested - No - copy requested  Would patient like information on creating a medical advance directive? - No - Patient declined No - Patient declined - - Yes - Educational materials given -  Pre-existing out of facility DNR order (yellow form or pink MOST form) - - - - - - -    Current Medications (verified) Outpatient Encounter Medications as of 12/19/2021  Medication Sig   Accu-Chek FastClix Lancets MISC USE 1 STRIP TO CHECK GLUCOSE TWICE DAILY   atorvastatin (LIPITOR) 40 MG tablet TAKE 1 TABLET BY MOUTH AT BEDTIME   Blood Glucose Calibration (ACCU-CHEK INSTANT CONTROL) LIQD Use for controls for accu-chek activa meter   Blood Glucose Monitoring Suppl (ACCU-CHEK GUIDE ME) w/Device KIT 1 Device by Does not apply route 2 (two) times daily at 8 am and 10 pm.   brimonidine (ALPHAGAN) 0.2 % ophthalmic solution Place 1 drop into the left eye 2 times daily.   celecoxib (CELEBREX) 200 MG capsule Take 1 capsule (200 mg total) by mouth daily.   cetirizine (ZYRTEC) 10 MG tablet Take 10 mg by mouth daily.   Cholecalciferol (VITAMIN D-3) 25 MCG (1000 UT) CAPS Take 1 capsule (1,000 Units total) by mouth daily.   dexlansoprazole (DEXILANT) 60 MG capsule Take 1 capsule (60 mg total) by mouth 2 (two) times daily.   dorzolamide-timolol (COSOPT) 22.3-6.8 MG/ML ophthalmic solution Place 1 drop into both eyes 2 (two) times daily.   DULoxetine (  CYMBALTA) 60 MG capsule Take 1 capsule by mouth once daily   enalapril (VASOTEC) 20 MG tablet Take 1 tablet by mouth once daily   ferrous gluconate (FERGON) 324 MG tablet Take 1 tablet by mouth once daily with breakfast   glucose blood (ACCU-CHEK GUIDE) test strip USE TO CHECK BLOOD GLUCOSE TWICE DAILY   hydrochlorothiazide (HYDRODIURIL) 25 MG tablet Take 1 tablet (25 mg  total) by mouth daily.   HYDROmorphone (DILAUDID) 4 MG tablet Take 4 mg by mouth every 6 (six) hours as needed.   Insulin Pen Needle (B-D UF III MINI PEN NEEDLES) 31G X 5 MM MISC USE AS DIRECTED TO INJECT TOUJEO   latanoprost (XALATAN) 0.005 % ophthalmic solution Place 1 drop into the right eye at bedtime.   levETIRAcetam (KEPPRA) 250 MG tablet Take 250 mg by mouth 2 (two) times daily.   metFORMIN (GLUCOPHAGE) 500 MG tablet Take 1 tablet (500 mg total) by mouth 2 (two) times daily with a meal.   PROCTO-MED HC 2.5 % rectal cream APPLY CREAM RECTALLY TWICE DAILY   tirzepatide (MOUNJARO) 5 MG/0.5ML Pen Inject 5 mg into the skin once a week.   triamcinolone (KENALOG) 0.1 % Apply topically.   insulin glargine, 1 Unit Dial, (TOUJEO SOLOSTAR) 300 UNIT/ML Solostar Pen Inject 33 Units into the skin daily. (Patient not taking: Reported on 12/19/2021)   No facility-administered encounter medications on file as of 12/19/2021.    Allergies (verified) Kiwi extract, Lyrica [pregabalin], Sunflowerseed oil, Acetaminophen, Oxycodone hcl, Hydrocodone-acetaminophen, Invokana [canagliflozin], Neurontin [gabapentin], Nitrofuran derivatives, and Tape   History: Past Medical History:  Diagnosis Date   Adrenal mass, right ([place])    Allergy    Anemia    Asthma    "as a teenager"   Blind right eye    since childhood   Blood transfusion 1970   Chronic back pain    Chronic diarrhea    Colestipol therapy   Depression    Diverticulitis    Diverticulosis    Fatty liver    Female bladder prolapse    GERD (gastroesophageal reflux disease)    Glaucoma    H/O bone density study 11/29/2014   normal study although mild decreased in density from prior study   H/O hiatal hernia    Hemorrhoids    History of kidney stones    History of uterine cancer    s/p hysterectomy   Hyperlipidemia    Hypertension    Migraine    hx of migraines in past    Neuromuscular disorder (HCC)    diabetic neuropahthy   Numbness and  tingling of both legs    Osteoarthritis    Rheumatic fever    age 77   Type II diabetes mellitus ([place])    Urinary incontinence    Vitamin D deficiency    Past Surgical History:  Procedure Laterality Date   ABDOMINAL HYSTERECTOMY  1980   no further pap smears needed   ANTERIOR AND POSTERIOR REPAIR N/A 01/29/2015   Procedure: CYSTOCELE AND RECTOCELE ;  Surgeon: Bjorn Loser, MD;  Location: [facility];  Service: Urology;  Laterality: N/A;   BACK SURGERY     BREAST BIOPSY  08/1997   right   CARDIAC CATHETERIZATION  11/2006   CHOLECYSTECTOMY     COLONOSCOPY  2014   polyps, external hemorrhoids   CYSTOSCOPY N/A 01/29/2015   Procedure: CYSTOSCOPY;  Surgeon: Bjorn Loser, MD;  Location: [facility];  Service: Urology;  Laterality: N/A;   DILATION AND CURETTAGE OF  UTERUS  1980   EDSI for back pain  last 01/2009   multiple x 5   EYE SURGERY     Bil Blind R eye   KIDNEY STONE SURGERY  1979   LIVER BIOPSY  04/1999   minor laceration repair     right eye   POSTERIOR FUSION LUMBAR SPINE  04/05/12   PULSE GENERATOR IMPLANT Right 03/12/2016   Procedure: Right Reposition of implantable pulse generator;  Surgeon: Erline Levine, MD;  Location: [facility];  Service: Neurosurgery;  Laterality: Right;  Right Reposition of implantable pulse generator   SPINAL CORD STIMULATOR INSERTION N/A 09/06/2015   Procedure: LUMBAR SPINAL CORD STIMULATOR INSERTION;  Surgeon: Clydell Hakim, MD;  Location: [facility];  Service: Neurosurgery;  Laterality: N/A;  Spinal Cord Stimulator placement   SPINAL CORD STIMULATOR INSERTION N/A 10/22/2015   Procedure: Laminectomy for spinal cord stimulator paddle lead and implantable generator placement;  Surgeon: Erline Levine, MD;  Location: [facility];  Service: Neurosurgery;  Laterality: N/A;  Laminectomy for spinal cord stimulator paddle lead and implantable generator placement   steroid injections of hip  last 02/2009   multiple x [place]   bilaterally; "for glaucoma"   UPPER GI ENDOSCOPY  05/21/14   food, retained in the body of the stomach   VAGINAL PROLAPSE REPAIR N/A 01/29/2015   Procedure:  VAULT PROLAPSE WITH GRAFT ;  Surgeon: Bjorn Loser, MD;  Location[facility];  Service: Urology;  Laterality: N/A;   Family History  Problem Relation Age of Onset   Heart attack Brother 57   Hypertension Other    Diabetes Other    Cancer Mother 17   Aplastic anemia Mother 59   Heart attack Brother 37   Colon cancer Brother    Heart attack Sister    Heart attack Brother 95   Anesthesia problems Neg Hx    Social History   Socioeconomic History   Marital status: Widowed    Spouse name: Lake Bells    Number of children: 3   Years of education: Not on file   Highest education level: Some college, no degree  Occupational History   Occupation: Retired   Tobacco Use   Smoking status: Never   Smokeless tobacco: Never  Vaping Use   Vaping Use: Never used  Substance and Sexual Activity   Alcohol use: Yes    Comment: rare   Drug use: No   Sexual activity: Not Currently    Comment: married  Other Topics Concern   Not on file  Social History Narrative   Three children and two step.     Right handed   Caffeine rarely    Lives at home with husband    Social Determinants of Health   Financial Resource Strain: Medium Risk   Difficulty of Paying Living Expenses: Somewhat hard  Food Insecurity: No Food Insecurity   Worried About Charity fundraiser in the Last Year: Never true   Ran Out of Food in the Last Year: Never true  Transportation Needs: No Transportation Needs   Lack of Transportation (Medical): No   Lack of Transportation (Non-Medical): No  Physical Activity: Inactive   Days of Exercise per Week: 0 days   Minutes of Exercise per Session: 0 min  Stress: No Stress Concern Present   Feeling of Stress : Not at all  Social Connections: Moderately Integrated   Frequency of Communication with  Friends and Family: More than three times a week   Frequency of Social Gatherings with Friends and Family: More than three times a week   Attends Religious Services: More than 4 times per year   Active Member of [employer] or Organizations: No   Attends Music therapist: Never   Marital Status: Married    Tobacco Counseling Counseling given: Not Answered   Clinical Intake:  Pre-visit preparation completed: Yes  Pain : 0-10 Pain Score: 1  Pain Type: Acute pain Pain Location: Rib cage Pain Orientation: Right Pain Descriptors / Indicators: Aching     Nutritional Status: BMI > 30  Obese Nutritional Risks: None Diabetes: Yes  How often do you need to have someone help you when you read instructions, pamphlets, or other written materials from your doctor or pharmacy?: 3 - Sometimes What is the last grade level you completed in school?: some college  Diabetic? Yes Nutrition Risk Assessment:  Has the patient had any N/V/D within the last 2 months?  No  Does the patient have any non-healing wounds?  No  Has the patient had any unintentional weight loss or weight gain?  No   Diabetes:  Is the patient diabetic?  Yes  If diabetic, was a CBG obtained today?  No  Did the patient bring in their glucometer from home?  No  How often do you monitor your CBG's? daily.   Financial Strains and Diabetes Management:  Are you having any financial strains with the device, your supplies or your medication? No .  Does the patient want to be seen by Chronic Care Management for management of their diabetes?  No  Would the patient like to be referred to a Nutritionist or for Diabetic Management?  No   Diabetic Exams:  Diabetic Eye Exam: Completed 05/20/2021 Diabetic Foot Exam: Completed 05/22/2021   Interpreter Needed?: No  Information entered by :: NAllen LPN   Activities of Daily Living In your present state of health, do you have any difficulty performing the following  activities: 12/19/2021 12/15/2021  Hearing? Tempie Donning  Vision? Y Y  Comment blind in right eye and partially in the left -  Difficulty concentrating or making decisions? Tempie Donning  Walking or climbing stairs? Y Y  Dressing or bathing? Y Y  Doing errands, shopping? Tempie Donning  Preparing Food and eating ? N N  Using the Toilet? N N  In the past six months, have you accidently leaked urine? Y Y  Do you have problems with loss of bowel control? N N  Managing your Medications? N N  Managing your Finances? N N  Housekeeping or managing your Housekeeping? Tempie Donning  Some recent data might be hidden    Patient Care Team: Tysinger, Camelia Eng, PA-C as PCP - General (Family Medicine) Minus Breeding, MD as PCP - Cardiology (Cardiology) Sherrlyn Hock, MD as Consulting Physician Minus Breeding, MD as Consulting Physician (Cardiology)  Indicate any recent Medical Services you may have received from other than [facility] providers in the past year (date may be approximate).     Assessment:   This is a routine wellness examination for Kahmya.  Hearing/Vision screen Vision Screening - Comments:: Regular eye exams, Dr. Murvin Natal  Dietary issues and exercise activities discussed: Current Exercise Habits: The patient does not participate in regular exercise at present   Goals Addressed             This Visit's Progress    Patient Stated  12/19/2021, wants to be able to walk straight       Depression Screen PHQ 2/9 Scores 12/19/2021 05/22/2021 01/28/2021 11/16/2019 03/31/2018 02/19/2016 06/27/2014  PHQ - 2 Score 0 0 0 0 0 0 0    Fall Risk Fall Risk  12/19/2021 12/15/2021 09/01/2021 05/22/2021 11/16/2019  Falls in the past year? _0 0 0  Comment fell going down stairs, tripped and hit piano - - - -  Number falls in past yr: 1 1 0 0 -  Comment - - - - -  Injury with Fall? 0 0 1 0 -  Risk for fall due to : Impaired vision;Impaired mobility;Medication side effect;History of fall(s) - Other (Comment) No Fall Risks -  Risk for  fall due to: Comment - - due to ocean - -  Follow up Falls evaluation completed;Education provided;Falls prevention discussed - Falls evaluation completed Falls evaluation completed -    FALL RISK PREVENTION PERTAINING TO THE HOME:  Any stairs in or around the home? Yes  If so, are there any without handrails? No  Home free of loose throw rugs in walkways, pet beds, electrical cords, etc? Yes  Adequate lighting in your home to reduce risk of falls? Yes   ASSISTIVE DEVICES UTILIZED TO PREVENT FALLS:  Life alert? No  Use of a cane, walker or w/c? Yes  Grab bars in the bathroom? Yes  Shower chair or bench in shower? Yes  Elevated toilet seat or a handicapped toilet? No   TIMED UP AND GO:  Was the test performed? No .       Cognitive Function:     6CIT Screen 12/19/2021  What Year? 0 points  What month? 0 points  What time? 0 points  Count back from 20 0 points  Months in reverse 0 points  Repeat phrase 0 points  Total Score 0    Immunizations Immunization History  Administered Date(s) Administered   Fluad Quad(high Dose 65+) 07/17/2019, 07/22/2020, 09/01/2021   Hepatitis B, adult 06/23/2013, 07/24/2013, 04/05/2014   Influenza Split 08/22/2007, 07/22/2011, 08/12/2012   Influenza, High Dose Seasonal PF 07/26/2014, 08/06/2015, 08/03/2016, 08/12/2017, 08/05/2018   Influenza,inj,Quad PF,6+ Mos 07/24/2013   Pneumococcal Conjugate-13 04/05/2014   Pneumococcal Polysaccharide-23 08/25/2007, 10/20/2007, 04/04/2008, 08/08/2008, 03/31/2018   Tdap 10/19/2005, 08/22/2007   Zoster, Live 10/20/2007, 04/04/2008    TDAP status: Due, Education has been provided regarding the importance of this vaccine. Advised may receive this vaccine at local pharmacy or Health Dept. Aware to provide a copy of the vaccination record if obtained from local pharmacy or Health Dept. Verbalized acceptance and understanding.  Flu Vaccine status: Up to date  Pneumococcal vaccine status: Up to  date  Covid-19 vaccine status: Declined, Education has been provided regarding the importance of this vaccine but patient still declined. Advised may receive this vaccine at local pharmacy or Health Dept.or vaccine clinic. Aware to provide a copy of the vaccination record if obtained from local pharmacy or Health Dept. Verbalized acceptance and understanding.  Qualifies for Shingles Vaccine? Yes   Zostavax completed Yes   Shingrix Completed?: No.    Education has been provided regarding the importance of this vaccine. Patient has been advised to call insurance company to determine out of pocket expense if they have not yet received this vaccine. Advised may also receive vaccine at local pharmacy or Health Dept. Verbalized acceptance and understanding.  Screening Tests Health Maintenance  Topic Date Due   COVID-19 Vaccine (1) Never done   Zoster Vaccines-  Shingrix (1 of 2) Never done   HEMOGLOBIN A1C  03/01/2022   OPHTHALMOLOGY EXAM  05/20/2022   FOOT EXAM  05/22/2022   COLONOSCOPY (Pts 45-2yr Insurance coverage will need to be confirmed)  08/22/2023   Pneumonia Vaccine 76 Years old  Completed   INFLUENZA VACCINE  Completed   DEXA SCAN  Completed   Hepatitis C Screening  Completed   HPV VACCINES  Aged Out   TETANUS/TDAP  Discontinued    Health Maintenance  Health Maintenance Due  Topic Date Due   COVID-19 Vaccine (1) Never done   Zoster Vaccines- Shingrix (1 of 2) Never done    Colorectal cancer screening: Type of screening: Colonoscopy. Completed 08/21/2013. Repeat every 10 years  Mammogram status: No longer required due to age.  Bone Density status: Completed 01/23/2021.   Lung Cancer Screening: (Low Dose CT Chest recommended if Age 76-80years, 30 pack-year currently smoking OR have quit w/in 15years.) does not qualify.   Lung Cancer Screening Referral: no  Additional Screening:  Hepatitis C Screening: does qualify; Completed 08/05/2018  Vision Screening: Recommended  annual ophthalmology exams for early detection of glaucoma and other disorders of the eye. Is the patient up to date with their annual eye exam?  Yes  Who is the provider or what is the name of the office in which the patient attends annual eye exams? Dr. BMurvin NatalIf pt is not established with a provider, would they like to be referred to a provider to establish care? No .   Dental Screening: Recommended annual dental exams for proper oral hygiene  Community Resource Referral / Chronic Care Management: CRR required this visit?  Yes   CCM required this visit?  Yes      Plan:     I have personally reviewed and noted the following in the patients chart:   Medical and social history Use of alcohol, tobacco or illicit drugs  Current medications and supplements including opioid prescriptions.  Functional ability and status Nutritional status Physical activity Advanced directives List of other physicians Hospitalizations, surgeries, and ER visits in previous 12 months Vitals Screenings to include cognitive, depression, and falls Referrals and appointments  In addition, I have reviewed and discussed with patient certain preventive protocols, quality metrics, and best practice recommendations. A written personalized care plan for preventive services as well as general preventive health recommendations were provided to patient.     NKellie Simmering LPN   31/04/6159  Nurse Notes: Patient would like an order for an electric wheelchair or scooter.

## 2021-12-21 ENCOUNTER — Ambulatory Visit (INDEPENDENT_AMBULATORY_CARE_PROVIDER_SITE_OTHER): Payer: 59

## 2021-12-21 DIAGNOSIS — R002 Palpitations: Secondary | ICD-10-CM | POA: Diagnosis not present

## 2021-12-21 DIAGNOSIS — I499 Cardiac arrhythmia, unspecified: Secondary | ICD-10-CM

## 2021-12-23 ENCOUNTER — Telehealth: Payer: Self-pay | Admitting: *Deleted

## 2021-12-23 ENCOUNTER — Ambulatory Visit: Payer: 59 | Admitting: Cardiology

## 2021-12-23 IMAGING — DX DG ABDOMEN 1V
1 series · 1 of 1 positions shown · non-contrast
Comparison: None.

CLINICAL DATA: Upper abdominal pain.

EXAM:
ABDOMEN - 1 VIEW

[abdomen kub]
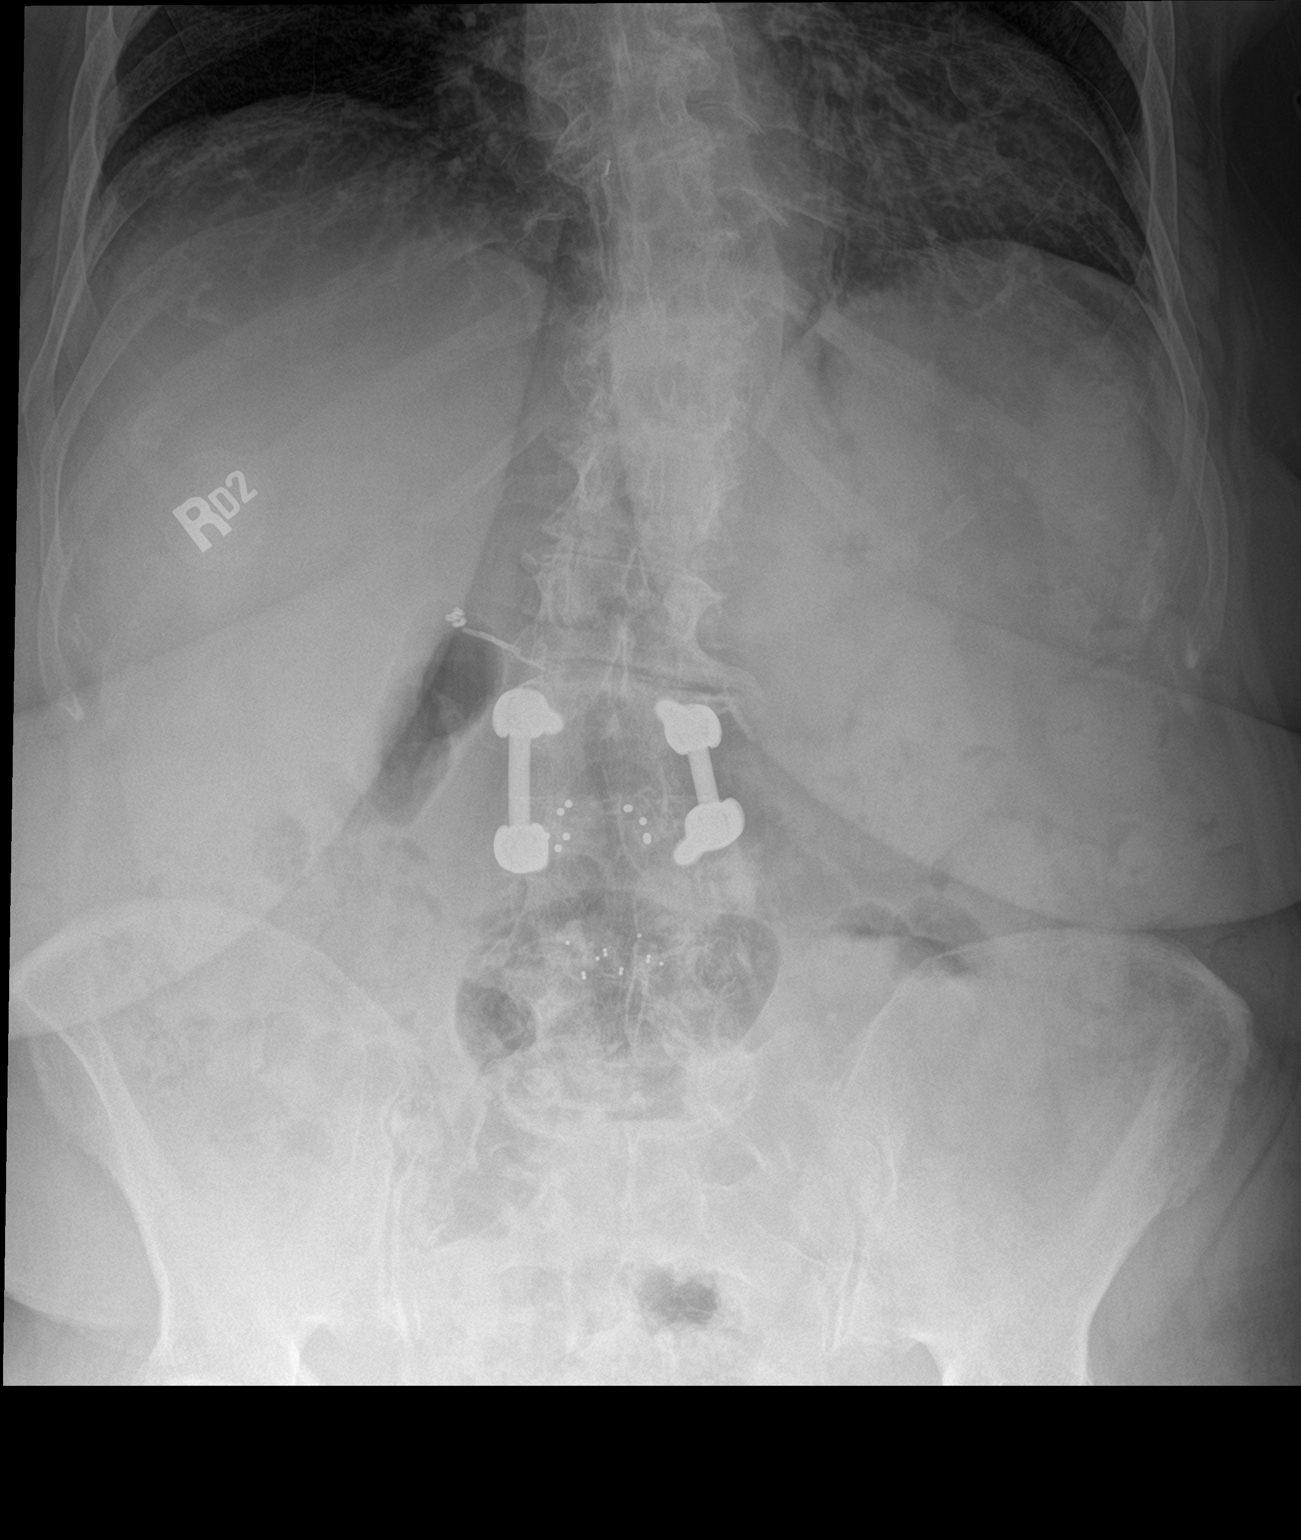

[1 of 1 positions shown; findings below may reference images not displayed]

FINDINGS: Large colonic stool burden without evidence of enteric obstruction.
No pneumoperitoneum, pneumatosis or portal venous gas.

No definitive abnormal intra-abdominal calcifications given
overlying colonic stool burden.

Post cholecystectomy.

Post lumbar paraspinal fusion intervertebral disc space replacement,
incompletely evaluated. Moderate scoliotic curvature of the
thoracolumbar spine with associated moderate to severe multilevel
DDD, incompletely evaluated.
IMPRESSION: Large colonic stool burden without evidence of enteric obstruction.

## 2021-12-23 NOTE — Telephone Encounter (Signed)
? ?  Telephone encounter was:  Unsuccessful.  12/23/2021 ?Name: Robyn Hill MRN: [id number] DOB: 03-Dec-1945 ? ?Unsuccessful outbound call made today to assist with:   Cellphones and hearing aids  ? ?Outreach Attempt:  1st Attempt ? ?A HIPAA compliant voice message was left requesting a return call.  Instructed patient to call back at   Instructed patient to call back at [phone]  at their earliest convenience. . ? ?Daily Doe Greenauer -Selinda Eon ?Care Guide , Embedded Care Coordination ?[employer], Care Management  ?[phone] ?[address] , [place] [address] ?Email : [name] [email or web address] ?  ? ?

## 2021-12-25 ENCOUNTER — Telehealth: Payer: Self-pay | Admitting: Orthopaedic Surgery

## 2021-12-25 ENCOUNTER — Telehealth: Payer: Self-pay

## 2021-12-25 NOTE — Telephone Encounter (Signed)
Dr. Leonette Nutting office was called and spoke with Varney Biles and she will put a message back to find out if he will be willing to write for a wheel chair or scooter. They will call back and advise.  ? ?Blima Singer RMA ?

## 2021-12-25 NOTE — Telephone Encounter (Signed)
Pt call back to advise she will work on her end to get forms for wheel chair or scooter. Usually pt has to have a home health eval and they will request a script to start this process. However is pt is already doing O/T and P/T requested by Dr. Rudene Anda office. No recent home health eval in pt record. Please advise if we need an appointment to do referral to home health. kh ?

## 2021-12-25 NOTE — Telephone Encounter (Signed)
Spoke with patient and relayed information to patient. She said that her PCP will not prescribe it for her because "it is to much paperwork". ?She is going to contact her pain management doctor.  ?

## 2021-12-25 NOTE — Telephone Encounter (Signed)
I have been evaluating shoulder pain which would not qualify as a need for an electric wheelchair-check with primary care physician.

## 2021-12-25 NOTE — Telephone Encounter (Signed)
Patient is requesting a motorized wheelchair or electric scooter. She has fallen again. Her call back number is [phone] ?

## 2021-12-29 ENCOUNTER — Encounter: Payer: Self-pay | Admitting: Cardiology

## 2021-12-30 ENCOUNTER — Telehealth: Payer: Self-pay | Admitting: *Deleted

## 2021-12-30 NOTE — Telephone Encounter (Signed)
? ?  Telephone encounter was:  Successful.  ?12/30/2021 ?Name: CLOEY SFERRAZZA MRN: [id number] DOB: [birth year] ? ?ZERLINE MELCHIOR is a 76 y.o. year old female who is a primary care patient of Caryl Ada . The community resource team was consulted for assistance with Caregiver Stress and cell phones ans hearing aids ? ?Care guide performed the following interventions: Patient provided with information about care guide support team and interviewed to confirm resource needs ?Follow up call placed to community resources to determine status of patients referral. ? ?Will send patients daughter on hearing aids, [place] deaf and hard of hearing and cell phone plans senior reources of [place] as well special assistance program  ? ? ?Follow Up Plan:  No further follow up planned at this time. The patient has been provided with needed resources. ? ?Lovett Sox -Selinda Eon ?Care Guide , Embedded Care Coordination ?[place], Care Management  ?[phone] ?[address] , [place] [address] ?Email : [name] [email or web address] ?  ?

## 2021-12-31 ENCOUNTER — Other Ambulatory Visit: Payer: Self-pay | Admitting: Medical

## 2022-01-01 ENCOUNTER — Other Ambulatory Visit: Payer: Self-pay | Admitting: Medical

## 2022-01-01 ENCOUNTER — Telehealth: Payer: Self-pay | Admitting: *Deleted

## 2022-01-01 NOTE — Telephone Encounter (Signed)
? ?  Telephone encounter was:  Successful.  ?01/01/2022 ?Name: Robyn Hill MRN: [id number] DOB: 05-25-1946 ? ?Robyn Hill is a 76 y.o. year old female who is a primary care patient of Caryl Ada . The community resource team was consulted for assistance with Food Insecurity, Home Modifications, and Caregiver Stress ? ?Care guide performed the following interventions: Follow up call placed to the patient to discuss status of referral. ? ?Follow Up Plan:  No further follow up planned at this time. The patient has been provided with needed resources. ? ?Lovett Sox -Selinda Eon ?Care Guide , Embedded Care Coordination ?[facility], Care Management  ?[phone] ?[address] , [place] [address] ?Email : [name] [email or web address] ? ? ? ?

## 2022-01-05 ENCOUNTER — Telehealth: Payer: Self-pay | Admitting: *Deleted

## 2022-01-05 NOTE — Telephone Encounter (Signed)
? ?  Telephone encounter was:  Successful.  ?01/05/2022 ?Name: Robyn Hill MRN: [id number] DOB: 23-Feb-1946 ? ?Robyn Hill is a 76 y.o. year old female who is a primary care patient of Caryl Ada . The community resource team was consulted for assistance with  Hearing aides ,cell phone and [place] resources where provided  ? ?Care guide performed the following interventions: Patient provided with information about care guide support team and interviewed to confirm resource needs ?Follow up call placed to the patient to discuss status of referral. ? ?Follow Up Plan:  No further follow up planned at this time. The patient has been provided with needed resources. ? ?Lovett Sox -Selinda Eon ?Care Guide , Embedded Care Coordination ?[place], Care Management  ?[phone] ?[address] , [place] [address] ?Email : [name] ?  ?

## 2022-01-07 ENCOUNTER — Ambulatory Visit (HOSPITAL_COMMUNITY): Payer: 59

## 2022-01-09 ENCOUNTER — Encounter: Payer: Self-pay | Admitting: Cardiology

## 2022-01-09 ENCOUNTER — Other Ambulatory Visit: Payer: Self-pay | Admitting: Medical

## 2022-01-12 DIAGNOSIS — R002 Palpitations: Secondary | ICD-10-CM | POA: Insufficient documentation

## 2022-01-12 DIAGNOSIS — I6523 Occlusion and stenosis of bilateral carotid arteries: Secondary | ICD-10-CM | POA: Insufficient documentation

## 2022-01-12 NOTE — Progress Notes (Signed)
?  ?Cardiology Office Note ? ? ?Date:  01/13/2022  ? ?ID:  Robyn Hill, DOB May 18, 1946, MRN [id number] ? ?PCP:  Carlena Hurl, PA-C  ?Cardiologist:   Minus Breeding, MD  ? ? ?Chief Complaint  ?Patient presents with  ? Fatigue  ? ? ?  ?History of Present Illness: ?Robyn Hill is a 76 y.o. female who presents for evaluation of possible atrial fib. When I saw her in the office for evaluation of this possibly seen on an EKG at her primary care office I thought that this was PAT.  She wore a monitor.   I did not see atrial fib on this monitor.   ? ?However, today I did review her Apple Watch.  At this time, as opposed to previously, I would agree that the rhythm is atrial fibrillation.  [name] has a CHA2DS2 - VASc score of 4.     Her biggest complaint continues to be fatigue in the morning.  She does not wake up in the morning.  Her daughter also shows me her monitor and she has heart rates that ranged between 50 and 140 with not infrequent spikes.  Is not clear that her fatigue or other symptoms correlate with this.   ? ? ?Past Medical History:  ?Diagnosis Date  ? Adrenal mass, right ([place])   ? Allergy   ? Anemia   ? Asthma   ? "as a teenager"  ? Blind right eye   ? since childhood  ? Blood transfusion 1970  ? Chronic back pain   ? Chronic diarrhea   ? Colestipol therapy  ? Depression   ? Diverticulitis   ? Diverticulosis   ? Fatty liver   ? Female bladder prolapse   ? GERD (gastroesophageal reflux disease)   ? Glaucoma   ? H/O bone density study 11/29/2014  ? normal study although mild decreased in density from prior study  ? H/O hiatal hernia   ? Hemorrhoids   ? History of kidney stones   ? History of uterine cancer   ? s/p hysterectomy  ? Hyperlipidemia   ? Hypertension   ? Migraine   ? hx of migraines in past   ? Neuromuscular disorder ([place])   ? diabetic neuropahthy  ? Numbness and tingling of both legs   ? Osteoarthritis   ? Rheumatic fever   ? age 17  ? Type II diabetes mellitus ([place])   ? Urinary  incontinence   ? Vitamin D deficiency   ? ? ?Past Surgical History:  ?Procedure Laterality Date  ? ABDOMINAL HYSTERECTOMY  1980  ? no further pap smears needed  ? ANTERIOR AND POSTERIOR REPAIR N/A 01/29/2015  ? Procedure: CYSTOCELE AND RECTOCELE ;  Surgeon: Bjorn Loser, MD;  Location: [facility];  Service: Urology;  Laterality: N/A;  ? BACK SURGERY    ? BREAST BIOPSY  08/1997  ? right  ? CARDIAC CATHETERIZATION  11/2006  ? CHOLECYSTECTOMY    ? COLONOSCOPY  2014  ? polyps, external hemorrhoids  ? CYSTOSCOPY N/A 01/29/2015  ? Procedure: CYSTOSCOPY;  Surgeon: Bjorn Loser, MD;  Location: [facility];  Service: Urology;  Laterality: N/A;  ? [place] OF UTERUS  1980  ? EDSI for back pain  last 01/2009  ? multiple x 5  ? EYE SURGERY    ? Bil Blind R eye  ? [place]  ? LIVER BIOPSY  04/1999  ? minor laceration repair    ?  right eye  ? POSTERIOR FUSION LUMBAR SPINE  04/05/12  ? PULSE GENERATOR IMPLANT Right 03/12/2016  ? Procedure: Right Reposition of implantable pulse generator;  Surgeon: Erline Levine, MD;  Location: [facility];  Service: Neurosurgery;  Laterality: Right;  Right Reposition of implantable pulse generator  ? SPINAL CORD STIMULATOR INSERTION N/A 09/06/2015  ? Procedure: LUMBAR SPINAL CORD STIMULATOR INSERTION;  Surgeon: Clydell Hakim, MD;  Location: [facility];  Service: Neurosurgery;  Laterality: N/A;  Spinal Cord Stimulator placement  ? SPINAL CORD STIMULATOR INSERTION N/A 10/22/2015  ? Procedure: Laminectomy for spinal cord stimulator paddle lead and implantable generator placement;  Surgeon: Erline Levine, MD;  Location: [facility];  Service: Neurosurgery;  Laterality: N/A;  Laminectomy for spinal cord stimulator paddle lead and implantable generator placement  ? steroid injections of hip  last 02/2009  ? multiple x 4  ? [place]  ? TRABECULECTOMY  1996  ? bilaterally; "for glaucoma"  ? UPPER GI ENDOSCOPY  05/21/14  ? food, retained in the body of the  stomach  ? VAGINAL PROLAPSE REPAIR N/A 01/29/2015  ? Procedure:  VAULT PROLAPSE WITH GRAFT ;  Surgeon: Bjorn Loser, MD;  Location: [facility];  Service: Urology;  Laterality: N/A;  ? ? ? ?Current Outpatient Medications  ?Medication Sig Dispense Refill  ? Accu-Chek FastClix Lancets MISC USE 1 STRIP TO CHECK GLUCOSE TWICE DAILY 102 each 0  ? ACCU-CHEK GUIDE test strip USE TO CHECK BLOOD GLUCOSE TWICE DAILY 150 each 0  ? apixaban (ELIQUIS) 5 MG TABS tablet Take 1 tablet (5 mg total) by mouth 2 (two) times daily. 60 tablet 11  ? atorvastatin (LIPITOR) 40 MG tablet TAKE 1 TABLET BY MOUTH AT BEDTIME 90 tablet 0  ? B-D UF III MINI PEN NEEDLES 31G X 5 MM MISC USE AS DIRECTED TO INJECT TOUJEO 100 each 0  ? Blood Glucose Calibration (ACCU-CHEK INSTANT CONTROL) LIQD Use for controls for accu-chek activa meter 1 each 0  ? Blood Glucose Monitoring Suppl (ACCU-CHEK GUIDE ME) w/Device KIT 1 Device by Does not apply route 2 (two) times daily at 8 am and 10 pm. 1 kit 0  ? brimonidine (ALPHAGAN) 0.2 % ophthalmic solution Place 1 drop into the left eye 2 times daily.    ? celecoxib (CELEBREX) 200 MG capsule Take 1 capsule (200 mg total) by mouth daily. 30 capsule 2  ? cetirizine (ZYRTEC) 10 MG tablet Take 10 mg by mouth daily.    ? Cholecalciferol (VITAMIN D-3) 25 MCG (1000 UT) CAPS Take 1 capsule (1,000 Units total) by mouth daily. 90 capsule 3  ? dexlansoprazole (DEXILANT) 60 MG capsule Take 1 capsule (60 mg total) by mouth 2 (two) times daily. 180 capsule 3  ? dorzolamide-timolol (COSOPT) 22.3-6.8 MG/ML ophthalmic solution Place 1 drop into both eyes 2 (two) times daily.    ? DULoxetine (CYMBALTA) 60 MG capsule Take 1 capsule by mouth once daily 90 capsule 0  ? enalapril (VASOTEC) 20 MG tablet Take 1 tablet by mouth once daily 90 tablet 0  ? ferrous gluconate (FERGON) 324 MG tablet Take 1 tablet by mouth once daily with breakfast 90 tablet 0  ? hydrochlorothiazide (HYDRODIURIL) 25 MG tablet Take 1 tablet (25 mg total) by mouth  daily. 90 tablet 3  ? HYDROmorphone (DILAUDID) 4 MG tablet Take 4 mg by mouth every 6 (six) hours as needed.    ? latanoprost (XALATAN) 0.005 % ophthalmic solution Place 1 drop into the right eye  at bedtime.    ? levETIRAcetam (KEPPRA) 250 MG tablet Take 250 mg by mouth 2 (two) times daily.    ? metFORMIN (GLUCOPHAGE) 500 MG tablet Take 1 tablet (500 mg total) by mouth 2 (two) times daily with a meal. 180 tablet 3  ? metoprolol tartrate (LOPRESSOR) 25 MG tablet Take 0.5 tablets (12.5 mg total) by mouth 2 (two) times daily. 30 tablet 11  ? PROCTO-MED HC 2.5 % rectal cream APPLY CREAM RECTALLY TWICE DAILY 28 g 0  ? tirzepatide (MOUNJARO) 5 MG/0.5ML Pen Inject 5 mg into the skin once a week. 2 mL 2  ? triamcinolone (KENALOG) 0.1 % Apply topically.    ? insulin glargine, 1 Unit Dial, (TOUJEO SOLOSTAR) 300 UNIT/ML Solostar Pen Inject 33 Units into the skin daily. (Patient not taking: Reported on 01/13/2022) 15 mL 3  ? nortriptyline (PAMELOR) 10 MG capsule Take 10-20 mg by mouth at bedtime. (Patient not taking: Reported on 01/13/2022)    ? sulfamethoxazole-trimethoprim (BACTRIM DS) 800-160 MG tablet Take 1 tablet by mouth 2 (two) times daily. (Patient not taking: Reported on 01/13/2022)    ? ?No current facility-administered medications for this visit.  ? ? ?Allergies:   Kiwi extract, Lyrica [pregabalin], Sunflowerseed oil, Acetaminophen, Oxycodone hcl, Hydrocodone-acetaminophen, Invokana [canagliflozin], Neurontin [gabapentin], Nitrofuran derivatives, and Tape  ? ?Family History  ?Problem Relation Age of Onset  ? Heart attack Brother 41  ? Hypertension Other   ? Diabetes Other   ? Cancer Mother 53  ? Aplastic anemia Mother 61  ? Heart attack Brother 36  ? Colon cancer Brother   ? Heart attack Sister   ? Heart attack Brother 22  ? Anesthesia problems Neg Hx   ? ?Social History  ? ?Socioeconomic History  ? Marital status: Widowed  ?  Spouse name: Lake Bells   ? Number of children: 3  ? Years of education: Not on file  ? Highest  education level: Some college, no degree  ?Occupational History  ? Occupation: Retired   ?Tobacco Use  ? Smoking status: Never  ? Smokeless tobacco: Never  ?Vaping Use  ? Vaping Use: Never used  ?Substance

## 2022-01-13 ENCOUNTER — Telehealth: Payer: Self-pay

## 2022-01-13 ENCOUNTER — Ambulatory Visit: Payer: 59 | Admitting: Orthopaedic Surgery

## 2022-01-13 ENCOUNTER — Encounter: Payer: Self-pay | Admitting: Cardiology

## 2022-01-13 ENCOUNTER — Other Ambulatory Visit: Payer: Self-pay

## 2022-01-13 ENCOUNTER — Ambulatory Visit (INDEPENDENT_AMBULATORY_CARE_PROVIDER_SITE_OTHER): Payer: 59 | Admitting: Cardiology

## 2022-01-13 VITALS — BP 121/73 | HR 102 | Ht 65.0 in | Wt 190.4 lb

## 2022-01-13 DIAGNOSIS — I6523 Occlusion and stenosis of bilateral carotid arteries: Secondary | ICD-10-CM | POA: Diagnosis not present

## 2022-01-13 DIAGNOSIS — R002 Palpitations: Secondary | ICD-10-CM

## 2022-01-13 DIAGNOSIS — E118 Type 2 diabetes mellitus with unspecified complications: Secondary | ICD-10-CM

## 2022-01-13 DIAGNOSIS — R011 Cardiac murmur, unspecified: Secondary | ICD-10-CM | POA: Diagnosis not present

## 2022-01-13 MED ORDER — METOPROLOL TARTRATE 25 MG PO TABS: 12.5000 mg | ORAL_TABLET | Freq: Two times a day (BID) | ORAL | 11 refills | Status: DC

## 2022-01-13 MED ORDER — APIXABAN 5 MG PO TABS: 5.0000 mg | ORAL_TABLET | Freq: Two times a day (BID) | ORAL | 11 refills | Status: DC

## 2022-01-13 NOTE — Telephone Encounter (Signed)
Called and left a VM for patient to CB concerning visit on 12/16/2021. ?

## 2022-01-13 NOTE — Patient Instructions (Signed)
Medication Instructions:  ?START Eliquis 5 mg twice daily ?START Metoprolol Tartrate 12.5 mg twice daily ? ?*If you need a refill on your cardiac medications before your next appointment, please call your pharmacy* ? ? ?Lab Work: ?None ordered ?If you have labs (blood work) drawn today and your tests are completely normal, you will receive your results only by: ?MyChart Message (if you have MyChart) OR ?A paper copy in the mail ?If you have any lab test that is abnormal or we need to change your treatment, we will call you to review the results. ? ? ?Testing/Procedures: ?Your physician has requested that you have an echocardiogram. Echocardiography is a painless test that uses sound waves to create images of your heart. It provides your doctor with information about the size and shape of your heart and how well your heart?s chambers and valves are working. You may receive an ultrasound enhancing agent through an IV if needed to better visualize your heart during the echo.This procedure takes approximately one hour. There are no restrictions for this procedure. This will take place at the [address].  ? ? ? ?Follow-Up: ?At [facility], you and your health needs are our priority.  As part of our continuing mission to provide you with exceptional heart care, we have created designated Provider Care Teams.  These Care Teams include your primary Cardiologist (physician) and Advanced Practice Providers (APPs -  Physician Assistants and Nurse Practitioners) who all work together to provide you with the care you need, when you need it. ? ?We recommend signing up for the patient portal called "MyChart".  Sign up information is provided on this After Visit Summary.  MyChart is used to connect with patients for Virtual Visits (Telemedicine).  Patients are able to view lab/test results, encounter notes, upcoming appointments, etc.  Non-urgent messages can be sent to your provider as well.   ?To learn more  about what you can do with MyChart, go to [email or web address].   ? ?Your next appointment:   ?12 month(s) ? ?The format for your next appointment:   ?In Person ? ?Provider:   ?Minus Breeding, MD { ? ?

## 2022-01-15 ENCOUNTER — Other Ambulatory Visit (HOSPITAL_COMMUNITY): Payer: Self-pay | Admitting: Neurosurgery

## 2022-01-15 DIAGNOSIS — M961 Postlaminectomy syndrome, not elsewhere classified: Secondary | ICD-10-CM

## 2022-01-16 LAB — HM DIABETES EYE EXAM

## 2022-02-03 ENCOUNTER — Other Ambulatory Visit: Payer: Self-pay | Admitting: Medical

## 2022-02-03 ENCOUNTER — Ambulatory Visit (HOSPITAL_COMMUNITY)
Admission: RE | Admit: 2022-02-03 | Discharge: 2022-02-03 | Disposition: A | Discharge status: Home / Self Care | Payer: 59 | Source: Ambulatory Visit | Attending: Neurosurgery | Admitting: Neurosurgery

## 2022-02-03 DIAGNOSIS — M961 Postlaminectomy syndrome, not elsewhere classified: Secondary | ICD-10-CM | POA: Insufficient documentation

## 2022-02-03 MED ORDER — TRULICITY 3 MG/0.5ML ~~LOC~~ SOAJ: 3.0000 mg | SUBCUTANEOUS | 2 refills | Status: DC

## 2022-02-05 ENCOUNTER — Ambulatory Visit (INDEPENDENT_AMBULATORY_CARE_PROVIDER_SITE_OTHER): Payer: 59

## 2022-02-05 DIAGNOSIS — R011 Cardiac murmur, unspecified: Secondary | ICD-10-CM | POA: Diagnosis not present

## 2022-02-05 LAB — ECHOCARDIOGRAM COMPLETE
AR max vel: 2.27 cm2
AV Area VTI: 2.35 cm2
AV Area mean vel: 2.08 cm2
AV Mean grad: 3 mmHg
AV Peak grad: 6.5 mmHg
Ao pk vel: 1.27 m/s
Area-P 1/2: 3.08 cm2
Calc EF: 65.5 %
S' Lateral: 2.27 cm
Single Plane A2C EF: 62.5 %
Single Plane A4C EF: 68.3 %

## 2022-02-05 MED ORDER — ATORVASTATIN CALCIUM 40 MG PO TABS: 40.0000 mg | ORAL_TABLET | Freq: Every day | ORAL | 1 refills | Status: DC

## 2022-02-09 ENCOUNTER — Encounter: Payer: Self-pay | Admitting: Cardiology

## 2022-02-09 ENCOUNTER — Telehealth: Payer: Self-pay

## 2022-02-09 NOTE — Telephone Encounter (Signed)
Talked with patient concerning DME that she received on 12/16/2021. Advised patient that she needs to stop by to sign tablet and to ask for Myron Stankovich J.  Patient voiced that she understands and will come by the office.  ?

## 2022-02-10 MED ORDER — DULOXETINE HCL 60 MG PO CPEP: 60.0000 mg | ORAL_CAPSULE | Freq: Every day | ORAL | 0 refills | Status: DC

## 2022-02-11 ENCOUNTER — Encounter: Payer: Self-pay | Admitting: Internal Medicine

## 2022-02-13 ENCOUNTER — Encounter: Payer: Self-pay | Admitting: Internal Medicine

## 2022-02-16 ENCOUNTER — Ambulatory Visit (INDEPENDENT_AMBULATORY_CARE_PROVIDER_SITE_OTHER): Payer: 59 | Admitting: Medical

## 2022-02-16 VITALS — BP 122/76 | HR 110 | Wt 190.8 lb

## 2022-02-16 DIAGNOSIS — R195 Other fecal abnormalities: Secondary | ICD-10-CM

## 2022-02-16 DIAGNOSIS — R4781 Slurred speech: Secondary | ICD-10-CM

## 2022-02-16 DIAGNOSIS — R519 Headache, unspecified: Secondary | ICD-10-CM

## 2022-02-16 DIAGNOSIS — I10 Essential (primary) hypertension: Secondary | ICD-10-CM

## 2022-02-16 DIAGNOSIS — R531 Weakness: Secondary | ICD-10-CM

## 2022-02-16 DIAGNOSIS — Z7185 Encounter for immunization safety counseling: Secondary | ICD-10-CM

## 2022-02-16 DIAGNOSIS — E785 Hyperlipidemia, unspecified: Secondary | ICD-10-CM

## 2022-02-16 DIAGNOSIS — E118 Type 2 diabetes mellitus with unspecified complications: Secondary | ICD-10-CM | POA: Diagnosis not present

## 2022-02-16 DIAGNOSIS — M5441 Lumbago with sciatica, right side: Secondary | ICD-10-CM

## 2022-02-16 DIAGNOSIS — R002 Palpitations: Secondary | ICD-10-CM | POA: Diagnosis not present

## 2022-02-16 DIAGNOSIS — D638 Anemia in other chronic diseases classified elsewhere: Secondary | ICD-10-CM

## 2022-02-16 DIAGNOSIS — R000 Tachycardia, unspecified: Secondary | ICD-10-CM | POA: Diagnosis not present

## 2022-02-16 DIAGNOSIS — I700 Atherosclerosis of aorta: Secondary | ICD-10-CM

## 2022-02-16 DIAGNOSIS — G479 Sleep disorder, unspecified: Secondary | ICD-10-CM

## 2022-02-16 DIAGNOSIS — R270 Ataxia, unspecified: Secondary | ICD-10-CM | POA: Diagnosis not present

## 2022-02-16 DIAGNOSIS — E1169 Type 2 diabetes mellitus with other specified complication: Secondary | ICD-10-CM

## 2022-02-16 DIAGNOSIS — I499 Cardiac arrhythmia, unspecified: Secondary | ICD-10-CM

## 2022-02-16 DIAGNOSIS — K649 Unspecified hemorrhoids: Secondary | ICD-10-CM

## 2022-02-16 LAB — POCT GLYCOSYLATED HEMOGLOBIN (HGB A1C): Hemoglobin A1C: 8.2 % — AB (ref 4.0–5.6)

## 2022-02-16 NOTE — Assessment & Plan Note (Signed)
consider SITZ baths.  Continue Proctosol cream prn.  Consider cutting back on Trulicity to see if this helps with some of the GI symptoms. ?

## 2022-02-16 NOTE — Telephone Encounter (Signed)
Hilda Blades RN has contacted patient about an appointment ?

## 2022-02-16 NOTE — Assessment & Plan Note (Signed)
Not controlled on current regimen.   Follow up with pain management/neurosurgery ?

## 2022-02-16 NOTE — Progress Notes (Signed)
Subjective: ?Chief Complaint  ?Patient presents with  ? diabetes check  ?  Diabetes check. Was given metoprolol and eliquis started march and bactrim was given last Wednesday but then last thursday ?Needs refills on meds  ? ?Here with daughter Lynelle Smoke.  Has several concerns ? ?Primary Care Provider ?Keaton Stirewalt, Camelia Eng, PA-C here for primary care ?  ?Current Health Care Team: ?Dentist, Dr. Joya Gaskins ?Dr. Sharyne Peach, [facility] ?Dr. Lenord Carbo, neurosurgery/pain management ?Dr. Minus Breeding, cardiology ?Dr. Erline Levine, [facility], [place], neurosurgery ?Dr. Derek Jack, hematology oncology ?Dr. Jerrell Belfast, ENT ?Dr. Sandford Craze, dermatology ?Dr. Servando Salina, gynecology ?Dr. Bjorn Loser, urology ?Dr. Acquanetta Sit, podiatry ? ?Diabetes ?Associated symptoms include visual change and weakness. Current diabetic treatments: Compliant with Toujeo 40 units daily, Trulicity weekly, metformin twice daily. Her home blood glucose trend is fluctuating dramatically (Ranging from 120-200+).  ?Back Pain ?Associated symptoms include weakness. Treatments tried: History of back surgery, currently on oxycodone q6hours, nortriptyline prn, Celebrex, although she has been limiting the Celebrex given use of Eliquis.  Nothing seems to help.  On Cymbalta as well.  not currently on Dilaudid.  ?Hypertension ?Associated symptoms include palpitations. There are no compliance problems (compliant with hydrochlorothiazide, enalapril, Toprol, however since metoprolol was added a few months ago she has been getting lower diastolic pressures, still having tachycardia and not feeling well).   ?Depression ?     (Lately having fairly frequent headaches, ongoing trouble with balance, seeing spots in her vision)  Compliance with prior treatments: Cymbalta helps with mood in general but does not help with pain. ?Palpitations  ?Episode onset: Recently got 1 week Holter test through cardiology.  No unusua rhythm noted.  Progression since onset: Was started on metoprolol and Eliquis a few months ago for concerns for A-fib.  Pulse rate still running high.  No chest pain.  Overall she notes fatigue, not feeling all that well. Associated symptoms include weakness.  ?Neurologic Problem ?The patient's primary symptoms include a loss of balance, slurred speech, a visual change and weakness. Primary symptoms comment: Lately having fairly frequent headaches, ongoing trouble with balance, seeing spots in her vision. The current episode started 1 to 4 weeks ago. The neurological problem developed gradually. The problem is unchanged (Also not sleeping well). There was no focality noted. Associated symptoms include back pain and palpitations. The treatment provided no relief.  ?Urinary Tract Infection  ?The current episode started 1 to 4 weeks ago (On doxycycline but did not tolerated that well). The problem has been resolved. Treatments tried: Saw urgent care recently was on antibiotic.  ?Diarrhea  ?This is a recurrent problem. The problem occurs 5 to 10 times per day. The problem has been unchanged (Not loose but quite soft and, no blood in the stool, no frank diarrhea). The patient states that diarrhea does not awaken her from sleep.  ?Gastroesophageal Reflux ?She complains of globus sensation. Lately having fairly frequent headaches, ongoing trouble with balance, seeing spots in her vision.  ?GI Problem ?The primary symptoms include diarrhea. Primary symptoms comment: Lately having fairly frequent headaches, ongoing trouble with balance, seeing spots in her vision.  ?The illness is also significant for back pain. Significant associated medical issues include GERD and hemorrhoids. Associated medical issues comments: lots of hemorrhoid problems of late.  ? ? ?Past Medical History:  ?Diagnosis Date  ? Adrenal mass, right ([place])   ? Allergy   ? Anemia   ? Asthma   ? "as a teenager"  ? Blind  right eye   ? since childhood  ? Blood transfusion 1970   ? Chronic back pain   ? Chronic diarrhea   ? Colestipol therapy  ? Depression   ? Diverticulitis   ? Diverticulosis   ? Fatty liver   ? Female bladder prolapse   ? GERD (gastroesophageal reflux disease)   ? Glaucoma   ? H/O bone density study 11/29/2014  ? normal study although mild decreased in density from prior study  ? H/O hiatal hernia   ? Hemorrhoids   ? History of kidney stones   ? History of uterine cancer   ? s/p hysterectomy  ? Hyperlipidemia   ? Hypertension   ? Migraine   ? hx of migraines in past   ? Neuromuscular disorder ([place])   ? diabetic neuropahthy  ? Numbness and tingling of both legs   ? Osteoarthritis   ? Rheumatic fever   ? age 76  ? Type II diabetes mellitus ([place])   ? Urinary incontinence   ? Vitamin D deficiency   ? ?Current Outpatient Medications on File Prior to Visit  ?Medication Sig Dispense Refill  ? Accu-Chek FastClix Lancets MISC USE 1 STRIP TO CHECK GLUCOSE TWICE DAILY 102 each 0  ? ACCU-CHEK GUIDE test strip USE TO CHECK BLOOD GLUCOSE TWICE DAILY 150 each 0  ? apixaban (ELIQUIS) 5 MG TABS tablet Take 1 tablet (5 mg total) by mouth 2 (two) times daily. 60 tablet 11  ? atorvastatin (LIPITOR) 40 MG tablet Take 1 tablet (40 mg total) by mouth at bedtime. 90 tablet 1  ? B-D UF III MINI PEN NEEDLES 31G X 5 MM MISC USE AS DIRECTED TO INJECT TOUJEO 100 each 0  ? brimonidine (ALPHAGAN) 0.2 % ophthalmic solution Place 1 drop into the left eye 2 times daily.    ? celecoxib (CELEBREX) 200 MG capsule Take 1 capsule (200 mg total) by mouth daily. 30 capsule 2  ? cetirizine (ZYRTEC) 10 MG tablet Take 10 mg by mouth daily.    ? Cholecalciferol (VITAMIN D-3) 25 MCG (1000 UT) CAPS Take 1 capsule (1,000 Units total) by mouth daily. 90 capsule 3  ? dexlansoprazole (DEXILANT) 60 MG capsule Take 1 capsule (60 mg total) by mouth 2 (two) times daily. 180 capsule 3  ? dorzolamide-timolol (COSOPT) 22.3-6.8 MG/ML ophthalmic solution Place 1 drop into both eyes 2 (two) times daily.    ? doxycycline (VIBRA-TABS)  100 MG tablet Take 100 mg by mouth 2 (two) times daily.    ? Dulaglutide (TRULICITY) 3 IZ/1.2WP SOPN Inject 3 mg as directed once a week. 0.5 mL 2  ? DULoxetine (CYMBALTA) 60 MG capsule Take 1 capsule (60 mg total) by mouth daily. 90 capsule 0  ? enalapril (VASOTEC) 20 MG tablet Take 1 tablet by mouth once daily 90 tablet 0  ? ferrous gluconate (FERGON) 324 MG tablet Take 1 tablet by mouth once daily with breakfast 90 tablet 0  ? hydrochlorothiazide (HYDRODIURIL) 25 MG tablet Take 1 tablet (25 mg total) by mouth daily. 90 tablet 3  ? insulin glargine, 1 Unit Dial, (TOUJEO SOLOSTAR) 300 UNIT/ML Solostar Pen Inject 33 Units into the skin daily. 15 mL 3  ? latanoprost (XALATAN) 0.005 % ophthalmic solution Place 1 drop into the right eye at bedtime.    ? metFORMIN (GLUCOPHAGE) 500 MG tablet Take 1 tablet (500 mg total) by mouth 2 (two) times daily with a meal. 180 tablet 3  ? metoprolol tartrate (LOPRESSOR) 25 MG tablet Take 0.5  tablets (12.5 mg total) by mouth 2 (two) times daily. 30 tablet 11  ? nortriptyline (PAMELOR) 10 MG capsule Take 10-20 mg by mouth at bedtime.    ? PROCTO-MED HC 2.5 % rectal cream APPLY CREAM RECTALLY TWICE DAILY 28 g 0  ? triamcinolone (KENALOG) 0.1 % Apply topically.    ? Blood Glucose Calibration (ACCU-CHEK INSTANT CONTROL) LIQD Use for controls for accu-chek activa meter 1 each 0  ? Blood Glucose Monitoring Suppl (ACCU-CHEK GUIDE ME) w/Device KIT 1 Device by Does not apply route 2 (two) times daily at 8 am and 10 pm. 1 kit 0  ? estradiol (ESTRACE) 0.1 MG/GM vaginal cream SMARTSIG:sparingly Vaginal Twice a Week    ? levETIRAcetam (KEPPRA) 250 MG tablet Take 250 mg by mouth 2 (two) times daily. (Patient not taking: Reported on 02/16/2022)    ? Oxycodone HCl 10 MG TABS Take 10 mg by mouth 4 (four) times daily.    ? sulfamethoxazole-trimethoprim (BACTRIM DS) 800-160 MG tablet Take 1 tablet by mouth 2 (two) times daily. (Patient not taking: Reported on 02/16/2022)    ? ?No current  facility-administered medications on file prior to visit.  ? ?ROS as in subjective ? ? ? ?Objective: ?BP 122/76   Pulse (!) 110   Wt 190 lb 12.8 oz (86.5 kg)   BMI 31.75 kg/m?  ? ?Wt Readings from Last 3 Encounters:  ?05

## 2022-02-16 NOTE — Assessment & Plan Note (Signed)
Continue statin, low cholesterol diet ?

## 2022-02-16 NOTE — Assessment & Plan Note (Signed)
Check with your pharmacy about updating tetanus and shingles boosters.   You are also due for updated Prevnar 20 vaccine. ?

## 2022-02-16 NOTE — Assessment & Plan Note (Signed)
Possible related to GLP1 medication.  Consider modifying regimen ?

## 2022-02-16 NOTE — Assessment & Plan Note (Signed)
Consult with cardiology.   On Enalapril, hydrochlorothiazide and most recently Metoprolol added.  May need to back off Metoprolol and consider other options such as Diltiazem or other ?

## 2022-02-16 NOTE — Assessment & Plan Note (Signed)
Follow up with cardiology.  Not tolerating metoprolol well.  Consider Diltiazem or other.   c ?

## 2022-02-16 NOTE — Assessment & Plan Note (Signed)
Labs today, and we will pursue head imaging.   ?

## 2022-02-16 NOTE — Assessment & Plan Note (Signed)
History of, on Eliquis.  Advised she needs to abstain from Celebrex ?

## 2022-02-16 NOTE — Assessment & Plan Note (Signed)
Continue statin. 

## 2022-02-17 ENCOUNTER — Other Ambulatory Visit: Payer: Self-pay | Admitting: Medical

## 2022-02-17 LAB — IRON,TIBC AND FERRITIN PANEL
Ferritin: 85 ng/mL (ref 15–150)
Iron Saturation: 22 % (ref 15–55)
Iron: 77 ug/dL (ref 27–139)
Total Iron Binding Capacity: 349 ug/dL (ref 250–450)
UIBC: 272 ug/dL (ref 118–369)

## 2022-02-17 LAB — CBC WITH DIFFERENTIAL/PLATELET
Basophils Absolute: 0.1 10*3/uL (ref 0.0–0.2)
Basos: 1 %
EOS (ABSOLUTE): 0.5 10*3/uL — ABNORMAL HIGH (ref 0.0–0.4)
Eos: 4 %
Hematocrit: 41.7 % (ref 34.0–46.6)
Hemoglobin: 14 g/dL (ref 11.1–15.9)
Immature Grans (Abs): 0.1 10*3/uL (ref 0.0–0.1)
Immature Granulocytes: 1 %
Lymphocytes Absolute: 3.4 10*3/uL — ABNORMAL HIGH (ref 0.7–3.1)
Lymphs: 27 %
MCH: 28.2 pg (ref 26.6–33.0)
MCHC: 33.6 g/dL (ref 31.5–35.7)
MCV: 84 fL (ref 79–97)
Monocytes Absolute: 1 10*3/uL — ABNORMAL HIGH (ref 0.1–0.9)
Monocytes: 8 %
Neutrophils Absolute: 7.9 10*3/uL — ABNORMAL HIGH (ref 1.4–7.0)
Neutrophils: 59 %
Platelets: 340 10*3/uL (ref 150–450)
RBC: 4.97 x10E6/uL (ref 3.77–5.28)
RDW: 11.8 % (ref 11.7–15.4)
WBC: 12.8 10*3/uL — ABNORMAL HIGH (ref 3.4–10.8)

## 2022-02-17 LAB — VITAMIN B12: Vitamin B-12: 462 pg/mL (ref 232–1245)

## 2022-02-17 LAB — URINALYSIS
Bilirubin, UA: NEGATIVE
Ketones, UA: NEGATIVE
Leukocytes,UA: NEGATIVE
Nitrite, UA: NEGATIVE
Protein,UA: NEGATIVE
RBC, UA: NEGATIVE
Specific Gravity, UA: 1.025 (ref 1.005–1.030)
Urobilinogen, Ur: 0.2 mg/dL (ref 0.2–1.0)
pH, UA: 5.5 (ref 5.0–7.5)

## 2022-02-17 LAB — COMPREHENSIVE METABOLIC PANEL
ALT: 31 IU/L (ref 0–32)
AST: 24 IU/L (ref 0–40)
Albumin/Globulin Ratio: 1.9 (ref 1.2–2.2)
Albumin: 4.8 g/dL — ABNORMAL HIGH (ref 3.7–4.7)
Alkaline Phosphatase: 116 IU/L (ref 44–121)
BUN/Creatinine Ratio: 21 (ref 12–28)
BUN: 18 mg/dL (ref 8–27)
Bilirubin Total: 0.3 mg/dL (ref 0.0–1.2)
CO2: 21 mmol/L (ref 20–29)
Calcium: 10.8 mg/dL — ABNORMAL HIGH (ref 8.7–10.3)
Chloride: 101 mmol/L (ref 96–106)
Creatinine, Ser: 0.85 mg/dL (ref 0.57–1.00)
Globulin, Total: 2.5 g/dL (ref 1.5–4.5)
Glucose: 202 mg/dL — ABNORMAL HIGH (ref 70–99)
Potassium: 4.3 mmol/L (ref 3.5–5.2)
Sodium: 142 mmol/L (ref 134–144)
Total Protein: 7.3 g/dL (ref 6.0–8.5)
eGFR: 71 mL/min/{1.73_m2} (ref >59)

## 2022-02-17 LAB — MAGNESIUM: Magnesium: 1.8 mg/dL (ref 1.6–2.3)

## 2022-02-17 LAB — TSH+FREE T4
Free T4: 1.46 ng/dL (ref 0.82–1.77)
TSH: 1.77 u[IU]/mL (ref 0.450–4.500)

## 2022-02-17 MED ORDER — TRULICITY 1.5 MG/0.5ML ~~LOC~~ SOAJ: 1.5000 mg | SUBCUTANEOUS | 2 refills | Status: DC

## 2022-02-18 ENCOUNTER — Telehealth: Payer: Self-pay | Admitting: Orthopaedic Surgery

## 2022-02-18 ENCOUNTER — Encounter: Payer: Self-pay | Admitting: Cardiology

## 2022-02-18 ENCOUNTER — Telehealth: Payer: Self-pay | Admitting: Medical

## 2022-02-18 DIAGNOSIS — R011 Cardiac murmur, unspecified: Secondary | ICD-10-CM | POA: Insufficient documentation

## 2022-02-18 NOTE — Telephone Encounter (Signed)
Hello Dr. Percival Spanish ? ?I recently saw our mutual patient Ms. Sinkler. ? ?She is not tolerating metoprolol lately, says her pulse rate are staying high despite the beta-blocker, diastolic blood pressure is running low and she is not feeling well in general ? ?I was curious about maybe changing her to diltiazem.  I want to get your thoughts on this ? ?Also could we switch her to a different ACE/diuretic combo that would be 1 pill instead of 2 to decrease the pill burden a little? ? ?We discussed her several recent concerns and numerous medications.  We are ingesting a few other things as well including lowering her Trulicity dose since she is having some GI side effects as well. ? ? ?

## 2022-02-18 NOTE — Telephone Encounter (Signed)
Patient called advised she do not know when she will be back in [place]. Patient said will let Robyn J. Know when she will be back to sign.   Patient phone number [phone] ?

## 2022-02-18 NOTE — Telephone Encounter (Signed)
Called and left a VM advising patient that signature is needed on the IPAD and to just call me back when she gets back in town. ?

## 2022-02-18 NOTE — Progress Notes (Signed)
.  ? ?Virtual Visit via Video Note  ? ?This visit type was conducted due to national recommendations for restrictions regarding the COVID-19 Pandemic (e.g. social distancing) in an effort to limit this patient's exposure and mitigate transmission in our community.  Due to her co-morbid illnesses, this patient is at least at moderate risk for complications without adequate follow up.  This format is felt to be most appropriate for this patient at this time.  All issues noted in this document were discussed and addressed.  A limited physical exam was performed with this format.  Please refer to the patient's chart for her consent to telehealth for [facility]. ? ? ?Date:  02/19/2022  ? ?ID:  Robyn Hill, DOB 1946-10-02, MRN [id number] ?The patient was identified using 2 identifiers. ? ?Patient Location: Home ?Provider Location: Office/Clinic ? ? ?PCP:  Carlena Hurl, PA-C ?  ?[facility] Providers ?Cardiologist:  Minus Breeding, MD    ? ?Evaluation Performed:  Follow-Up Visit ? ?Chief Complaint:  Palpitations ? ?History of Present Illness:   ? ?Robyn Hill is a 76 y.o. female who presents for evaluation of possible atrial fib. When I saw her in the office for evaluation of this possibly seen on an EKG at her primary care office I thought that this was PAT.  She wore a monitor.   I did not see atrial fib on this monitor.  However, I did review her Apple Watch which demonstrated atrial fibrillation.  [name] has a CHA2DS2 - VASc score of 4.     She saw Carlena Hurl, PA-C yesterday and was still having increased HR and was fatigued.  He suggested possibly switching beta blocker to calcium channel blocker.   ? ?She says that she has been getting some dizziness and wobbliness.  She is actually going to have an MRI.  She did have a urinary infection and took some antibiotics and wondered if this could have been related but she is also describing just generalized weakness.  She has fluctuating  heart rates.  She says it goes up to as high as 141.  Her daughter was there to confirm.  Sometimes however it is low in the 50s.  When she wore her monitor we did not confirm any sustained dysrhythmias but she is having more frequent rapid episodes.  She has not had any frank syncope or or presyncope.  There is no new chest discomfort, neck or arm discomfort.  She said no new shortness of breath, PND or orthopnea. ? ? ?Past Medical History:  ?Diagnosis Date  ? Adrenal mass, right ([place])   ? Allergy   ? Anemia   ? Asthma   ? "as a teenager"  ? Blind right eye   ? since childhood  ? Blood transfusion 1970  ? Chronic back pain   ? Chronic diarrhea   ? Colestipol therapy  ? Depression   ? Diverticulitis   ? Diverticulosis   ? Fatty liver   ? Female bladder prolapse   ? GERD (gastroesophageal reflux disease)   ? Glaucoma   ? H/O bone density study 11/29/2014  ? normal study although mild decreased in density from prior study  ? H/O hiatal hernia   ? Hemorrhoids   ? History of kidney stones   ? History of uterine cancer   ? s/p hysterectomy  ? Hyperlipidemia   ? Hypertension   ? Migraine   ? hx of migraines in past   ?  Neuromuscular disorder ([place])   ? diabetic neuropahthy  ? Numbness and tingling of both legs   ? Osteoarthritis   ? Rheumatic fever   ? age 57  ? Type II diabetes mellitus ([place])   ? Urinary incontinence   ? Vitamin D deficiency   ? ?Past Surgical History:  ?Procedure Laterality Date  ? ABDOMINAL HYSTERECTOMY  1980  ? no further pap smears needed  ? ANTERIOR AND POSTERIOR REPAIR N/A 01/29/2015  ? Procedure: CYSTOCELE AND RECTOCELE ;  Surgeon: Bjorn Loser, MD;  Location: [facility];  Service: Urology;  Laterality: N/A;  ? BACK SURGERY    ? BREAST BIOPSY  08/1997  ? right  ? CARDIAC CATHETERIZATION  11/2006  ? CHOLECYSTECTOMY    ? COLONOSCOPY  2014  ? polyps, external hemorrhoids  ? CYSTOSCOPY N/A 01/29/2015  ? Procedure: CYSTOSCOPY;  Surgeon: Bjorn Loser, MD;  Location: [facility];  Service: Urology;  Laterality:  N/A;  ? [place] OF UTERUS  1980  ? EDSI for back pain  last 01/2009  ? multiple x 5  ? EYE SURGERY    ? Bil Blind R eye  ? [place]  ? LIVER BIOPSY  04/1999  ? minor laceration repair    ? right eye  ? POSTERIOR FUSION LUMBAR SPINE  04/05/12  ? PULSE GENERATOR IMPLANT Right 03/12/2016  ? Procedure: Right Reposition of implantable pulse generator;  Surgeon: Erline Levine, MD;  Location: [place] [facility];  Service: Neurosurgery;  Laterality: Right;  Right Reposition of implantable pulse generator  ? SPINAL CORD STIMULATOR INSERTION N/A 09/06/2015  ? Procedure: LUMBAR SPINAL CORD STIMULATOR INSERTION;  Surgeon: Clydell Hakim, MD;  Location: [place] [facility];  Service: Neurosurgery;  Laterality: N/A;  Spinal Cord Stimulator placement  ? SPINAL CORD STIMULATOR INSERTION N/A 10/22/2015  ? Procedure: Laminectomy for spinal cord stimulator paddle lead and implantable generator placement;  Surgeon: Erline Levine, MD;  Location: [place] [facility];  Service: Neurosurgery;  Laterality: N/A;  Laminectomy for spinal cord stimulator paddle lead and implantable generator placement  ? steroid injections of hip  last 02/2009  ? multiple x 4  ? [place]  ? TRABECULECTOMY  1996  ? bilaterally; "for glaucoma"  ? UPPER GI ENDOSCOPY  05/21/14  ? food, retained in the body of the stomach  ? VAGINAL PROLAPSE REPAIR N/A 01/29/2015  ? Procedure:  VAULT PROLAPSE WITH GRAFT ;  Surgeon: Bjorn Loser, MD;  Location: [facility];  Service: Urology;  Laterality: N/A;  ?  ? ?Prior to Admission medications   ?Medication Sig Start Date End Date Taking? Authorizing Provider  ?Accu-Chek FastClix Lancets MISC USE 1 STRIP TO CHECK GLUCOSE TWICE DAILY 01/01/22  Yes Tysinger, Camelia Eng, PA-C  ?ACCU-CHEK GUIDE test strip USE TO CHECK BLOOD GLUCOSE TWICE DAILY 01/01/22  Yes Tysinger, Camelia Eng, PA-C  ?apixaban (ELIQUIS) 5 MG TABS tablet Take 1 tablet (5 mg total) by mouth 2 (two) times daily. 01/13/22  Yes Minus Breeding, MD   ?atorvastatin (LIPITOR) 40 MG tablet Take 1 tablet (40 mg total) by mouth at bedtime. 02/05/22  Yes Tysinger, Camelia Eng, PA-C  ?B-D UF III MINI PEN NEEDLES 31G X 5 MM MISC USE AS DIRECTED TO INJECT TOUJEO 12/31/21  Yes Tysinger, Camelia Eng, PA-C  ?Blood Glucose Calibration (ACCU-CHEK INSTANT CONTROL) LIQD Use for controls for accu-chek activa meter 11/24/16  Yes Tysinger, Camelia Eng, PA-C  ?Blood Glucose Monitoring Suppl (ACCU-CHEK GUIDE ME) w/Device KIT 1 Device  by Does not apply route 2 (two) times daily at 8 am and 10 pm. 03/23/19  Yes Tysinger, Camelia Eng, PA-C  ?brimonidine (ALPHAGAN) 0.2 % ophthalmic solution Place 1 drop into the left eye 2 times daily. 01/25/17  Yes [provider]  ?cetirizine (ZYRTEC) 10 MG tablet Take 10 mg by mouth daily. 01/27/21  Yes [provider]  ?Cholecalciferol (VITAMIN D-3) 25 MCG (1000 UT) CAPS Take 1 capsule (1,000 Units total) by mouth daily. 05/23/21  Yes Tysinger, Camelia Eng, PA-C  ?dexlansoprazole (DEXILANT) 60 MG capsule Take 1 capsule (60 mg total) by mouth 2 (two) times daily. 05/23/21  Yes Tysinger, Camelia Eng, PA-C  ?dorzolamide-timolol (COSOPT) 22.3-6.8 MG/ML ophthalmic solution Place 1 drop into both eyes 2 (two) times daily.   Yes [provider]  ?Dulaglutide (TRULICITY) 1.5 TM/1.9QQ SOPN Inject 1.5 mg into the skin once a week. 02/17/22  Yes Tysinger, Camelia Eng, PA-C  ?DULoxetine (CYMBALTA) 60 MG capsule Take 1 capsule (60 mg total) by mouth daily. 02/10/22  Yes Tysinger, Camelia Eng, PA-C  ?enalapril (VASOTEC) 20 MG tablet Take 1 tablet by mouth once daily 07/22/21  Yes Tysinger, Camelia Eng, PA-C  ?estradiol (ESTRACE) 0.1 MG/GM vaginal cream SMARTSIG:sparingly Vaginal Twice a Week 02/12/22  Yes [provider]  ?ferrous gluconate (FERGON) 324 MG tablet Take 1 tablet by mouth once daily with breakfast 12/12/21  Yes Tysinger, Camelia Eng, PA-C  ?hydrochlorothiazide (HYDRODIURIL) 25 MG tablet Take 1 tablet (25 mg total) by mouth daily. 05/23/21  Yes Tysinger, Camelia Eng, PA-C   ?insulin glargine, 1 Unit Dial, (TOUJEO SOLOSTAR) 300 UNIT/ML Solostar Pen Inject 33 Units into the skin daily. 06/16/21  Yes Tysinger, Camelia Eng, PA-C  ?latanoprost (XALATAN) 0.005 % ophthalmic solution Place 1 dr

## 2022-02-18 NOTE — Telephone Encounter (Signed)
Noted.   ?Lauren F.is aware.   ?Thank you ? ?

## 2022-02-18 NOTE — Telephone Encounter (Signed)
Patient called back asked if the form can be e-mailed to her. Please see previous notes.   [email or web address]   The number to contact patient is [phone] ?

## 2022-02-19 ENCOUNTER — Encounter: Payer: Self-pay | Admitting: Cardiology

## 2022-02-19 ENCOUNTER — Other Ambulatory Visit: Payer: Self-pay

## 2022-02-19 ENCOUNTER — Telehealth (INDEPENDENT_AMBULATORY_CARE_PROVIDER_SITE_OTHER): Payer: 59 | Admitting: Cardiology

## 2022-02-19 VITALS — BP 122/59 | HR 69 | Ht 65.0 in | Wt 188.4 lb

## 2022-02-19 DIAGNOSIS — R011 Cardiac murmur, unspecified: Secondary | ICD-10-CM

## 2022-02-19 DIAGNOSIS — I6523 Occlusion and stenosis of bilateral carotid arteries: Secondary | ICD-10-CM

## 2022-02-19 DIAGNOSIS — I480 Paroxysmal atrial fibrillation: Secondary | ICD-10-CM

## 2022-02-19 DIAGNOSIS — M1990 Unspecified osteoarthritis, unspecified site: Secondary | ICD-10-CM

## 2022-02-19 MED ORDER — DILTIAZEM HCL 30 MG PO TABS: ORAL_TABLET | ORAL | 3 refills | Status: DC

## 2022-02-19 NOTE — Patient Instructions (Addendum)
Medication Instructions:  ?Stop Metoprolol ?Start Cardizem 30 mg twice a day ?Continue all other medications ?*If you need a refill on your cardiac medications before your next appointment, please call your pharmacy* ? ? ?Lab Work: ?None ordered ? ? ?Testing/Procedures: ?None ordered ? ? ?Follow-Up: ?At [facility], you and your health needs are our priority.  As part of our continuing mission to provide you with exceptional heart care, we have created designated Provider Care Teams.  These Care Teams include your primary Cardiologist (physician) and Advanced Practice Providers (APPs -  Physician Assistants and Nurse Practitioners) who all work together to provide you with the care you need, when you need it. ? ?We recommend signing up for the patient portal called "MyChart".  Sign up information is provided on this After Visit Summary.  MyChart is used to connect with patients for Virtual Visits (Telemedicine).  Patients are able to view lab/test results, encounter notes, upcoming appointments, etc.  Non-urgent messages can be sent to your provider as well.   ?To learn more about what you can do with MyChart, go to [email or web address].   ? ?Your next appointment:  Wednesday 04/22/22 at 11:00 am ?  ? ?The format for your next appointment: Office ? ? ?Provider: Dr.Hochrein  ? ? ?Important Information About Sugar ? ? ? ? ? ? ?

## 2022-02-23 ENCOUNTER — Encounter: Payer: Self-pay | Admitting: Internal Medicine

## 2022-02-23 ENCOUNTER — Encounter: Payer: Self-pay | Admitting: Medical

## 2022-02-25 ENCOUNTER — Other Ambulatory Visit: Payer: Self-pay

## 2022-02-25 ENCOUNTER — Telehealth: Payer: Self-pay | Admitting: Medical

## 2022-02-25 DIAGNOSIS — E118 Type 2 diabetes mellitus with unspecified complications: Secondary | ICD-10-CM

## 2022-02-25 MED ORDER — TOUJEO SOLOSTAR 300 UNIT/ML ~~LOC~~ SOPN: 33.0000 [IU] | PEN_INJECTOR | Freq: Every day | SUBCUTANEOUS | 3 refills | Status: DC

## 2022-02-25 NOTE — Telephone Encounter (Signed)
Referral Followup °

## 2022-03-03 ENCOUNTER — Ambulatory Visit: Payer: 59 | Admitting: Medical

## 2022-03-10 ENCOUNTER — Other Ambulatory Visit: Payer: Self-pay | Admitting: Medical

## 2022-03-10 MED ORDER — DEXLANSOPRAZOLE 60 MG PO CPDR: 1.0000 | DELAYED_RELEASE_CAPSULE | Freq: Two times a day (BID) | ORAL | 0 refills | Status: DC

## 2022-03-12 ENCOUNTER — Ambulatory Visit (HOSPITAL_COMMUNITY)
Admission: RE | Admit: 2022-03-12 | Discharge: 2022-03-12 | Disposition: A | Discharge status: Home / Self Care | Payer: 59 | Source: Ambulatory Visit | Attending: Medical | Admitting: Medical

## 2022-03-12 ENCOUNTER — Other Ambulatory Visit: Payer: Self-pay | Admitting: Internal Medicine

## 2022-03-12 DIAGNOSIS — R531 Weakness: Secondary | ICD-10-CM | POA: Insufficient documentation

## 2022-03-12 DIAGNOSIS — G479 Sleep disorder, unspecified: Secondary | ICD-10-CM | POA: Diagnosis present

## 2022-03-12 DIAGNOSIS — R4781 Slurred speech: Secondary | ICD-10-CM | POA: Diagnosis not present

## 2022-03-12 DIAGNOSIS — I10 Essential (primary) hypertension: Secondary | ICD-10-CM | POA: Diagnosis present

## 2022-03-12 DIAGNOSIS — R519 Headache, unspecified: Secondary | ICD-10-CM | POA: Diagnosis not present

## 2022-03-12 DIAGNOSIS — I6782 Cerebral ischemia: Secondary | ICD-10-CM | POA: Insufficient documentation

## 2022-03-12 DIAGNOSIS — R270 Ataxia, unspecified: Secondary | ICD-10-CM

## 2022-03-12 MED ORDER — VITAMIN D-3 25 MCG (1000 UT) PO CAPS: 1.0000 | ORAL_CAPSULE | Freq: Every day | ORAL | 3 refills | Status: DC

## 2022-03-12 MED ORDER — FERROUS GLUCONATE 324 (38 FE) MG PO TABS: 324.0000 mg | ORAL_TABLET | Freq: Every day | ORAL | 0 refills | Status: DC

## 2022-03-12 MED ORDER — ENALAPRIL MALEATE 20 MG PO TABS: 20.0000 mg | ORAL_TABLET | Freq: Every day | ORAL | 0 refills | Status: DC

## 2022-03-27 ENCOUNTER — Other Ambulatory Visit: Payer: Self-pay | Admitting: Medical

## 2022-03-27 ENCOUNTER — Ambulatory Visit: Payer: 59 | Admitting: Internal Medicine

## 2022-03-27 DIAGNOSIS — I480 Paroxysmal atrial fibrillation: Secondary | ICD-10-CM

## 2022-04-04 ENCOUNTER — Other Ambulatory Visit: Payer: Self-pay | Admitting: Medical

## 2022-04-06 NOTE — Telephone Encounter (Signed)
Pt. Requesting refill on Celebrex last apt was 02/16/22

## 2022-04-22 ENCOUNTER — Ambulatory Visit: Payer: 59 | Admitting: Cardiology

## 2022-04-27 ENCOUNTER — Other Ambulatory Visit: Payer: Self-pay | Admitting: Internal Medicine

## 2022-04-27 DIAGNOSIS — E118 Type 2 diabetes mellitus with unspecified complications: Secondary | ICD-10-CM

## 2022-04-27 MED ORDER — ACCU-CHEK FASTCLIX LANCETS MISC: 2 refills | Status: DC

## 2022-04-27 MED ORDER — ACCU-CHEK FASTCLIX LANCETS MISC: 0 refills | Status: DC

## 2022-04-29 ENCOUNTER — Other Ambulatory Visit: Payer: Self-pay

## 2022-04-29 ENCOUNTER — Telehealth: Payer: Self-pay

## 2022-04-29 MED ORDER — BD PEN NEEDLE MINI U/F 31G X 5 MM MISC: 0 refills | Status: DC

## 2022-04-29 NOTE — Telephone Encounter (Signed)
Sent in med

## 2022-04-29 NOTE — Telephone Encounter (Signed)
Pt. Daughter called wanting to know if you could send in her pen needles to [facility] in [place]. She requested a refill on them online but didn't see it had been filled yet.

## 2022-05-01 ENCOUNTER — Other Ambulatory Visit: Payer: Self-pay | Admitting: Internal Medicine

## 2022-05-01 DIAGNOSIS — E118 Type 2 diabetes mellitus with unspecified complications: Secondary | ICD-10-CM

## 2022-05-01 MED ORDER — BD PEN NEEDLE MINI U/F 31G X 5 MM MISC: 1 refills | Status: DC

## 2022-05-01 MED ORDER — ACCU-CHEK FASTCLIX LANCETS MISC: 2 refills | Status: DC

## 2022-05-14 ENCOUNTER — Other Ambulatory Visit: Payer: Self-pay | Admitting: Medical

## 2022-05-15 ENCOUNTER — Telehealth: Payer: Self-pay | Admitting: Medical

## 2022-05-15 ENCOUNTER — Other Ambulatory Visit: Payer: Self-pay

## 2022-05-15 MED ORDER — HYDROCHLOROTHIAZIDE 25 MG PO TABS: 25.0000 mg | ORAL_TABLET | Freq: Every day | ORAL | 1 refills | Status: DC

## 2022-05-15 NOTE — Telephone Encounter (Signed)
Pt left message for refill on HCTZ. Please send to DIFFERENT PHARMACY. Send to her NEW PHARMACY [facility] in [place].

## 2022-06-07 ENCOUNTER — Other Ambulatory Visit: Payer: Self-pay | Admitting: Medical

## 2022-06-11 ENCOUNTER — Ambulatory Visit: Payer: 59 | Admitting: Cardiology

## 2022-06-23 ENCOUNTER — Other Ambulatory Visit: Payer: Self-pay | Admitting: Medical

## 2022-06-23 ENCOUNTER — Telehealth: Payer: Self-pay | Admitting: Medical

## 2022-06-23 MED ORDER — CELECOXIB 200 MG PO CAPS: 200.0000 mg | ORAL_CAPSULE | Freq: Every day | ORAL | 1 refills | Status: DC

## 2022-06-23 NOTE — Telephone Encounter (Signed)
Pt needs her pharmacy changed for her celebrex to [facility] [phone] - [place], [place] - [address] AT [address]

## 2022-06-24 ENCOUNTER — Encounter: Payer: Self-pay | Admitting: Internal Medicine

## 2022-07-16 ENCOUNTER — Other Ambulatory Visit: Payer: Self-pay | Admitting: Medical

## 2022-07-16 MED ORDER — LINACLOTIDE 72 MCG PO CAPS: 72.0000 ug | ORAL_CAPSULE | Freq: Every day | ORAL | 0 refills | Status: DC

## 2022-07-21 ENCOUNTER — Other Ambulatory Visit: Payer: Self-pay | Admitting: Internal Medicine

## 2022-07-21 DIAGNOSIS — E118 Type 2 diabetes mellitus with unspecified complications: Secondary | ICD-10-CM

## 2022-07-21 MED ORDER — DULOXETINE HCL 60 MG PO CPEP: 60.0000 mg | ORAL_CAPSULE | Freq: Every day | ORAL | 0 refills | Status: DC

## 2022-07-21 MED ORDER — FERROUS GLUCONATE 324 (38 FE) MG PO TABS: ORAL_TABLET | ORAL | 0 refills | Status: DC

## 2022-07-21 MED ORDER — CETIRIZINE HCL 10 MG PO TABS: 10.0000 mg | ORAL_TABLET | Freq: Every day | ORAL | 0 refills | Status: DC

## 2022-07-21 MED ORDER — ACCU-CHEK GUIDE VI STRP: ORAL_STRIP | 0 refills | Status: DC

## 2022-07-28 ENCOUNTER — Encounter: Payer: Self-pay | Admitting: Internal Medicine

## 2022-08-04 ENCOUNTER — Ambulatory Visit: Payer: 59 | Admitting: Medical

## 2022-08-06 ENCOUNTER — Other Ambulatory Visit: Payer: Self-pay | Admitting: Medical

## 2022-08-06 MED ORDER — METFORMIN HCL 500 MG PO TABS: 500.0000 mg | ORAL_TABLET | Freq: Two times a day (BID) | ORAL | 0 refills | Status: DC

## 2022-08-10 ENCOUNTER — Ambulatory Visit (INDEPENDENT_AMBULATORY_CARE_PROVIDER_SITE_OTHER): Payer: 59 | Admitting: Medical

## 2022-08-10 VITALS — BP 122/68 | HR 76 | Wt 194.6 lb

## 2022-08-10 DIAGNOSIS — I4891 Unspecified atrial fibrillation: Secondary | ICD-10-CM

## 2022-08-10 DIAGNOSIS — R413 Other amnesia: Secondary | ICD-10-CM | POA: Diagnosis not present

## 2022-08-10 DIAGNOSIS — D638 Anemia in other chronic diseases classified elsewhere: Secondary | ICD-10-CM

## 2022-08-10 DIAGNOSIS — R339 Retention of urine, unspecified: Secondary | ICD-10-CM | POA: Diagnosis not present

## 2022-08-10 DIAGNOSIS — E785 Hyperlipidemia, unspecified: Secondary | ICD-10-CM

## 2022-08-10 DIAGNOSIS — K5909 Other constipation: Secondary | ICD-10-CM | POA: Diagnosis not present

## 2022-08-10 DIAGNOSIS — G8929 Other chronic pain: Secondary | ICD-10-CM

## 2022-08-10 DIAGNOSIS — E118 Type 2 diabetes mellitus with unspecified complications: Secondary | ICD-10-CM | POA: Diagnosis not present

## 2022-08-10 DIAGNOSIS — Z23 Encounter for immunization: Secondary | ICD-10-CM

## 2022-08-10 DIAGNOSIS — I10 Essential (primary) hypertension: Secondary | ICD-10-CM

## 2022-08-10 DIAGNOSIS — I6523 Occlusion and stenosis of bilateral carotid arteries: Secondary | ICD-10-CM

## 2022-08-10 DIAGNOSIS — E1169 Type 2 diabetes mellitus with other specified complication: Secondary | ICD-10-CM

## 2022-08-10 DIAGNOSIS — I700 Atherosclerosis of aorta: Secondary | ICD-10-CM

## 2022-08-10 DIAGNOSIS — G894 Chronic pain syndrome: Secondary | ICD-10-CM | POA: Insufficient documentation

## 2022-08-10 LAB — POCT URINALYSIS DIP (PROADVANTAGE DEVICE)
Bilirubin, UA: NEGATIVE
Blood, UA: NEGATIVE
Glucose, UA: >=1000 mg/dL — AB
Ketones, POC UA: NEGATIVE mg/dL
Nitrite, UA: NEGATIVE
Protein Ur, POC: NEGATIVE mg/dL
Specific Gravity, Urine: 1.015
Urobilinogen, Ur: NEGATIVE
pH, UA: 6 (ref 5.0–8.0)

## 2022-08-10 MED ORDER — HYDROCORTISONE (PERIANAL) 2.5 % EX CREA: TOPICAL_CREAM | CUTANEOUS | 1 refills | Status: DC

## 2022-08-10 MED ORDER — ATORVASTATIN CALCIUM 40 MG PO TABS: 40.0000 mg | ORAL_TABLET | Freq: Every day | ORAL | 0 refills | Status: DC

## 2022-08-10 MED ORDER — LINACLOTIDE 72 MCG PO CAPS: 72.0000 ug | ORAL_CAPSULE | Freq: Every day | ORAL | 1 refills | Status: DC

## 2022-08-10 NOTE — Progress Notes (Signed)
Subjective: Chief Complaint  Patient presents with   med check    Med check- needs fasting and flu shot Thinks bladder infection- doesn't urinate much- drinks a lot but just not void- drinking 3-4 bottles of water, cranberry juice   Here for med check and concerns.  Accompanied by her daughter Lynelle Smoke who has been living with her since 06/2022 when her husband passed.  She would like a flu shot today  Her daughter feels like that in recent months her memory has gotten worse.  She definitely has problems forgetting names or certain words but sometimes she will ask a question multiple times.  She takes Benadryl for itching as she continues to itch on and off  She is on pain medicine per neurosurgery including Keppra and Pamelor and recently morphine was added.  She is taking morphine 4 times a day currently or every 6 hours.  She has not been taking Keppra regularly at all and only uses the Pamelor occasionally to help with sleep  She does not snore but no knowledge of witnessed apnea or apnea events  She has been having trouble with urination not having very frequent urination.  Seems to be more backed up not urinating like normal.  She also has chronic constipation ongoing.  No burning when she urinates, no blood in urine, no odor in urine.  She is compliant with her regular medicines for cholesterol blood pressure and diabetes.  No recent high or low sugars out-of-control   Past Medical History:  Diagnosis Date   Adrenal mass, right ([place])    Allergy    Anemia    Asthma    "as a teenager"   Blind right eye    since childhood   Blood transfusion 1970   Chronic back pain    Chronic diarrhea    Colestipol therapy   Depression    Diverticulitis    Diverticulosis    Fatty liver    Female bladder prolapse    GERD (gastroesophageal reflux disease)    Glaucoma    H/O bone density study 11/29/2014   normal study although mild decreased in density from prior study   H/O hiatal  hernia    Hemorrhoids    History of kidney stones    History of uterine cancer    s/p hysterectomy   Hyperlipidemia    Hypertension    Migraine    hx of migraines in past    Neuromuscular disorder (HCC)    diabetic neuropahthy   Numbness and tingling of both legs    Osteoarthritis    Rheumatic fever    age 76   Type II diabetes mellitus ([place])    Urinary incontinence    Vitamin D deficiency    Current Outpatient Medications on File Prior to Visit  Medication Sig Dispense Refill   Accu-Chek FastClix Lancets MISC Test 1-2 times daily 102 each 2   apixaban (ELIQUIS) 5 MG TABS tablet Take 1 tablet (5 mg total) by mouth 2 (two) times daily. 60 tablet 11   Blood Glucose Calibration (ACCU-CHEK INSTANT CONTROL) LIQD Use for controls for accu-chek activa meter 1 each 0   Blood Glucose Monitoring Suppl (ACCU-CHEK GUIDE ME) w/Device KIT 1 Device by Does not apply route 2 (two) times daily at 8 am and 10 pm. 1 kit 0   brimonidine (ALPHAGAN) 0.2 % ophthalmic solution Place 1 drop into the left eye 2 times daily.     celecoxib (CELEBREX) 200 MG capsule Take 1 capsule (  200 mg total) by mouth daily. 30 capsule 1   cetirizine (ZYRTEC) 10 MG tablet Take 1 tablet (10 mg total) by mouth daily. Take 1 tablet (10 mg total) by mouth daily. 90 tablet 0   Cholecalciferol (VITAMIN D-3) 25 MCG (1000 UT) CAPS Take 1 capsule (1,000 Units total) by mouth daily. 90 capsule 3   dexlansoprazole (DEXILANT) 60 MG capsule TAKE ONE CAPSULE BY MOUTH TWICE DAILY 180 capsule 0   diltiazem (CARDIZEM) 30 MG tablet Take 30 mg twice a day 180 tablet 3   dorzolamide-timolol (COSOPT) 22.3-6.8 MG/ML ophthalmic solution Place 1 drop into both eyes 2 (two) times daily.     DULoxetine (CYMBALTA) 60 MG capsule Take 1 capsule (60 mg total) by mouth daily. 90 capsule 0   enalapril (VASOTEC) 20 MG tablet TAKE 1 TABLET(20 MG) BY MOUTH DAILY 90 tablet 0   ferrous gluconate (FERGON) 324 MG tablet TAKE 1 TABLET(324 MG) BY MOUTH DAILY WITH  BREAKFAST 90 tablet 0   glucose blood (ACCU-CHEK GUIDE) test strip Use as instructed 150 each 0   hydrochlorothiazide (HYDRODIURIL) 25 MG tablet Take 1 tablet (25 mg total) by mouth daily. 90 tablet 1   insulin glargine, 1 Unit Dial, (TOUJEO SOLOSTAR) 300 UNIT/ML Solostar Pen Inject 33 Units into the skin daily. 15 mL 3   Insulin Pen Needle (B-D UF III MINI PEN NEEDLES) 31G X 5 MM MISC USE AS DIRECTED TO INJECT TOUJEO Strength: 31G X 5 MM 100 each 1   latanoprost (XALATAN) 0.005 % ophthalmic solution Place 1 drop into the right eye at bedtime.     levETIRAcetam (KEPPRA) 250 MG tablet Take 250 mg by mouth 2 (two) times daily.     metFORMIN (GLUCOPHAGE) 500 MG tablet Take 1 tablet (500 mg total) by mouth 2 (two) times daily with a meal. 180 tablet 0   nortriptyline (PAMELOR) 10 MG capsule Take 10-20 mg by mouth at bedtime.     triamcinolone (KENALOG) 0.1 % Apply topically.     TRULICITY 1.5 NF/6.2ZH SOPN ADMINISTER 1.5 MG UNDER THE SKIN 1 TIME A WEEK 2 mL 1   No current facility-administered medications on file prior to visit.   ROS as in subjective   Objective: BP 122/68   Pulse 76   Wt 194 lb 9.6 oz (88.3 kg)   BMI 32.38 kg/m   Gen: wd, wn, nad [place], answers questions appropriately Alert and oriented x3, PERRLA, EOMI, nonfocal exam, uses cane, cautious on feet Heart regular rate and rhythm, no obvious arrhythmia or murmur today Lungs clear No significant lower extremity edema today  Diabetic Foot Exam - Simple   Simple Foot Form Diabetic Foot exam was performed with the following findings: Yes 08/10/2022  4:28 PM  Visual Inspection No deformities, no ulcerations, no other skin breakdown bilaterally: Yes Sensation Testing See comments: Yes Pulse Check See comments: Yes Comments 1+ pedal pulses, chronically decreased monofilament sensation throughout both feet        Assessment: Encounter Diagnoses  Name Primary?   Memory change Yes   Urinary retention     Chronic constipation    Diabetes mellitus with complication (HCC)    Dyslipidemia associated with type 2 diabetes mellitus (HCC)    Essential hypertension, benign    Aortic atherosclerosis (HCC)    Anemia of chronic disease    Atrial fibrillation, unspecified type ([place])    Bilateral carotid artery stenosis    Needs flu shot    Other chronic pain  Plan: We discussed the memory concerns.  Some additional labs today, reviewed other labs done over this past year that ruled out certain causes of memory changes.  We reviewed MR and MRA of brain that she had May 2023 showing moderate chronic microvascular ischemic changes.  We reviewed her 09/2021 carotid ultrasound showing some stenosis bilaterally  Pending labs, consider neurology consult and possible medication modification.  We discussed that several medicines can have an impact on memory.  Neurology can consider modification of regimen  Urinary retention likely due to recent morphine.  She is using morphine per neurosurgery pain management approximately 4 times daily which is likely making her constipation worse and causing some urinary retention  Diabetes-continue current medication, updated labs today  Hyperlipidemia associate with diabetes-continue current medication  Hypertension-continue current medication  Given the memory concern, consider updated sleep evaluation  A-fib-follow-up with cardiology as planned  Chronic pain-discussed possibly talking to her chronic pain specialist and possibly neurologist about her overall medication polypharmacy.  She is not taking Pamelor or Keppra very often.  In light of the new morphine and in light of some of her symptoms including urinary retention and memory concerns it may be worth backing off some of these medications. I will defer this discussion to neurosurgery and neurology.  She is seeing pain management to neurosurgery but we will get neurology involved with the memory  concern    Vaccine counseling: You are due for Tdap and shingrix vaccines.  Get these at your local pharmacy and have the pharmacy send [place] documentation  Counseled on the influenza virus vaccine.  Vaccine information sheet given.  Influenza vaccine given after consent obtained.   Keosha was seen today for med check.  Diagnoses and all orders for this visit:  Memory change -     Ambulatory referral to Neurology  Urinary retention  Chronic constipation  Diabetes mellitus with complication (HCC) -     Hemoglobin A1c -     Comprehensive metabolic panel -     CBC -     Microalbumin, urine -     POCT Urinalysis DIP (Proadvantage Device)  Dyslipidemia associated with type 2 diabetes mellitus (HCC)  Essential hypertension, benign  Aortic atherosclerosis (HCC)  Anemia of chronic disease -     CBC  Atrial fibrillation, unspecified type (HCC)  Bilateral carotid artery stenosis  Needs flu shot -     Flu Vaccine QUAD High Dose(Fluad)  Other chronic pain -     Ambulatory referral to Neurology  Other orders -     atorvastatin (LIPITOR) 40 MG tablet; Take 1 tablet (40 mg total) by mouth at bedtime. -     hydrocortisone (PROCTO-MED HC) 2.5 % rectal cream; APPLY CREAM RECTALLY TWICE DAILY -     linaclotide (LINZESS) 72 MCG capsule; Take 1 capsule (72 mcg total) by mouth daily before breakfast.   F/u pending labs

## 2022-08-11 LAB — HEMOGLOBIN A1C
Est. average glucose Bld gHb Est-mCnc: 166 mg/dL
Hgb A1c MFr Bld: 7.4 % — ABNORMAL HIGH (ref 4.8–5.6)

## 2022-08-11 LAB — COMPREHENSIVE METABOLIC PANEL
ALT: 48 IU/L — ABNORMAL HIGH (ref 0–32)
AST: 57 IU/L — ABNORMAL HIGH (ref 0–40)
Albumin/Globulin Ratio: 1.9 (ref 1.2–2.2)
Albumin: 4.8 g/dL (ref 3.8–4.8)
Alkaline Phosphatase: 108 IU/L (ref 44–121)
BUN/Creatinine Ratio: 28 (ref 12–28)
BUN: 23 mg/dL (ref 8–27)
Bilirubin Total: 0.4 mg/dL (ref 0.0–1.2)
CO2: 25 mmol/L (ref 20–29)
Calcium: 10.3 mg/dL (ref 8.7–10.3)
Chloride: 100 mmol/L (ref 96–106)
Creatinine, Ser: 0.83 mg/dL (ref 0.57–1.00)
Globulin, Total: 2.5 g/dL (ref 1.5–4.5)
Glucose: 86 mg/dL (ref 70–99)
Potassium: 5 mmol/L (ref 3.5–5.2)
Sodium: 141 mmol/L (ref 134–144)
Total Protein: 7.3 g/dL (ref 6.0–8.5)
eGFR: 73 mL/min/{1.73_m2} (ref >59)

## 2022-08-11 LAB — CBC
Hematocrit: 37.9 % (ref 34.0–46.6)
Hemoglobin: 12.7 g/dL (ref 11.1–15.9)
MCH: 27.7 pg (ref 26.6–33.0)
MCHC: 33.5 g/dL (ref 31.5–35.7)
MCV: 83 fL (ref 79–97)
Platelets: 357 10*3/uL (ref 150–450)
RBC: 4.58 x10E6/uL (ref 3.77–5.28)
RDW: 11.4 % — ABNORMAL LOW (ref 11.7–15.4)
WBC: 11.7 10*3/uL — ABNORMAL HIGH (ref 3.4–10.8)

## 2022-08-12 LAB — MICROALBUMIN, URINE: Microalbumin, Urine: 6.2 ug/mL

## 2022-08-14 LAB — MICROALBUMIN / CREATININE URINE RATIO

## 2022-08-16 ENCOUNTER — Other Ambulatory Visit: Payer: Self-pay | Admitting: Medical

## 2022-08-20 MED ORDER — BD PEN NEEDLE MINI U/F 31G X 5 MM MISC: 1 refills | Status: DC

## 2022-08-25 ENCOUNTER — Other Ambulatory Visit: Payer: Self-pay | Admitting: Medical

## 2022-08-28 ENCOUNTER — Other Ambulatory Visit: Payer: Self-pay | Admitting: Medical

## 2022-08-28 MED ORDER — NORTRIPTYLINE HCL 10 MG PO CAPS: 10.0000 mg | ORAL_CAPSULE | Freq: Every day | ORAL | 3 refills | Status: DC

## 2022-09-07 MED ORDER — TRULICITY 1.5 MG/0.5ML ~~LOC~~ SOAJ: SUBCUTANEOUS | 0 refills | Status: DC

## 2022-09-10 ENCOUNTER — Other Ambulatory Visit: Payer: Self-pay | Admitting: Medical

## 2022-09-10 DIAGNOSIS — E118 Type 2 diabetes mellitus with unspecified complications: Secondary | ICD-10-CM

## 2022-09-14 ENCOUNTER — Other Ambulatory Visit: Payer: Self-pay | Admitting: Medical

## 2022-09-14 NOTE — Telephone Encounter (Signed)
Please call pharmacy and cancel Celebrex.  Have her discontinue this as well and remove from med list since she is on Eliquis.

## 2022-09-22 LAB — HM DIABETES EYE EXAM

## 2022-10-05 ENCOUNTER — Ambulatory Visit (HOSPITAL_COMMUNITY)
Admission: RE | Admit: 2022-10-05 | Discharge: 2022-10-05 | Disposition: A | Discharge status: Home / Self Care | Payer: 59 | Source: Ambulatory Visit | Attending: Cardiology | Admitting: Cardiology

## 2022-10-05 ENCOUNTER — Other Ambulatory Visit (HOSPITAL_COMMUNITY): Payer: Self-pay | Admitting: Cardiology

## 2022-10-05 DIAGNOSIS — I6523 Occlusion and stenosis of bilateral carotid arteries: Secondary | ICD-10-CM | POA: Diagnosis present

## 2022-10-14 ENCOUNTER — Other Ambulatory Visit: Payer: Self-pay | Admitting: Medical

## 2022-10-14 MED ORDER — TRULICITY 1.5 MG/0.5ML ~~LOC~~ SOAJ: SUBCUTANEOUS | 3 refills | Status: DC

## 2022-10-16 ENCOUNTER — Other Ambulatory Visit: Payer: Self-pay | Admitting: Medical

## 2022-10-16 MED ORDER — FREESTYLE LIBRE 3 SENSOR MISC: 2 refills | Status: DC

## 2022-10-16 MED ORDER — FREESTYLE LIBRE 3 READER DEVI: 1.0000 | Freq: Every day | 0 refills | Status: DC

## 2022-11-01 ENCOUNTER — Telehealth: Payer: Self-pay | Admitting: Medical

## 2022-11-01 NOTE — Telephone Encounter (Signed)
P.A. TRULICITY 

## 2022-11-03 ENCOUNTER — Other Ambulatory Visit: Payer: Self-pay | Admitting: Medical

## 2022-11-06 NOTE — Telephone Encounter (Signed)
P.A. approved til 10/19/23, sent mychart message

## 2022-11-13 ENCOUNTER — Other Ambulatory Visit: Payer: Self-pay | Admitting: Medical

## 2022-11-13 ENCOUNTER — Other Ambulatory Visit: Payer: Self-pay | Admitting: Cardiology

## 2022-11-13 MED ORDER — FREESTYLE LIBRE 3 SENSOR MISC: 2 refills | Status: DC

## 2022-11-18 ENCOUNTER — Other Ambulatory Visit: Payer: Self-pay | Admitting: Medical

## 2022-11-30 MED ORDER — HYDROCHLOROTHIAZIDE 25 MG PO TABS: 25.0000 mg | ORAL_TABLET | Freq: Every day | ORAL | 0 refills | Status: DC

## 2022-12-02 MED ORDER — FREESTYLE LIBRE 3 SENSOR MISC: 2 refills | Status: DC

## 2022-12-15 ENCOUNTER — Encounter: Payer: Self-pay | Admitting: Medical

## 2022-12-15 ENCOUNTER — Ambulatory Visit (INDEPENDENT_AMBULATORY_CARE_PROVIDER_SITE_OTHER): Payer: 59 | Admitting: Medical

## 2022-12-15 ENCOUNTER — Telehealth: Payer: Self-pay | Admitting: Medical

## 2022-12-15 VITALS — BP 120/68 | HR 73 | Ht 64.5 in | Wt 188.0 lb

## 2022-12-15 DIAGNOSIS — E785 Hyperlipidemia, unspecified: Secondary | ICD-10-CM

## 2022-12-15 DIAGNOSIS — R5383 Other fatigue: Secondary | ICD-10-CM

## 2022-12-15 DIAGNOSIS — E118 Type 2 diabetes mellitus with unspecified complications: Secondary | ICD-10-CM | POA: Diagnosis not present

## 2022-12-15 DIAGNOSIS — I700 Atherosclerosis of aorta: Secondary | ICD-10-CM

## 2022-12-15 DIAGNOSIS — I10 Essential (primary) hypertension: Secondary | ICD-10-CM

## 2022-12-15 DIAGNOSIS — E1169 Type 2 diabetes mellitus with other specified complication: Secondary | ICD-10-CM | POA: Diagnosis not present

## 2022-12-15 DIAGNOSIS — K5903 Drug induced constipation: Secondary | ICD-10-CM

## 2022-12-15 DIAGNOSIS — R1310 Dysphagia, unspecified: Secondary | ICD-10-CM | POA: Insufficient documentation

## 2022-12-15 DIAGNOSIS — G3184 Mild cognitive impairment, so stated: Secondary | ICD-10-CM

## 2022-12-15 DIAGNOSIS — T402X5A Adverse effect of other opioids, initial encounter: Secondary | ICD-10-CM

## 2022-12-15 NOTE — Telephone Encounter (Signed)
Pt's daughter stopped by my window at check out and states she would like her mothers iron levels checked. She is requesting that be added to blood work.

## 2022-12-15 NOTE — Progress Notes (Signed)
Subjective: Chief Complaint  Patient presents with   Diabetes    Fasting med check. Having trouble with her swallowing when she bends her head. Sore on her right breast. Fatigued, no energy. Would like to know about carotid u/s results. Does not want covid booster. Will go to pharmacy for Tdap and Shingrix.    Patient Care Team: Darroll Bredeson, Camelia Eng, PA-C as PCP - General (Family Medicine) Minus Breeding, MD as PCP - Cardiology (Cardiology) Sherrlyn Hock, MD as Consulting Physician Minus Breeding, MD as Consulting Physician (Cardiology) Dr. Derek Jack, hematology Dr. Joni Fears, orthopedics Dr. Margette Fast, neurology Dr. Erline Levine, neurosurgery Simeon Craft, PA-C neurosurgery Dr. Davy Pique neurosurgery   Concerns: Here with daughter Tammy today.  They live together.  Having some trouble swallowing.  Sometimes feels like food get stuck but no vomiting after eating, no heartburn of worse of late.  Sometimes trouble with pill medication  Had recent sore on right breat. Her cat jumped up on her, and had a sore but its mostly resolved.  Fatigued, no energy.  No chest pain, no dyspnea on exertion.  No leg swelling.  Having upcoming dental procedure for tooth abscess.  Did 10 days of antibiotics.    Has some trouble with names, memory at times.    Chronic pain and neuropathy-not currently taking notriptyline, does use Cymbalta and needs a refill.  This really helps.  Sees pain specialist at [facility]  Hypertension-compliant medication without complaint  Hyperlipidemia-compliant medication without complaint  Diabetes - is compliant with medications without complaint  GERD--she continues to use Dexilant, this works ok for her  No other aggravating or relieving factors. No other complaint.    Past Medical History:  Diagnosis Date   Adrenal mass, right ([place])    Allergy    Anemia    Asthma    "as a teenager"   Blind right eye    since childhood    Blood transfusion 1970   Chronic back pain    Chronic diarrhea    Colestipol therapy   Depression    Diverticulitis    Diverticulosis    Fatty liver    Female bladder prolapse    GERD (gastroesophageal reflux disease)    Glaucoma    H/O bone density study 11/29/2014   normal study although mild decreased in density from prior study   H/O hiatal hernia    Hemorrhoids    History of kidney stones    History of uterine cancer    s/p hysterectomy   Hyperlipidemia    Hypertension    Migraine    hx of migraines in past    Neuromuscular disorder (HCC)    diabetic neuropahthy   Numbness and tingling of both legs    Osteoarthritis    Rheumatic fever    age 77   Type II diabetes mellitus ([place])    Urinary incontinence    Vitamin D deficiency    Current Outpatient Medications on File Prior to Visit  Medication Sig Dispense Refill   Accu-Chek FastClix Lancets MISC Test 1-2 times daily 102 each 2   apixaban (ELIQUIS) 5 MG TABS tablet Take 1 tablet (5 mg total) by mouth 2 (two) times daily. 60 tablet 11   atorvastatin (LIPITOR) 40 MG tablet TAKE 1 TABLET(40 MG) BY MOUTH AT BEDTIME 90 tablet 0   Blood Glucose Calibration (ACCU-CHEK INSTANT CONTROL) LIQD Use for controls for accu-chek activa meter 1 each 0   Blood Glucose Monitoring Suppl (ACCU-CHEK GUIDE ME) w/Device  KIT 1 Device by Does not apply route 2 (two) times daily at 8 am and 10 pm. 1 kit 0   brimonidine (ALPHAGAN) 0.2 % ophthalmic solution Place 1 drop into the left eye 2 times daily.     cetirizine (ZYRTEC) 10 MG tablet Take 1 tablet (10 mg total) by mouth daily. Take 1 tablet (10 mg total) by mouth daily. 90 tablet 0   Cholecalciferol (VITAMIN D-3) 25 MCG (1000 UT) CAPS Take 1 capsule (1,000 Units total) by mouth daily. 90 capsule 3   Continuous Blood Gluc Receiver (FREESTYLE LIBRE 3 READER) DEVI 1 each by Does not apply route daily. 1 each 0   Continuous Blood Gluc Sensor (FREESTYLE LIBRE 3 SENSOR) MISC Place one of these  sensor every 14 days 2 each 2   dexlansoprazole (DEXILANT) 60 MG capsule TAKE 1 CAPSULE BY MOUTH TWICE DAILY 180 capsule 0   diltiazem (CARDIZEM) 30 MG tablet TAKE 1 TABLET BY MOUTH TWICE DAILY 180 tablet 3   dorzolamide-timolol (COSOPT) 22.3-6.8 MG/ML ophthalmic solution Place 1 drop into both eyes 2 (two) times daily.     Dulaglutide (TRULICITY) 1.5 0000000 SOPN ADMINISTER 1.5 MG UNDER THE SKIN 1 TIME A WEEK 2 mL 3   DULoxetine (CYMBALTA) 60 MG capsule TAKE 1 CAPSULE(60 MG) BY MOUTH DAILY 90 capsule 0   enalapril (VASOTEC) 20 MG tablet TAKE 1 TABLET(20 MG) BY MOUTH DAILY 90 tablet 0   ferrous gluconate (FERGON) 324 MG tablet TAKE 1 TABLET(324 MG) BY MOUTH DAILY WITH BREAKFAST 90 tablet 0   glucose blood (ACCU-CHEK GUIDE) test strip USE AS INSTRUCTED 150 strip 1   hydrochlorothiazide (HYDRODIURIL) 25 MG tablet Take 1 tablet (25 mg total) by mouth daily. 30 tablet 0   insulin glargine, 1 Unit Dial, (TOUJEO SOLOSTAR) 300 UNIT/ML Solostar Pen Inject 33 Units into the skin daily. 15 mL 3   Insulin Pen Needle (B-D UF III MINI PEN NEEDLES) 31G X 5 MM MISC USE AS DIRECTED TO INJECT TOUJEO Strength: 31G X 5 MM 100 each 1   latanoprost (XALATAN) 0.005 % ophthalmic solution Place 1 drop into the right eye at bedtime.     levETIRAcetam (KEPPRA) 250 MG tablet Take 250 mg by mouth 2 (two) times daily.     linaclotide (LINZESS) 72 MCG capsule TAKE 1 CAPSULE(72 MCG) BY MOUTH DAILY BEFORE BREAKFAST 90 capsule 0   metFORMIN (GLUCOPHAGE) 500 MG tablet TAKE 1 TABLET(500 MG) BY MOUTH TWICE DAILY WITH A MEAL 180 tablet 1   hydrocortisone (PROCTO-MED HC) 2.5 % rectal cream APPLY CREAM RECTALLY TWICE DAILY (Patient not taking: Reported on 12/15/2022) 28 g 1   nortriptyline (PAMELOR) 10 MG capsule Take 1 capsule (10 mg total) by mouth at bedtime. (Patient not taking: Reported on 12/15/2022) 90 capsule 3   triamcinolone (KENALOG) 0.1 % Apply topically. (Patient not taking: Reported on 12/15/2022)     No current  facility-administered medications on file prior to visit.   ROS as in subjective    Objective: BP 120/68   Pulse 73   Ht 5' 4.5" (1.638 m)   Wt 188 lb (85.3 kg) Comment: per patient this am-did not want weigh  SpO2 98%   BMI 31.77 kg/m   Wt Readings from Last 3 Encounters:  12/15/22 188 lb (85.3 kg)  08/10/22 194 lb 9.6 oz (88.3 kg)  02/19/22 188 lb 6.4 oz (85.5 kg)   General appearence: alert, no distress, WD/WN,  Neck: supple, no lymphadenopathy, no thyromegaly, no masses Heart: RRR, normal  S1, S2, no murmurs Lungs: CTA bilaterally, no wheezes, rhonchi, or rales Pulses: 2+ symmetric, upper and 1+ lower extremities, normal cap refill Ext: no edema, cyanosis or clubbing    Assessment: Encounter Diagnoses  Name Primary?   Diabetes mellitus with complication (HCC) Yes   Essential hypertension, benign    Aortic atherosclerosis ([place])    Dyslipidemia associated with type 2 diabetes mellitus ([place])    Other fatigue    Dysphagia, unspecified type    Mild cognitive impairment    Therapeutic opioid induced constipation     Diabetes - updated labs today Continue glucose monitoring Continue Trulicity weekly injection 1.5 mg, metformin 500 mg twice daily  Hyperlipidemia associate with diabetes Currently on Lipitor '40mg'$  I will discuss with cardiology.  She had a recent ultrasound of her carotids last year that had changes from the year before to the right ICA showing 60 to 79% stenosis compared to less than 59% the year before  Hypertension  Continue hydrochlorothiazide 25 mg daily, enalapril 20 mg daily, diltiazem 30 mg twice daily   Fatigue - discussed many possibilities Consider updated sleep study or overnight oximetry Polypharmacy as a possible cause Updated vitamin D labs today, other labs as well today to evaluate 2017 sleep study reviewed.  She has a smart apple watch and it does sow some oxygein readings below 90 during sleep, but most readings are above  90%   Dysphagia  Consider swallow study I reviewed last EGD from 2015  History of atrial fibrillation and long-term anticoagulation continue on Eliquis 5 mg twice daily She will need to discontinue this about 5 days prior to her upcoming dental procedure   Chronic pain Sees neurosurgery Per neurosurgery notes from December 2023 she was continued on morphine 15 mg ER daily and Morphine regular release 15 mg twice daily She was also continued on Celebrex 200 mg daily although we have advised her against this due to risk for kidneys and due to the fact that she is on Eliquis  Constipation she was continued on Symproic 0.2 mg daily per neurosurgery as well as Linzess  Mild cognitive impairment.  MMSE 30 out of 30 today.  We discussed possible referral to neurology for baseline consult.  We discussed coping strategies to deal with memory and mild cognitive delay   Arvena was seen today for diabetes.  Diagnoses and all orders for this visit:  Diabetes mellitus with complication ([place]) -     Hemoglobin A1c -     Comprehensive metabolic panel  Essential hypertension, benign  Aortic atherosclerosis (HCC)  Dyslipidemia associated with type 2 diabetes mellitus (HCC) -     Lipid panel  Other fatigue -     CBC -     TSH -     VITAMIN D 25 Hydroxy (Vit-D Deficiency, Fractures) -     Iron  Dysphagia, unspecified type -     DG SWALLOW STUDY OP MEDICARE SPEECH PATH; Future  Mild cognitive impairment  Therapeutic opioid induced constipation  Spent > 45 minutes face to face with patient in discussion of symptoms, evaluation, plan and recommendations.    F/u pending labs

## 2022-12-15 NOTE — Telephone Encounter (Signed)
Added by [facility] in lab.

## 2022-12-16 ENCOUNTER — Other Ambulatory Visit: Payer: Self-pay | Admitting: Medical

## 2022-12-16 LAB — COMPREHENSIVE METABOLIC PANEL
ALT: 23 IU/L (ref 0–32)
AST: 22 IU/L (ref 0–40)
Albumin/Globulin Ratio: 1.7 (ref 1.2–2.2)
Albumin: 4.3 g/dL (ref 3.8–4.8)
Alkaline Phosphatase: 98 IU/L (ref 44–121)
BUN/Creatinine Ratio: 23 (ref 12–28)
BUN: 19 mg/dL (ref 8–27)
Bilirubin Total: 0.2 mg/dL (ref 0.0–1.2)
CO2: 25 mmol/L (ref 20–29)
Calcium: 9.9 mg/dL (ref 8.7–10.3)
Chloride: 101 mmol/L (ref 96–106)
Creatinine, Ser: 0.83 mg/dL (ref 0.57–1.00)
Globulin, Total: 2.6 g/dL (ref 1.5–4.5)
Glucose: 103 mg/dL — ABNORMAL HIGH (ref 70–99)
Potassium: 4.9 mmol/L (ref 3.5–5.2)
Sodium: 141 mmol/L (ref 134–144)
Total Protein: 6.9 g/dL (ref 6.0–8.5)
eGFR: 73 mL/min/{1.73_m2} (ref >59)

## 2022-12-16 LAB — LIPID PANEL
Chol/HDL Ratio: 2.4 ratio (ref 0.0–4.4)
Cholesterol, Total: 141 mg/dL (ref 100–199)
HDL: 59 mg/dL (ref >39)
LDL Chol Calc (NIH): 61 mg/dL (ref 0–99)
Triglycerides: 119 mg/dL (ref 0–149)
VLDL Cholesterol Cal: 21 mg/dL (ref 5–40)

## 2022-12-16 LAB — CBC
Hematocrit: 36.7 % (ref 34.0–46.6)
Hemoglobin: 11.8 g/dL (ref 11.1–15.9)
MCH: 27.1 pg (ref 26.6–33.0)
MCHC: 32.2 g/dL (ref 31.5–35.7)
MCV: 84 fL (ref 79–97)
Platelets: 351 10*3/uL (ref 150–450)
RBC: 4.36 x10E6/uL (ref 3.77–5.28)
RDW: 12 % (ref 11.7–15.4)
WBC: 10.2 10*3/uL (ref 3.4–10.8)

## 2022-12-16 LAB — HEMOGLOBIN A1C
Est. average glucose Bld gHb Est-mCnc: 180 mg/dL
Hgb A1c MFr Bld: 7.9 % — ABNORMAL HIGH (ref 4.8–5.6)

## 2022-12-16 LAB — IRON: Iron: 43 ug/dL (ref 27–139)

## 2022-12-16 LAB — VITAMIN D 25 HYDROXY (VIT D DEFICIENCY, FRACTURES): Vit D, 25-Hydroxy: 20.6 ng/mL — ABNORMAL LOW (ref 30.0–100.0)

## 2022-12-16 LAB — TSH: TSH: 1.13 u[IU]/mL (ref 0.450–4.500)

## 2022-12-16 MED ORDER — ENALAPRIL MALEATE 20 MG PO TABS: ORAL_TABLET | ORAL | 3 refills | Status: DC

## 2022-12-16 MED ORDER — DEXLANSOPRAZOLE 60 MG PO CPDR: 60.0000 mg | DELAYED_RELEASE_CAPSULE | Freq: Every day | ORAL | 3 refills | Status: DC

## 2022-12-16 MED ORDER — HYDROCHLOROTHIAZIDE 25 MG PO TABS: 12.5000 mg | ORAL_TABLET | Freq: Every day | ORAL | 0 refills | Status: DC

## 2022-12-16 MED ORDER — DULOXETINE HCL 60 MG PO CPEP: ORAL_CAPSULE | ORAL | 1 refills | Status: DC

## 2022-12-16 MED ORDER — VITAMIN D 50 MCG (2000 UT) PO CAPS: 1.0000 | ORAL_CAPSULE | Freq: Every day | ORAL | 3 refills | Status: DC

## 2022-12-16 NOTE — Telephone Encounter (Signed)
Left detailed message for patient on voicemail.

## 2022-12-16 NOTE — Progress Notes (Signed)
Hello Dr. Percival Spanish  I saw Robyn Hill yesterday.  She was concerned that although on statin her carotid US shows significant worsening of right carotid compared to year ago.  So I was curious of your thoughts on adding something like Repatha, and/or consult with vascular?    She is on so many medicaiton already, I hate to add yet another.  I appreciate your feedback. Robyn Hill

## 2022-12-16 NOTE — Progress Notes (Signed)
Results sent through MyChart

## 2022-12-16 NOTE — Telephone Encounter (Signed)
See lab message from today.  In addition to those recommendations,lets cut the fluid pill hydrochlorothiazide in half. I changed dexilant to once daily as its not good to be on this twice daily  These little changes may potentially help the fatigue concern

## 2022-12-17 ENCOUNTER — Other Ambulatory Visit: Payer: Self-pay | Admitting: Medical

## 2022-12-17 NOTE — Telephone Encounter (Signed)
Sending to different pharmacy

## 2022-12-21 ENCOUNTER — Telehealth: Payer: Self-pay | Admitting: Medical

## 2022-12-21 ENCOUNTER — Other Ambulatory Visit: Payer: Self-pay | Admitting: Medical

## 2022-12-21 ENCOUNTER — Encounter: Payer: Self-pay | Admitting: *Deleted

## 2022-12-21 ENCOUNTER — Other Ambulatory Visit: Payer: Self-pay | Admitting: *Deleted

## 2022-12-21 DIAGNOSIS — R1310 Dysphagia, unspecified: Secondary | ICD-10-CM

## 2022-12-21 DIAGNOSIS — I6523 Occlusion and stenosis of bilateral carotid arteries: Secondary | ICD-10-CM

## 2022-12-21 NOTE — Telephone Encounter (Signed)
Pt states you were going to set her up for a swallow test and she hasn't heard anything yet,  please advise

## 2022-12-21 NOTE — Telephone Encounter (Signed)
Left message for pt to call me back 

## 2022-12-21 NOTE — Progress Notes (Signed)
nmpn

## 2022-12-22 NOTE — Telephone Encounter (Signed)
Pt was notified.  

## 2022-12-23 ENCOUNTER — Other Ambulatory Visit: Payer: Self-pay | Admitting: Medical

## 2022-12-23 DIAGNOSIS — E118 Type 2 diabetes mellitus with unspecified complications: Secondary | ICD-10-CM

## 2022-12-23 MED ORDER — TRULICITY 3 MG/0.5ML ~~LOC~~ SOAJ: 3.0000 mg | SUBCUTANEOUS | 3 refills | Status: DC

## 2022-12-23 MED ORDER — TOUJEO SOLOSTAR 300 UNIT/ML ~~LOC~~ SOPN: 50.0000 [IU] | PEN_INJECTOR | Freq: Every day | SUBCUTANEOUS | 3 refills | Status: DC

## 2022-12-23 NOTE — Progress Notes (Signed)
Begin higher dose of Trulicity sent.

## 2022-12-25 NOTE — Telephone Encounter (Signed)
done

## 2022-12-29 DIAGNOSIS — R1310 Dysphagia, unspecified: Secondary | ICD-10-CM

## 2022-12-29 MED ORDER — ENALAPRIL MALEATE 20 MG PO TABS: ORAL_TABLET | ORAL | 1 refills | Status: DC

## 2022-12-31 ENCOUNTER — Other Ambulatory Visit: Payer: Self-pay | Admitting: Medical

## 2022-12-31 ENCOUNTER — Telehealth: Payer: Self-pay | Admitting: Medical

## 2022-12-31 MED ORDER — HYDROCHLOROTHIAZIDE 25 MG PO TABS: 12.5000 mg | ORAL_TABLET | Freq: Every day | ORAL | 2 refills | Status: DC

## 2022-12-31 MED ORDER — CELECOXIB 200 MG PO CAPS: 200.0000 mg | ORAL_CAPSULE | Freq: Every day | ORAL | 0 refills | Status: DC

## 2022-12-31 NOTE — Telephone Encounter (Signed)
Robyn Hill to schedule their annual wellness visit. Call back at later date: march [place] direct phone # [phone]   Spoke with patient to schedule her awv.  She stated this was done @ her 12/15/22 appt  patient was not due till after 12/20/22.  I sent message to Pamala Hurry to see if patient can have awv

## 2023-01-01 MED ORDER — FREESTYLE LIBRE 3 SENSOR MISC: 5 refills | Status: DC

## 2023-01-04 NOTE — Addendum Note (Signed)
Addended by: Minette Headland A on: 01/04/2023 01:25 PM   Modules accepted: Orders

## 2023-01-07 NOTE — Addendum Note (Signed)
Addended by: Minette Headland A on: 01/07/2023 02:29 PM   Modules accepted: Orders

## 2023-01-18 ENCOUNTER — Other Ambulatory Visit (HOSPITAL_COMMUNITY): Payer: Self-pay | Admitting: Occupational Therapy

## 2023-01-18 DIAGNOSIS — K219 Gastro-esophageal reflux disease without esophagitis: Secondary | ICD-10-CM

## 2023-01-18 DIAGNOSIS — R1312 Dysphagia, oropharyngeal phase: Secondary | ICD-10-CM

## 2023-01-19 MED ORDER — DEXLANSOPRAZOLE 60 MG PO CPDR: 1.0000 | DELAYED_RELEASE_CAPSULE | Freq: Two times a day (BID) | ORAL | 1 refills | Status: DC

## 2023-01-19 MED ORDER — ATORVASTATIN CALCIUM 40 MG PO TABS: ORAL_TABLET | ORAL | 1 refills | Status: DC

## 2023-01-19 NOTE — Telephone Encounter (Signed)
Please advise if pt still needs to be on iron med. I do not see this is on active med list

## 2023-01-21 MED ORDER — FERROUS GLUCONATE 324 (38 FE) MG PO TABS: 324.0000 mg | ORAL_TABLET | Freq: Every day | ORAL | 1 refills | Status: DC

## 2023-01-22 ENCOUNTER — Other Ambulatory Visit: Payer: Self-pay | Admitting: Medical

## 2023-01-22 MED ORDER — CELECOXIB 200 MG PO CAPS: 200.0000 mg | ORAL_CAPSULE | Freq: Every day | ORAL | 0 refills | Status: DC

## 2023-01-22 MED ORDER — ATORVASTATIN CALCIUM 40 MG PO TABS: 40.0000 mg | ORAL_TABLET | Freq: Every day | ORAL | 0 refills | Status: DC

## 2023-01-26 MED ORDER — LINACLOTIDE 72 MCG PO CAPS: ORAL_CAPSULE | ORAL | 0 refills | Status: DC

## 2023-01-26 MED ORDER — DULOXETINE HCL 60 MG PO CPEP: ORAL_CAPSULE | ORAL | 1 refills | Status: DC

## 2023-02-08 ENCOUNTER — Encounter (HOSPITAL_COMMUNITY): Payer: Self-pay | Admitting: Speech Pathology

## 2023-02-08 ENCOUNTER — Ambulatory Visit (HOSPITAL_COMMUNITY): Payer: 59 | Attending: Medical | Admitting: Speech Pathology

## 2023-02-08 ENCOUNTER — Ambulatory Visit (HOSPITAL_COMMUNITY)
Admission: RE | Admit: 2023-02-08 | Discharge: 2023-02-08 | Disposition: A | Discharge status: Home / Self Care | Payer: 59 | Source: Ambulatory Visit | Attending: Medical | Admitting: Medical

## 2023-02-08 ENCOUNTER — Other Ambulatory Visit: Payer: Self-pay | Admitting: Medical

## 2023-02-08 DIAGNOSIS — R1312 Dysphagia, oropharyngeal phase: Secondary | ICD-10-CM | POA: Diagnosis present

## 2023-02-08 DIAGNOSIS — K219 Gastro-esophageal reflux disease without esophagitis: Secondary | ICD-10-CM | POA: Insufficient documentation

## 2023-02-08 DIAGNOSIS — R1310 Dysphagia, unspecified: Secondary | ICD-10-CM | POA: Diagnosis not present

## 2023-02-08 NOTE — Therapy (Signed)
Modified Barium Swallow Study  Patient Details  Name: Robyn Hill MRN: [id number] Date of Birth: 1946-02-15  Today's Date: 02/08/2023  Modified Barium Swallow completed.  Full report located under Chart Review in the Imaging Section.  History of Present Illness Robyn Hill is a 77 yo female who was referred for MBSS by Kermit Balo. Tysinger, PA-C due to Pt reports of difficulty swallowing certain solids foods (corn) and feeling of regurgitation at night when she is in bed. She takes Dexilant for GERD symptoms and sleeps on extra pillows.   Clinical Impression Oropharyngeal swallow is essentially WNL with swallow trigger at times at the valleculae, min decreased tongue base retraction resulting in trace/mild amount of residuals along base of tongue/valleculae, which Pt independently clears with a repeat/dry swallow. Pt with mildly pronounced cricopharyngeus, however it did not obstruct bolus flow. She took the barium tablet whole in puree, as this is what she does at home. Esophageal sweep was completed and unremarkable. Pt reports difficulty with corn pieces and globus sensation greater at night when sleeping. Recommend regular textures and thin liquids with pills whole in puree and follow with liquid wash and Pt was encouraged to elevate the head of her bed with bed risers, as opposed to just using extra pillows to allow benefit of gravity throughout her torso. Pt was given my contact information should she have further questions. Factors that may increase risk of adverse event in presence of aspiration Rubye Oaks & Clearance Coots 2021):  (none)  Swallow Evaluation Recommendations Recommendations: PO diet PO Diet Recommendation: Regular;Thin liquids (Level 0) Liquid Administration via: Cup;Straw Medication Administration: Whole meds with puree Supervision: Patient able to self-feed Swallowing strategies  : Small bites/sips Postural changes: Position pt fully upright for meals;Stay upright 30-60 min  after meals (elevate HOB at night) Oral care recommendations: Oral care BID (2x/day);Pt independent with oral care     Thank you,  Havery Moros, CCC-SLP [phone]  Keyler Hoge 02/08/2023,3:59 PM

## 2023-02-08 NOTE — Progress Notes (Signed)
Swallow study was mostly normal  Some recommendations per speech therapist:  Thin liquids, not thicker liquids such as milk shakes or thick purees  Liquid Administration via: Cup; Straw  Medication Administration: Whole meds with puree  Swallowing strategies: Small bites/sips  Stay upright for meals, not lying in bed or reclined for example  Stay upright 30-60 min after meals, elevate head of bed at night

## 2023-02-17 ENCOUNTER — Ambulatory Visit (INDEPENDENT_AMBULATORY_CARE_PROVIDER_SITE_OTHER): Payer: 59 | Admitting: Medical

## 2023-02-17 VITALS — BP 120/70 | HR 82 | Wt 192.6 lb

## 2023-02-17 DIAGNOSIS — M5442 Lumbago with sciatica, left side: Secondary | ICD-10-CM

## 2023-02-17 DIAGNOSIS — E118 Type 2 diabetes mellitus with unspecified complications: Secondary | ICD-10-CM | POA: Diagnosis not present

## 2023-02-17 DIAGNOSIS — I700 Atherosclerosis of aorta: Secondary | ICD-10-CM | POA: Diagnosis not present

## 2023-02-17 DIAGNOSIS — G8929 Other chronic pain: Secondary | ICD-10-CM

## 2023-02-17 DIAGNOSIS — R39198 Other difficulties with micturition: Secondary | ICD-10-CM | POA: Insufficient documentation

## 2023-02-17 DIAGNOSIS — E1169 Type 2 diabetes mellitus with other specified complication: Secondary | ICD-10-CM

## 2023-02-17 DIAGNOSIS — R32 Unspecified urinary incontinence: Secondary | ICD-10-CM | POA: Diagnosis not present

## 2023-02-17 DIAGNOSIS — R1310 Dysphagia, unspecified: Secondary | ICD-10-CM

## 2023-02-17 DIAGNOSIS — I10 Essential (primary) hypertension: Secondary | ICD-10-CM

## 2023-02-17 DIAGNOSIS — E785 Hyperlipidemia, unspecified: Secondary | ICD-10-CM

## 2023-02-17 DIAGNOSIS — Z7185 Encounter for immunization safety counseling: Secondary | ICD-10-CM

## 2023-02-17 DIAGNOSIS — E1142 Type 2 diabetes mellitus with diabetic polyneuropathy: Secondary | ICD-10-CM

## 2023-02-17 DIAGNOSIS — E559 Vitamin D deficiency, unspecified: Secondary | ICD-10-CM

## 2023-02-17 DIAGNOSIS — E162 Hypoglycemia, unspecified: Secondary | ICD-10-CM | POA: Insufficient documentation

## 2023-02-17 DIAGNOSIS — M1990 Unspecified osteoarthritis, unspecified site: Secondary | ICD-10-CM

## 2023-02-17 LAB — VITAMIN D 25 HYDROXY (VIT D DEFICIENCY, FRACTURES)

## 2023-02-17 LAB — POCT URINALYSIS DIP (PROADVANTAGE DEVICE)
Bilirubin, UA: NEGATIVE
Blood, UA: NEGATIVE
Glucose, UA: NEGATIVE mg/dL
Ketones, POC UA: NEGATIVE mg/dL
Nitrite, UA: NEGATIVE
Protein Ur, POC: NEGATIVE mg/dL
Specific Gravity, Urine: 1.015
Urobilinogen, Ur: NEGATIVE
pH, UA: 6 (ref 5.0–8.0)

## 2023-02-17 NOTE — Progress Notes (Signed)
Subjective:  Robyn Hill is a 77 y.o. female who presents for Chief Complaint  Patient presents with   diabetes    Diabetes, feels like she is having urinary issues again     Patient Care Team: Siobhan Zaro, Kermit Balo, PA-C as PCP - General (Family Medicine) Rollene Rotunda, MD as PCP - Cardiology (Cardiology) Mikal Plane, MD as Consulting Physician Rollene Rotunda, MD as Consulting Physician (Cardiology) Dr. Doreatha Massed, hematology Dr. Norlene Campbell, orthopedics Dr. Stephanie Acre, neurology Dr. Maeola Harman, neurosurgery Celedonio Miyamoto, PA-C neurosurgery Dr. Lorrine Kin neurosurgery Dr. Alfredo Martinez, urology   Concerns: Here for med check, accompanied by no one.    Having some urinary issues.  Hard time urinating sometimes.   Other times has pressure but then all of the sudden has incontinence of urine.   No blood in urine, no odor in urine.  Flow isn't like it should be, has to bend forward.    Has hx/o constipation.  Uses linzess maybe 2-3 days per week.  Bowels going ok for the most part.  No bowel incontinence.     Diabetes Compliant with Toujeo 50u daily unless sugars are looking lower, sometimes only 20 u at night.  Trulicity weekly, Metformin 500mg  BID.  Has had a few low readings in recent weeks, one at 53 and one reading at 64.   Uses Freestyle libre.   Vitamin D deficiency - we added Vit D 2000u daily in 11/2022.  Here for recheck on this.  Hyperlipidemia - compliant with Lipitor 40mg  daily.  Chronic pain, arthritis - taking Celebrex 200mg  daily.  Taking Cymbalta 60mg .    Hypertension - current doing 1/2 tablet hydrochlorothiazide from last visit.  No issues with this, BPs looking ok.  Still taking Diltiazem 30mg  twice daily, Enalapril 20mg  daily.  Needs handicap placard renewed.  No other aggravating or relieving factors.    No other c/o.  Past Medical History:  Diagnosis Date   Adrenal mass, right (HCC)    Allergy    Anemia    Asthma    "as a  teenager"   Blind right eye    since childhood   Blood transfusion 1970   Chronic back pain    Chronic diarrhea    Colestipol therapy   Depression    Diverticulitis    Diverticulosis    Fatty liver    Female bladder prolapse    GERD (gastroesophageal reflux disease)    Glaucoma    H/O bone density study 11/29/2014   normal study although mild decreased in density from prior study   H/O hiatal hernia    Hemorrhoids    History of kidney stones    History of uterine cancer    s/p hysterectomy   Hyperlipidemia    Hypertension    Migraine    hx of migraines in past    Neuromuscular disorder (HCC)    diabetic neuropahthy   Numbness and tingling of both legs    Osteoarthritis    Rheumatic fever    age 75   Type II diabetes mellitus (HCC)    Urinary incontinence    Vitamin D deficiency    Current Outpatient Medications on File Prior to Visit  Medication Sig Dispense Refill   atorvastatin (LIPITOR) 40 MG tablet TAKE 1 TABLET(40 MG) BY MOUTH AT BEDTIME 90 tablet 1   brimonidine (ALPHAGAN) 0.2 % ophthalmic solution Place 1 drop into the left eye 2 times daily.     celecoxib (CELEBREX) 200 MG capsule Take  1 capsule (200 mg total) by mouth daily. 90 capsule 0   Cholecalciferol (VITAMIN D) 50 MCG (2000 UT) CAPS Take 1 capsule (2,000 Units total) by mouth daily. 90 capsule 3   Continuous Blood Gluc Sensor (FREESTYLE LIBRE 3 SENSOR) MISC Place one of these sensor every 14 days 2 each 5   dexlansoprazole (DEXILANT) 60 MG capsule Take 1 capsule (60 mg total) by mouth 2 (two) times daily. 180 capsule 1   diltiazem (CARDIZEM) 30 MG tablet TAKE 1 TABLET BY MOUTH TWICE DAILY 180 tablet 3   dorzolamide-timolol (COSOPT) 22.3-6.8 MG/ML ophthalmic solution Place 1 drop into both eyes 2 (two) times daily.     Dulaglutide (TRULICITY) 3 MG/0.5ML SOPN Inject 3 mg as directed once a week. 2 mL 3   DULoxetine (CYMBALTA) 60 MG capsule TAKE 1 CAPSULE(60 MG) BY MOUTH DAILY 90 capsule 1   ELIQUIS 5 MG TABS  tablet TAKE 1 TABLET BY MOUTH TWICE DAILY 60 tablet 4   enalapril (VASOTEC) 20 MG tablet TAKE 1 TABLET(20 MG) BY MOUTH DAILY 90 tablet 1   ferrous gluconate (FERGON) 324 MG tablet Take 1 tablet (324 mg total) by mouth daily with breakfast. 30 tablet 1   hydrochlorothiazide (HYDRODIURIL) 25 MG tablet Take 0.5 tablets (12.5 mg total) by mouth daily. 90 tablet 2   insulin glargine, 1 Unit Dial, (TOUJEO SOLOSTAR) 300 UNIT/ML Solostar Pen Inject 50 Units into the skin daily. 15 mL 3   Insulin Pen Needle (B-D UF III MINI PEN NEEDLES) 31G X 5 MM MISC USE AS DIRECTED TO INJECT TOUJEO Strength: 31G X 5 MM 100 each 1   latanoprost (XALATAN) 0.005 % ophthalmic solution Place 1 drop into the right eye at bedtime.     linaclotide (LINZESS) 72 MCG capsule TAKE 1 CAPSULE(72 MCG) BY MOUTH DAILY BEFORE BREAKFAST 90 capsule 0   metFORMIN (GLUCOPHAGE) 500 MG tablet TAKE 1 TABLET(500 MG) BY MOUTH TWICE DAILY WITH A MEAL 180 tablet 1   No current facility-administered medications on file prior to visit.     The following portions of the patient's history were reviewed and updated as appropriate: allergies, current medications, past family history, past medical history, past social history, past surgical history and problem list.  ROS Otherwise as in subjective above    Objective: BP 120/70   Pulse 82   Wt 192 lb 9.6 oz (87.4 kg)   BMI 32.55 kg/m   BP Readings from Last 3 Encounters:  02/17/23 120/70  12/15/22 120/68  08/10/22 122/68   Wt Readings from Last 3 Encounters:  02/17/23 192 lb 9.6 oz (87.4 kg)  12/15/22 188 lb (85.3 kg)  08/10/22 194 lb 9.6 oz (88.3 kg)    General appearance: alert, no distress, well developed, well nourished Neck: supple, no lymphadenopathy, no thyromegaly, no masses Heart: RRR, normal S1, S2, no murmurs Lungs: CTA bilaterally, no wheezes, rhonchi, or rales Abdomen: +bs, soft, non tender, non distended, no masses, no hepatomegaly, no splenomegaly Pulses: 2+ radial  pulses, 1 pedal pulses, normal cap refill Ext: no edema     Assessment: Encounter Diagnoses  Name Primary?   Essential hypertension, benign Yes   Aortic atherosclerosis (HCC)    Diabetes mellitus with complication (HCC)    Dyslipidemia associated with type 2 diabetes mellitus (HCC)    Diabetic polyneuropathy associated with type 2 diabetes mellitus (HCC)    Vaccine counseling    Decreased urine stream    Urinary incontinence, unspecified type    Vitamin D deficiency  Dysphagia, unspecified type    Arthritis    Chronic bilateral low back pain with left-sided sciatica    Hypoglycemia      Plan: Hypertension Continue current medications, follow-up with cardiology as scheduled Continue diltiazem 30 mg twice daily, enalapril 20 mg daily, hydrochlorothiazide 25 mg, 1/2 tablet daily, and continue Eliquis 5 mg twice daily  Aortic atherosclerosis, hyperlipidemia associate with diabetes Continue atorvastatin 40 mg daily  Diabetes We want to avoid hypoglycemic episodes Continue monitoring with the freestyle libre Continue Trulicity weekly  continue metformin 500 mg twice daily Continue Toujeo but cut back from 50 units to 45 units daily and lets monitor sugars for the time being and see if this helps [place] avoid hypoglycemic episodes   Decreased urine stream, urinary incontinence No obvious infection today Given history of bladder prolapse, follow-up with urology soon to discuss treatment options   Vaccine counseling I recommend updated shingles and tetanus vaccine at your pharmacy soon   We will have you sign up for a copy of your last eye exam today from August 2023   Arthritis, chronic pain We discussed the risk and benefits of the current medications For now she will continue Celebrex 2 mg daily, Cymbalta 60 mg daily Handicap placard form renewed  Swallowing difficulties Refer to ENT Recent swallow study was reviewed  Avira was seen today for  diabetes.  Diagnoses and all orders for this visit:  Essential hypertension, benign  Aortic atherosclerosis (HCC)  Diabetes mellitus with complication (HCC) -     Hemoglobin A1c  Dyslipidemia associated with type 2 diabetes mellitus (HCC)  Diabetic polyneuropathy associated with type 2 diabetes mellitus (HCC)  Vaccine counseling  Decreased urine stream -     POCT Urinalysis DIP (Proadvantage Device)  Urinary incontinence, unspecified type -     POCT Urinalysis DIP (Proadvantage Device)  Vitamin D deficiency -     VITAMIN D 25 Hydroxy (Vit-D Deficiency, Fractures)  Dysphagia, unspecified type -     Cancel: Ambulatory referral to ENT -     Ambulatory referral to ENT  Arthritis  Chronic bilateral low back pain with left-sided sciatica  Hypoglycemia    Follow up: pending labs, referral

## 2023-02-17 NOTE — Progress Notes (Signed)
Sent note to [facility]

## 2023-02-18 ENCOUNTER — Other Ambulatory Visit: Payer: Self-pay | Admitting: Medical

## 2023-02-18 LAB — HEMOGLOBIN A1C
Est. average glucose Bld gHb Est-mCnc: 171 mg/dL
Hgb A1c MFr Bld: 7.6 % — ABNORMAL HIGH (ref 4.8–5.6)

## 2023-02-18 NOTE — Progress Notes (Signed)
Results sent through MyChart

## 2023-02-28 NOTE — Progress Notes (Unsigned)
  Cardiology Office Note:   Date:  03/01/2023  ID:  Robyn Hill, DOB 30-Nov-1945, MRN [id number]  History of Present Illness:   Robyn Hill is a 77 y.o. female who presents for evaluation of possible atrial fib. When I saw her in the office for evaluation of this possibly seen on an EKG at her primary care office I thought that this was PAT.  She wore a monitor.   I did not see atrial fib on this monitor.  However, this was subsequently found on an Watch.  I reviewed readings again today.  I do see a brief episode of A-fib in February and some alerts that says she was in A-fib but none of the other strips I see her atrial fibrillation.  If she is going in and out of that she is not really feeling it. The patient denies any new symptoms such as chest discomfort, neck or arm discomfort. There has been no new shortness of breath, PND or orthopnea. There have been no reported palpitations, presyncope or syncope.  ROS: As stated in the HPI and negative for all other systems.  Studies Reviewed:    EKG: Sinus rhythm, rate 93, left axis deviation, no acute ST-T wave changes.   Risk Assessment/Calculations:    CHA2DS2-VASc Score = 5  This indicates a 7.2% annual risk of stroke. The patient's score is based upon: CHF History: 0 HTN History: 1 Diabetes History: 1 Stroke History: 0 Vascular Disease History: 0 Age Score: 2 Gender Score: 1             Physical Exam:   VS:  BP 124/72   Pulse 93   Ht 5' 4.5" (1.638 m)   Wt 194 lb 3.2 oz (88.1 kg)   SpO2 93%   BMI 32.82 kg/m    Wt Readings from Last 3 Encounters:  03/01/23 194 lb 3.2 oz (88.1 kg)  02/17/23 192 lb 9.6 oz (87.4 kg)  12/15/22 188 lb (85.3 kg)     GEN: Well nourished, well developed in no acute distress NECK: No JVD; No carotid bruits CARDIAC: 2 out of 6 apical systolic murmur radiating slightly at aortic outflow tract, no diastolic  murmurs, rubs, gallops, regular rate and rhythm RESPIRATORY:  Clear to auscultation  without rales, wheezing or rhonchi  ABDOMEN: Soft, non-tender, non-distended EXTREMITIES:  No edema; No deformity   ASSESSMENT AND PLAN:    CAROTID STENOSIS:    Right stenosis is 60 - 79% in Dec 2023.  I would like her to be followed at [facility] and I will make this referral   ATRIAL FIB:   She does appear to have atrial fibrillation on her watch.  [name] has a CHA2DS2 - VASc score of 5.  She probably is having some brief paroxysms.  I will have her stay on the Cardizem 30 twice daily and take an extra 30 mg as needed if she is having any sustained arrhythmias but so far she has not needed this.   MURMUR:    She has mild aortic valve calcification and mild MR on echo in April 2023.  I will follow this clinically.       Signed, Rollene Rotunda, MD

## 2023-03-01 ENCOUNTER — Ambulatory Visit: Payer: 59 | Attending: Cardiology | Admitting: Cardiology

## 2023-03-01 ENCOUNTER — Encounter: Payer: Self-pay | Admitting: Cardiology

## 2023-03-01 VITALS — BP 124/72 | HR 93 | Ht 64.5 in | Wt 194.2 lb

## 2023-03-01 DIAGNOSIS — I4891 Unspecified atrial fibrillation: Secondary | ICD-10-CM | POA: Diagnosis not present

## 2023-03-01 DIAGNOSIS — R011 Cardiac murmur, unspecified: Secondary | ICD-10-CM

## 2023-03-01 DIAGNOSIS — I6523 Occlusion and stenosis of bilateral carotid arteries: Secondary | ICD-10-CM | POA: Diagnosis not present

## 2023-03-01 NOTE — Patient Instructions (Signed)
Medication Instructions:  Your physician recommends that you continue on your current medications as directed. Please refer to the Current Medication list given to you today.  *If you need a refill on your cardiac medications before your next appointment, please call your pharmacy*  Follow-Up: At [facility], you and your health needs are our priority.  As part of our continuing mission to provide you with exceptional heart care, we have created designated Provider Care Teams.  These Care Teams include your primary Cardiologist (physician) and Advanced Practice Providers (APPs -  Physician Assistants and Nurse Practitioners) who all work together to provide you with the care you need, when you need it.  We recommend signing up for the patient portal called "MyChart".  Sign up information is provided on this After Visit Summary.  MyChart is used to connect with patients for Virtual Visits (Telemedicine).  Patients are able to view lab/test results, encounter notes, upcoming appointments, etc.  Non-urgent messages can be sent to your provider as well.   To learn more about what you can do with MyChart, go to [email or web address].    Your next appointment:   12 month(s)  Provider:   James Hochrein, MD     

## 2023-03-02 LAB — NMR, LIPOPROFILE
Cholesterol, Total: 134 mg/dL (ref 100–199)
HDL Particle Number: 37.9 umol/L (ref >=30.5)
HDL-C: 55 mg/dL (ref >39)
LDL Particle Number: 700 nmol/L (ref <1000)
LDL Size: 21.4 nm (ref >20.5)
LDL-C (NIH Calc): 59 mg/dL (ref 0–99)
LP-IR Score: 39 (ref <=45)
Small LDL Particle Number: 288 nmol/L (ref <=527)
Triglycerides: 108 mg/dL (ref 0–149)

## 2023-03-02 LAB — LIPOPROTEIN A (LPA): Lipoprotein (a): 26 nmol/L (ref <75.0)

## 2023-03-04 ENCOUNTER — Encounter: Payer: Self-pay | Admitting: *Deleted

## 2023-03-09 ENCOUNTER — Encounter: Payer: Self-pay | Admitting: Medical

## 2023-03-30 ENCOUNTER — Other Ambulatory Visit: Payer: Self-pay | Admitting: Medical

## 2023-05-11 ENCOUNTER — Other Ambulatory Visit: Payer: Self-pay | Admitting: Medical

## 2023-05-26 ENCOUNTER — Other Ambulatory Visit: Payer: Self-pay | Admitting: Medical

## 2023-05-31 ENCOUNTER — Other Ambulatory Visit: Payer: Self-pay | Admitting: Medical

## 2023-06-10 ENCOUNTER — Other Ambulatory Visit (HOSPITAL_COMMUNITY): Payer: Self-pay | Admitting: Urology

## 2023-06-10 DIAGNOSIS — R1031 Right lower quadrant pain: Secondary | ICD-10-CM

## 2023-06-11 ENCOUNTER — Other Ambulatory Visit: Payer: Self-pay | Admitting: Medical

## 2023-06-23 ENCOUNTER — Encounter (HOSPITAL_COMMUNITY): Payer: Self-pay

## 2023-06-23 ENCOUNTER — Ambulatory Visit (HOSPITAL_COMMUNITY)
Admission: RE | Admit: 2023-06-23 | Discharge: 2023-06-23 | Disposition: A | Discharge status: Home / Self Care | Payer: 59 | Source: Ambulatory Visit | Attending: Urology | Admitting: Urology

## 2023-06-23 DIAGNOSIS — R1031 Right lower quadrant pain: Secondary | ICD-10-CM | POA: Insufficient documentation

## 2023-06-23 MED ORDER — IOHEXOL 300 MG/ML  SOLN
100.0000 mL | Freq: Once | INTRAMUSCULAR | Status: AC | PRN
  Administered 2023-06-23: 100 mL via INTRAVENOUS

## 2023-06-24 ENCOUNTER — Ambulatory Visit (INDEPENDENT_AMBULATORY_CARE_PROVIDER_SITE_OTHER): Payer: 59 | Admitting: Medical

## 2023-06-24 VITALS — BP 110/70 | HR 62 | Wt 184.0 lb

## 2023-06-24 DIAGNOSIS — I10 Essential (primary) hypertension: Secondary | ICD-10-CM | POA: Diagnosis not present

## 2023-06-24 DIAGNOSIS — Z7185 Encounter for immunization safety counseling: Secondary | ICD-10-CM

## 2023-06-24 DIAGNOSIS — Z79899 Other long term (current) drug therapy: Secondary | ICD-10-CM

## 2023-06-24 DIAGNOSIS — Z23 Encounter for immunization: Secondary | ICD-10-CM

## 2023-06-24 DIAGNOSIS — I2510 Atherosclerotic heart disease of native coronary artery without angina pectoris: Secondary | ICD-10-CM

## 2023-06-24 DIAGNOSIS — K219 Gastro-esophageal reflux disease without esophagitis: Secondary | ICD-10-CM

## 2023-06-24 DIAGNOSIS — I700 Atherosclerosis of aorta: Secondary | ICD-10-CM

## 2023-06-24 DIAGNOSIS — E559 Vitamin D deficiency, unspecified: Secondary | ICD-10-CM

## 2023-06-24 DIAGNOSIS — E1169 Type 2 diabetes mellitus with other specified complication: Secondary | ICD-10-CM | POA: Diagnosis not present

## 2023-06-24 DIAGNOSIS — M5442 Lumbago with sciatica, left side: Secondary | ICD-10-CM

## 2023-06-24 DIAGNOSIS — E1142 Type 2 diabetes mellitus with diabetic polyneuropathy: Secondary | ICD-10-CM | POA: Diagnosis not present

## 2023-06-24 DIAGNOSIS — I4891 Unspecified atrial fibrillation: Secondary | ICD-10-CM

## 2023-06-24 DIAGNOSIS — E118 Type 2 diabetes mellitus with unspecified complications: Secondary | ICD-10-CM

## 2023-06-24 DIAGNOSIS — D638 Anemia in other chronic diseases classified elsewhere: Secondary | ICD-10-CM

## 2023-06-24 DIAGNOSIS — G8929 Other chronic pain: Secondary | ICD-10-CM

## 2023-06-24 DIAGNOSIS — I6523 Occlusion and stenosis of bilateral carotid arteries: Secondary | ICD-10-CM

## 2023-06-24 LAB — POCT I-STAT CREATININE: Creatinine, Ser: 0.7 mg/dL (ref 0.44–1.00)

## 2023-06-24 MED ORDER — FREESTYLE LIBRE 3 SENSOR MISC: 5 refills | Status: DC

## 2023-06-24 NOTE — Progress Notes (Signed)
Subjective: Chief Complaint  Patient presents with   diabetes    Diabetes, having issues with Freestyle libre    Here for med check. Accompanied by her daughter Babette Relic as usual.  Had been having some abdominal pain, saw urology recently, had CT scan yesterday, abdomen and pelvis.     Diabetes - using 20-30u Toujeo daily, metformin 500mg  BID, triulicity weekly injection.  Lately has had some low readings per freestyle reader.    Hyperlipidemia-compliant with atorvastatin 40 mg daily  Hypertension-compliant with diltiazem 30 mg twice daily, enalapril 20 mg daily, 1/2 tablet hydrochlorothiazide 25 mg daily  A-fib-recently saw cardiology, compliant with diltiazem 30 mg twice daily, Eliquis 5 mg twice daily  She continues with Laurey Morale daily, history of low iron  Chronic constipation-uses Linzess daily which works for her  She continues on vitamin D supplement  GERD long-term-uses Dexilant 60 mg daily, using tums regularly as well  Chronic pain, arthritis-seeing pain clinic through neurosurgery office, she continues on Cymbalta 60 mg daily, uses Celebrex daily  ROS as in subjective   Past Medical History:  Diagnosis Date   Adrenal mass, right (HCC)    Allergy    Anemia    Asthma    "as a teenager"   Blind right eye    since childhood   Blood transfusion 1970   Chronic back pain    Chronic diarrhea    Colestipol therapy   Depression    Diverticulitis    Diverticulosis    Fatty liver    Female bladder prolapse    GERD (gastroesophageal reflux disease)    Glaucoma    H/O bone density study 11/29/2014   normal study although mild decreased in density from prior study   H/O hiatal hernia    Hemorrhoids    History of kidney stones    History of uterine cancer    s/p hysterectomy   Hyperlipidemia    Hypertension    Migraine    hx of migraines in past    Neuromuscular disorder (HCC)    diabetic neuropahthy   Numbness and tingling of both legs    Osteoarthritis     Rheumatic fever    age 39   Type II diabetes mellitus (HCC)    Urinary incontinence    Vitamin D deficiency    Current Outpatient Medications on File Prior to Visit  Medication Sig Dispense Refill   atorvastatin (LIPITOR) 40 MG tablet TAKE 1 TABLET(40 MG) BY MOUTH AT BEDTIME 90 tablet 1   brimonidine (ALPHAGAN) 0.2 % ophthalmic solution Place 1 drop into the left eye 2 times daily.     celecoxib (CELEBREX) 200 MG capsule Take 1 capsule (200 mg total) by mouth daily. 90 capsule 0   Cholecalciferol (VITAMIN D) 50 MCG (2000 UT) CAPS Take 1 capsule (2,000 Units total) by mouth daily. 90 capsule 3   dexlansoprazole (DEXILANT) 60 MG capsule Take 1 capsule (60 mg total) by mouth 2 (two) times daily. 180 capsule 1   diltiazem (CARDIZEM) 30 MG tablet TAKE 1 TABLET BY MOUTH TWICE DAILY 180 tablet 3   dorzolamide-timolol (COSOPT) 22.3-6.8 MG/ML ophthalmic solution Place 1 drop into both eyes 2 (two) times daily.     DULoxetine (CYMBALTA) 60 MG capsule TAKE 1 CAPSULE(60 MG) BY MOUTH DAILY 90 capsule 1   ELIQUIS 5 MG TABS tablet TAKE 1 TABLET BY MOUTH TWICE DAILY 60 tablet 4   enalapril (VASOTEC) 20 MG tablet TAKE 1 TABLET BY MOUTH DAILY 90 tablet 0  ferrous gluconate (FERGON) 324 MG tablet TAKE 1 TABLET BY MOUTH DAILY WITH BREAKFAST 90 tablet 0   hydrochlorothiazide (HYDRODIURIL) 25 MG tablet Take 0.5 tablets (12.5 mg total) by mouth daily. 90 tablet 2   insulin glargine, 1 Unit Dial, (TOUJEO SOLOSTAR) 300 UNIT/ML Solostar Pen Inject 50 Units into the skin daily. 15 mL 3   latanoprost (XALATAN) 0.005 % ophthalmic solution Place 1 drop into the right eye at bedtime.     linaclotide (LINZESS) 72 MCG capsule TAKE ONE CAPSULE BY MOUTH EVERY DAY BEFORE breakfast 90 capsule 0   metFORMIN (GLUCOPHAGE) 500 MG tablet TAKE 1 TABLET(500 MG) BY MOUTH TWICE DAILY WITH A MEAL 180 tablet 1   Probiotic Product (PROBIOTIC BLEND PO) Take by mouth.     triamcinolone cream (KENALOG) 0.1 % Apply 1 Application topically  daily as needed. PRN     TRULICITY 3 MG/0.5ML SOPN INJECT 3 MG (1 pen) INTO THE SKIN ONCE a WEEK 2 mL 3   Continuous Glucose Sensor (FREESTYLE LIBRE 3 SENSOR) MISC place 1 sensor EVERY 14 DAYS 2 each 5   Insulin Pen Needle (GLOBAL EASE INJECT PEN NEEDLES) 31G X 5 MM MISC USE AS DIRECTED TO INJECT TOUJEO DAILY 100 each 0   No current facility-administered medications on file prior to visit.      Objective: BP 110/70   Pulse 62   Wt 184 lb (83.5 kg)   BMI 31.10 kg/m   Gen: wd, wn, nad Lungs clear Heart regular rate rhythm, normal S1-S2 2+ upper extremity pulses, 1+ lower extremity pulses     Assessment: Encounter Diagnoses  Name Primary?   Diabetes mellitus with complication (HCC) Yes   Diabetic polyneuropathy associated with type 2 diabetes mellitus (HCC)    Dyslipidemia associated with type 2 diabetes mellitus (HCC)    Essential hypertension, benign    Gastroesophageal reflux disease, unspecified whether esophagitis present    Vitamin D deficiency    Vaccine counseling    Anemia of chronic disease    Aortic atherosclerosis (HCC)    Atrial fibrillation, unspecified type (HCC)    Bilateral carotid artery stenosis    Chronic bilateral low back pain with left-sided sciatica    Coronary artery disease due to lipid rich plaque    Need for influenza vaccination    Polypharmacy     Plan: Polypharmacy-we discussed her medications.  There is not a whole lot we could come off but she has a desire to reduce pill burden.  We discussed possibly doing pill packs.  We discussed possibly giving up on iron and vitamin D for the time being and seeing how she does off of these medications.   Diabetes Unfortunately has had some recent low readings.  Pending labs we will consider modifying her regimen. For now she continues on Trulicity weekly injection 3 mg, continue metformin 500 mg twice daily, continue Toujeo 20-30 units daily Continue glucose monitoring regularly Continue daily  foot checks Continue yearly eye doctor visit and diabetic eye exam   Chronic pain, neuropathy-sees pain management and neurosurgery office.  She continues on Cymbalta and Celebrex.  She was on narcotic pain medicine prior in the past.  Caution regarding bleeding.  Afib I reviewed over her cardiology notes from May 2024.  At that time she was referred to vascular for surveillance of right carotid stenosis 60 to 79%.  She also is having some A-fib on her watch.  She was continued on Cardizem 30 mg twice daily.  CHADS2 score of  5.  On Eliquis.    Anemia of chronic disease - continues to take iron daily, but consider stopping this for the time being to reduce pill burden   Dyslipidemia-continue Lipitor 40 mg daily.  I reviewed her recent lipid profile from May 2024   Vitamin D deficiency-consider stopping vitamin D to reduce pill burden   GERD-not adequately controlled despite Dexilant intolerance.  Consider famotidine, consider GI consult.  She will let me know  Vaccine counseling: Advised Shingrix at her pharmacy Yearly flu shot advised  Counseled on the influenza virus vaccine.  Vaccine information sheet given.  Influenza vaccine given after consent obtained.    Kristal was seen today for diabetes.  Diagnoses and all orders for this visit:  Diabetes mellitus with complication (HCC) -     Renal Function Panel -     Hemoglobin A1c  Diabetic polyneuropathy associated with type 2 diabetes mellitus (HCC)  Dyslipidemia associated with type 2 diabetes mellitus (HCC) -     Hepatic function panel  Essential hypertension, benign -     Renal Function Panel -     Hepatic function panel  Gastroesophageal reflux disease, unspecified whether esophagitis present  Vitamin D deficiency -     VITAMIN D 25 Hydroxy (Vit-D Deficiency, Fractures)  Vaccine counseling  Anemia of chronic disease  Aortic atherosclerosis (HCC)  Atrial fibrillation, unspecified type (HCC)  Bilateral  carotid artery stenosis  Chronic bilateral low back pain with left-sided sciatica  Coronary artery disease due to lipid rich plaque  Need for influenza vaccination -     Flu Vaccine Trivalent High Dose (Fluad)  Polypharmacy   F/u 69mo

## 2023-06-25 LAB — HEPATIC FUNCTION PANEL
ALT: 27 IU/L (ref 0–32)
AST: 28 IU/L (ref 0–40)
Alkaline Phosphatase: 120 IU/L (ref 44–121)
Bilirubin Total: 0.5 mg/dL (ref 0.0–1.2)
Bilirubin, Direct: 0.18 mg/dL (ref 0.00–0.40)
Total Protein: 7.2 g/dL (ref 6.0–8.5)

## 2023-06-25 LAB — RENAL FUNCTION PANEL
Albumin: 4.4 g/dL (ref 3.8–4.8)
BUN/Creatinine Ratio: 14 (ref 12–28)
BUN: 12 mg/dL (ref 8–27)
CO2: 26 mmol/L (ref 20–29)
Calcium: 10.3 mg/dL (ref 8.7–10.3)
Chloride: 102 mmol/L (ref 96–106)
Creatinine, Ser: 0.83 mg/dL (ref 0.57–1.00)
Glucose: 112 mg/dL — ABNORMAL HIGH (ref 70–99)
Phosphorus: 4.4 mg/dL — ABNORMAL HIGH (ref 3.0–4.3)
Potassium: 5.6 mmol/L — ABNORMAL HIGH (ref 3.5–5.2)
Sodium: 141 mmol/L (ref 134–144)
eGFR: 73 mL/min/{1.73_m2} (ref >59)

## 2023-06-25 LAB — VITAMIN D 25 HYDROXY (VIT D DEFICIENCY, FRACTURES): Vit D, 25-Hydroxy: 40.2 ng/mL (ref 30.0–100.0)

## 2023-06-25 LAB — HEMOGLOBIN A1C
Est. average glucose Bld gHb Est-mCnc: 157 mg/dL
Hgb A1c MFr Bld: 7.1 % — ABNORMAL HIGH (ref 4.8–5.6)

## 2023-06-25 NOTE — Progress Notes (Signed)
Robyn Hill-reviewed lab results but also see if she needs any refills currently    Labs show potassium and phosphorus a little abnormal.  Diabetes marker stable at 7.1%.  Liver test okay.  Vitamin D okay.  As discussed yesterday if you want to hold off on vitamin D and iron for couple months that is fine.  Or you could take them once a week for the time being.  Continue half tablet of your hydrochlorothiazide fluid pill daily, but I recommend you take the Enalapril blood pressure pill 20 mg and cut this in half as well since the potassium was elevated.  Regarding diabetes, continue Trulicity weekly.  Continue metformin.  If you want to see if insurance will cover metformin once a day I might change her dose to a once a day dose if that would help.  There is an XR version. I recommend you change your Toujeo to 15 units daily for now since you have been seeing some low readings as opposed to 20 to 30 units daily  We really should recheck in a few weeks regarding the potassium so lets plan a 3 to 4-week follow-up

## 2023-06-28 ENCOUNTER — Telehealth: Payer: Self-pay | Admitting: Internal Medicine

## 2023-06-28 NOTE — Telephone Encounter (Signed)
Mailing rx for blood work to patient to take script to [facility] to get labs done in 3-4 weeks. Pt is aware to look out for script

## 2023-07-08 ENCOUNTER — Telehealth: Payer: Self-pay | Admitting: Cardiology

## 2023-07-08 NOTE — Telephone Encounter (Signed)
  Patient is requesting to switch from Dr Antoine Poche to Dr Jenene Slicker due to location since she lives in [place]. Please advise.

## 2023-07-10 ENCOUNTER — Other Ambulatory Visit: Payer: Self-pay | Admitting: Medical

## 2023-07-23 ENCOUNTER — Other Ambulatory Visit (HOSPITAL_COMMUNITY)
Admission: RE | Admit: 2023-07-23 | Discharge: 2023-07-23 | Disposition: A | Discharge status: Home / Self Care | Payer: 59 | Source: Ambulatory Visit | Attending: Family Medicine | Admitting: Family Medicine

## 2023-07-23 DIAGNOSIS — E875 Hyperkalemia: Secondary | ICD-10-CM | POA: Diagnosis present

## 2023-07-23 LAB — BASIC METABOLIC PANEL
Anion gap: 7 (ref 5–15)
BUN: 19 mg/dL (ref 8–23)
CO2: 33 mmol/L — ABNORMAL HIGH (ref 22–32)
Calcium: 9.8 mg/dL (ref 8.9–10.3)
Chloride: 98 mmol/L (ref 98–111)
Creatinine, Ser: 0.74 mg/dL (ref 0.44–1.00)
GFR, Estimated: >60 mL/min (ref >60)
Glucose, Bld: 99 mg/dL (ref 70–99)
Potassium: 4.1 mmol/L (ref 3.5–5.1)
Sodium: 138 mmol/L (ref 135–145)

## 2023-07-25 ENCOUNTER — Other Ambulatory Visit: Payer: Self-pay | Admitting: Medical

## 2023-07-31 ENCOUNTER — Other Ambulatory Visit: Payer: Self-pay | Admitting: Medical

## 2023-08-03 ENCOUNTER — Other Ambulatory Visit: Payer: Self-pay | Admitting: Medical

## 2023-08-17 ENCOUNTER — Telehealth: Payer: Self-pay | Admitting: Nurse Practitioner

## 2023-08-17 ENCOUNTER — Ambulatory Visit: Payer: 59 | Attending: Nurse Practitioner | Admitting: Nurse Practitioner

## 2023-08-17 ENCOUNTER — Encounter: Payer: Self-pay | Admitting: Nurse Practitioner

## 2023-08-17 VITALS — BP 106/60 | HR 88 | Ht 64.0 in | Wt 186.4 lb

## 2023-08-17 DIAGNOSIS — I38 Endocarditis, valve unspecified: Secondary | ICD-10-CM | POA: Diagnosis not present

## 2023-08-17 DIAGNOSIS — I10 Essential (primary) hypertension: Secondary | ICD-10-CM | POA: Diagnosis not present

## 2023-08-17 DIAGNOSIS — I6523 Occlusion and stenosis of bilateral carotid arteries: Secondary | ICD-10-CM

## 2023-08-17 DIAGNOSIS — R5382 Chronic fatigue, unspecified: Secondary | ICD-10-CM

## 2023-08-17 DIAGNOSIS — I480 Paroxysmal atrial fibrillation: Secondary | ICD-10-CM | POA: Diagnosis not present

## 2023-08-17 DIAGNOSIS — I272 Pulmonary hypertension, unspecified: Secondary | ICD-10-CM

## 2023-08-17 DIAGNOSIS — R0609 Other forms of dyspnea: Secondary | ICD-10-CM

## 2023-08-17 NOTE — Progress Notes (Addendum)
Cardiology Office Note:  .   Date:  08/17/2023 ID:  Robyn Hill, DOB Nov 26, 1945, MRN [id number] PCP: Genia Del  [facility] Providers Cardiologist:  Marjo Bicker, MD    History of Present Illness: .   Robyn Hill is a 77 y.o. female with a PMH of A-fib, carotid artery stenosis, cardiac murmur, HTN, GERD, type 2 diabetes, arthritis, chronic back pain, history of thyroid disease, anemia of chronic disease, fatigue, who presents today for A-fib evaluation.  Last seen by Dr. Antoine Poche on January 13, 2022 for evaluation for possible A-fib.  It was stated this is possibly seen on EKG at PCPs office, however Dr. Antoine Poche felt that this was PAT.  Patient wore a monitor that did not reveal A-fib.  Her Apple Watch was reviewed and was felt that the rhythm was A-fib.  Patient noted fatigue at the time, heart rate ranging 50-140s.  She was started on Eliquis due to CHA2DS2-VASc score of 4.  She was also started on low-dose metoprolol 12.5 mg twice daily.  Echocardiogram was arranged -see report below.  Today she presents for A-fib evaluation with her daughter.  She presents her readings from her Apple Watch that show A-fib readings with the most recent readings from 08/09/2023 that show overall well controlled HR in A-fib. Patient says she cannot tell when she is in A-fib, she is asymptomatic, just notices this on her Apple Watch. Patient endorses some fatigue. Denies any chest pain, shortness of breath, palpitations, syncope, presyncope, dizziness, orthopnea, PND, swelling or significant weight changes, acute bleeding, or claudication. Requesting to see a new cardiologist. Patient's daughter states she previously wore a cardiac monitor, could not tolerate due to allergy, had to take off early. Daughter also presents concern regarding patient's previous carotid doppler report.   ROS: Negative. See HPI.   Studies Reviewed: Marland Kitchen   EKG Interpretation Date/Time:  Tuesday August 17 2023 16:06:34 EDT Ventricular Rate:  78 PR Interval:  98 QRS Duration:  96 QT Interval:  410 QTC Calculation: 467 R Axis:   -14  Text Interpretation: Unusual P axis and short PR, probable junctional tachycardia with Premature atrial complexes When compared with ECG of 03-Sep-2015 10:16, Junctional rhythm has replaced Sinus rhythm Confirmed by Sharlene Dory [phone]) on 08/17/2023 4:10:09 PM    Carotid duplex 09/2022:  Summary:  Right Carotid: Velocities in the right ICA are consistent with a 60-79% stenosis. Non-hemodynamically significant plaque <50% noted in the CCA. The ECA appears >50% stenosed.   Left Carotid: Velocities in the left ICA are consistent with a 1-39%  stenosis. Non-hemodynamically significant plaque <50% noted in the  CCA.   Vertebrals: Bilateral vertebral arteries demonstrate antegrade flow.  Subclavians: Bilateral subclavian artery flow was disturbed.   *See table(s) above for measurements and observations.  Suggest follow up study in 12 months.  Echo 01/2022:  IMPRESSIONS    1. Left ventricular ejection fraction, by estimation, is 65 to 70%. The  left ventricle has normal function. The left ventricle has no regional  wall motion abnormalities. There is mild left ventricular hypertrophy.  Left ventricular diastolic parameters  were normal.   2. Right ventricular systolic function is low normal. The right  ventricular size is mildly enlarged. There is moderately elevated  pulmonary artery systolic pressure. The estimated right ventricular  systolic pressure is 46.6 mmHg.   3. Left atrial size was mildly dilated.   4. The mitral valve is grossly normal. Mild mitral valve regurgitation.  5. Tricuspid valve regurgitation is moderate.   6. The aortic valve is tricuspid. There is mild calcification of the  aortic valve. Aortic valve regurgitation is not visualized. Aortic valve  sclerosis/calcification is present, without any evidence of aortic  stenosis.  Aortic valve mean gradient  measures 3.0 mmHg.   7. The inferior vena cava is normal in size with greater than 50%  respiratory variability, suggesting right atrial pressure of 3 mmHg.   Comparison(s): No prior Echocardiogram.   Monitor 12/2021:  Normal sinus rhythm is the predominant rhythm Rare ectopy. Blocked PACs First degree AV block No sustained arrhythmias  Lexiscan 05/2016:  Nuclear stress EF: 67%. The left ventricular ejection fraction is normal (55-65%). There was no ST segment deviation noted during stress. No T wave inversion was noted during stress. Defect 1: There is a small defect of mild severity present in the mid anterior location. The study is normal. This is a low risk study.   Low risk stress nuclear study with normal perfusion and normal left ventricular regional and global systolic function. Mild breast attenuation artifact is seen.   Risk Assessment/Calculations:    CHA2DS2-VASc Score = 5  } This indicates a 7.2% annual risk of stroke. The patient's score is based upon: CHF History: 0 HTN History: 1 Diabetes History: 1 Stroke History: 0 Vascular Disease History: 0 Age Score: 2 Gender Score: 1      The 10-year ASCVD risk score (Arnett DK, et al., 2019) is: 32.5%   Values used to calculate the score:     Age: 38 years     Sex: Female     Is Non-Hispanic African American: No     Diabetic: Yes     Tobacco smoker: No     Systolic Blood Pressure: 106 mmHg     Is BP treated: Yes     HDL Cholesterol: 59 mg/dL     Total Cholesterol: 134 mg/dL  Physical Exam:   VS:  BP 106/60   Pulse 88   Ht 5\' 4"  (1.626 m)   Wt 186 lb 6.4 oz (84.6 kg)   SpO2 92%   BMI 32.00 kg/m    Wt Readings from Last 3 Encounters:  08/17/23 186 lb 6.4 oz (84.6 kg)  06/24/23 184 lb (83.5 kg)  03/01/23 194 lb 3.2 oz (88.1 kg)    GEN: Well nourished, well developed in no acute distress NECK: No JVD; No carotid bruits CARDIAC: S1/S2, RRR, no murmurs, rubs,  gallops RESPIRATORY:  Clear to auscultation without rales, wheezing or rhonchi  EXTREMITIES:  No edema; No deformity   ASSESSMENT AND PLAN: .    PAF  Denies any recent tachycardia or palpitations. Most recent Apple Watch HR readings revealed well controlled HR in A-fib. Pt is overall asymptomatic with her A-fib, does appear she has chronic fatigue with etiology unclear/multifactorial. She is not in A-fib today. Offered to arrange hypoallergenic monitor (Preventice) for 2 weeks for further evaluation of her A-fib and she is agreeable to proceed. Will arrange.  Continue Eliquis 5 mg twice daily due to CHA2DS2-VASc score of 5.  She is on appropriate dosage, denies any bleeding issues.  Heart healthy diet and regular cardiovascular exercise encouraged.   Carotid artery stenosis Most recent doppler from 09/2022 revealed RICA stenosis at 60-79% with LICA stenosis at 1-39%, previous year revealed RICA stenosis at 40-59% and LICA at 1-39%. Will arrange repeat carotid doppler after she is done wearing the monitor. Not on aspirin d/t being on Eliquis. Continue  atorvastatin. Heart healthy diet and regular cardiovascular exercise encouraged.   HTN Blood pressure well-controlled. Discussed to monitor BP at home at least 2 hours after medications and sitting for 5-10 minutes.  No medication changes at this time. Heart healthy diet and regular cardiovascular exercise encouraged.   Valvular insufficiency Echocardiogram in April 2023 revealed moderate tricuspid regurgitation, mild MR.  Patient and patient's daughter are requesting to update echocardiogram at this time.  Will arrange and repeat Echo.   4. Pulmonary hypertension Echocardiogram in April 2023 revealed moderately elevated PASP with estimated right ventricular systolic pressure 46.6 mmHg.  Etiology unclear. Patient is overall asymptomatic, notes some chronic fatigue-see below.  Updating echocardiogram at this time.  Continue current medication regimen.   Continue follow-up with PCP.  Care and ED precautions discussed.  Fatigue Appears that this is chronic.  Etiology multifactorial. Discussed to monitor BP at home at least 2 hours after medications and sitting for 5-10 minutes.  Most recent labs were reviewed and were unremarkable.  Updating Echo. Continue follow-up with PCP.    Dispo: Follow-up with me/APP in 6-8 weeks or sooner if anything changes.   Signed, Sharlene Dory, NP

## 2023-08-17 NOTE — Patient Instructions (Addendum)
Medication Instructions:  Your physician recommends that you continue on your current medications as directed. Please refer to the Current Medication list given to you today.  Labwork: None   Testing/Procedures: Your physician has requested that you have an echocardiogram. Echocardiography is a painless test that uses sound waves to create images of your heart. It provides your doctor with information about the size and shape of your heart and how well your heart's chambers and valves are working. This procedure takes approximately one hour. There are no restrictions for this procedure. Please do NOT wear cologne, perfume, aftershave, or lotions (deodorant is allowed). Please arrive 15 minutes prior to your appointment time.  Your physician has requested that you have a carotid duplex. This test is an ultrasound of the carotid arteries in your neck. It looks at blood flow through these arteries that supply the brain with blood. Allow one hour for this exam. There are no restrictions or special instructions.    Follow-Up: Your physician recommends that you schedule a follow-up appointment in: 6-8 weeks   Any Other Special Instructions Will Be Listed Below (If Applicable).  If you need a refill on your cardiac medications before your next appointment, please call your pharmacy.

## 2023-08-17 NOTE — Telephone Encounter (Signed)
Checking percert on the following    14 day preventice monitor- A-Fib Echo & Carotid doppler after 2 week monitor but before next OV .

## 2023-08-19 LAB — HM MAMMOGRAPHY

## 2023-08-24 ENCOUNTER — Telehealth: Payer: Self-pay | Admitting: Medical

## 2023-08-24 NOTE — Telephone Encounter (Signed)
Pt was notified.  

## 2023-08-24 NOTE — Telephone Encounter (Signed)
I am happy to report that her mammogram was normal, no worrisome findings.

## 2023-08-30 ENCOUNTER — Other Ambulatory Visit: Payer: Self-pay | Admitting: Medical

## 2023-08-30 DIAGNOSIS — E118 Type 2 diabetes mellitus with unspecified complications: Secondary | ICD-10-CM

## 2023-09-01 ENCOUNTER — Other Ambulatory Visit: Payer: Self-pay | Admitting: Medical

## 2023-09-03 ENCOUNTER — Other Ambulatory Visit: Payer: Self-pay | Admitting: Medical

## 2023-09-03 NOTE — Telephone Encounter (Signed)
I did not see this in her current med list last apt 06/24/23

## 2023-09-08 ENCOUNTER — Ambulatory Visit: Payer: 59

## 2023-09-08 ENCOUNTER — Ambulatory Visit: Payer: 59 | Attending: Nurse Practitioner

## 2023-09-08 DIAGNOSIS — I6523 Occlusion and stenosis of bilateral carotid arteries: Secondary | ICD-10-CM | POA: Diagnosis not present

## 2023-09-08 DIAGNOSIS — R5382 Chronic fatigue, unspecified: Secondary | ICD-10-CM

## 2023-09-08 DIAGNOSIS — I272 Pulmonary hypertension, unspecified: Secondary | ICD-10-CM | POA: Diagnosis not present

## 2023-09-08 DIAGNOSIS — I38 Endocarditis, valve unspecified: Secondary | ICD-10-CM | POA: Diagnosis not present

## 2023-09-09 LAB — ECHOCARDIOGRAM COMPLETE
AR max vel: 2.35 cm2
AV Area VTI: 2.31 cm2
AV Area mean vel: 2.53 cm2
AV Mean grad: 3.5 mm[Hg]
AV Peak grad: 7.3 mm[Hg]
Ao pk vel: 1.35 m/s
Area-P 1/2: 4.77 cm2
Calc EF: 57.8 %
MV VTI: 1.8 cm2
S' Lateral: 2.8 cm
Single Plane A2C EF: 64.1 %
Single Plane A4C EF: 48.9 %

## 2023-09-17 ENCOUNTER — Other Ambulatory Visit: Payer: Self-pay | Admitting: Medical

## 2023-09-23 ENCOUNTER — Ambulatory Visit (INDEPENDENT_AMBULATORY_CARE_PROVIDER_SITE_OTHER): Payer: 59 | Admitting: Medical

## 2023-09-23 VITALS — BP 118/70 | HR 63 | Wt 185.6 lb

## 2023-09-23 DIAGNOSIS — E1142 Type 2 diabetes mellitus with diabetic polyneuropathy: Secondary | ICD-10-CM

## 2023-09-23 DIAGNOSIS — E785 Hyperlipidemia, unspecified: Secondary | ICD-10-CM

## 2023-09-23 DIAGNOSIS — I10 Essential (primary) hypertension: Secondary | ICD-10-CM

## 2023-09-23 DIAGNOSIS — E118 Type 2 diabetes mellitus with unspecified complications: Secondary | ICD-10-CM | POA: Diagnosis not present

## 2023-09-23 DIAGNOSIS — L299 Pruritus, unspecified: Secondary | ICD-10-CM | POA: Diagnosis not present

## 2023-09-23 DIAGNOSIS — I6523 Occlusion and stenosis of bilateral carotid arteries: Secondary | ICD-10-CM

## 2023-09-23 DIAGNOSIS — E1169 Type 2 diabetes mellitus with other specified complication: Secondary | ICD-10-CM

## 2023-09-23 DIAGNOSIS — R5383 Other fatigue: Secondary | ICD-10-CM

## 2023-09-23 DIAGNOSIS — I2583 Coronary atherosclerosis due to lipid rich plaque: Secondary | ICD-10-CM

## 2023-09-23 DIAGNOSIS — K649 Unspecified hemorrhoids: Secondary | ICD-10-CM

## 2023-09-23 DIAGNOSIS — I2510 Atherosclerotic heart disease of native coronary artery without angina pectoris: Secondary | ICD-10-CM

## 2023-09-23 DIAGNOSIS — R4589 Other symptoms and signs involving emotional state: Secondary | ICD-10-CM

## 2023-09-23 MED ORDER — ATORVASTATIN CALCIUM 40 MG PO TABS: ORAL_TABLET | ORAL | 1 refills | Status: DC

## 2023-09-23 MED ORDER — HYDROCORTISONE ACETATE 25 MG RE SUPP
25.0000 mg | Freq: Two times a day (BID) | RECTAL | 1 refills | Status: DC
Start: 2023-09-23 — End: 2024-05-15

## 2023-09-23 MED ORDER — FLUOCINOLONE ACETONIDE 0.01 % EX CREA
TOPICAL_CREAM | Freq: Every evening | CUTANEOUS | 0 refills | Status: DC | PRN
Start: 2023-09-23 — End: 2024-05-03

## 2023-09-23 MED ORDER — HYDROCHLOROTHIAZIDE 25 MG PO TABS: 25.0000 mg | ORAL_TABLET | Freq: Every day | ORAL | 1 refills | Status: DC

## 2023-09-23 MED ORDER — APIXABAN 5 MG PO TABS: 5.0000 mg | ORAL_TABLET | Freq: Two times a day (BID) | ORAL | 1 refills | Status: DC

## 2023-09-23 MED ORDER — DULOXETINE HCL 60 MG PO CPEP: 60.0000 mg | ORAL_CAPSULE | Freq: Two times a day (BID) | ORAL | 1 refills | Status: DC

## 2023-09-23 NOTE — Progress Notes (Signed)
Subjective: Chief Complaint  Patient presents with   Diabetes    Diabetes - no concerns, ears are itchy and dry. Wants to take hydrochlorothiazide whole tablet instead of 1/2 tablet.   Here for med check. Accompanied by one of her son's today.  Diabetes - using 50u Toujeo daily, metformin 500mg  BID, triulicity weekly injection.  reviewing her freestyle libre information, she had a 7-day average of 148 glucose, 14-day average 152 glucose, 30-day average of 153 glucose.  She has 4 hypoglycemic events in the last 9 days but none in the last 30 days.  Hyperlipidemia-compliant with atorvastatin 40 mg daily  She has concerns of itchy skin of both the ears.  She uses moisturizing lotion and that helps sometimes.  No rash.  Lately she has been more fatigued and thinks it is related to depression.  She has been feeling more down.  With her health issues including poor eyesight, joint pains, inability to drive, she has just felt down and tired all the time.  She just saw cardiology recently had echocardiogram and ultrasound of carotids.  She has follow-up next week to discuss the carotid results which are abnormal  She complains of her ongoing problems with hemorrhoids.  She does get bleeding from time to time.  She uses the external cream which helps but most of there is other things that can improve on this.  Past Medical History:  Diagnosis Date   Adrenal mass, right (HCC)    Allergy    Anemia    Asthma    "as a teenager"   Blind right eye    since childhood   Blood transfusion 1970   Chronic back pain    Chronic diarrhea    Colestipol therapy   Depression    Diverticulitis    Diverticulosis    Fatty liver    Female bladder prolapse    GERD (gastroesophageal reflux disease)    Glaucoma    H/O bone density study 11/29/2014   normal study although mild decreased in density from prior study   H/O hiatal hernia    Hemorrhoids    History of kidney stones    History of uterine cancer     s/p hysterectomy   Hyperlipidemia    Hypertension    Migraine    hx of migraines in past    Neuromuscular disorder (HCC)    diabetic neuropahthy   Numbness and tingling of both legs    Osteoarthritis    Rheumatic fever    age 21   Type II diabetes mellitus (HCC)    Urinary incontinence    Vitamin D deficiency    Current Outpatient Medications on File Prior to Visit  Medication Sig Dispense Refill   brimonidine (ALPHAGAN) 0.2 % ophthalmic solution Place 1 drop into the left eye 2 times daily.     celecoxib (CELEBREX) 200 MG capsule TAKE ONE CAPSULE BY MOUTH DAILY 90 capsule 0   Cholecalciferol (VITAMIN D) 50 MCG (2000 UT) CAPS Take 1 capsule (2,000 Units total) by mouth daily. 90 capsule 3   Continuous Glucose Sensor (FREESTYLE LIBRE 3 SENSOR) MISC place 1 sensor EVERY 14 DAYS 2 each 5   dexlansoprazole (DEXILANT) 60 MG capsule TAKE ONE CAPSULE BY MOUTH TWICE DAILY 180 capsule 1   diltiazem (CARDIZEM) 30 MG tablet TAKE 1 TABLET BY MOUTH TWICE DAILY 180 tablet 3   dorzolamide-timolol (COSOPT) 22.3-6.8 MG/ML ophthalmic solution Place 1 drop into both eyes 2 (two) times daily.     enalapril (  VASOTEC) 20 MG tablet TAKE 1 TABLET BY MOUTH DAILY 90 tablet 1   latanoprost (XALATAN) 0.005 % ophthalmic solution Place 1 drop into the right eye at bedtime.     LINZESS 72 MCG capsule TAKE ONE CAPSULE BY MOUTH EVERY DAY BEFORE breakfast 90 capsule 2   metFORMIN (GLUCOPHAGE) 500 MG tablet TAKE 1 TABLET BY MOUTH TWICE DAILY WITH MEALS 180 tablet 1   morphine (MSIR) 15 MG tablet Take 15 mg by mouth every 6 (six) hours as needed.     Probiotic Product (PROBIOTIC BLEND PO) Take by mouth.     PROCTO-MED HC 2.5 % rectal cream APPLY RECTALLY TO THE AFFECTED AREA(S) TWICE DAILY 28 g 0   TOUJEO SOLOSTAR 300 UNIT/ML Solostar Pen INJECT 50 UNITS INTO THE SKIN DAILY 15 mL 0   triamcinolone cream (KENALOG) 0.1 % Apply 1 Application topically daily as needed. PRN     TRULICITY 3 MG/0.5ML SOAJ INJECT 3 MG (1  pen) UNDER THE SKIN ONCE a WEEK 2 mL 1   EASY TOUCH PEN NEEDLES 31G X 5 MM MISC USE AS DIRECTED TO INJECT TOUJEO DAILY 100 each 0   No current facility-administered medications on file prior to visit.    ROS as in subjective       Objective: BP 118/70   Pulse 63   Wt 185 lb 9.6 oz (84.2 kg)   SpO2 95%   BMI 31.86 kg/m   Gen: wd, wn, nad Lungs clear Heart regular rate rhythm, normal S1-S2 2+ upper extremity pulses, 1+ lower extremity pulses Bruit heard in right carotid region, neck supple, nontender, no mass No obvious skin flaking or rash of either ear, ears otherwise normal Psych: Pleasant, answers questions appropriately  Diabetic Foot Exam - Simple   Simple Foot Form Diabetic Foot exam was performed with the following findings: Yes 09/23/2023  1:39 PM  Visual Inspection See comments: Yes Sensation Testing See comments: Yes Pulse Check See comments: Yes Comments 1+ pedal pulses, flat feet bilaterally, decreased monofilament sensation throughout both feet      Assessment: Encounter Diagnoses  Name Primary?   Depressed mood Yes   Itching of ear    Diabetes mellitus with complication (HCC)    Dyslipidemia associated with type 2 diabetes mellitus (HCC)    Diabetic polyneuropathy associated with type 2 diabetes mellitus (HCC)    Coronary artery disease due to lipid rich plaque    Essential hypertension, benign    Fatigue, unspecified type    Hemorrhoids, unspecified hemorrhoid type    Bilateral carotid artery stenosis     Plan: Depressed mood-increase Cymbalta to twice daily.  We will use this for chronic pain and mood.  We discussed possibly adding something else on to Cymbalta once a day if this does not seem to help at twice a day..  Encouraged her to consider counseling.  Discussed more community and social activities with possible.  Her vision and health issues and arthritis limit her activities.  Not able to drive at this point.  Itching of ear-continue  daily moisturizing lotion.  Begin fluocinonide cream as needed for 3 days in a row at a time for flareup.  Diabetes-updated labs today.  Continue current therapy  CAD, hyperlipidemia-continue atorvastatin 40 mg daily  External hemorrhoids and internal hemorrhoids-continue sitz bath's, hot bath soaks, add Proctosol suppository periodically when she sees blood in the stool.  Continue Proctosol cream as needed.  Discussed GI consult but she declines at this time.  Fatigue-likely due to  mood.  Begin trial of Cymbalta twice a day  Hypertension - continue current therapy  Carotid atherosclerosis-follow-up with cardiology/vascular next week to discuss recent ultrasound particular with the right side of being worse  Continue rest of medicines as usual  Dayjah was seen today for diabetes.  Diagnoses and all orders for this visit:  Depressed mood  Itching of ear  Diabetes mellitus with complication (HCC) -     Microalbumin/Creatinine Ratio, Urine -     Hemoglobin A1c  Dyslipidemia associated with type 2 diabetes mellitus (HCC)  Diabetic polyneuropathy associated with type 2 diabetes mellitus (HCC)  Coronary artery disease due to lipid rich plaque  Essential hypertension, benign  Fatigue, unspecified type -     Comprehensive metabolic panel -     CBC -     TSH  Hemorrhoids, unspecified hemorrhoid type  Bilateral carotid artery stenosis  Other orders -     fluocinolone 0.01 % cream; Apply topically at bedtime as needed. Use q 3 days at a time for Itchy ears -     DULoxetine (CYMBALTA) 60 MG capsule; Take 1 capsule (60 mg total) by mouth 2 (two) times daily. -     hydrochlorothiazide (HYDRODIURIL) 25 MG tablet; Take 1 tablet (25 mg total) by mouth daily. -     atorvastatin (LIPITOR) 40 MG tablet; TAKE 1 TABLET(40 MG) BY MOUTH AT BEDTIME -     apixaban (ELIQUIS) 5 MG TABS tablet; Take 1 tablet (5 mg total) by mouth 2 (two) times daily. -     hydrocortisone (ANUSOL-HC) 25 MG  suppository; Place 1 suppository (25 mg total) rectally 2 (two) times daily.   F/u 77mo

## 2023-09-24 LAB — COMPREHENSIVE METABOLIC PANEL
ALT: 24 [IU]/L (ref 0–32)
AST: 32 [IU]/L (ref 0–40)
Albumin: 4.3 g/dL (ref 3.8–4.8)
Alkaline Phosphatase: 131 [IU]/L — ABNORMAL HIGH (ref 44–121)
BUN/Creatinine Ratio: 25 (ref 12–28)
BUN: 20 mg/dL (ref 8–27)
Bilirubin Total: 0.5 mg/dL (ref 0.0–1.2)
CO2: 27 mmol/L (ref 20–29)
Calcium: 9.6 mg/dL (ref 8.7–10.3)
Chloride: 99 mmol/L (ref 96–106)
Creatinine, Ser: 0.8 mg/dL (ref 0.57–1.00)
Globulin, Total: 2.9 g/dL (ref 1.5–4.5)
Glucose: 94 mg/dL (ref 70–99)
Potassium: 4.9 mmol/L (ref 3.5–5.2)
Sodium: 139 mmol/L (ref 134–144)
Total Protein: 7.2 g/dL (ref 6.0–8.5)
eGFR: 76 mL/min/{1.73_m2} (ref >59)

## 2023-09-24 LAB — CBC
Hematocrit: 36.2 % (ref 34.0–46.6)
Hemoglobin: 11.4 g/dL (ref 11.1–15.9)
MCH: 26 pg — ABNORMAL LOW (ref 26.6–33.0)
MCHC: 31.5 g/dL (ref 31.5–35.7)
MCV: 83 fL (ref 79–97)
Platelets: 364 10*3/uL (ref 150–450)
RBC: 4.39 x10E6/uL (ref 3.77–5.28)
RDW: 11.5 % — ABNORMAL LOW (ref 11.7–15.4)
WBC: 10 10*3/uL (ref 3.4–10.8)

## 2023-09-24 LAB — MICROALBUMIN / CREATININE URINE RATIO
Creatinine, Urine: 117.5 mg/dL
Microalb/Creat Ratio: 4 mg/g{creat} (ref 0–29)
Microalbumin, Urine: 4.8 ug/mL

## 2023-09-24 LAB — HEMOGLOBIN A1C
Est. average glucose Bld gHb Est-mCnc: 169 mg/dL
Hgb A1c MFr Bld: 7.5 % — ABNORMAL HIGH (ref 4.8–5.6)

## 2023-09-24 LAB — TSH: TSH: 1.62 u[IU]/mL (ref 0.450–4.500)

## 2023-09-24 NOTE — Progress Notes (Signed)
Results sent through MyChart

## 2023-09-24 NOTE — Progress Notes (Signed)
schedule virtual follow-up in 1 month to recheck on Cymbalta  results sent through MyChart

## 2023-09-29 ENCOUNTER — Telehealth: Payer: Self-pay | Admitting: Internal Medicine

## 2023-09-29 MED ORDER — HYDROCHLOROTHIAZIDE 25 MG PO TABS: 25.0000 mg | ORAL_TABLET | Freq: Every day | ORAL | 1 refills | Status: DC

## 2023-09-29 NOTE — Telephone Encounter (Signed)
Pt called and needs hydrochlorothiazide resent to pharmacy as they said they didn't receive it

## 2023-10-01 ENCOUNTER — Ambulatory Visit: Payer: 59 | Attending: Nurse Practitioner | Admitting: Nurse Practitioner

## 2023-10-01 ENCOUNTER — Encounter: Payer: Self-pay | Admitting: Nurse Practitioner

## 2023-10-01 VITALS — BP 117/62 | HR 90 | Ht 64.0 in | Wt 181.0 lb

## 2023-10-01 DIAGNOSIS — I480 Paroxysmal atrial fibrillation: Secondary | ICD-10-CM | POA: Diagnosis not present

## 2023-10-01 DIAGNOSIS — I6529 Occlusion and stenosis of unspecified carotid artery: Secondary | ICD-10-CM | POA: Diagnosis not present

## 2023-10-01 DIAGNOSIS — R5383 Other fatigue: Secondary | ICD-10-CM

## 2023-10-01 DIAGNOSIS — I739 Peripheral vascular disease, unspecified: Secondary | ICD-10-CM | POA: Diagnosis not present

## 2023-10-01 DIAGNOSIS — I272 Pulmonary hypertension, unspecified: Secondary | ICD-10-CM

## 2023-10-01 DIAGNOSIS — I10 Essential (primary) hypertension: Secondary | ICD-10-CM | POA: Diagnosis not present

## 2023-10-01 DIAGNOSIS — I38 Endocarditis, valve unspecified: Secondary | ICD-10-CM

## 2023-10-01 MED ORDER — ATORVASTATIN CALCIUM 80 MG PO TABS: ORAL_TABLET | ORAL | 1 refills | Status: DC

## 2023-10-01 NOTE — Progress Notes (Addendum)
Cardiology Office Note:  .   Date:  10/01/2023 ID:  Robyn Hill, DOB 1946-10-04, MRN [id number] PCP: Genia Del  [facility] Providers Cardiologist:  Marjo Bicker, MD    History of Present Illness: .   Robyn Hill is a 77 y.o. female with a PMH of A-fib, carotid artery stenosis, cardiac murmur, HTN, GERD, type 2 diabetes, arthritis, chronic back pain, history of thyroid disease, anemia of chronic disease, fatigue, who presents today for A-fib follow-up.  Last seen by Dr. Antoine Poche on January 13, 2022 for evaluation for possible A-fib.  It was stated this is possibly seen on EKG at PCPs office, however Dr. Antoine Poche felt that this was PAT.  Patient wore a monitor that did not reveal A-fib.  Her Apple Watch was reviewed and was felt that the rhythm was A-fib.  Patient noted fatigue at the time, heart rate ranging 50-140s.  She was started on Eliquis due to CHA2DS2-VASc score of 4.  She was also started on low-dose metoprolol 12.5 mg twice daily.  Echocardiogram was arranged -see report below.  I last saw her for office follow-up on August 17, 2023.  She presented her readings from her Apple Watch that showed A-fib readings with the most recent readings from 08/09/2023 that were overall well controlled HR in A-fib. Stated she couldn't tell when she is in A-fib, asymptomatic, just noticed this on her Apple Watch. Patient endorsed some fatigue. Denied any chest pain, shortness of breath, palpitations, syncope, presyncope, dizziness, orthopnea, PND, swelling or significant weight changes, acute bleeding, or claudication. Was requesting to see a new cardiologist. Patient's daughter stated she previously wore a cardiac monitor, could not tolerate due to allergy, had to take off early. Daughter also presented concern regarding patient's previous carotid doppler report.   ROS: Negative. See HPI.   Studies Reviewed: .    Echo 08/2023:  1. Left ventricular ejection fraction, by  estimation, is 60 to 65%. The  left ventricle has normal function. The left ventricle has no regional  wall motion abnormalities. There is mild left ventricular hypertrophy.  Left ventricular diastolic parameters  are indeterminate.   2. Right ventricular systolic function is normal. The right ventricular  size is normal. There is moderately elevated pulmonary artery systolic  pressure.   3. The mitral valve is abnormal. Mild mitral valve regurgitation. No  evidence of mitral stenosis.   4. The tricuspid valve is abnormal.   5. The aortic valve is tricuspid. Aortic valve regurgitation is not  visualized. No aortic stenosis is present.   6. The inferior vena cava is normal in size with greater than 50%  respiratory variability, suggesting right atrial pressure of 3 mmHg.   Comparison(s): EF 65-70%. Mild LVH. RV size mildly increased. Moderate TR-RVSP 46.6 mmHG. Mild MR.  Carotid duplex 08/2023:  Summary:  Right Carotid: Velocities in the right ICA are consistent with a 60-79%  stenosis. Non-hemodynamically significant plaque <50% noted  in the CCA. The ECA appears >50% stenosed.   Left Carotid: Velocities in the left ICA are consistent with a 1-39%  stenosis. Non-hemodynamically significant plaque <50% noted in the  CCA. The ECA appears <50% stenosed.   *See table(s) above for measurements and observations.  Suggest follow up study in 12 months.    Electronically signed by Lorine Bears MD on 09/09/2023 at 1:22:40 PM.  Carotid duplex 09/2022:  Summary:  Right Carotid: Velocities in the right ICA are consistent with a 60-79% stenosis. Non-hemodynamically significant  plaque <50% noted in the CCA. The ECA appears >50% stenosed.   Left Carotid: Velocities in the left ICA are consistent with a 1-39%  stenosis. Non-hemodynamically significant plaque <50% noted in the  CCA.   Vertebrals: Bilateral vertebral arteries demonstrate antegrade flow.  Subclavians: Bilateral subclavian  artery flow was disturbed.   *See table(s) above for measurements and observations.  Suggest follow up study in 12 months.  Echo 01/2022:  IMPRESSIONS    1. Left ventricular ejection fraction, by estimation, is 65 to 70%. The  left ventricle has normal function. The left ventricle has no regional  wall motion abnormalities. There is mild left ventricular hypertrophy.  Left ventricular diastolic parameters  were normal.   2. Right ventricular systolic function is low normal. The right  ventricular size is mildly enlarged. There is moderately elevated  pulmonary artery systolic pressure. The estimated right ventricular  systolic pressure is 46.6 mmHg.   3. Left atrial size was mildly dilated.   4. The mitral valve is grossly normal. Mild mitral valve regurgitation.   5. Tricuspid valve regurgitation is moderate.   6. The aortic valve is tricuspid. There is mild calcification of the  aortic valve. Aortic valve regurgitation is not visualized. Aortic valve  sclerosis/calcification is present, without any evidence of aortic  stenosis. Aortic valve mean gradient  measures 3.0 mmHg.   7. The inferior vena cava is normal in size with greater than 50%  respiratory variability, suggesting right atrial pressure of 3 mmHg.   Comparison(s): No prior Echocardiogram.   Monitor 12/2021:  Normal sinus rhythm is the predominant rhythm Rare ectopy. Blocked PACs First degree AV block No sustained arrhythmias  Lexiscan 05/2016:  Nuclear stress EF: 67%. The left ventricular ejection fraction is normal (55-65%). There was no ST segment deviation noted during stress. No T wave inversion was noted during stress. Defect 1: There is a small defect of mild severity present in the mid anterior location. The study is normal. This is a low risk study.   Low risk stress nuclear study with normal perfusion and normal left ventricular regional and global systolic function. Mild breast attenuation artifact  is seen.   Risk Assessment/Calculations:    CHA2DS2-VASc Score = 5  } This indicates a 7.2% annual risk of stroke. The patient's score is based upon: CHF History: 0 HTN History: 1 Diabetes History: 1 Stroke History: 0 Vascular Disease History: 0 Age Score: 2 Gender Score: 1   STOP-Bang Score:  3  {  The 10-year ASCVD risk score (Arnett DK, et al., 2019) is: 38.1%   Values used to calculate the score:     Age: 37 years     Sex: Female     Is Non-Hispanic African American: No     Diabetic: Yes     Tobacco smoker: No     Systolic Blood Pressure: 117 mmHg     Is BP treated: Yes     HDL Cholesterol: 59 mg/dL     Total Cholesterol: 134 mg/dL  Physical Exam:   VS:  BP 117/62 (BP Location: Left Arm, Patient Position: Sitting)   Pulse 90   Ht 5\' 4"  (1.626 m)   Wt 181 lb (82.1 kg)   SpO2 100%   BMI 31.07 kg/m    Wt Readings from Last 3 Encounters:  10/01/23 181 lb (82.1 kg)  09/23/23 185 lb 9.6 oz (84.2 kg)  08/17/23 186 lb 6.4 oz (84.6 kg)    GEN: Well nourished, well developed in  no acute distress NECK: No JVD; No carotid bruits CARDIAC: S1/S2, RRR, no murmurs, rubs, gallops RESPIRATORY:  Clear to auscultation without rales, wheezing or rhonchi  EXTREMITIES:  No edema; No deformity   ASSESSMENT AND PLAN: .    PAF  Denies any recent tachycardia or palpitations. She has noticed some episodes of A-fib, A-fib HR is well controlled. Pt is overall asymptomatic with her A-fib, does appear she has chronic fatigue with etiology unclear/multifactorial. She is not in A-fib today. Pt previously wore monitor, however she believes monitor was thrown out. Continue Eliquis 5 mg twice daily due to CHA2DS2-VASc score of 5.  She is on appropriate dosage, denies any bleeding issues.  Heart healthy diet and regular cardiovascular exercise encouraged.   Carotid artery stenosis, PAD Most recent doppler revealed stable readings from previous year, RICA stenosis at 60-79% with LICA stenosis at  1-39%. Not on aspirin d/t being on Eliquis. Will increase atorvastatin to 80 mg daily to lower LDL < 55. Will arrange FLP/LFT in 2 months per protocol. Heart healthy diet and regular cardiovascular exercise encouraged.   HTN Blood pressure elevated on arrival, repeat BP WNL. Discussed to monitor BP at home at least 2 hours after medications and sitting for 5-10 minutes.  No medication changes at this time. Heart healthy diet and regular cardiovascular exercise encouraged.   Valvular insufficiency Most recent Echo revealed mild tricuspid regurgitation, mild MR. Recommend to update Echo in 3-5 years or sooner if clinically indicated.   4. Pulmonary hypertension Recent Echo revealed stable moderately elevated PASP. Etiology unclear, wonder if patient has OSA. Patient is overall asymptomatic, notes some chronic fatigue-see below. Referring to Pulmonology for OSA evaluation.  Continue current medication regimen.  Continue follow-up with PCP.  Care and ED precautions discussed.  Fatigue Appears that this is chronic.  Etiology multifactorial. Does show some signs/symptoms of OSA, referring to Pulmonology for further evaluation.  Continue follow-up with PCP.   Dispo: Follow-up with me/APP in 3 months or sooner if anything changes.   Signed, Sharlene Dory, NP

## 2023-10-01 NOTE — Patient Instructions (Addendum)
Medication Instructions:  Your physician has recommended you make the following change in your medication:  Please Increase Atorvastatin to 80 Mg   Labwork: In 2 months at [facility]   Testing/Procedures: None   Follow-Up: Your physician recommends that you schedule a follow-up appointment in: 3 months   Any Other Special Instructions Will Be Listed Below (If Applicable).  If you need a refill on your cardiac medications before your next appointment, please call your pharmacy.

## 2023-10-14 ENCOUNTER — Other Ambulatory Visit: Payer: Self-pay | Admitting: Nurse Practitioner

## 2023-10-18 ENCOUNTER — Encounter: Payer: Self-pay | Admitting: *Deleted

## 2023-10-19 ENCOUNTER — Other Ambulatory Visit: Payer: Self-pay | Admitting: Medical

## 2023-10-21 ENCOUNTER — Telehealth: Payer: Self-pay

## 2023-10-21 ENCOUNTER — Other Ambulatory Visit (HOSPITAL_COMMUNITY): Payer: Self-pay

## 2023-10-21 NOTE — Telephone Encounter (Signed)
 Pharmacy Patient Advocate Encounter   Received notification from CoverMyMeds that prior authorization for TRULICITY  is required/requested.   Insurance verification completed.   The patient is insured through [facility] .   Per test claim: PA required; PA submitted to above mentioned insurance via CoverMyMeds Key/confirmation #/EOC (Key: [id number])  Status is pending

## 2023-10-22 ENCOUNTER — Other Ambulatory Visit (HOSPITAL_COMMUNITY): Payer: Self-pay

## 2023-10-22 NOTE — Telephone Encounter (Signed)
 Pharmacy Patient Advocate Encounter  Received notification from [employer] that Prior Authorization for TRULICITY   has been APPROVED from 1.3.25 to 12.31.25 . Ran test claim, Copay is $RTS AND IS PAYABLE AGAIN ON/AFTER 1.24.25. This test claim was processed through [facility]- copay amounts may vary at other pharmacies due to pharmacy/plan contracts, or as the patient moves through the different stages of their insurance plan.   PA #/Case ID/Reference #: (Key: [id number])

## 2023-10-25 ENCOUNTER — Encounter: Payer: Self-pay | Admitting: Medical

## 2023-10-25 ENCOUNTER — Telehealth (INDEPENDENT_AMBULATORY_CARE_PROVIDER_SITE_OTHER): Payer: 59 | Admitting: Medical

## 2023-10-25 VITALS — Ht 64.0 in | Wt 172.0 lb

## 2023-10-25 DIAGNOSIS — G8929 Other chronic pain: Secondary | ICD-10-CM | POA: Diagnosis not present

## 2023-10-25 DIAGNOSIS — M5441 Lumbago with sciatica, right side: Secondary | ICD-10-CM | POA: Diagnosis not present

## 2023-10-25 DIAGNOSIS — K649 Unspecified hemorrhoids: Secondary | ICD-10-CM

## 2023-10-25 DIAGNOSIS — I4891 Unspecified atrial fibrillation: Secondary | ICD-10-CM

## 2023-10-25 DIAGNOSIS — Z79899 Other long term (current) drug therapy: Secondary | ICD-10-CM | POA: Diagnosis not present

## 2023-10-25 DIAGNOSIS — R4589 Other symptoms and signs involving emotional state: Secondary | ICD-10-CM

## 2023-10-25 DIAGNOSIS — L299 Pruritus, unspecified: Secondary | ICD-10-CM

## 2023-10-25 DIAGNOSIS — R5382 Chronic fatigue, unspecified: Secondary | ICD-10-CM

## 2023-10-25 NOTE — Progress Notes (Signed)
 Subjective:     Patient ID: Robyn Hill, female   DOB: 1946-09-03, 78 y.o.   MRN: [id number]  This visit type was conducted due to national recommendations for restrictions regarding the COVID-19 Pandemic (e.g. social distancing) in an effort to limit this patient's exposure and mitigate transmission in our community.  Due to their co-morbid illnesses, this patient is at least at moderate risk for complications without adequate follow up.  This format is felt to be most appropriate for this patient at this time.    Documentation for virtual audio and video telecommunications through [place] encounter:  The patient was located at home. The provider was located in the office. The patient did consent to this visit and is aware of possible charges through their insurance for this visit.  The other persons participating in this telemedicine service were none. Time spent on call was 20 minutes and in review of previous records 20 minutes total.  This virtual service is not related to other E/M service within previous 7 days.   HPI Chief Complaint  Patient presents with   Depression    Cymbalta  follow up. No side effects and comfortable with doasge.    Virtual for follow-up from last visit in early December for med check.  At her last visit she was complaining of a little worse mood and chronic pain, chronic fatigue.  We increased her Cymbalta  to twice daily.  So she is currently using Cymbalta  60 mg twice daily   son Robyn Hill on the road with his truck, Tammy living in [place], not with her currently.  Her daughter moved on her own about a year ago. Son Robyn Hill lives with her but is not home daily, long distance truck driver.   We also discussed more community and social activities, counseling.  She has not only been doing the more with this but fortunately her son does live with her so she is not completely alone.  Her vision and health issues and arthritis limit her activities.  Not able to  drive at this point.   Last visit she had ear itching.  We advised she continue daily moisturizing lotion.  We began fluocinonide cream as needed for 3 days in a row at a time for flareup.  That is helping.  The last few nights been having afib spells.  She is compliant with diltiazem  and her anticoagulation  Last visit we discussed hemorrhoid issues but she has not had a single problem since her last visit  No other aggravating or relieving factors. No other complaint.  Past Medical History:  Diagnosis Date   Adrenal mass, right (HCC)    Allergy    Anemia    Asthma    as a teenager   Blind right eye    since childhood   Blood transfusion 1970   Chronic back pain    Chronic diarrhea    Colestipol  therapy   Depression    Diverticulitis    Diverticulosis    Fatty liver    Female bladder prolapse    GERD (gastroesophageal reflux disease)    Glaucoma    H/O bone density study 11/29/2014   normal study although mild decreased in density from prior study   H/O hiatal hernia    Hemorrhoids    History of kidney stones    History of uterine cancer    s/p hysterectomy   Hyperlipidemia    Hypertension    Migraine    hx of migraines in past  Neuromuscular disorder (HCC)    diabetic neuropahthy   Numbness and tingling of both legs    Osteoarthritis    Rheumatic fever    age 89   Type II diabetes mellitus (HCC)    Urinary incontinence    Vitamin D  deficiency    Current Outpatient Medications on File Prior to Visit  Medication Sig Dispense Refill   apixaban  (ELIQUIS ) 5 MG TABS tablet Take 1 tablet (5 mg total) by mouth 2 (two) times daily. 180 tablet 1   atorvastatin  (LIPITOR) 80 MG tablet TAKE 1 TABLET(40 MG) BY MOUTH AT BEDTIME 90 tablet 1   brimonidine  (ALPHAGAN ) 0.2 % ophthalmic solution Place 1 drop into the left eye 2 times daily.     celecoxib  (CELEBREX ) 200 MG capsule TAKE ONE CAPSULE BY MOUTH DAILY 90 capsule 0   Cholecalciferol  (VITAMIN D ) 50 MCG (2000 UT) CAPS  Take 1 capsule (2,000 Units total) by mouth daily. 90 capsule 3   Continuous Glucose Sensor (FREESTYLE LIBRE 3 SENSOR) MISC place 1 sensor EVERY 14 DAYS 2 each 5   dexlansoprazole  (DEXILANT ) 60 MG capsule TAKE ONE CAPSULE BY MOUTH TWICE DAILY 180 capsule 1   diltiazem  (CARDIZEM ) 30 MG tablet TAKE 1 TABLET BY MOUTH TWICE DAILY 180 tablet 3   dorzolamide -timolol  (COSOPT ) 22.3-6.8 MG/ML ophthalmic solution Place 1 drop into both eyes 2 (two) times daily.     DULoxetine  (CYMBALTA ) 60 MG capsule Take 1 capsule (60 mg total) by mouth 2 (two) times daily. 60 capsule 1   EASY TOUCH PEN NEEDLES 31G X 5 MM MISC USE AS DIRECTED TO INJECT TOUJEO  DAILY 100 each 0   enalapril  (VASOTEC ) 20 MG tablet TAKE 1 TABLET BY MOUTH DAILY 90 tablet 1   fluocinolone  0.01 % cream Apply topically at bedtime as needed. Use q 3 days at a time for Itchy ears 30 g 0   hydrochlorothiazide  (HYDRODIURIL ) 25 MG tablet Take 1 tablet (25 mg total) by mouth daily. 90 tablet 1   hydrocortisone  (ANUSOL -HC) 25 MG suppository Place 1 suppository (25 mg total) rectally 2 (two) times daily. 40 suppository 1   latanoprost  (XALATAN ) 0.005 % ophthalmic solution Place 1 drop into the right eye at bedtime.     LINZESS  72 MCG capsule TAKE ONE CAPSULE BY MOUTH EVERY DAY BEFORE breakfast 90 capsule 2   metFORMIN  (GLUCOPHAGE ) 500 MG tablet TAKE 1 TABLET BY MOUTH TWICE DAILY WITH MEALS 180 tablet 1   morphine  (MSIR) 15 MG tablet Take 15 mg by mouth every 6 (six) hours as needed.     PROCTO-MED HC  2.5 % rectal cream APPLY RECTALLY TO THE AFFECTED AREA(S) TWICE DAILY 28 g 0   TOUJEO  SOLOSTAR 300 UNIT/ML Solostar Pen INJECT 50 UNITS INTO THE SKIN DAILY 15 mL 0   triamcinolone  cream (KENALOG ) 0.1 % Apply 1 Application topically daily as needed. PRN     TRULICITY  3 MG/0.5ML SOAJ INJECT 3 MG (1 pen) UNDER THE SKIN ONCE a WEEK 2 mL 1   Probiotic Product (PROBIOTIC BLEND PO) Take by mouth. (Patient not taking: Reported on 10/25/2023)     No current  facility-administered medications on file prior to visit.    Review of Systems As in subjective      Objective:   Physical Exam Due to coronavirus pandemic stay at home measures, patient visit was virtual and they were not examined in person.   Ht 5' 4 (1.626 m) Comment: patient reported  Wt 172 lb (78 kg) Comment: patient reported  BMI  29.52 kg/m   Gen: wd, wn ,nad Psych: pleasant, answers questions appropriately      Assessment:     Encounter Diagnoses  Name Primary?   Depressed mood Yes   Polypharmacy    Other chronic pain    Acute right-sided low back pain with right-sided sciatica    Atrial fibrillation, unspecified type (HCC)    Ear itch    Hemorrhoids, unspecified hemorrhoid type    Chronic fatigue         Plan:     Depressed mood, chronic pain-she does see some improvement in her symptoms since going up the Cymbalta  60 mg twice daily.  Continue current therapy.  Counseled on other ways to help with mood.  She does not her son Robyn Hill.  [place]  She does not want to move into assisted living or closer into town at this time.  A-fib-advise she call or contact cardiology as she may need to have her diltiazem  dose increased given some recent more frequent A-fib activity.  Continue Eliquis  anticoagulation  History of A-fib, chronic fatigue-referral to [facility] long sleep center for updated sleep evaluation.  Last sleep study 2017  Hemorrhoids-last visit we discussed treatment recommendations.  Fortunately she has not had any flareups since her last visit but she does have medication just in case  Ear itching has improved with a moisturizing lotion and periodic use of fluocinolone  cream    Robyn Hill was seen today for depression.  Diagnoses and all orders for this visit:  Depressed mood  Polypharmacy  Other chronic pain  Acute right-sided low back pain with right-sided sciatica  Atrial fibrillation, unspecified type (HCC) -      Ambulatory referral to Sleep Studies  Ear itch  Hemorrhoids, unspecified hemorrhoid type  Chronic fatigue -     Ambulatory referral to Sleep Studies    F/u pending sleep eval

## 2023-11-01 ENCOUNTER — Other Ambulatory Visit: Payer: Self-pay | Admitting: Medical

## 2023-11-08 MED ORDER — DILTIAZEM HCL 60 MG PO TABS: 60.0000 mg | ORAL_TABLET | Freq: Three times a day (TID) | ORAL | 5 refills | Status: DC

## 2023-11-09 ENCOUNTER — Inpatient Hospital Stay (HOSPITAL_COMMUNITY)
Admission: EM | Admit: 2023-11-09 | Discharge: 2023-11-12 | DRG: 378 | Disposition: A | Discharge status: Home / Self Care | Payer: 59 | Attending: Family Medicine | Admitting: Family Medicine

## 2023-11-09 ENCOUNTER — Ambulatory Visit: Payer: 59 | Admitting: Medical

## 2023-11-09 ENCOUNTER — Other Ambulatory Visit: Payer: Self-pay

## 2023-11-09 ENCOUNTER — Encounter (HOSPITAL_COMMUNITY): Payer: Self-pay

## 2023-11-09 VITALS — BP 140/82 | HR 70 | Temp 97.1°F | Wt 181.6 lb

## 2023-11-09 DIAGNOSIS — G894 Chronic pain syndrome: Secondary | ICD-10-CM | POA: Diagnosis present

## 2023-11-09 DIAGNOSIS — D120 Benign neoplasm of cecum: Secondary | ICD-10-CM | POA: Diagnosis present

## 2023-11-09 DIAGNOSIS — E669 Obesity, unspecified: Secondary | ICD-10-CM | POA: Diagnosis present

## 2023-11-09 DIAGNOSIS — Z8679 Personal history of other diseases of the circulatory system: Secondary | ICD-10-CM

## 2023-11-09 DIAGNOSIS — K922 Gastrointestinal hemorrhage, unspecified: Secondary | ICD-10-CM | POA: Diagnosis present

## 2023-11-09 DIAGNOSIS — I6523 Occlusion and stenosis of bilateral carotid arteries: Secondary | ICD-10-CM | POA: Diagnosis present

## 2023-11-09 DIAGNOSIS — K625 Hemorrhage of anus and rectum: Secondary | ICD-10-CM

## 2023-11-09 DIAGNOSIS — R5382 Chronic fatigue, unspecified: Secondary | ICD-10-CM | POA: Diagnosis present

## 2023-11-09 DIAGNOSIS — K31811 Angiodysplasia of stomach and duodenum with bleeding: Secondary | ICD-10-CM | POA: Diagnosis present

## 2023-11-09 DIAGNOSIS — J45909 Unspecified asthma, uncomplicated: Secondary | ICD-10-CM | POA: Diagnosis not present

## 2023-11-09 DIAGNOSIS — E119 Type 2 diabetes mellitus without complications: Secondary | ICD-10-CM | POA: Diagnosis present

## 2023-11-09 DIAGNOSIS — D62 Acute posthemorrhagic anemia: Secondary | ICD-10-CM | POA: Diagnosis present

## 2023-11-09 DIAGNOSIS — I4891 Unspecified atrial fibrillation: Secondary | ICD-10-CM | POA: Diagnosis not present

## 2023-11-09 DIAGNOSIS — Z800 Family history of malignant neoplasm of digestive organs: Secondary | ICD-10-CM

## 2023-11-09 DIAGNOSIS — R319 Hematuria, unspecified: Secondary | ICD-10-CM

## 2023-11-09 DIAGNOSIS — I480 Paroxysmal atrial fibrillation: Secondary | ICD-10-CM | POA: Diagnosis present

## 2023-11-09 DIAGNOSIS — D638 Anemia in other chronic diseases classified elsewhere: Secondary | ICD-10-CM

## 2023-11-09 DIAGNOSIS — D509 Iron deficiency anemia, unspecified: Secondary | ICD-10-CM | POA: Diagnosis present

## 2023-11-09 DIAGNOSIS — Z9049 Acquired absence of other specified parts of digestive tract: Secondary | ICD-10-CM

## 2023-11-09 DIAGNOSIS — T45515A Adverse effect of anticoagulants, initial encounter: Secondary | ICD-10-CM | POA: Diagnosis present

## 2023-11-09 DIAGNOSIS — Z90710 Acquired absence of both cervix and uterus: Secondary | ICD-10-CM

## 2023-11-09 DIAGNOSIS — Z886 Allergy status to analgesic agent status: Secondary | ICD-10-CM

## 2023-11-09 DIAGNOSIS — Z833 Family history of diabetes mellitus: Secondary | ICD-10-CM

## 2023-11-09 DIAGNOSIS — K5730 Diverticulosis of large intestine without perforation or abscess without bleeding: Secondary | ICD-10-CM | POA: Diagnosis present

## 2023-11-09 DIAGNOSIS — Z7901 Long term (current) use of anticoagulants: Secondary | ICD-10-CM

## 2023-11-09 DIAGNOSIS — Z8542 Personal history of malignant neoplasm of other parts of uterus: Secondary | ICD-10-CM

## 2023-11-09 DIAGNOSIS — K649 Unspecified hemorrhoids: Secondary | ICD-10-CM | POA: Diagnosis not present

## 2023-11-09 DIAGNOSIS — Z6830 Body mass index (BMI) 30.0-30.9, adult: Secondary | ICD-10-CM

## 2023-11-09 DIAGNOSIS — K5909 Other constipation: Secondary | ICD-10-CM

## 2023-11-09 DIAGNOSIS — K644 Residual hemorrhoidal skin tags: Secondary | ICD-10-CM | POA: Diagnosis not present

## 2023-11-09 DIAGNOSIS — M1990 Unspecified osteoarthritis, unspecified site: Secondary | ICD-10-CM

## 2023-11-09 DIAGNOSIS — I771 Stricture of artery: Secondary | ICD-10-CM | POA: Diagnosis present

## 2023-11-09 DIAGNOSIS — Z794 Long term (current) use of insulin: Secondary | ICD-10-CM

## 2023-11-09 DIAGNOSIS — D123 Benign neoplasm of transverse colon: Secondary | ICD-10-CM | POA: Diagnosis present

## 2023-11-09 DIAGNOSIS — Z7984 Long term (current) use of oral hypoglycemic drugs: Secondary | ICD-10-CM | POA: Diagnosis not present

## 2023-11-09 DIAGNOSIS — K219 Gastro-esophageal reflux disease without esophagitis: Secondary | ICD-10-CM | POA: Diagnosis present

## 2023-11-09 DIAGNOSIS — Z5986 Financial insecurity: Secondary | ICD-10-CM

## 2023-11-09 DIAGNOSIS — K760 Fatty (change of) liver, not elsewhere classified: Secondary | ICD-10-CM | POA: Diagnosis present

## 2023-11-09 DIAGNOSIS — M549 Dorsalgia, unspecified: Secondary | ICD-10-CM | POA: Diagnosis present

## 2023-11-09 DIAGNOSIS — K635 Polyp of colon: Secondary | ICD-10-CM | POA: Diagnosis not present

## 2023-11-09 DIAGNOSIS — F411 Generalized anxiety disorder: Secondary | ICD-10-CM | POA: Diagnosis present

## 2023-11-09 DIAGNOSIS — Z8249 Family history of ischemic heart disease and other diseases of the circulatory system: Secondary | ICD-10-CM

## 2023-11-09 DIAGNOSIS — D6832 Hemorrhagic disorder due to extrinsic circulating anticoagulants: Secondary | ICD-10-CM | POA: Diagnosis present

## 2023-11-09 DIAGNOSIS — K648 Other hemorrhoids: Secondary | ICD-10-CM | POA: Diagnosis present

## 2023-11-09 DIAGNOSIS — E785 Hyperlipidemia, unspecified: Secondary | ICD-10-CM | POA: Diagnosis present

## 2023-11-09 DIAGNOSIS — K5790 Diverticulosis of intestine, part unspecified, without perforation or abscess without bleeding: Secondary | ICD-10-CM | POA: Diagnosis not present

## 2023-11-09 DIAGNOSIS — K449 Diaphragmatic hernia without obstruction or gangrene: Secondary | ICD-10-CM | POA: Diagnosis not present

## 2023-11-09 DIAGNOSIS — I2510 Atherosclerotic heart disease of native coronary artery without angina pectoris: Secondary | ICD-10-CM | POA: Diagnosis present

## 2023-11-09 DIAGNOSIS — Q2733 Arteriovenous malformation of digestive system vessel: Secondary | ICD-10-CM | POA: Diagnosis not present

## 2023-11-09 DIAGNOSIS — Z79899 Other long term (current) drug therapy: Secondary | ICD-10-CM | POA: Diagnosis not present

## 2023-11-09 DIAGNOSIS — H409 Unspecified glaucoma: Secondary | ICD-10-CM | POA: Diagnosis present

## 2023-11-09 DIAGNOSIS — I10 Essential (primary) hypertension: Secondary | ICD-10-CM | POA: Diagnosis present

## 2023-11-09 DIAGNOSIS — Z7985 Long-term (current) use of injectable non-insulin antidiabetic drugs: Secondary | ICD-10-CM

## 2023-11-09 DIAGNOSIS — H543 Unqualified visual loss, both eyes: Secondary | ICD-10-CM | POA: Diagnosis present

## 2023-11-09 DIAGNOSIS — Z87442 Personal history of urinary calculi: Secondary | ICD-10-CM

## 2023-11-09 LAB — I-STAT CHEM 8, ED
BUN: 17 mg/dL (ref 8–23)
Calcium, Ion: 1.21 mmol/L (ref 1.15–1.40)
Chloride: 101 mmol/L (ref 98–111)
Creatinine, Ser: 0.8 mg/dL (ref 0.44–1.00)
Glucose, Bld: 109 mg/dL — ABNORMAL HIGH (ref 70–99)
HCT: 31 % — ABNORMAL LOW (ref 36.0–46.0)
Hemoglobin: 10.5 g/dL — ABNORMAL LOW (ref 12.0–15.0)
Potassium: 3.9 mmol/L (ref 3.5–5.1)
Sodium: 139 mmol/L (ref 135–145)
TCO2: 27 mmol/L (ref 22–32)

## 2023-11-09 LAB — CBC
HCT: 32 % — ABNORMAL LOW (ref 36.0–46.0)
Hemoglobin: 10.1 g/dL — ABNORMAL LOW (ref 12.0–15.0)
MCH: 25.4 pg — ABNORMAL LOW (ref 26.0–34.0)
MCHC: 31.6 g/dL (ref 30.0–36.0)
MCV: 80.4 fL (ref 80.0–100.0)
Platelets: 340 10*3/uL (ref 150–400)
RBC: 3.98 MIL/uL (ref 3.87–5.11)
RDW: 12.8 % (ref 11.5–15.5)
WBC: 9.2 10*3/uL (ref 4.0–10.5)
nRBC: 0 % (ref 0.0–0.2)

## 2023-11-09 LAB — COMPREHENSIVE METABOLIC PANEL
ALT: 24 U/L (ref 0–44)
AST: 31 U/L (ref 15–41)
Albumin: 3.8 g/dL (ref 3.5–5.0)
Alkaline Phosphatase: 74 U/L (ref 38–126)
Anion gap: 9 (ref 5–15)
BUN: 18 mg/dL (ref 8–23)
CO2: 26 mmol/L (ref 22–32)
Calcium: 9.2 mg/dL (ref 8.9–10.3)
Chloride: 102 mmol/L (ref 98–111)
Creatinine, Ser: 0.87 mg/dL (ref 0.44–1.00)
GFR, Estimated: >60 mL/min (ref >60)
Glucose, Bld: 115 mg/dL — ABNORMAL HIGH (ref 70–99)
Potassium: 3.8 mmol/L (ref 3.5–5.1)
Sodium: 137 mmol/L (ref 135–145)
Total Bilirubin: 0.6 mg/dL (ref 0.0–1.2)
Total Protein: 6.9 g/dL (ref 6.5–8.1)

## 2023-11-09 LAB — POCT URINALYSIS DIP (PROADVANTAGE DEVICE)
Blood, UA: NEGATIVE
Glucose, UA: NEGATIVE mg/dL
Ketones, POC UA: NEGATIVE mg/dL
Nitrite, UA: NEGATIVE
Protein Ur, POC: NEGATIVE mg/dL
Specific Gravity, Urine: 1.015
Urobilinogen, Ur: NEGATIVE
pH, UA: 6.5 (ref 5.0–8.0)

## 2023-11-09 LAB — GLUCOSE, CAPILLARY: Glucose-Capillary: 147 mg/dL — ABNORMAL HIGH (ref 70–99)

## 2023-11-09 LAB — POC OCCULT BLOOD, ED: Fecal Occult Bld: POSITIVE — AB

## 2023-11-09 LAB — PREPARE RBC (CROSSMATCH)

## 2023-11-09 LAB — PROTIME-INR
INR: 1 (ref 0.8–1.2)
Prothrombin Time: 13.2 s (ref 11.4–15.2)

## 2023-11-09 MED ORDER — ONDANSETRON HCL 4 MG PO TABS
4.0000 mg | ORAL_TABLET | Freq: Four times a day (QID) | ORAL | Status: DC | PRN
  Administered 2023-11-11: 4 mg via ORAL
  Filled 2023-11-09: qty 1

## 2023-11-09 MED ORDER — TRAMADOL HCL 50 MG PO TABS: 50.0000 mg | ORAL_TABLET | Freq: Three times a day (TID) | ORAL | Status: DC | PRN

## 2023-11-09 MED ORDER — DILTIAZEM HCL 60 MG PO TABS
60.0000 mg | ORAL_TABLET | Freq: Three times a day (TID) | ORAL | Status: DC
  Administered 2023-11-09 – 2023-11-12 (×7): 60 mg via ORAL
  Filled 2023-11-09: qty 1
  Filled 2023-11-09: qty 2
  Filled 2023-11-09: qty 1
  Filled 2023-11-09 (×2): qty 2
  Filled 2023-11-09: qty 1
  Filled 2023-11-09: qty 2
  Filled 2023-11-09: qty 1

## 2023-11-09 MED ORDER — MORPHINE SULFATE 15 MG PO TABS
15.0000 mg | ORAL_TABLET | Freq: Four times a day (QID) | ORAL | Status: DC | PRN
  Administered 2023-11-10 – 2023-11-12 (×4): 15 mg via ORAL
  Filled 2023-11-09 (×4): qty 1

## 2023-11-09 MED ORDER — ONDANSETRON HCL 4 MG/2ML IJ SOLN
4.0000 mg | Freq: Four times a day (QID) | INTRAMUSCULAR | Status: DC | PRN
  Administered 2023-11-11: 4 mg via INTRAVENOUS
  Filled 2023-11-09: qty 2

## 2023-11-09 MED ORDER — HYDROCHLOROTHIAZIDE 12.5 MG PO TABS: 25.0000 mg | ORAL_TABLET | Freq: Every day | ORAL | Status: DC

## 2023-11-09 MED ORDER — HYDROCORTISONE ACETATE 25 MG RE SUPP
25.0000 mg | Freq: Two times a day (BID) | RECTAL | Status: DC
  Filled 2023-11-09 (×7): qty 1

## 2023-11-09 MED ORDER — SODIUM CHLORIDE 0.9% FLUSH
3.0000 mL | Freq: Two times a day (BID) | INTRAVENOUS | Status: DC
  Administered 2023-11-10 – 2023-11-12 (×4): 3 mL via INTRAVENOUS

## 2023-11-09 MED ORDER — HYDROCHLOROTHIAZIDE 25 MG PO TABS
25.0000 mg | ORAL_TABLET | Freq: Every day | ORAL | Status: DC
  Administered 2023-11-10 – 2023-11-12 (×3): 25 mg via ORAL
  Filled 2023-11-09 (×2): qty 2
  Filled 2023-11-09: qty 1

## 2023-11-09 MED ORDER — PANTOPRAZOLE SODIUM 40 MG IV SOLR
40.0000 mg | Freq: Two times a day (BID) | INTRAVENOUS | Status: DC
  Administered 2023-11-09 – 2023-11-12 (×6): 40 mg via INTRAVENOUS
  Filled 2023-11-09 (×6): qty 10

## 2023-11-09 MED ORDER — SODIUM CHLORIDE 0.9% FLUSH
3.0000 mL | Freq: Two times a day (BID) | INTRAVENOUS | Status: DC
  Administered 2023-11-09 – 2023-11-12 (×6): 3 mL via INTRAVENOUS

## 2023-11-09 MED ORDER — SODIUM CHLORIDE 0.9 % IV SOLN
250.0000 mL | INTRAVENOUS | Status: AC | PRN
Start: 2023-11-09 — End: 2023-11-10

## 2023-11-09 MED ORDER — SODIUM CHLORIDE 0.9% IV SOLUTION: Freq: Once | INTRAVENOUS | Status: DC

## 2023-11-09 MED ORDER — SODIUM CHLORIDE 0.45 % IV SOLN: INTRAVENOUS | Status: AC

## 2023-11-09 MED ORDER — SENNOSIDES-DOCUSATE SODIUM 8.6-50 MG PO TABS: 1.0000 | ORAL_TABLET | Freq: Every evening | ORAL | Status: DC | PRN

## 2023-11-09 MED ORDER — INSULIN ASPART 100 UNIT/ML IJ SOLN
0.0000 [IU] | Freq: Every day | INTRAMUSCULAR | Status: DC
  Filled 2023-11-09: qty 0.05

## 2023-11-09 MED ORDER — DULOXETINE HCL 60 MG PO CPEP
60.0000 mg | ORAL_CAPSULE | Freq: Two times a day (BID) | ORAL | Status: DC
  Administered 2023-11-09 – 2023-11-12 (×6): 60 mg via ORAL
  Filled 2023-11-09 (×2): qty 2
  Filled 2023-11-09: qty 1
  Filled 2023-11-09 (×2): qty 2
  Filled 2023-11-09: qty 1

## 2023-11-09 MED ORDER — SODIUM CHLORIDE 0.9% FLUSH: 3.0000 mL | INTRAVENOUS | Status: DC | PRN

## 2023-11-09 MED ORDER — ENALAPRIL MALEATE 10 MG PO TABS
20.0000 mg | ORAL_TABLET | Freq: Every day | ORAL | Status: DC
Start: 2023-11-10 — End: 2023-11-12
  Administered 2023-11-11 – 2023-11-12 (×2): 20 mg via ORAL
  Filled 2023-11-09 (×3): qty 2

## 2023-11-09 MED ORDER — ATORVASTATIN CALCIUM 40 MG PO TABS
80.0000 mg | ORAL_TABLET | Freq: Every day | ORAL | Status: DC
  Administered 2023-11-10 – 2023-11-12 (×2): 80 mg via ORAL
  Filled 2023-11-09 (×3): qty 2

## 2023-11-09 MED ORDER — INSULIN ASPART 100 UNIT/ML IJ SOLN
0.0000 [IU] | Freq: Three times a day (TID) | INTRAMUSCULAR | Status: DC
  Administered 2023-11-10: 1 [IU] via SUBCUTANEOUS
  Filled 2023-11-09: qty 0.06

## 2023-11-09 MED ORDER — ATORVASTATIN CALCIUM 40 MG PO TABS: 40.0000 mg | ORAL_TABLET | Freq: Every day | ORAL | Status: DC

## 2023-11-09 NOTE — Progress Notes (Signed)
Subjective:  Robyn Hill is a 78 y.o. female who presents for Chief Complaint  Patient presents with   Rectal Bleeding    Rectal bleeding x for a couple weeks, lower abdominal pain. Hurts when she has a bowel movement. Really dark stools that are flakey and mucous Urinating Blood as well.       Patient Care Team: Berish Bohman, Kermit Balo, PA-C as PCP - General (Family Medicine) Mallipeddi, Orion Modest, MD as PCP - Cardiology (Cardiology) Mikal Plane, MD as Consulting Physician MacDiarmid, Lorin Picket, MD as Consulting Physician (Urology) Rollene Rotunda, MD as Consulting Physician (Cardiology) Doreatha Massed, MD as Consulting Physician (Hematology) Renaldo Fiddler, MD as Consulting Physician (Pain Medicine) Maeola Harman, MD as Consulting Physician (Neurosurgery) Dr. Norlene Campbell, orthopedics Dr. Stephanie Acre, neurology Celedonio Miyamoto, PA-C neurosurgery   Concerns: Here with daughter Babette Relic today for concerns of blood.  She had been complaining of hemorrhoids for a while but has had a lot of blood lately.  She does have itching and irritation at the rectum aggravated by what she feels is hemorrhoids, but for the last couple weeks she has had pretty much bleeding every day.  She will get blood on the toilet paper and will fill he whole toilet bowl up with blood.  That blood will be bright red but she has also been seeing dark red stool.  Even when she goes to urinate she will get blood coming from the rectum.  She is having to change pads 2-3 times a day due to blood leaking through from the rectum.  She is concerned because 2 of her brothers had colon cancer  She has complained of fatigue for a while but this excessive bleeding has been going on the last few weeks  She last took her Eliquis yesterday.  She takes her Eliquis twice daily  She is using Celebrex.  We have had discussions before about risk of Celebrex but this 1 only thing that helps her significant arthritis pain so she  continues this despite the risk  She has been in contact in regards to sleep study referral but has not done sleep study yet.  She does note fatigue, and her watch recorded some low oxygen readings in the night, pulse oximeter on her phone  Initially was reported that she was having blood in the urine as well but that is not the case.  She sees blood coming from the rectal area when urinating but not blood from the vagina or with urine.  No urinary symptoms to report current  No other aggravating or relieving factors.    No other c/o.  Past Medical History:  Diagnosis Date   Adrenal mass, right (HCC)    Allergy    Anemia    Asthma    "as a teenager"   Blind right eye    since childhood   Blood transfusion 1970   Chronic back pain    Chronic diarrhea    Colestipol therapy   Depression    Diverticulitis    Diverticulosis    Fatty liver    Female bladder prolapse    GERD (gastroesophageal reflux disease)    Glaucoma    H/O bone density study 11/29/2014   normal study although mild decreased in density from prior study   H/O hiatal hernia    Hemorrhoids    History of kidney stones    History of uterine cancer    s/p hysterectomy   Hyperlipidemia    Hypertension  Migraine    hx of migraines in past    Neuromuscular disorder (HCC)    diabetic neuropahthy   Numbness and tingling of both legs    Osteoarthritis    Rheumatic fever    age 41   Type II diabetes mellitus (HCC)    Urinary incontinence    Vitamin D deficiency    Current Outpatient Medications on File Prior to Visit  Medication Sig Dispense Refill   apixaban (ELIQUIS) 5 MG TABS tablet Take 1 tablet (5 mg total) by mouth 2 (two) times daily. 180 tablet 1   atorvastatin (LIPITOR) 80 MG tablet TAKE 1 TABLET(40 MG) BY MOUTH AT BEDTIME 90 tablet 1   brimonidine (ALPHAGAN) 0.2 % ophthalmic solution Place 1 drop into the left eye 2 times daily.     celecoxib (CELEBREX) 200 MG capsule TAKE ONE CAPSULE BY MOUTH DAILY  90 capsule 0   Cholecalciferol (VITAMIN D) 50 MCG (2000 UT) CAPS Take 1 capsule (2,000 Units total) by mouth daily. 90 capsule 3   Continuous Glucose Sensor (FREESTYLE LIBRE 3 SENSOR) MISC place 1 sensor EVERY 14 DAYS 2 each 5   dexlansoprazole (DEXILANT) 60 MG capsule TAKE ONE CAPSULE BY MOUTH TWICE DAILY 180 capsule 1   diltiazem (CARDIZEM) 60 MG tablet Take 1 tablet (60 mg total) by mouth 3 (three) times daily. 90 tablet 5   dorzolamide-timolol (COSOPT) 22.3-6.8 MG/ML ophthalmic solution Place 1 drop into both eyes 2 (two) times daily.     Dulaglutide (TRULICITY) 3 MG/0.5ML SOAJ INJECT 3 MG (1 pen) UNDER THE SKIN ONCE a WEEK 6 mL 1   DULoxetine (CYMBALTA) 60 MG capsule Take 1 capsule (60 mg total) by mouth 2 (two) times daily. 60 capsule 1   enalapril (VASOTEC) 20 MG tablet TAKE 1 TABLET BY MOUTH DAILY 90 tablet 1   fluocinolone 0.01 % cream Apply topically at bedtime as needed. Use q 3 days at a time for Itchy ears 30 g 0   hydrochlorothiazide (HYDRODIURIL) 25 MG tablet Take 1 tablet (25 mg total) by mouth daily. 90 tablet 1   latanoprost (XALATAN) 0.005 % ophthalmic solution Place 1 drop into the right eye at bedtime.     LINZESS 72 MCG capsule TAKE ONE CAPSULE BY MOUTH EVERY DAY BEFORE breakfast 90 capsule 2   metFORMIN (GLUCOPHAGE) 500 MG tablet TAKE 1 TABLET BY MOUTH TWICE DAILY WITH MEALS 180 tablet 1   morphine (MSIR) 15 MG tablet Take 15 mg by mouth every 6 (six) hours as needed.     Probiotic Product (PROBIOTIC BLEND PO) Take by mouth.     PROCTO-MED HC 2.5 % rectal cream APPLY RECTALLY TO THE AFFECTED AREA(S) TWICE DAILY 28 g 0   TOUJEO SOLOSTAR 300 UNIT/ML Solostar Pen INJECT 50 UNITS INTO THE SKIN DAILY 15 mL 0   EASY TOUCH PEN NEEDLES 31G X 5 MM MISC USE AS DIRECTED TO INJECT TOUJEO DAILY 100 each 0   hydrocortisone (ANUSOL-HC) 25 MG suppository Place 1 suppository (25 mg total) rectally 2 (two) times daily. (Patient not taking: Reported on 11/09/2023) 40 suppository 1    triamcinolone cream (KENALOG) 0.1 % Apply 1 Application topically daily as needed. PRN (Patient not taking: Reported on 11/09/2023)     No current facility-administered medications on file prior to visit.     The following portions of the patient's history were reviewed and updated as appropriate: allergies, current medications, past family history, past medical history, past social history, past surgical history and problem list.  ROS Otherwise as in subjective above   Objective: BP (!) 140/82   Pulse 70   Temp (!) 97.1 F (36.2 C)   Wt 181 lb 9.6 oz (82.4 kg)   SpO2 96%   BMI 31.17 kg/m   BP Readings from Last 3 Encounters:  11/09/23 (!) 140/82  10/01/23 117/62  09/23/23 118/70   Wt Readings from Last 3 Encounters:  11/09/23 181 lb 9.6 oz (82.4 kg)  10/25/23 172 lb (78 kg)  10/01/23 181 lb (82.1 kg)    General appearance: alert, no distress, well developed, well nourished Abdomen: +bs, soft, general tenderness in the suprapubic and left lower quadrant area, otherwise non tender, non distended, no masses, no hepatomegaly, no splenomegaly Pulses: 2+ radial pulses, 2+ pedal pulses, normal cap refill Ext: no edema Rectal: There is a friable 1+ centimeter lesion roundish on the left side of the anus/just inside the anus, there are other smaller external hemorrhoids noted    Assessment: Encounter Diagnoses  Name Primary?   Hematuria, unspecified type Yes   Rectal bleeding    Anemia of chronic disease    Atrial fibrillation, unspecified type (HCC)    Chronic constipation    Current use of long term anticoagulation    Arthritis      Plan: We discussed her concerns and symptoms.  Given her risk factors including long-term anticoagulation last dose yesterday on January 20, given excessive bleeding over the last couple weeks reported, I advise she go to emergency department for urgent eval including labs and likely plan for hospital-based colonoscopy to rule out other  sources of bleeding besides the rectal lesion.  She will need consult with surgery about the rectal lesion as well.  Her daughter will take her to the emergency department now  We have had several discussions about Celebrex with the use of anticoagulation.  She sees neurosurgery for pain management and has not done well off of Celebrex and that is why with a few things that helps.  So she is continue this despite the risk of bleeding.  Likely her pain is such that she is using morphine regularly as well  Khailee was seen today for rectal bleeding.  Diagnoses and all orders for this visit:  Hematuria, unspecified type -     POCT Urinalysis DIP (Proadvantage Device)  Rectal bleeding  Anemia of chronic disease  Atrial fibrillation, unspecified type (HCC)  Chronic constipation  Current use of long term anticoagulation  Arthritis    Follow up: Report to the emergency department

## 2023-11-09 NOTE — ED Notes (Signed)
Pt hard stick.

## 2023-11-09 NOTE — ED Triage Notes (Signed)
C/o lower abd pain described as pressure with rectal bleeding described as black x2 weeks. Patient is currently on eliquis.  Pt reports hx of hemorrhoids.

## 2023-11-09 NOTE — H&P (Signed)
History and Physical    Robyn Hill:[id number] DOB: 1945/12/09 DOA: 11/09/2023  PCP: Jac Canavan, PA-C   Patient coming from: Home   Chief Complaint:  Chief Complaint  Patient presents with   Rectal Bleeding   ED TRIAGE note:C/o lower abd pain described as pressure with rectal bleeding described as black x2 weeks. Patient is currently on eliquis.  Pt reports hx of hemorrhoids.   HPI:  Robyn Hill is a 78 y.o. female with medical history significant of atrial fibrillation on Eliquis, DM type II, CAD, hyperlipidemia, external/internal hemorrhoid, chronic fatigue, hypertension, chronic carotid atherosclerosis angina anxiety disorder presented to emergency department complaining of black stool for 2 weeks.  Patient reported that she has chronic history of diverticulosis, GI polyp, both internal and external hemorrhoids in the past.  She always have bleeding per rectum with bowel movement however during this time is has been persistent for last 2 weeks.  Reported she has 1 episode of black tarry stool few days ago which resolved.  Patient denies any abdominal pain, constipation and diarrhea. Patient denies any shortness of breath, palpitation, headache, and dizziness.  No other complaint at this time.  ED Course:  At presentation to ED patient is hemodynamically stable except found to have elevated blood pressure 171/82. FOBT positive. CBC showing hemoglobin 10.1 hematocrit 38.  Hemoglobin around 11.4 one-month ago and fourteen 1 year ago. CMP unremarkable. UA leukocyte esterase positive and bilirubin present.  ED patient is hemodynamically stable.  ED physician Dr. Maple Hudson has been spoke with [facility] Dr. Dulce Sellar who will evaluate patient for consult in the morning.  Hospitalist has been contacted for further evaluation and management of lower GI bleed in the setting of chronic hemorrhoid.   Significant labs in the ED: Lab Orders         Comprehensive metabolic panel          CBC         Protime-INR         Hemoglobin and hematocrit, blood         Comprehensive metabolic panel         CBC         Hemoglobin A1c         POC occult blood, ED         I-stat chem 8, ED (not at [facility], [facility] or [facility])       Review of Systems:  Review of Systems  Constitutional:  Negative for chills, fever, malaise/fatigue and weight loss.  Respiratory:  Negative for cough and shortness of breath.   Cardiovascular:  Negative for chest pain, palpitations and leg swelling.  Gastrointestinal:  Positive for blood in stool and melena. Negative for abdominal pain, constipation, diarrhea, heartburn, nausea and vomiting.  Musculoskeletal:  Negative for back pain, falls, joint pain, myalgias and neck pain.  Neurological:  Negative for dizziness and headaches.  Endo/Heme/Allergies:  Does not bruise/bleed easily.  All other systems reviewed and are negative.   Past Medical History:  Diagnosis Date   Adrenal mass, right (HCC)    Allergy    Anemia    Asthma    "as a teenager"   Blind right eye    since childhood   Blood transfusion 1970   Chronic back pain    Chronic diarrhea    Colestipol therapy   Depression    Diverticulitis    Diverticulosis    Fatty liver    Female bladder prolapse    GERD (  gastroesophageal reflux disease)    Glaucoma    H/O bone density study 11/29/2014   normal study although mild decreased in density from prior study   H/O hiatal hernia    Hemorrhoids    History of kidney stones    History of uterine cancer    s/p hysterectomy   Hyperlipidemia    Hypertension    Migraine    hx of migraines in past    Neuromuscular disorder (HCC)    diabetic neuropahthy   Numbness and tingling of both legs    Osteoarthritis    Rheumatic fever    age 22   Type II diabetes mellitus (HCC)    Urinary incontinence    Vitamin D deficiency     Past Surgical History:  Procedure Laterality Date   ABDOMINAL HYSTERECTOMY  1980   no further pap smears needed    ANTERIOR AND POSTERIOR REPAIR N/A 01/29/2015   Procedure: CYSTOCELE AND RECTOCELE ;  Surgeon: Alfredo Martinez, MD;  Location: [facility];  Service: Urology;  Laterality: N/A;   BACK SURGERY     BREAST BIOPSY  08/1997   right   CARDIAC CATHETERIZATION  11/2006   CHOLECYSTECTOMY     COLONOSCOPY  2014   polyps, external hemorrhoids   CYSTOSCOPY N/A 01/29/2015   Procedure: CYSTOSCOPY;  Surgeon: Alfredo Martinez, MD;  Location: [facility];  Service: Urology;  Laterality: N/A;   DILATION AND CURETTAGE OF UTERUS  1980   EDSI for back pain  last 01/2009   multiple x 5   EYE SURGERY     Bil Blind R eye   KIDNEY STONE SURGERY  1979   LIVER BIOPSY  04/1999   minor laceration repair     right eye   POSTERIOR FUSION LUMBAR SPINE  04/05/12   PULSE GENERATOR IMPLANT Right 03/12/2016   Procedure: Right Reposition of implantable pulse generator;  Surgeon: Maeola Harman, MD;  Location: [facility];  Service: Neurosurgery;  Laterality: Right;  Right Reposition of implantable pulse generator   SPINAL CORD STIMULATOR INSERTION N/A 09/06/2015   Procedure: LUMBAR SPINAL CORD STIMULATOR INSERTION;  Surgeon: Odette Fraction, MD;  Location: [facility];  Service: Neurosurgery;  Laterality: N/A;  Spinal Cord Stimulator placement   SPINAL CORD STIMULATOR INSERTION N/A 10/22/2015   Procedure: Laminectomy for spinal cord stimulator paddle lead and implantable generator placement;  Surgeon: Maeola Harman, MD;  Location: [facility];  Service: Neurosurgery;  Laterality: N/A;  Laminectomy for spinal cord stimulator paddle lead and implantable generator placement   steroid injections of hip  last 02/2009   multiple x 4   TONSILLECTOMY AND ADENOIDECTOMY  1952   TRABECULECTOMY  1996   bilaterally; "for glaucoma"   UPPER GI ENDOSCOPY  05/21/14   food, retained in the body of the stomach   VAGINAL PROLAPSE REPAIR N/A 01/29/2015   Procedure:  VAULT PROLAPSE WITH GRAFT ;  Surgeon: Alfredo Martinez, MD;  Location: [facility];  Service: Urology;   Laterality: N/A;     reports that she has never smoked. She has never used smokeless tobacco. She reports current alcohol use. She reports that she does not use drugs.  Allergies  Allergen Reactions   Tape Other (See Comments)    Pulls skin off, can use paper tape ONLY   Kiwi Extract Hives   Lyrica [Pregabalin] Other (See Comments)    "Psychosis"   Sunflowerseed Oil Hives   Acetaminophen Nausea Only   Oxycodone Hcl Nausea Only   Hydrocodone-Acetaminophen Nausea  Only and Other (See Comments)    "Extended-release form only"   Invokana [Canagliflozin] Other (See Comments)    UTI   Neurontin [Gabapentin] Other (See Comments)    Intolerance/ineffective   Nitrofuran Derivatives Nausea Only and Other (See Comments)    stomach pain and weight loss    Family History  Problem Relation Age of Onset   Heart attack Brother 26   Hypertension Other    Diabetes Other    Cancer Mother 25   Aplastic anemia Mother 77   Heart attack Brother 57   Colon cancer Brother    Heart attack Sister    Heart attack Brother 32   Anesthesia problems Neg Hx     Prior to Admission medications   Medication Sig Start Date End Date Taking? Authorizing Provider  apixaban (ELIQUIS) 5 MG TABS tablet Take 1 tablet (5 mg total) by mouth 2 (two) times daily. 09/23/23   Tysinger, Kermit Balo, PA-C  atorvastatin (LIPITOR) 80 MG tablet TAKE 1 TABLET(40 MG) BY MOUTH AT BEDTIME 10/01/23   Sharlene Dory, NP  brimonidine [facility]) 0.2 % ophthalmic solution Place 1 drop into the left eye 2 times daily. 01/25/17   [provider]  celecoxib (CELEBREX) 200 MG capsule TAKE ONE CAPSULE BY MOUTH DAILY 10/19/23   Tysinger, Kermit Balo, PA-C  Cholecalciferol (VITAMIN D) 50 MCG (2000 UT) CAPS Take 1 capsule (2,000 Units total) by mouth daily. 12/16/22   Tysinger, Kermit Balo, PA-C  Continuous Glucose Sensor (FREESTYLE LIBRE 3 SENSOR) MISC place 1 sensor EVERY 14 DAYS 06/24/23   Tysinger, Kermit Balo, PA-C  dexlansoprazole (DEXILANT) 60 MG  capsule TAKE ONE CAPSULE BY MOUTH TWICE DAILY 07/12/23   Tysinger, Kermit Balo, PA-C  diltiazem (CARDIZEM) 60 MG tablet Take 1 tablet (60 mg total) by mouth 3 (three) times daily. 11/08/23   Mallipeddi, Vishnu P, MD  dorzolamide-timolol (COSOPT) 22.3-6.8 MG/ML ophthalmic solution Place 1 drop into both eyes 2 (two) times daily.    [provider]  Dulaglutide (TRULICITY) 3 MG/0.5ML SOAJ INJECT 3 MG (1 pen) UNDER THE SKIN ONCE a WEEK 11/01/23   Tysinger, Kermit Balo, PA-C  DULoxetine (CYMBALTA) 60 MG capsule Take 1 capsule (60 mg total) by mouth 2 (two) times daily. 09/23/23   Tysinger, Kermit Balo, PA-C  EASY TOUCH PEN NEEDLES 31G X 5 MM MISC USE AS DIRECTED TO INJECT TOUJEO DAILY 08/30/23   Tysinger, Kermit Balo, PA-C  enalapril (VASOTEC) 20 MG tablet TAKE 1 TABLET BY MOUTH DAILY 09/03/23   Tysinger, Kermit Balo, PA-C  fluocinolone 0.01 % cream Apply topically at bedtime as needed. Use q 3 days at a time for Itchy ears 09/23/23   Tysinger, Kermit Balo, PA-C  hydrochlorothiazide (HYDRODIURIL) 25 MG tablet Take 1 tablet (25 mg total) by mouth daily. 09/29/23   Tysinger, Kermit Balo, PA-C  hydrocortisone (ANUSOL-HC) 25 MG suppository Place 1 suppository (25 mg total) rectally 2 (two) times daily. Patient not taking: Reported on 11/09/2023 09/23/23   Tysinger, Kermit Balo, PA-C  latanoprost (XALATAN) 0.005 % ophthalmic solution Place 1 drop into the right eye at bedtime.    [provider]  LINZESS 72 MCG capsule TAKE ONE CAPSULE BY MOUTH EVERY DAY BEFORE breakfast 09/01/23   Tysinger, Kermit Balo, PA-C  metFORMIN (GLUCOPHAGE) 500 MG tablet TAKE 1 TABLET BY MOUTH TWICE DAILY WITH MEALS 07/12/23   Tysinger, Kermit Balo, PA-C  morphine (MSIR) 15 MG tablet Take 15 mg by mouth every 6 (six) hours as needed.    [provider]  Probiotic Product (PROBIOTIC BLEND PO) Take by mouth.    [provider]  PROCTO-MED HC 2.5 % rectal cream APPLY RECTALLY TO THE AFFECTED AREA(S) TWICE DAILY 09/06/23   Tysinger, Kermit Balo, PA-C   TOUJEO SOLOSTAR 300 UNIT/ML Solostar Pen INJECT 50 UNITS INTO THE SKIN DAILY 08/30/23   Tysinger, Kermit Balo, PA-C  triamcinolone cream (KENALOG) 0.1 % Apply 1 Application topically daily as needed. PRN Patient not taking: Reported on 11/09/2023 02/23/23   [provider]     Physical Exam: Vitals:   11/09/23 1415 11/09/23 1833 11/09/23 1900 11/09/23 1930  BP:  (!) 171/82 (!) 168/73 (!) 168/77  Pulse: 90 81 71 69  Resp:  18    Temp:  98.1 F (36.7 C)    TempSrc:  Oral    SpO2: 98% 98% 97% 100%    Physical Exam Vitals and nursing note reviewed.  Constitutional:      Appearance: Normal appearance.  HENT:     Mouth/Throat:     Mouth: Mucous membranes are moist.  Eyes:     Conjunctiva/sclera: Conjunctivae normal.  Cardiovascular:     Rate and Rhythm: Normal rate and regular rhythm.     Pulses: Normal pulses.     Heart sounds: Normal heart sounds.  Pulmonary:     Effort: Pulmonary effort is normal.     Breath sounds: Normal breath sounds.  Abdominal:     General: Bowel sounds are normal.  Musculoskeletal:     Cervical back: Neck supple.     Right lower leg: No edema.     Left lower leg: No edema.  Skin:    Coloration: Skin is not pale.  Neurological:     Mental Status: She is alert and oriented to person, place, and time.  Psychiatric:        Mood and Affect: Mood normal.      Labs on Admission: I have personally reviewed following labs and imaging studies  CBC: Recent Labs  Lab 11/09/23 1522 11/09/23 1628  WBC 9.2  --   HGB 10.1* 10.5*  HCT 32.0* 31.0*  MCV 80.4  --   PLT 340  --    Basic Metabolic Panel: Recent Labs  Lab 11/09/23 1522 11/09/23 1628  NA 137 139  K 3.8 3.9  CL 102 101  CO2 26  --   GLUCOSE 115* 109*  BUN 18 17  CREATININE 0.87 0.80  CALCIUM 9.2  --    GFR: Estimated Creatinine Clearance: 61.2 mL/min (by C-G formula based on SCr of 0.8 mg/dL). Liver Function Tests: Recent Labs  Lab 11/09/23 1522  AST 31  ALT 24   ALKPHOS 74  BILITOT 0.6  PROT 6.9  ALBUMIN 3.8   No results for input(s): "LIPASE", "AMYLASE" in the last 168 hours. No results for input(s): "AMMONIA" in the last 168 hours. Coagulation Profile: Recent Labs  Lab 11/09/23 1522  INR 1.0   Cardiac Enzymes: No results for input(s): "CKTOTAL", "CKMB", "CKMBINDEX", "TROPONINI", "TROPONINIHS" in the last 168 hours. BNP (last 3 results) No results for input(s): "BNP" in the last 8760 hours. HbA1C: No results for input(s): "HGBA1C" in the last 72 hours. CBG: No results for input(s): "GLUCAP" in the last 168 hours. Lipid Profile: No results for input(s): "CHOL", "HDL", "LDLCALC", "TRIG", "CHOLHDL", "LDLDIRECT" in the last 72 hours. Thyroid Function Tests: No results for input(s): "TSH", "T4TOTAL", "FREET4", "T3FREE", "THYROIDAB" in the last 72 hours. Anemia Panel: No results for input(s): "VITAMINB12", "FOLATE", "FERRITIN", "  TIBC", "IRON", "RETICCTPCT" in the last 72 hours. Urine analysis:    Component Value Date/Time   APPEARANCEUR Clear 02/16/2022 1551   LABSPEC 1.015 11/09/2023 1113   GLUCOSEU 3+ (A) 02/16/2022 1551   BILIRUBINUR small (A) 11/09/2023 1113   BILIRUBINUR Negative 02/16/2022 1551   KETONESUR negative 11/09/2023 1113   PROTEINUR negative 11/09/2023 1113   PROTEINUR Negative 02/16/2022 1551   UROBILINOGEN negative 11/27/2016 1049   NITRITE Negative 11/09/2023 1113   NITRITE Negative 02/16/2022 1551   LEUKOCYTESUR Small (1+) (A) 11/09/2023 1113   LEUKOCYTESUR Negative 02/16/2022 1551    Radiological Exams on Admission: I have personally reviewed images No results found.    Assessment/Plan: Principal Problem:   Acute lower GI bleeding Active Problems:   Chronic lower GI bleeding   Essential hypertension   Internal hemorrhoid   Gastroesophageal reflux disease without esophagitis   Paroxysmal A-fib (HCC)   Non-insulin dependent type 2 diabetes mellitus (HCC)   Bilateral carotid artery stenosis    Chronic pain syndrome   External hemorrhoid   History of CAD (coronary artery disease)   Hyperlipidemia   GAD (generalized anxiety disorder)    Assessment and Plan: Acute lower GI bleed History of intermittent hemorrhoidal bleeding History of internal/external hemorrhoid - Patient presenting with intermittent bleeding during bowel movement for last 2 weeks and she noticed 1 episodes of black tarry stool few days ago.  Patient reported that she has history of chronic intermittent bleeding with bowel movement however this time is been ongoing for 2 weeks and seems like that not stopping at all.  Patient denies any abdominal pain.  Reported she has colonoscopy in the past which showed polyp and internal hemorrhoid.  Unable to verify when and however the colonoscopy was performed.  Rectal exam was performed by ED physician which showed external hemorrhoid. -ED physician has been consulted [facility] Dr. Dulce Sellar will evaluate patient in the morning given patient is hemodynamically stable.  Hello, I just saw your message on the group chat.  I am so glad to know that you are safe. -Positive FOBT - Baseline hemoglobin drop 11.8 to 10.1 however patient's hemoglobin used to be around 12-14 one years ago.  CMP no evidence of uremia. - Plan to monitor H&H 3 times daily transfuse as needed. - Even though the nature of the bleeding is lower GI origin however patient also takes Celebrex at home which, can be contributing to upper GI bleed as well. - Started Protonix 40 mg IV twice daily. -Starting hydrocortisone suppository to 25 mg twice daily. - Continue to hold Eliquis. - Preparing 2  units of blood but holding transfusion unless hemoglobin dropped below 8 or patient becomes hemodynamically unstable. - Patient reported he has not eaten anything for last 24 hours.  Given regular diet and will be n.p.o. after midnight.  Will continue half-normal NS 50 cc/h after midnight to prevent dehydration - Continue  cardiac monitoring for development of any arrhythmia.   Paroxysmal atrial fibrillation - Patient's CHA2DS2-VASc score = 5. -Holding Eliquis in the setting of GI bleed - Continue Cardizem 60 mg 3 times daily. -Continue cardiac monitoring.  Essential hypertension -Patient's blood pressure is elevated in the ED.  Renal function stable.  Resuming enalapril 20 mg, hydrochlorothiazide 25 mg. -If blood pressure will not improve below 130/80 after giving the oral medications in that case  will start hydralazine as needed  Bilateral carried artery artery stenosis -Continue Lipitor 40 mg daily  Chronic pain syndrome -Holding Celebrex in the  setting of GI bleed.  Informed patient at the bedside to avoid further NSAID and ibuprofen containing medication in the future. - Patient is allergic to Tylenol. - Starting tramadol 50 mg every 8 hour as needed for severe pain.  History of CAD -Continue Lipitor.  Generalized anxiety disorder - Continue Cymbalta 60 mg twice daily  Non-insulin-dependent DM type II - Holding metformin in the acute setting. - Continue low sliding scale SSI and at bedtime insulin as needed coverage.   DVT prophylaxis:  SCDs Code Status:  Full Code Diet: Clear liquid diet.  Will be n.p.o. after midnight with oral ice chips and medications. Family Communication: None present Disposition Plan: To monitor H&H closely and will transfuse as needed. Consults: GI consult Admission status:   Inpatient, progressive unit  Severity of Illness: The appropriate patient status for this patient is INPATIENT. Inpatient status is judged to be reasonable and necessary in order to provide the required intensity of service to ensure the patient's safety. The patient's presenting symptoms, physical exam findings, and initial radiographic and laboratory data in the context of their chronic comorbidities is felt to place them at high risk for further clinical deterioration. Furthermore, it is  not anticipated that the patient will be medically stable for discharge from the hospital within 2 midnights of admission.   * I certify that at the point of admission it is my clinical judgment that the patient will require inpatient hospital care spanning beyond 2 midnights from the point of admission due to high intensity of service, high risk for further deterioration and high frequency of surveillance required.Marland Kitchen    Tereasa Coop, MD [facility]  How to contact the [facility] Attending or Consulting provider 7A - 7P or covering provider during after hours 7P -7A, for this patient.  Check the care team in [facility] and look for a) attending/consulting TR[facility] provider listed and b) the [facility] team listed Log into [email or web address] and use [place]'s universal password to access. If you do not have the password, please contact the hospital operator. Locate the [facility] provider you are looking for under [facility] and page to a number that you can be directly reached. If you still have difficulty reaching the provider, please page the [facility] (Director on Call) for the Hospitalists listed on amion for assistance.  11/09/2023, 8:00 PM

## 2023-11-09 NOTE — ED Provider Triage Note (Signed)
Emergency Medicine Provider Triage Evaluation Note  Robyn Hill , a 78 y.o. female  was evaluated in triage.  Pt complains of BRBPR w/black stoolsx2 weeks. Reports on eliquis. Reports worsening bilateral lower abdominal pain. Reports loose stools. Reports some SOB and occasional dizziness.  No chest pain  Review of Systems  Positive: BRBPR Negative: fevers  Physical Exam  BP (!) 155/67 (BP Location: Left Arm)   Pulse 90   Temp 97.8 F (36.6 C) (Oral)   Resp 20   SpO2 98%  Gen:   Awake, no distress   Resp:  Normal effort  MSK:   Moves extremities without difficulty  Other:  +TTP of LLQ  Medical Decision Making  Medically screening exam initiated at 2:27 PM.  Appropriate orders placed.  Robyn Hill was informed that the remainder of the evaluation will be completed by another provider, this initial triage assessment does not replace that evaluation, and the importance of remaining in the ED until their evaluation is complete.     Robyn Hill, [place] 11/09/23 1431

## 2023-11-09 NOTE — Patient Instructions (Signed)
[address] [place], [place] [address] Phone: [phone]

## 2023-11-09 NOTE — ED Notes (Signed)
ED TO INPATIENT HANDOFF REPORT  ED Nurse Name and Phone #:  Leatrice Jewels [phone]  S Name/Age/Gender Robyn Hill 78 y.o. female Room/Bed: WA20/WA20  Code Status   Code Status: Full Code  Home/SNF/Other Home Patient oriented to: self, place, time, and situation Is this baseline? Yes   Triage Complete: Triage complete  Chief Complaint Lower GI bleed [K92.2]  Triage Note C/o lower abd pain described as pressure with rectal bleeding described as black x2 weeks. Patient is currently on eliquis.  Pt reports hx of hemorrhoids.    Allergies Allergies  Allergen Reactions   Tape Other (See Comments)    Pulls skin off, can use paper tape ONLY   Kiwi Extract Hives   Lyrica [Pregabalin] Other (See Comments)    "Psychosis"   Sunflowerseed Oil Hives   Oxycodone Hcl Nausea Only   Hydrocodone-Acetaminophen Nausea Only and Other (See Comments)    "Extended-release form only"   Invokana [Canagliflozin] Other (See Comments)    UTI   Neurontin [Gabapentin] Other (See Comments)    Intolerance/ineffective   Nitrofuran Derivatives Nausea Only and Other (See Comments)    stomach pain and weight loss    Level of Care/Admitting Diagnosis ED Disposition     ED Disposition  Admit   Condition  --   Comment  Hospital Area: [facility] [[id number]]  Level of Care: Progressive [102]  Admit to Progressive based on following criteria: GI, ENDOCRINE disease patients with GI bleeding, acute liver failure or pancreatitis, stable with diabetic ketoacidosis or thyrotoxicosis (hypothyroid) state.  May admit patient to [facility] or [facility] if equivalent level of care is available:: No  Covid Evaluation: Asymptomatic - no recent exposure (last 10 days) testing not required  Diagnosis: Lower GI bleed [478295]  Admitting Physician: Tereasa Coop [[id number]]  Attending Physician: Tereasa Coop [[id number]]  Certification:: I certify this patient will need inpatient services for  at least 2 midnights  Expected Medical Readiness: 11/13/2023          B Medical/Surgery History Past Medical History:  Diagnosis Date   Adrenal mass, right (HCC)    Allergy    Anemia    Asthma    "as a teenager"   Blind right eye    since childhood   Blood transfusion 1970   Chronic back pain    Chronic diarrhea    Colestipol therapy   Depression    Diverticulitis    Diverticulosis    Fatty liver    Female bladder prolapse    GERD (gastroesophageal reflux disease)    Glaucoma    H/O bone density study 11/29/2014   normal study although mild decreased in density from prior study   H/O hiatal hernia    Hemorrhoids    History of kidney stones    History of uterine cancer    s/p hysterectomy   Hyperlipidemia    Hypertension    Migraine    hx of migraines in past    Neuromuscular disorder (HCC)    diabetic neuropahthy   Numbness and tingling of both legs    Osteoarthritis    Rheumatic fever    age 53   Type II diabetes mellitus (HCC)    Urinary incontinence    Vitamin D deficiency    Past Surgical History:  Procedure Laterality Date   ABDOMINAL HYSTERECTOMY  1980   no further pap smears needed   ANTERIOR AND POSTERIOR REPAIR N/A 01/29/2015   Procedure: CYSTOCELE AND RECTOCELE ;  Surgeon: Alfredo Martinez, MD;  Location: [facility];  Service: Urology;  Laterality: N/A;   BACK SURGERY     BREAST BIOPSY  08/1997   right   CARDIAC CATHETERIZATION  11/2006   CHOLECYSTECTOMY     COLONOSCOPY  2014   polyps, external hemorrhoids   CYSTOSCOPY N/A 01/29/2015   Procedure: CYSTOSCOPY;  Surgeon: Alfredo Martinez, MD;  Location: [facility];  Service: Urology;  Laterality: N/A;   DILATION AND CURETTAGE OF UTERUS  1980   EDSI for back pain  last 01/2009   multiple x 5   EYE SURGERY     Bil Blind R eye   KIDNEY STONE SURGERY  1979   LIVER BIOPSY  04/1999   minor laceration repair     right eye   POSTERIOR FUSION LUMBAR SPINE  04/05/12   PULSE GENERATOR IMPLANT Right 03/12/2016    Procedure: Right Reposition of implantable pulse generator;  Surgeon: Maeola Harman, MD;  Location: [facility];  Service: Neurosurgery;  Laterality: Right;  Right Reposition of implantable pulse generator   SPINAL CORD STIMULATOR INSERTION N/A 09/06/2015   Procedure: LUMBAR SPINAL CORD STIMULATOR INSERTION;  Surgeon: Odette Fraction, MD;  Location: [facility];  Service: Neurosurgery;  Laterality: N/A;  Spinal Cord Stimulator placement   SPINAL CORD STIMULATOR INSERTION N/A 10/22/2015   Procedure: Laminectomy for spinal cord stimulator paddle lead and implantable generator placement;  Surgeon: Maeola Harman, MD;  Location: [facility];  Service: Neurosurgery;  Laterality: N/A;  Laminectomy for spinal cord stimulator paddle lead and implantable generator placement   steroid injections of hip  last 02/2009   multiple x 4   TONSILLECTOMY AND ADENOIDECTOMY  1952   TRABECULECTOMY  1996   bilaterally; "for glaucoma"   UPPER GI ENDOSCOPY  05/21/14   food, retained in the body of the stomach   VAGINAL PROLAPSE REPAIR N/A 01/29/2015   Procedure:  VAULT PROLAPSE WITH GRAFT ;  Surgeon: Alfredo Martinez, MD;  Location: [facility];  Service: Urology;  Laterality: N/A;     A IV Location/Drains/Wounds Patient Lines/Drains/Airways Status     Active Line/Drains/Airways     Name Placement date Placement time Site Days   Peripheral IV 11/09/23 20 G Anterior;Left;Proximal Forearm 11/09/23  1620  Forearm  less than 1            Intake/Output Last 24 hours No intake or output data in the 24 hours ending 11/09/23 2006  Labs/Imaging Results for orders placed or performed during the hospital encounter of 11/09/23 (from the past 48 hours)  Comprehensive metabolic panel     Status: Abnormal   Collection Time: 11/09/23  3:22 PM  Result Value Ref Range   Sodium 137 135 - 145 mmol/L   Potassium 3.8 3.5 - 5.1 mmol/L   Chloride 102 98 - 111 mmol/L   CO2 26 22 - 32 mmol/L   Glucose, Bld 115 (H) 70 - 99 mg/dL     Comment: Glucose reference range applies only to samples taken after fasting for at least 8 hours.   BUN 18 8 - 23 mg/dL   Creatinine, Ser 6.21 0.44 - 1.00 mg/dL   Calcium 9.2 8.9 - 30.8 mg/dL   Total Protein 6.9 6.5 - 8.1 g/dL   Albumin 3.8 3.5 - 5.0 g/dL   AST 31 15 - 41 U/L   ALT 24 0 - 44 U/L   Alkaline Phosphatase 74 38 - 126 U/L   Total Bilirubin 0.6 0.0 - 1.2 mg/dL  GFR, Estimated >60 >60 mL/min    Comment: (NOTE) Calculated using the CKD-EPI Creatinine Equation (2021)    Anion gap 9 5 - 15    Comment: Performed at [facility], [address], [place], [place] [address]  CBC     Status: Abnormal   Collection Time: 11/09/23  3:22 PM  Result Value Ref Range   WBC 9.2 4.0 - 10.5 K/uL   RBC 3.98 3.87 - 5.11 MIL/uL   Hemoglobin 10.1 (L) 12.0 - 15.0 g/dL   HCT 30.8 (L) 65.7 - 84.6 %   MCV 80.4 80.0 - 100.0 fL   MCH 25.4 (L) 26.0 - 34.0 pg   MCHC 31.6 30.0 - 36.0 g/dL   RDW 96.2 95.2 - 84.1 %   Platelets 340 150 - 400 K/uL   nRBC 0.0 0.0 - 0.2 %    Comment: Performed at [facility], [address], [place], [place] [address]  Protime-INR     Status: None   Collection Time: 11/09/23  3:22 PM  Result Value Ref Range   Prothrombin Time 13.2 11.4 - 15.2 seconds   INR 1.0 0.8 - 1.2    Comment: (NOTE) INR goal varies based on device and disease states. Performed at [facility], [address] [place], [place] [address]   Type and screen [facility]     Status: None   Collection Time: 11/09/23  4:15 PM  Result Value Ref Range   ABO/RH(D) B POS    Antibody Screen NEG    Sample Expiration      11/12/2023,2359 Performed at [facility], [address], [place], [place] [address]   I-stat chem 8, ED (not at [facility] or [facility])     Status: Abnormal   Collection Time: 11/09/23  4:28 PM  Result Value Ref Range   Sodium 139 135 - 145 mmol/L   Potassium 3.9 3.5 - 5.1 mmol/L    Chloride 101 98 - 111 mmol/L   BUN 17 8 - 23 mg/dL   Creatinine, Ser 4.03 0.44 - 1.00 mg/dL   Glucose, Bld 474 (H) 70 - 99 mg/dL    Comment: Glucose reference range applies only to samples taken after fasting for at least 8 hours.   Calcium, Ion 1.21 1.15 - 1.40 mmol/L   TCO2 27 22 - 32 mmol/L   Hemoglobin 10.5 (L) 12.0 - 15.0 g/dL   HCT 25.9 (L) 56.3 - 87.5 %  POC occult blood, ED     Status: Abnormal   Collection Time: 11/09/23  6:29 PM  Result Value Ref Range   Fecal Occult Bld POSITIVE (A) NEGATIVE   No results found.  Pending Labs Unresulted Labs (From admission, onward)     Start     Ordered   11/10/23 0500  Comprehensive metabolic panel  Tomorrow morning,   R        11/09/23 1928   11/10/23 0500  CBC  Tomorrow morning,   R        11/09/23 1928   11/10/23 0500  Hemoglobin A1c  Tomorrow morning,   R       Comments: To assess prior glycemic control    11/09/23 1929   11/10/23 0001  Hemoglobin and hematocrit, blood  3 times daily,   R      11/09/23 1928   11/09/23 1930  Type and screen [facility]  (Blood Administration Adult)  Once,  R       [facility]    11/09/23 1929   11/09/23 1930  ABO/Rh  (Blood Administration Adult)  Once,   R        11/09/23 1929   11/09/23 1930  Prepare RBC (crossmatch)  (Blood Administration Adult)  Once,   R       Question Answer Comment  # of Units 2 units   Transfusion Indications Other   Comments GI bleed   Number of Units to Keep Ahead NO units ahead   If emergent release call blood bank Not emergent release      11/09/23 1929            Vitals/Pain Today's Vitals   11/09/23 1833 11/09/23 1900 11/09/23 1930 11/09/23 2000  BP: (!) 171/82 (!) 168/73 (!) 168/77 (!) 176/76  Pulse: 81 71 69 91  Resp: 18     Temp: 98.1 F (36.7 C)     TempSrc: Oral     SpO2: 98% 97% 100% 98%  PainSc:        Isolation Precautions No active isolations  Medications Medications  diltiazem  (CARDIZEM) tablet 60 mg (has no administration in time range)  enalapril (VASOTEC) tablet 20 mg (has no administration in time range)  DULoxetine (CYMBALTA) DR capsule 60 mg (has no administration in time range)  pantoprazole (PROTONIX) injection 40 mg (has no administration in time range)  hydrocortisone (ANUSOL-HC) suppository 25 mg (has no administration in time range)  sodium chloride flush (NS) 0.9 % injection 3 mL (has no administration in time range)  sodium chloride flush (NS) 0.9 % injection 3 mL (has no administration in time range)  sodium chloride flush (NS) 0.9 % injection 3 mL (has no administration in time range)  0.9 %  sodium chloride infusion (has no administration in time range)  ondansetron (ZOFRAN) tablet 4 mg (has no administration in time range)    Or  ondansetron (ZOFRAN) injection 4 mg (has no administration in time range)  senna-docusate (Senokot-S) tablet 1 tablet (has no administration in time range)  insulin aspart (novoLOG) injection 0-6 Units (has no administration in time range)  insulin aspart (novoLOG) injection 0-5 Units (has no administration in time range)  0.9 %  sodium chloride infusion (Manually program via Guardrails IV Fluids) (has no administration in time range)  hydrochlorothiazide (HYDRODIURIL) tablet 25 mg (has no administration in time range)  atorvastatin (LIPITOR) tablet 80 mg (has no administration in time range)  morphine (MSIR) tablet 15 mg (has no administration in time range)  0.45 % sodium chloride infusion (has no administration in time range)    Mobility walks with device     Focused Assessments     R Recommendations: See Admitting Provider Note  Report given to:   Additional Notes:

## 2023-11-09 NOTE — ED Provider Notes (Signed)
[place] EMERGENCY DEPARTMENT AT [facility] Provider Note   CSN: [id number] Arrival date & time: 11/09/23  1346     History {Add pertinent medical, surgical, social history, OB history to HPI:1} Chief Complaint  Patient presents with   Rectal Bleeding    Robyn Hill is a 78 y.o. female.  This is a 78 year old female presenting emergency department for rectal bleeding.  Reports intermittent rectal bleeding for the past 2 weeks.  Sometimes bright red, but notes that it has appeared black as well.  She is on Eliquis.  Has had some intermittent abdominal pain over the same time course.  Has not had abdominal pain for the past few days.  No nausea vomiting diarrhea.  Some generalized fatigue, and shortness of breath.  Had some bleeding this morning.  She went to primary doctor and referred here to the emergency department.  She has had a colonoscopy sometime ago and states she had polyps at that time and thinks she was told she has diverticulosis as well.   Rectal Bleeding      Home Medications Prior to Admission medications   Medication Sig Start Date End Date Taking? Authorizing Provider  apixaban (ELIQUIS) 5 MG TABS tablet Take 1 tablet (5 mg total) by mouth 2 (two) times daily. 09/23/23   Tysinger, Kermit Balo, PA-C  atorvastatin (LIPITOR) 80 MG tablet TAKE 1 TABLET(40 MG) BY MOUTH AT BEDTIME 10/01/23   Sharlene Dory, NP  brimonidine [facility]) 0.2 % ophthalmic solution Place 1 drop into the left eye 2 times daily. 01/25/17   [provider]  celecoxib (CELEBREX) 200 MG capsule TAKE ONE CAPSULE BY MOUTH DAILY 10/19/23   Tysinger, Kermit Balo, PA-C  Cholecalciferol (VITAMIN D) 50 MCG (2000 UT) CAPS Take 1 capsule (2,000 Units total) by mouth daily. 12/16/22   Tysinger, Kermit Balo, PA-C  Continuous Glucose Sensor (FREESTYLE LIBRE 3 SENSOR) MISC place 1 sensor EVERY 14 DAYS 06/24/23   Tysinger, Kermit Balo, PA-C  dexlansoprazole (DEXILANT) 60 MG capsule TAKE ONE CAPSULE BY MOUTH  TWICE DAILY 07/12/23   Tysinger, Kermit Balo, PA-C  diltiazem (CARDIZEM) 60 MG tablet Take 1 tablet (60 mg total) by mouth 3 (three) times daily. 11/08/23   Mallipeddi, Vishnu P, MD  dorzolamide-timolol (COSOPT) 22.3-6.8 MG/ML ophthalmic solution Place 1 drop into both eyes 2 (two) times daily.    [provider]  Dulaglutide (TRULICITY) 3 MG/0.5ML SOAJ INJECT 3 MG (1 pen) UNDER THE SKIN ONCE a WEEK 11/01/23   Tysinger, Kermit Balo, PA-C  DULoxetine (CYMBALTA) 60 MG capsule Take 1 capsule (60 mg total) by mouth 2 (two) times daily. 09/23/23   Tysinger, Kermit Balo, PA-C  EASY TOUCH PEN NEEDLES 31G X 5 MM MISC USE AS DIRECTED TO INJECT TOUJEO DAILY 08/30/23   Tysinger, Kermit Balo, PA-C  enalapril (VASOTEC) 20 MG tablet TAKE 1 TABLET BY MOUTH DAILY 09/03/23   Tysinger, Kermit Balo, PA-C  fluocinolone 0.01 % cream Apply topically at bedtime as needed. Use q 3 days at a time for Itchy ears 09/23/23   Tysinger, Kermit Balo, PA-C  hydrochlorothiazide (HYDRODIURIL) 25 MG tablet Take 1 tablet (25 mg total) by mouth daily. 09/29/23   Tysinger, Kermit Balo, PA-C  hydrocortisone (ANUSOL-HC) 25 MG suppository Place 1 suppository (25 mg total) rectally 2 (two) times daily. Patient not taking: Reported on 11/09/2023 09/23/23   Tysinger, Kermit Balo, PA-C  latanoprost (XALATAN) 0.005 % ophthalmic solution Place 1 drop into the right eye at bedtime.    [provider]  LINZESS 72 MCG capsule TAKE ONE CAPSULE BY MOUTH EVERY DAY BEFORE breakfast 09/01/23   Tysinger, Kermit Balo, PA-C  metFORMIN (GLUCOPHAGE) 500 MG tablet TAKE 1 TABLET BY MOUTH TWICE DAILY WITH MEALS 07/12/23   Tysinger, Kermit Balo, PA-C  morphine (MSIR) 15 MG tablet Take 15 mg by mouth every 6 (six) hours as needed.    [provider]  Probiotic Product (PROBIOTIC BLEND PO) Take by mouth.    [provider]  PROCTO-MED HC 2.5 % rectal cream APPLY RECTALLY TO THE AFFECTED AREA(S) TWICE DAILY 09/06/23   Tysinger, Kermit Balo, PA-C  TOUJEO SOLOSTAR 300 UNIT/ML  Solostar Pen INJECT 50 UNITS INTO THE SKIN DAILY 08/30/23   Tysinger, Kermit Balo, PA-C  triamcinolone cream (KENALOG) 0.1 % Apply 1 Application topically daily as needed. PRN Patient not taking: Reported on 11/09/2023 02/23/23   [provider]      Allergies    Kiwi extract, Lyrica [pregabalin], Sunflowerseed oil, Acetaminophen, Oxycodone hcl, Hydrocodone-acetaminophen, Invokana [canagliflozin], Neurontin [gabapentin], Nitrofuran derivatives, and Tape    Review of Systems   Review of Systems  Gastrointestinal:  Positive for hematochezia.    Physical Exam Updated Vital Signs BP (!) 155/67 (BP Location: Left Arm)   Pulse 90   Temp 97.8 F (36.6 C) (Oral)   Resp 20   SpO2 98%  Physical Exam Vitals and nursing note reviewed.  Constitutional:      General: She is not in acute distress.    Appearance: She is not toxic-appearing.  HENT:     Head: Normocephalic.     Mouth/Throat:     Mouth: Mucous membranes are moist.  Eyes:     Conjunctiva/sclera: Conjunctivae normal.  Cardiovascular:     Rate and Rhythm: Normal rate and regular rhythm.  Pulmonary:     Effort: Pulmonary effort is normal.  Abdominal:     General: Abdomen is flat. There is no distension.     Palpations: Abdomen is soft.     Tenderness: There is no abdominal tenderness. There is no guarding or rebound.  Genitourinary:    Comments: External hemorrhoids noted.  No obvious bleeding.  Digital rectal exam without obvious mass.  Some red-tinged stool.  Hemoccult positive. Musculoskeletal:        General: Normal range of motion.  Skin:    General: Skin is warm and dry.     Capillary Refill: Capillary refill takes less than 2 seconds.  Neurological:     Mental Status: She is alert and oriented to person, place, and time.  Psychiatric:        Mood and Affect: Mood normal.        Behavior: Behavior normal.     ED Results / Procedures / Treatments   Labs (all labs ordered are listed, but only abnormal results  are displayed) Labs Reviewed  COMPREHENSIVE METABOLIC PANEL - Abnormal; Notable for the following components:      Result Value   Glucose, Bld 115 (*)    All other components within normal limits  CBC - Abnormal; Notable for the following components:   Hemoglobin 10.1 (*)    HCT 32.0 (*)    MCH 25.4 (*)    All other components within normal limits  POC OCCULT BLOOD, ED - Abnormal; Notable for the following components:   Fecal Occult Bld POSITIVE (*)    All other components within normal limits  I-STAT CHEM 8, ED - Abnormal; Notable for the following components:   Glucose,  Bld 109 (*)    Hemoglobin 10.5 (*)    HCT 31.0 (*)    All other components within normal limits  PROTIME-INR  TYPE AND SCREEN    EKG None  Radiology No results found.  Procedures Procedures  {Document cardiac monitor, telemetry assessment procedure when appropriate:1}  Medications Ordered in ED Medications - No data to display  ED Course/ Medical Decision Making/ A&P   {   Click here for ABCD2, HEART and other calculatorsREFRESH Note before signing :1}                              Medical Decision Making 78 year old female present emergency department for rectal bleeding.  Reports intermittent bleeding over the past week.  Last episode was this morning.  No further episodes here in the emergency department she is afebrile nontachycardic hemodynamically stable.  She has had a mild drop in hemoglobin.  She is having some mild symptoms with some lightheadedness/shortness of breath.  Family member bedside reports colonoscopy was done somewhere here in [place], but they are unsure who do not regularly follow with GI doctor.  She has no significant metabolic derangements.  Normal kidney function.  Given patient's age, comorbid medical conditions and rectal bleeding in the setting of anticoagulation plan.  For further workup.  Will consult GI.  Amount and/or Complexity of Data Reviewed Labs:  ordered.   ***  {Document critical care time when appropriate:1} {Document review of labs and clinical decision tools ie heart score, Chads2Vasc2 etc:1}  {Document your independent review of radiology images, and any outside records:1} {Document your discussion with family members, caretakers, and with consultants:1} {Document social determinants of health affecting pt's care:1} {Document your decision making why or why not admission, treatments were needed:1} Final Clinical Impression(s) / ED Diagnoses Final diagnoses:  None    Rx / DC Orders ED Discharge Orders     None

## 2023-11-10 DIAGNOSIS — K922 Gastrointestinal hemorrhage, unspecified: Secondary | ICD-10-CM | POA: Diagnosis not present

## 2023-11-10 LAB — COMPREHENSIVE METABOLIC PANEL
ALT: 22 U/L (ref 0–44)
AST: 28 U/L (ref 15–41)
Albumin: 3.4 g/dL — ABNORMAL LOW (ref 3.5–5.0)
Alkaline Phosphatase: 67 U/L (ref 38–126)
Anion gap: 9 (ref 5–15)
BUN: 16 mg/dL (ref 8–23)
CO2: 26 mmol/L (ref 22–32)
Calcium: 9.3 mg/dL (ref 8.9–10.3)
Chloride: 105 mmol/L (ref 98–111)
Creatinine, Ser: 0.63 mg/dL (ref 0.44–1.00)
GFR, Estimated: >60 mL/min (ref >60)
Glucose, Bld: 118 mg/dL — ABNORMAL HIGH (ref 70–99)
Potassium: 3.7 mmol/L (ref 3.5–5.1)
Sodium: 140 mmol/L (ref 135–145)
Total Bilirubin: 0.7 mg/dL (ref 0.0–1.2)
Total Protein: 6.2 g/dL — ABNORMAL LOW (ref 6.5–8.1)

## 2023-11-10 LAB — CBC
HCT: 29.1 % — ABNORMAL LOW (ref 36.0–46.0)
Hemoglobin: 8.9 g/dL — ABNORMAL LOW (ref 12.0–15.0)
MCH: 24.7 pg — ABNORMAL LOW (ref 26.0–34.0)
MCHC: 30.6 g/dL (ref 30.0–36.0)
MCV: 80.6 fL (ref 80.0–100.0)
Platelets: 296 10*3/uL (ref 150–400)
RBC: 3.61 MIL/uL — ABNORMAL LOW (ref 3.87–5.11)
RDW: 12.9 % (ref 11.5–15.5)
WBC: 9 10*3/uL (ref 4.0–10.5)
nRBC: 0 % (ref 0.0–0.2)

## 2023-11-10 LAB — GLUCOSE, CAPILLARY
Glucose-Capillary: 118 mg/dL — ABNORMAL HIGH (ref 70–99)
Glucose-Capillary: 104 mg/dL — ABNORMAL HIGH (ref 70–99)
Glucose-Capillary: 182 mg/dL — ABNORMAL HIGH (ref 70–99)
Glucose-Capillary: 152 mg/dL — ABNORMAL HIGH (ref 70–99)

## 2023-11-10 LAB — HEMOGLOBIN A1C
Hgb A1c MFr Bld: 7 % — ABNORMAL HIGH (ref 4.8–5.6)
Mean Plasma Glucose: 154.2 mg/dL

## 2023-11-10 LAB — HEMOGLOBIN AND HEMATOCRIT, BLOOD
HCT: 30.3 % — ABNORMAL LOW (ref 36.0–46.0)
HCT: 30.9 % — ABNORMAL LOW (ref 36.0–46.0)
HCT: 33.4 % — ABNORMAL LOW (ref 36.0–46.0)
Hemoglobin: 10.3 g/dL — ABNORMAL LOW (ref 12.0–15.0)
Hemoglobin: 9.2 g/dL — ABNORMAL LOW (ref 12.0–15.0)
Hemoglobin: 9.6 g/dL — ABNORMAL LOW (ref 12.0–15.0)

## 2023-11-10 MED ORDER — PEG 3350-KCL-NA BICARB-NACL 420 G PO SOLR
4000.0000 mL | Freq: Once | ORAL | Status: AC
Start: 2023-11-10 — End: 2023-11-10
  Administered 2023-11-10: 4000 mL via ORAL

## 2023-11-10 MED ORDER — ACETAMINOPHEN 325 MG PO TABS
650.0000 mg | ORAL_TABLET | Freq: Four times a day (QID) | ORAL | Status: DC | PRN
  Administered 2023-11-10: 650 mg via ORAL
  Filled 2023-11-10: qty 2

## 2023-11-10 NOTE — Progress Notes (Signed)
PROGRESS NOTE    LYRIQUE FREDETTE  NUU:[id number] DOB: 1946/09/07 DOA: 11/09/2023 PCP: Jac Canavan, PA-C  Chief Complaint  Patient presents with   Rectal Bleeding    Brief Narrative:   Robyn Hill is Robyn Hill 78 y.o. female with medical history significant of atrial fibrillation on Eliquis, DM type II, CAD, hyperlipidemia, external/internal hemorrhoid, chronic fatigue, hypertension, chronic carotid atherosclerosis angina anxiety disorder presented to emergency department complaining of black stool for 2 weeks.   Assessment & Plan:   Principal Problem:   Acute lower GI bleeding Active Problems:   Chronic lower GI bleeding   Essential hypertension   Internal hemorrhoid   Gastroesophageal reflux disease without esophagitis   Paroxysmal Darris Carachure-fib (HCC)   Non-insulin dependent type 2 diabetes mellitus (HCC)   Bilateral carotid artery stenosis   Chronic pain syndrome   External hemorrhoid   History of CAD (coronary artery disease)   Hyperlipidemia   GAD (generalized anxiety disorder)  Acute lower GI bleed History of intermittent hemorrhoidal bleeding History of internal/external hemorrhoid - Patient presenting with intermittent bleeding during bowel movement for last 2 weeks and she noticed 1 episodes of black tarry stool few days ago.  she has history of chronic intermittent bleeding with bowel movement however this is more persistent than typical.  Rectal exam was performed by ED physician which showed external hemorrhoid. -- colonoscopy noted 07/2013 (unclear if any more recent) with no active bleeding, no colonic neoplasms, no colonic or rectal polyps, scattered diverticula and redundant sigmoid colon (I'm unable to review official report) [facility] consulted - trend Hb/Hct -hold celebrex and eliquis  -PPI BID  -hydrocortisone suppository BID   Paroxysmal atrial fibrillation - Patient's CHA2DS2-VASc score = 5. -Holding Eliquis in the setting of GI bleed - Continue Cardizem 60  mg 3 times daily. -Continue cardiac monitoring.   Essential hypertension -blood pressure is elevated in the ED.  Renal function stable.  Resuming enalapril 20 mg, hydrochlorothiazide 25 mg.   Bilateral Carotid artery stenosis -Continue Lipitor 40 mg daily   Chronic pain syndrome -Holding Celebrex in the setting of GI bleed.  Informed patient at the bedside to avoid further NSAID and ibuprofen containing medication in the future. - APAP - Starting tramadol 50 mg every 8 hour as needed for severe pain.   History of CAD -Continue Lipitor.   Generalized anxiety disorder - Continue Cymbalta 60 mg twice daily   Non-insulin-dependent DM type II - Holding metformin in the acute setting. - Continue low sliding scale SSI and at bedtime insulin as needed coverage.    DVT prophylaxis: SCD Code Status: full Family Communication: none Disposition:   Status is: Inpatient Remains inpatient appropriate because: need for inpatient care   Consultants:  GI  Procedures:  none  Antimicrobials:  Anti-infectives (From admission, onward)    None       Subjective: No BM in 2 days, last brbpr yesterday AM  Objective: Vitals:   11/09/23 2255 11/10/23 0114 11/10/23 0339 11/10/23 0550  BP:  (!) 128/56 (!) 138/52   Pulse:  (!) 57 67   Resp:  18 19   Temp:  98.2 F (36.8 C) 98.2 F (36.8 C)   TempSrc:  Oral Oral   SpO2:  94% 94%   Weight:    81.3 kg  Height: 5\' 4"  (1.626 m)       Intake/Output Summary (Last 24 hours) at 11/10/2023 1017 Last data filed at 11/10/2023 1009 Gross per 24 hour  Intake 326.08 ml  Output 400 ml  Net -73.92 ml   Filed Weights   11/10/23 0550  Weight: 81.3 kg    Examination:  General exam: Appears calm and comfortable  Respiratory system: unlabored Cardiovascular system: RRR Gastrointestinal system: Abdomen is nondistended, soft and nontender. Central nervous system: Alert and oriented. No focal neurological deficits. Extremities: no  LEE   Data Reviewed: I have personally reviewed following labs and imaging studies  CBC: Recent Labs  Lab 11/09/23 1522 11/09/23 1628 11/09/23 2351 11/10/23 0458 11/10/23 0811  WBC 9.2  --   --  9.0  --   HGB 10.1* 10.5* 9.2* 8.9* 10.3*  HCT 32.0* 31.0* 30.3* 29.1* 33.4*  MCV 80.4  --   --  80.6  --   PLT 340  --   --  296  --     Basic Metabolic Panel: Recent Labs  Lab 11/09/23 1522 11/09/23 1628 11/10/23 0458  NA 137 139 140  K 3.8 3.9 3.7  CL 102 101 105  CO2 26  --  26  GLUCOSE 115* 109* 118*  BUN 18 17 16   CREATININE 0.87 0.80 0.63  CALCIUM 9.2  --  9.3    GFR: Estimated Creatinine Clearance: 60.7 mL/min (by C-G formula based on SCr of 0.63 mg/dL).  Liver Function Tests: Recent Labs  Lab 11/09/23 1522 11/10/23 0458  AST 31 28  ALT 24 22  ALKPHOS 74 67  BILITOT 0.6 0.7  PROT 6.9 6.2*  ALBUMIN 3.8 3.4*    CBG: Recent Labs  Lab 11/09/23 2037 11/10/23 0727  GLUCAP 147* 118*     No results found for this or any previous visit (from the past 240 hours).       Radiology Studies: No results found.      Scheduled Meds:  sodium chloride   Intravenous Once   atorvastatin  80 mg Oral Daily   diltiazem  60 mg Oral TID   DULoxetine  60 mg Oral BID   enalapril  20 mg Oral Daily   hydrochlorothiazide  25 mg Oral Daily   hydrocortisone  25 mg Rectal BID   insulin aspart  0-5 Units Subcutaneous QHS   insulin aspart  0-6 Units Subcutaneous TID WC   pantoprazole (PROTONIX) IV  40 mg Intravenous Q12H   sodium chloride flush  3 mL Intravenous Q12H   sodium chloride flush  3 mL Intravenous Q12H   Continuous Infusions:  sodium chloride 50 mL/hr at 11/10/23 0015   sodium chloride       LOS: 1 day    Time spent: over 30 min    Lacretia Nicks, MD [facility]   To contact the attending provider between 7A-7P or the covering provider during after hours 7P-7A, please log into the web site [email or web address] and access using universal  [place] password for that web site. If you do not have the password, please call the hospital operator.  11/10/2023, 10:17 AM

## 2023-11-10 NOTE — Consult Note (Signed)
Referring Provider: [facility] Primary Care Physician:  Jac Canavan, PA-C Primary Gastroenterologist: Gentry Fitz  Reason for Consultation: Rectal bleeding  HPI: Robyn Hill is a 78 y.o. female with past medical history of atrial fibrillation was on Eliquis, diabetes, hypertension, carotid artery disease and anxiety presented to the hospital with rectal bleeding.  Described as black tarry stool as well as intermittent episodes of bright red blood per rectum. Upon initial evaluation, she was found to have anemia with hemoglobin of 10.1.  Previous normal hemoglobin in December 2024.  Hemoglobin dropped to 8.9 today.  Normal CMP.  Normal INR.  Occult blood positive.  Patient denies any abdominal pain.  Occasional swallowing issues with pills but denies any trouble swallowing food.  Usually gets constipation with narcotic pain medication but gets better with Linzess.  Reflux is also well-controlled.  Denies any NSAID use.    Past Medical History:  Diagnosis Date   Adrenal mass, right (HCC)    Allergy    Anemia    Asthma    "as a teenager"   Blind right eye    since childhood   Blood transfusion 1970   Chronic back pain    Chronic diarrhea    Colestipol therapy   Depression    Diverticulitis    Diverticulosis    Fatty liver    Female bladder prolapse    GERD (gastroesophageal reflux disease)    Glaucoma    H/O bone density study 11/29/2014   normal study although mild decreased in density from prior study   H/O hiatal hernia    Hemorrhoids    History of kidney stones    History of uterine cancer    s/p hysterectomy   Hyperlipidemia    Hypertension    Migraine    hx of migraines in past    Neuromuscular disorder (HCC)    diabetic neuropahthy   Numbness and tingling of both legs    Osteoarthritis    Rheumatic fever    age 89   Type II diabetes mellitus (HCC)    Urinary incontinence    Vitamin D deficiency     Past Surgical History:  Procedure Laterality Date    ABDOMINAL HYSTERECTOMY  1980   no further pap smears needed   ANTERIOR AND POSTERIOR REPAIR N/A 01/29/2015   Procedure: CYSTOCELE AND RECTOCELE ;  Surgeon: Alfredo Martinez, MD;  Location: [facility];  Service: Urology;  Laterality: N/A;   BACK SURGERY     BREAST BIOPSY  08/1997   right   CARDIAC CATHETERIZATION  11/2006   CHOLECYSTECTOMY     COLONOSCOPY  2014   polyps, external hemorrhoids   CYSTOSCOPY N/A 01/29/2015   Procedure: CYSTOSCOPY;  Surgeon: Alfredo Martinez, MD;  Location: [facility];  Service: Urology;  Laterality: N/A;   DILATION AND CURETTAGE OF UTERUS  1980   EDSI for back pain  last 01/2009   multiple x 5   EYE SURGERY     Bil Blind R eye   KIDNEY STONE SURGERY  1979   LIVER BIOPSY  04/1999   minor laceration repair     right eye   POSTERIOR FUSION LUMBAR SPINE  04/05/12   PULSE GENERATOR IMPLANT Right 03/12/2016   Procedure: Right Reposition of implantable pulse generator;  Surgeon: Maeola Harman, MD;  Location: [facility];  Service: Neurosurgery;  Laterality: Right;  Right Reposition of implantable pulse generator   SPINAL CORD STIMULATOR INSERTION N/A 09/06/2015   Procedure: LUMBAR SPINAL CORD STIMULATOR INSERTION;  Surgeon: Odette Fraction, MD;  Location: [facility];  Service: Neurosurgery;  Laterality: N/A;  Spinal Cord Stimulator placement   SPINAL CORD STIMULATOR INSERTION N/A 10/22/2015   Procedure: Laminectomy for spinal cord stimulator paddle lead and implantable generator placement;  Surgeon: Maeola Harman, MD;  Location: [facility];  Service: Neurosurgery;  Laterality: N/A;  Laminectomy for spinal cord stimulator paddle lead and implantable generator placement   steroid injections of hip  last 02/2009   multiple x 4   TONSILLECTOMY AND ADENOIDECTOMY  1952   TRABECULECTOMY  1996   bilaterally; "for glaucoma"   UPPER GI ENDOSCOPY  05/21/14   food, retained in the body of the stomach   VAGINAL PROLAPSE REPAIR N/A 01/29/2015   Procedure:  VAULT PROLAPSE WITH GRAFT ;   Surgeon: Alfredo Martinez, MD;  Location: [facility];  Service: Urology;  Laterality: N/A;    Prior to Admission medications   Medication Sig Start Date End Date Taking? Authorizing Provider  apixaban (ELIQUIS) 5 MG TABS tablet Take 1 tablet (5 mg total) by mouth 2 (two) times daily. 09/23/23  Yes Tysinger, Kermit Balo, PA-C  atorvastatin (LIPITOR) 80 MG tablet TAKE 1 TABLET(40 MG) BY MOUTH AT BEDTIME Patient taking differently: Take 80 mg by mouth at bedtime. 10/01/23  Yes Sharlene Dory, NP  brimonidine (ALPHAGAN) 0.2 % ophthalmic solution Place 1 drop into both eyes 2 (two) times daily. 01/25/17  Yes [provider]  celecoxib (CELEBREX) 200 MG capsule TAKE ONE CAPSULE BY MOUTH DAILY 10/19/23  Yes Tysinger, Kermit Balo, PA-C  Cholecalciferol (VITAMIN D3) 125 MCG (5000 UT) TABS Take 5,000 Units by mouth daily.   Yes [provider]  Continuous Glucose Sensor (FREESTYLE LIBRE 3 SENSOR) MISC place 1 sensor EVERY 14 DAYS Patient taking differently: Inject 1 Device into the skin every 14 (fourteen) days. 06/24/23  Yes Tysinger, Kermit Balo, PA-C  dexlansoprazole (DEXILANT) 60 MG capsule TAKE ONE CAPSULE BY MOUTH TWICE DAILY Patient taking differently: Take 60 mg by mouth 2 (two) times daily. 07/12/23  Yes Tysinger, Kermit Balo, PA-C  diltiazem (CARDIZEM) 30 MG tablet Take 30 mg by mouth 2 (two) times daily.   Yes [provider]  dorzolamide-timolol (COSOPT) 22.3-6.8 MG/ML ophthalmic solution Place 1 drop into both eyes 2 (two) times daily.   Yes [provider]  Dulaglutide (TRULICITY) 3 MG/0.5ML SOAJ INJECT 3 MG (1 pen) UNDER THE SKIN ONCE a WEEK Patient taking differently: Inject 3 mg into the skin every Monday. 11/01/23  Yes Tysinger, Kermit Balo, PA-C  DULoxetine (CYMBALTA) 60 MG capsule Take 1 capsule (60 mg total) by mouth 2 (two) times daily. Patient taking differently: Take 60 mg by mouth daily. 09/23/23  Yes Tysinger, Kermit Balo, PA-C  fluocinolone 0.01 % cream Apply topically at  bedtime as needed. Use q 3 days at a time for Itchy ears Patient taking differently: Apply 1 application  topically at bedtime as needed (to affected areas of the legs and arms). 09/23/23  Yes Tysinger, Kermit Balo, PA-C  hydrochlorothiazide (HYDRODIURIL) 25 MG tablet Take 1 tablet (25 mg total) by mouth daily. 09/29/23  Yes Tysinger, Kermit Balo, PA-C  hydrocortisone (ANUSOL-HC) 25 MG suppository Place 1 suppository (25 mg total) rectally 2 (two) times daily. Patient taking differently: Place 25 mg rectally 2 (two) times daily as needed for hemorrhoids. 09/23/23  Yes Tysinger, Kermit Balo, PA-C  latanoprost (XALATAN) 0.005 % ophthalmic solution Place 1 drop into both eyes at bedtime.   Yes [provider]  Magnesium  Citrate (MAGNESIUM GUMMIES PO) Take 500 mg by mouth daily.   Yes [provider]  metFORMIN (GLUCOPHAGE) 500 MG tablet TAKE 1 TABLET BY MOUTH TWICE DAILY WITH MEALS 07/12/23  Yes Tysinger, Kermit Balo, PA-C  morphine (MSIR) 15 MG tablet Take 15 mg by mouth every 4 (four) hours.   Yes [provider]  nortriptyline (PAMELOR) 10 MG capsule Take 10 mg by mouth daily as needed for sleep.   Yes [provider]  PROCTO-MED HC 2.5 % rectal cream APPLY RECTALLY TO THE AFFECTED AREA(S) TWICE DAILY Patient taking differently: Place 1 Application rectally 2 (two) times daily as needed for hemorrhoids. 09/06/23  Yes Tysinger, Kermit Balo, PA-C  TOUJEO SOLOSTAR 300 UNIT/ML Solostar Pen INJECT 50 UNITS INTO THE SKIN DAILY Patient taking differently: Inject 20-50 Units into the skin See admin instructions. Inject 20-50 units into the skin at bedtime AFTER checking BGL first 08/30/23  Yes Tysinger, Kermit Balo, PA-C  triamcinolone cream (KENALOG) 0.1 % Apply 1 Application topically daily as needed (for itching- affected sites). 02/23/23  Yes [provider]  Cholecalciferol (VITAMIN D) 50 MCG (2000 UT) CAPS Take 1 capsule (2,000 Units total) by mouth daily. Patient not taking: Reported  on 11/09/2023 12/16/22   Tysinger, Kermit Balo, PA-C  diltiazem (CARDIZEM) 60 MG tablet Take 1 tablet (60 mg total) by mouth 3 (three) times daily. Patient not taking: Reported on 11/09/2023 11/08/23   Mallipeddi, Orion Modest, MD  EASY TOUCH PEN NEEDLES 31G X 5 MM MISC USE AS DIRECTED TO INJECT TOUJEO DAILY 08/30/23   Tysinger, Kermit Balo, PA-C  enalapril (VASOTEC) 20 MG tablet TAKE 1 TABLET BY MOUTH DAILY Patient not taking: Reported on 11/09/2023 09/03/23   Tysinger, Kermit Balo, PA-C  LINZESS 72 MCG capsule TAKE ONE CAPSULE BY MOUTH EVERY DAY BEFORE breakfast Patient taking differently: Take 72 mcg by mouth. 09/01/23   Tysinger, Kermit Balo, PA-C    Scheduled Meds:  sodium chloride   Intravenous Once   atorvastatin  80 mg Oral Daily   diltiazem  60 mg Oral TID   DULoxetine  60 mg Oral BID   enalapril  20 mg Oral Daily   hydrochlorothiazide  25 mg Oral Daily   hydrocortisone  25 mg Rectal BID   insulin aspart  0-5 Units Subcutaneous QHS   insulin aspart  0-6 Units Subcutaneous TID WC   pantoprazole (PROTONIX) IV  40 mg Intravenous Q12H   sodium chloride flush  3 mL Intravenous Q12H   sodium chloride flush  3 mL Intravenous Q12H   Continuous Infusions:  sodium chloride 50 mL/hr at 11/10/23 0015   sodium chloride     PRN Meds:.sodium chloride, acetaminophen, morphine, ondansetron **OR** ondansetron (ZOFRAN) IV, senna-docusate, sodium chloride flush  Allergies as of 11/09/2023 - Review Complete 11/09/2023  Allergen Reaction Noted   Tape Other (See Comments) 10/16/2015   Kiwi extract Hives 02/23/2011   Lyrica [pregabalin] Other (See Comments) 09/21/2016   Sunflowerseed oil Hives 02/23/2011   Oxycodone hcl Nausea Only 01/08/2021   Hydrocodone-acetaminophen Nausea Only and Other (See Comments) 01/25/2017   Invokana [canagliflozin] Other (See Comments) 08/07/2015   Neurontin [gabapentin] Other (See Comments) 09/21/2016   Nitrofuran derivatives Nausea Only and Other (See Comments) 03/08/2017    Family  History  Problem Relation Age of Onset   Heart attack Brother 67   Hypertension Other    Diabetes Other    Cancer Mother 79   Aplastic anemia Mother 79   Heart attack Brother 62  Colon cancer Brother    Heart attack Sister    Heart attack Brother 55   Anesthesia problems Neg Hx     Social History   Socioeconomic History   Marital status: Widowed    Spouse name: Gerri Spore    Number of children: 3   Years of education: Not on file   Highest education level: Some college, no degree  Occupational History   Occupation: Retired   Tobacco Use   Smoking status: Never   Smokeless tobacco: Never  Vaping Use   Vaping status: Never Used  Substance and Sexual Activity   Alcohol use: Yes    Comment: rare   Drug use: No   Sexual activity: Not Currently    Comment: married  Other Topics Concern   Not on file  Social History Narrative   Three children and two step.     Right handed   Caffeine rarely    Lives at home with husband    Social Drivers of Health   Financial Resource Strain: Medium Risk (12/30/2021)   Overall Financial Resource Strain (CARDIA)    Difficulty of Paying Living Expenses: Somewhat hard  Food Insecurity: No Food Insecurity (11/09/2023)   Hunger Vital Sign    Worried About Running Out of Food in the Last Year: Never true    Ran Out of Food in the Last Year: Never true  Transportation Needs: No Transportation Needs (11/09/2023)   PRAPARE - Administrator, Civil Service (Medical): No    Lack of Transportation (Non-Medical): No  Physical Activity: Inactive (12/19/2021)   Exercise Vital Sign    Days of Exercise per Week: 0 days    Minutes of Exercise per Session: 0 min  Stress: No Stress Concern Present (01/28/2021)   [employer] of Occupational Health - Occupational Stress Questionnaire    Feeling of Stress : Not at all  Social Connections: Unknown (11/09/2023)   Social Connection and Isolation Panel [NHANES]    Frequency of Communication with  Friends and Family: More than three times a week    Frequency of Social Gatherings with Friends and Family: Once a week    Attends Religious Services: More than 4 times per year    Active Member of Clubs or Organizations: Patient unable to answer    Attends Banker Meetings: Patient unable to answer    Marital Status: Widowed  Intimate Partner Violence: Not At Risk (11/09/2023)   Humiliation, Afraid, Rape, and Kick questionnaire    Fear of Current or Ex-Partner: No    Emotionally Abused: No    Physically Abused: No    Sexually Abused: No    Review of Systems: All negative except as stated above in HPI.  Physical Exam: Vital signs: Vitals:   11/10/23 0339 11/10/23 1150  BP: (!) 138/52 (!) 153/74  Pulse: 67 64  Resp: 19 16  Temp: 98.2 F (36.8 C) 98.1 F (36.7 C)  SpO2: 94% 97%   Last BM Date : 11/08/23 General:   Alert,  Well-developed, well-nourished, pleasant and cooperative in NAD Lungs: No visible respiratory distress Heart:  Regular rate and rhythm; no murmurs, clicks, rubs,  or gallops. Abdomen: Soft, nontender, nondistended, bowel sound present, no peritoneal signs Mood and affect normal Alert and oriented x 3 Rectal:  Deferred  GI:  Lab Results: Recent Labs    11/09/23 1522 11/09/23 1628 11/09/23 2351 11/10/23 0458 11/10/23 0811  WBC 9.2  --   --  9.0  --  HGB 10.1*   < > 9.2* 8.9* 10.3*  HCT 32.0*   < > 30.3* 29.1* 33.4*  PLT 340  --   --  296  --    < > = values in this interval not displayed.   BMET Recent Labs    11/09/23 1522 11/09/23 1628 11/10/23 0458  NA 137 139 140  K 3.8 3.9 3.7  CL 102 101 105  CO2 26  --  26  GLUCOSE 115* 109* 118*  BUN 18 17 16   CREATININE 0.87 0.80 0.63  CALCIUM 9.2  --  9.3   LFT Recent Labs    11/10/23 0458  PROT 6.2*  ALBUMIN 3.4*  AST 28  ALT 22  ALKPHOS 67  BILITOT 0.7   PT/INR Recent Labs    11/09/23 1522  LABPROT 13.2  INR 1.0     Studies/Results: No results  found.  Impression/Plan: -GI bleed with melena and intermittent bright red blood per rectum. -Have atrial fibrillation.  On Eliquis.  Last dose yesterday. -Acute blood loss anemia.  Recommendations ------------------------- -Plan for EGD and colonoscopy tomorrow. -Okay to have clear liquids diet today.  Keep n.p.o. past midnight.  Continue to hold Eliquis for now.  Risks (bleeding, infection, bowel perforation that could require surgery, sedation-related changes in cardiopulmonary systems), benefits (identification and possible treatment of source of symptoms, exclusion of certain causes of symptoms), and alternatives (watchful waiting, radiographic imaging studies, empiric medical treatment)  were explained to patient/family in detail and patient wishes to proceed.     LOS: 1 day   Kathi Der  MD, FACP 11/10/2023, 2:04 PM  Contact #  [phone]

## 2023-11-10 NOTE — Plan of Care (Signed)
  Problem: Education: Goal: Ability to describe self-care measures that may prevent or decrease complications (Diabetes Survival Skills Education) will improve Outcome: Progressing   Problem: Coping: Goal: Ability to adjust to condition or change in health will improve Outcome: Progressing   Problem: Fluid Volume: Goal: Ability to maintain a balanced intake and output will improve Outcome: Progressing   Problem: Health Behavior/Discharge Planning: Goal: Ability to manage health-related needs will improve Outcome: Progressing   Problem: Metabolic: Goal: Ability to maintain appropriate glucose levels will improve Outcome: Progressing   Problem: Nutritional: Goal: Maintenance of adequate nutrition will improve Outcome: Progressing   Problem: Clinical Measurements: Goal: Diagnostic test results will improve Outcome: Progressing   Problem: Nutrition: Goal: Adequate nutrition will be maintained Outcome: Progressing   Problem: Skin Integrity: Goal: Risk for impaired skin integrity will decrease Outcome: Adequate for Discharge   Problem: Tissue Perfusion: Goal: Adequacy of tissue perfusion will improve Outcome: Adequate for Discharge   Problem: Education: Goal: Knowledge of General Education information will improve Description: Including pain rating scale, medication(s)/side effects and non-pharmacologic comfort measures Outcome: Adequate for Discharge   Problem: Health Behavior/Discharge Planning: Goal: Ability to manage health-related needs will improve Outcome: Adequate for Discharge   Problem: Clinical Measurements: Goal: Ability to maintain clinical measurements within normal limits will improve Outcome: Adequate for Discharge Goal: Will remain free from infection Outcome: Adequate for Discharge Goal: Respiratory complications will improve Outcome: Adequate for Discharge Goal: Cardiovascular complication will be avoided Outcome: Adequate for Discharge    Problem: Activity: Goal: Risk for activity intolerance will decrease Outcome: Adequate for Discharge   Problem: Coping: Goal: Level of anxiety will decrease Outcome: Adequate for Discharge   Problem: Elimination: Goal: Will not experience complications related to bowel motility Outcome: Adequate for Discharge Goal: Will not experience complications related to urinary retention Outcome: Adequate for Discharge   Problem: Pain Managment: Goal: General experience of comfort will improve and/or be controlled Outcome: Adequate for Discharge   Problem: Safety: Goal: Ability to remain free from injury will improve Outcome: Adequate for Discharge   Problem: Skin Integrity: Goal: Risk for impaired skin integrity will decrease Outcome: Adequate for Discharge

## 2023-11-10 NOTE — TOC Initial Note (Signed)
Transition of Care [facility]) - Initial/Assessment Note    Patient Details  Name: Robyn Hill MRN: [id number] Date of Birth: 1945/10/28  Transition of Care [facility]) CM/SW Contact:    Lanier Clam, RN Phone Number: 11/10/2023, 12:14 PM  Clinical Narrative:  d/c plan home.                 Expected Discharge Plan: Home/Self Care Barriers to Discharge: Continued Medical Work up   Patient Goals and CMS Choice Patient states their goals for this hospitalization and ongoing recovery are:: Home          Expected Discharge Plan and Services                                              Prior Living Arrangements/Services                       Activities of Daily Living   ADL Screening (condition at time of admission) Independently performs ADLs?: Yes (appropriate for developmental age) Is the patient deaf or have difficulty hearing?: No Does the patient have difficulty seeing, even when wearing glasses/contacts?: No Does the patient have difficulty concentrating, remembering, or making decisions?: No  Permission Sought/Granted                  Emotional Assessment              Admission diagnosis:  Rectal bleeding [K62.5] Lower GI bleed [K92.2] Patient Active Problem List   Diagnosis Date Noted   Chronic lower GI bleeding 11/09/2023   External hemorrhoid 11/09/2023   History of CAD (coronary artery disease) 11/09/2023   Hyperlipidemia 11/09/2023   GAD (generalized anxiety disorder) 11/09/2023   Acute lower GI bleeding 11/09/2023   Depressed mood 10/25/2023   Ear itch 10/25/2023   Polypharmacy 06/24/2023   Hypoglycemia 02/17/2023   Decreased urine stream 02/17/2023   Dysphagia 12/15/2022   Mild cognitive impairment 12/15/2022   Therapeutic opioid induced constipation 12/15/2022   Urinary retention 08/10/2022   Memory change 08/10/2022   Chronic constipation 08/10/2022   Chronic pain syndrome 08/10/2022   Murmur 02/18/2022   Sleep  disturbance 02/16/2022   Elevated pulse rate 02/16/2022   Ataxia 02/16/2022   Bilateral carotid artery stenosis 01/12/2022   Palpitations 01/12/2022   Impingement syndrome of left shoulder 12/16/2021   Cardiac arrhythmia 11/02/2021   Stenosis of carotid artery 11/02/2021   Low back pain 10/29/2021   Chronic left shoulder pain 09/01/2021   Grieving 09/01/2021   Encounter for health maintenance examination in adult 05/23/2021   Flat foot 05/23/2021   Non-insulin dependent type 2 diabetes mellitus (HCC) 05/23/2021   Medicare annual wellness visit, subsequent 05/22/2021   Osteopenia 05/22/2021   Advanced directives, counseling/discussion 05/22/2021   Pruritic condition 05/22/2021   Myalgia 05/22/2021   Chronic cough 01/08/2021   History of COVID-19 10/09/2020   Aortic atherosclerosis (HCC) 10/09/2020   Gastroesophageal reflux disease 07/22/2020   Hiatal hernia 07/22/2020   Muscle spasm 06/05/2020   Chest wall pain 06/05/2020   Post-menopausal 11/16/2019   Estrogen deficiency 11/16/2019   Loose stools 10/11/2019   Urinary incontinence 07/17/2019   Diabetic polyneuropathy associated with type 2 diabetes mellitus (HCC) 11/09/2018   Hammer toes of both feet 11/09/2018   Pre-ulcerative calluses 11/09/2018   Decreased pedal pulses 11/09/2018   Paroxysmal A-fib (HCC)  08/05/2018   Vaccine counseling 03/31/2018   Constipation due to opioid therapy 09/21/2016   History of back surgery 09/21/2016   Nocturnal leg movements 08/26/2016   Vitamin D deficiency 08/26/2016   Periodic limb movement disorder 08/03/2016   Fatigue 08/03/2016   Anemia of chronic disease 08/03/2016   Post laminectomy syndrome 03/12/2016   Sweating abnormality 02/19/2016   Internal hemorrhoid 02/19/2016   Gastroesophageal reflux disease without esophagitis 02/19/2016   Chronic pain 10/22/2015   History of thyroid disease 08/06/2015   Arthritis 04/18/2015   Prolapse of female bladder, acquired 01/29/2015    History of uterine cancer 05/02/2014   Essential hypertension 01/03/2014   Chronic back pain 01/03/2014   Glaucoma 01/03/2014   Leukocytosis 01/03/2014   Spondylolisthesis of lumbar region 11/02/2013   Diabetes mellitus with complication (HCC) 11/25/2011   PCP:  Jac Canavan, PA-C Pharmacy:   [facility] - [place] - [address] [place] [address] Phone: [phone] Fax: [phone]     Social Drivers of Health (SDOH) Social History: SDOH Screenings   Food Insecurity: No Food Insecurity (11/09/2023)  Housing: Low Risk  (11/09/2023)  Transportation Needs: No Transportation Needs (11/09/2023)  Utilities: Not At Risk (11/09/2023)  Alcohol Screen: Low Risk  (01/28/2021)  Depression (PHQ2-9): Medium Risk (02/17/2023)  Financial Resource Strain: Medium Risk (12/30/2021)  Physical Activity: Inactive (12/19/2021)  Social Connections: Unknown (11/09/2023)  Stress: No Stress Concern Present (01/28/2021)  Tobacco Use: Low Risk  (11/09/2023)   SDOH Interventions:     Readmission Risk Interventions     No data to display

## 2023-11-11 ENCOUNTER — Inpatient Hospital Stay (HOSPITAL_COMMUNITY): Payer: 59 | Admitting: Anesthesiology

## 2023-11-11 ENCOUNTER — Other Ambulatory Visit: Payer: Self-pay | Admitting: Medical

## 2023-11-11 ENCOUNTER — Encounter (HOSPITAL_COMMUNITY)
Admission: EM | Disposition: A | Discharge status: Home / Self Care | Payer: Self-pay | Source: Home / Self Care | Attending: Family Medicine

## 2023-11-11 ENCOUNTER — Encounter (HOSPITAL_COMMUNITY): Payer: Self-pay | Admitting: Internal Medicine

## 2023-11-11 DIAGNOSIS — K31811 Angiodysplasia of stomach and duodenum with bleeding: Secondary | ICD-10-CM

## 2023-11-11 DIAGNOSIS — E119 Type 2 diabetes mellitus without complications: Secondary | ICD-10-CM | POA: Diagnosis not present

## 2023-11-11 DIAGNOSIS — E118 Type 2 diabetes mellitus with unspecified complications: Secondary | ICD-10-CM

## 2023-11-11 DIAGNOSIS — K922 Gastrointestinal hemorrhage, unspecified: Secondary | ICD-10-CM | POA: Diagnosis not present

## 2023-11-11 DIAGNOSIS — Z7984 Long term (current) use of oral hypoglycemic drugs: Secondary | ICD-10-CM | POA: Diagnosis not present

## 2023-11-11 DIAGNOSIS — I10 Essential (primary) hypertension: Secondary | ICD-10-CM | POA: Diagnosis not present

## 2023-11-11 HISTORY — PX: HOT HEMOSTASIS: SHX5433

## 2023-11-11 HISTORY — PX: ESOPHAGOGASTRODUODENOSCOPY (EGD) WITH PROPOFOL: SHX5813

## 2023-11-11 LAB — CBC WITH DIFFERENTIAL/PLATELET
Abs Immature Granulocytes: 0.02 10*3/uL (ref 0.00–0.07)
Basophils Absolute: 0.1 10*3/uL (ref 0.0–0.1)
Basophils Relative: 1 %
Eosinophils Absolute: 0.3 10*3/uL (ref 0.0–0.5)
Eosinophils Relative: 4 %
HCT: 30.4 % — ABNORMAL LOW (ref 36.0–46.0)
Hemoglobin: 9.3 g/dL — ABNORMAL LOW (ref 12.0–15.0)
Immature Granulocytes: 0 %
Lymphocytes Relative: 28 %
Lymphs Abs: 2.4 10*3/uL (ref 0.7–4.0)
MCH: 24.9 pg — ABNORMAL LOW (ref 26.0–34.0)
MCHC: 30.6 g/dL (ref 30.0–36.0)
MCV: 81.3 fL (ref 80.0–100.0)
Monocytes Absolute: 0.8 10*3/uL (ref 0.1–1.0)
Monocytes Relative: 9 %
Neutro Abs: 5 10*3/uL (ref 1.7–7.7)
Neutrophils Relative %: 58 %
Platelets: 308 10*3/uL (ref 150–400)
RBC: 3.74 MIL/uL — ABNORMAL LOW (ref 3.87–5.11)
RDW: 12.6 % (ref 11.5–15.5)
WBC: 8.5 10*3/uL (ref 4.0–10.5)
nRBC: 0 % (ref 0.0–0.2)

## 2023-11-11 LAB — GLUCOSE, CAPILLARY
Glucose-Capillary: 124 mg/dL — ABNORMAL HIGH (ref 70–99)
Glucose-Capillary: 109 mg/dL — ABNORMAL HIGH (ref 70–99)
Glucose-Capillary: 96 mg/dL (ref 70–99)
Glucose-Capillary: 106 mg/dL — ABNORMAL HIGH (ref 70–99)

## 2023-11-11 LAB — COMPREHENSIVE METABOLIC PANEL
ALT: 22 U/L (ref 0–44)
AST: 25 U/L (ref 15–41)
Albumin: 3.4 g/dL — ABNORMAL LOW (ref 3.5–5.0)
Alkaline Phosphatase: 69 U/L (ref 38–126)
Anion gap: 9 (ref 5–15)
BUN: 10 mg/dL (ref 8–23)
CO2: 25 mmol/L (ref 22–32)
Calcium: 9.1 mg/dL (ref 8.9–10.3)
Chloride: 105 mmol/L (ref 98–111)
Creatinine, Ser: 0.7 mg/dL (ref 0.44–1.00)
GFR, Estimated: >60 mL/min (ref >60)
Glucose, Bld: 122 mg/dL — ABNORMAL HIGH (ref 70–99)
Potassium: 3.8 mmol/L (ref 3.5–5.1)
Sodium: 139 mmol/L (ref 135–145)
Total Bilirubin: 0.5 mg/dL (ref 0.0–1.2)
Total Protein: 6.6 g/dL (ref 6.5–8.1)

## 2023-11-11 LAB — MAGNESIUM: Magnesium: 1.8 mg/dL (ref 1.7–2.4)

## 2023-11-11 LAB — PHOSPHORUS: Phosphorus: 4.3 mg/dL (ref 2.5–4.6)

## 2023-11-11 SURGERY — ESOPHAGOGASTRODUODENOSCOPY (EGD) WITH PROPOFOL
Anesthesia: Monitor Anesthesia Care

## 2023-11-11 MED ORDER — BISACODYL 5 MG PO TBEC: 10.0000 mg | DELAYED_RELEASE_TABLET | Freq: Every day | ORAL | Status: DC | PRN

## 2023-11-11 MED ORDER — GLYCOPYRROLATE 0.2 MG/ML IJ SOLN
INTRAMUSCULAR | Status: DC | PRN
  Administered 2023-11-11: .1 mg via INTRAVENOUS

## 2023-11-11 MED ORDER — PROPOFOL 500 MG/50ML IV EMUL
INTRAVENOUS | Status: DC | PRN
  Administered 2023-11-11: 50 ug/kg/min via INTRAVENOUS
  Administered 2023-11-11: 40 mg via INTRAVENOUS

## 2023-11-11 MED ORDER — SODIUM CHLORIDE 0.9 % IV SOLN: INTRAVENOUS | Status: DC | PRN

## 2023-11-11 SURGICAL SUPPLY — 24 items
BLOCK BITE 60FR ADLT L/F BLUE (MISCELLANEOUS) ×1 IMPLANT
ELECT REM PT RETURN 9FT ADLT (ELECTROSURGICAL) IMPLANT
ELECTRODE REM PT RTRN 9FT ADLT (ELECTROSURGICAL) IMPLANT
FCP BXJMBJMB 240X2.8X (CUTTING FORCEPS)
FLOOR PAD 36X40 (MISCELLANEOUS) ×1 IMPLANT
FORCEP RJ3 GP 1.8X160 W-NEEDLE (CUTTING FORCEPS) IMPLANT
FORCEPS BIOP RAD 4 LRG CAP 4 (CUTTING FORCEPS) IMPLANT
FORCEPS BIOP RJ4 240 W/NDL (CUTTING FORCEPS) IMPLANT
FORCEPS BXJMBJMB 240X2.8X (CUTTING FORCEPS) IMPLANT
INJECTOR/SNARE I SNARE (MISCELLANEOUS) IMPLANT
LUBRICANT JELLY 4.5OZ STERILE (MISCELLANEOUS) IMPLANT
MANIFOLD NEPTUNE II (INSTRUMENTS) IMPLANT
NDL SCLEROTHERAPY 25GX240 (NEEDLE) IMPLANT
NEEDLE SCLEROTHERAPY 25GX240 (NEEDLE) IMPLANT
PAD FLOOR 36X40 (MISCELLANEOUS) ×1 IMPLANT
PROBE APC STR FIRE (PROBE) IMPLANT
PROBE INJECTION GOLD 7FR (MISCELLANEOUS) IMPLANT
SNARE ROTATE MED OVAL 20MM (MISCELLANEOUS) IMPLANT
SNARE SHORT THROW 13M SML OVAL (MISCELLANEOUS) IMPLANT
SYR 50ML LL SCALE MARK (SYRINGE) IMPLANT
TRAP SPECIMEN MUCOUS 40CC (MISCELLANEOUS) IMPLANT
TUBING ENDO SMARTCAP PENTAX (MISCELLANEOUS) ×2 IMPLANT
TUBING IRRIGATION ENDOGATOR (MISCELLANEOUS) ×1 IMPLANT
WATER STERILE IRR 1000ML POUR (IV SOLUTION) IMPLANT

## 2023-11-11 NOTE — Anesthesia Preprocedure Evaluation (Addendum)
Anesthesia Evaluation  Patient identified by MRN, date of birth, ID band Patient awake    Reviewed: Allergy & Precautions, NPO status , Patient's Chart, lab work & pertinent test results  Airway Mallampati: IV  TM Distance: >3 FB Neck ROM: Full    Dental   Pulmonary asthma    Pulmonary exam normal        Cardiovascular hypertension, Normal cardiovascular exam     Neuro/Psych  Headaches  Neuromuscular disease (s/p spinal cord stimulator)    GI/Hepatic Neg liver ROS, hiatal hernia,GERD  ,,GI bleed   Endo/Other  diabetes, Type 2, Oral Hypoglycemic Agents    Renal/GU negative Renal ROS     Musculoskeletal  (+) Arthritis ,    Abdominal   Peds  Hematology  (+) Blood dyscrasia (Last eliquis 1/20), anemia   Anesthesia Other Findings   Reproductive/Obstetrics                             Anesthesia Physical Anesthesia Plan  ASA: 3  Anesthesia Plan: MAC   Post-op Pain Management:    Induction:   PONV Risk Score and Plan: 2 and Propofol infusion  Airway Management Planned: Natural Airway and Nasal Cannula  Additional Equipment:   Intra-op Plan:   Post-operative Plan:   Informed Consent: I have reviewed the patients History and Physical, chart, labs and discussed the procedure including the risks, benefits and alternatives for the proposed anesthesia with the patient or authorized representative who has indicated his/her understanding and acceptance.       Plan Discussed with:   Anesthesia Plan Comments:        Anesthesia Quick Evaluation

## 2023-11-11 NOTE — Addendum Note (Signed)
Addended by: Herminio Commons A on: 11/11/2023 11:10 AM   Modules accepted: Orders

## 2023-11-11 NOTE — Brief Op Note (Addendum)
11/11/2023  1:06 PM  PATIENT:  Robyn Hill  78 y.o. female  PRE-OPERATIVE DIAGNOSIS:  GI bleed  POST-OPERATIVE DIAGNOSIS:  gastric AVM APD'd  PROCEDURE:  Procedure(s): ESOPHAGOGASTRODUODENOSCOPY (EGD) WITH PROPOFOL (N/A) HOT HEMOSTASIS (ARGON PLASMA COAGULATION/BICAP) (N/A)  SURGEON:  Surgeons and Role:    * Latroya Ng, MD - Primary  Findings ------------- -EGD showed small gastric AVM with active bleeding in the prepyloric area.  Treated with APC.  Recommendations ------------------------- -Monitor H&H. -Patient is requesting to have inpatient colonoscopy done because of rectal bleeding and previous history of polyps.  Advised her that it is important for her to finish the prep so we can have a good visualization.  She is agreeable to drink the prep today.  Plan for colonoscopy tomorrow. -Clear Liquid diet today.  Keep n.p.o. past midnight.  Continue to hold Eliquis for now.   Kathi Der MD, FACP 11/11/2023, 1:08 PM  Contact #  [phone]

## 2023-11-11 NOTE — Progress Notes (Signed)
Mobility Specialist - Progress Note   11/11/23 0950  Mobility  Activity Ambulated with assistance in hallway  Level of Assistance Standby assist, set-up cues, supervision of patient - no hands on  Assistive Device Front wheel walker  Distance Ambulated (ft) 180 ft  Range of Motion/Exercises Active  Activity Response Tolerated well  Mobility Referral Yes  Mobility visit 1 Mobility  Mobility Specialist Start Time (ACUTE ONLY) 0931  Mobility Specialist Stop Time (ACUTE ONLY) 0940  Mobility Specialist Time Calculation (min) (ACUTE ONLY) 9 min   Received in bed and agreed to mobility. Had no issues throughout session. Returned to bed with all needs met.  Marilynne Halsted Mobility Specialist

## 2023-11-11 NOTE — Progress Notes (Signed)
[facility] Progress Note  Robyn Hill 78 y.o. 1946-06-01  CC: GI bleed   Subjective: Patient seen and examined at bedside.  She did not drink much of her colonoscopy prep.  Denies any bleeding today.  ROS : Afebrile, negative for chest pain   Objective: Vital signs in last 24 hours: Vitals:   11/11/23 1100 11/11/23 1115  BP: (!) 137/53 (!) 140/49  Pulse: 65   Resp: 16 16  Temp: (!) 97.5 F (36.4 C) (!) 97.3 F (36.3 C)  SpO2: 99% 98%    Physical Exam: Elderly patient, not in acute distress.  Mood and affect normal.  Abdominal exam benign.  Alert and oriented x 3.  Lab Results: Recent Labs    11/10/23 0458 11/11/23 0444  NA 140 139  K 3.7 3.8  CL 105 105  CO2 26 25  GLUCOSE 118* 122*  BUN 16 10  CREATININE 0.63 0.70  CALCIUM 9.3 9.1  MG  --  1.8  PHOS  --  4.3   Recent Labs    11/10/23 0458 11/11/23 0444  AST 28 25  ALT 22 22  ALKPHOS 67 69  BILITOT 0.7 0.5  PROT 6.2* 6.6  ALBUMIN 3.4* 3.4*   Recent Labs    11/10/23 0458 11/10/23 0811 11/10/23 1752 11/11/23 0444  WBC 9.0  --   --  8.5  NEUTROABS  --   --   --  5.0  HGB 8.9*   < > 9.6* 9.3*  HCT 29.1*   < > 30.9* 30.4*  MCV 80.6  --   --  81.3  PLT 296  --   --  308   < > = values in this interval not displayed.   Recent Labs    11/09/23 1522  LABPROT 13.2  INR 1.0      Assessment/Plan: -GI bleed with melena and intermittent bright red blood per rectum. -Have atrial fibrillation.  On Eliquis.  Last dose day before admission. -Acute blood loss anemia.  Hemoglobin stable now.   Recommendations ------------------------- -Patient did not finish her prep for colonoscopy.  Having brown-colored stool.  We would just do endoscopy today for evaluation of melena.  If EGD negative .then we will plan for colonoscopy tomorrow.  Risks (bleeding, infection, bowel perforation that could require surgery, sedation-related changes in cardiopulmonary systems), benefits (identification and  possible treatment of source of symptoms, exclusion of certain causes of symptoms), and alternatives (watchful waiting, radiographic imaging studies, empiric medical treatment)  were explained to patient/family in detail and patient wishes to proceed.    Kathi Der MD, FACP 11/11/2023, 11:36 AM  Contact #  [phone]

## 2023-11-11 NOTE — Anesthesia Procedure Notes (Signed)
Procedure Name: General with mask airway Date/Time: 11/11/2023 12:45 PM  Performed by: Floydene Flock, CRNAPre-anesthesia Checklist: Patient identified, Emergency Drugs available, Suction available, Patient being monitored and Timeout performed Patient Re-evaluated:Patient Re-evaluated prior to induction Oxygen Delivery Method: Simple face mask Preoxygenation: Pre-oxygenation with 100% oxygen Placement Confirmation: positive ETCO2

## 2023-11-11 NOTE — Transfer of Care (Addendum)
Immediate Anesthesia Transfer of Care Note  Patient: Robyn Hill  Procedure(s) Performed: ESOPHAGOGASTRODUODENOSCOPY (EGD) WITH PROPOFOL  Patient Location: PACU  Anesthesia Type:MAC and General  Level of Consciousness: drowsy  Airway & Oxygen Therapy: Patient Spontanous Breathing and Patient connected to face mask oxygen  Post-op Assessment: Report given to RN and Post -op Vital signs reviewed and stable  Post vital signs: Reviewed and stable  Last Vitals:  Vitals Value Taken Time  BP    Temp    Pulse    Resp    SpO2      Last Pain:  Vitals:   11/11/23 1115  TempSrc: Temporal  PainSc: 0-No pain      Patients Stated Pain Goal: 0 (11/10/23 1446)  Complications: No notable events documented.

## 2023-11-11 NOTE — Op Note (Signed)
[facility] Patient Name: Robyn Hill Procedure Date: 11/11/2023 MRN: [id number] Attending MD: Kathi Der , MD, [id number] Date of Birth: 1946-03-21 CSN: [id number] Age: 78 Admit Type: Inpatient Procedure:                Upper GI endoscopy Indications:              Recent gastrointestinal bleeding Providers:                Kathi Der, MD, Zoe Lan, RN, Alan Ripper,                            Technician Referring MD:              Medicines:                Sedation Administered by an Anesthesia Professional Complications:            No immediate complications. Estimated Blood Loss:     Estimated blood loss was minimal. Estimated blood                            loss was minimal. Procedure:                Pre-Anesthesia Assessment:                           - Prior to the procedure, a History and Physical                            was performed, and patient medications and                            allergies were reviewed. The patient's tolerance of                            previous anesthesia was also reviewed. The risks                            and benefits of the procedure and the sedation                            options and risks were discussed with the patient.                            All questions were answered, and informed consent                            was obtained. Prior Anticoagulants: The patient has                            taken Eliquis (apixaban), last dose was 2 days                            prior to procedure. ASA Grade Assessment: III - A  patient with severe systemic disease. After                            reviewing the risks and benefits, the patient was                            deemed in satisfactory condition to undergo the                            procedure.                           After obtaining informed consent, the endoscope was                            passed under direct  vision. Throughout the                            procedure, the patient's blood pressure, pulse, and                            oxygen saturations were monitored continuously. The                            [id number]-[id number] ([id number] Olympus endoscope was introduced                            through the mouth, and advanced to the second part                            of duodenum. The upper GI endoscopy was                            accomplished without difficulty. The patient                            tolerated the procedure well. Scope In: Scope Out: Findings:      The Z-line was regular and was found 37 cm from the incisors.      A small hiatal hernia was present.      Clotted blood was found in the gastric antrum.      A single small angioectasia with bleeding was found in the prepyloric       region of the stomach. Fulguration to ablate the lesion to prevent       bleeding by argon plasma was successful.      The cardia and gastric fundus were normal on retroflexion.      The duodenal bulb, first portion of the duodenum and second portion of       the duodenum were normal. Impression:               - Z-line regular, 37 cm from the incisors.                           - Small hiatal hernia.                           -  Clotted blood in the gastric antrum.                           - A single bleeding angioectasia in the stomach.                            Treated with argon plasma coagulation (APC).                           - Normal duodenal bulb, first portion of the                            duodenum and second portion of the duodenum.                           - No specimens collected. Moderate Sedation:      Moderate (conscious) sedation was personally administered by an       anesthesia professional. The following parameters were monitored: oxygen       saturation, heart rate, blood pressure, and response to care. Recommendation:           - Return patient to hospital ward for  ongoing care.                           - Soft diet.                           - Continue present medications. Procedure Code(s):        --- Professional ---                           [phone], Esophagogastroduodenoscopy, flexible,                            transoral; with control of bleeding, any method Diagnosis Code(s):        --- Professional ---                           K44.9, Diaphragmatic hernia without obstruction or                            gangrene                           K92.2, Gastrointestinal hemorrhage, unspecified                           K31.811, Angiodysplasia of stomach and duodenum                            with bleeding CPT copyright 2022 American Medical Association. All rights reserved. The codes documented in this report are preliminary and upon coder review may  be revised to meet current compliance requirements. Kathi Der, MD Kathi Der, MD 11/11/2023 1:03:59 PM Number of Addenda: 0

## 2023-11-11 NOTE — Progress Notes (Signed)
Restarted bowel prep regimen at 17:15, gave a dose of zofran to help with the nausea. Patient agreeable to try to drink entire prep and remain NPO at midnight.

## 2023-11-11 NOTE — Plan of Care (Signed)

## 2023-11-11 NOTE — Progress Notes (Signed)
PROGRESS NOTE    [name]  WJX:[id number] DOB: 1946/01/03 DOA: 11/09/2023 PCP: Jac Canavan, PA-C  Chief Complaint  Patient presents with   Rectal Bleeding    Brief Narrative:   Robyn Hill is Robyn Hill 78 y.o. female with medical history significant of atrial fibrillation on Eliquis, DM type II, CAD, hyperlipidemia, external/internal hemorrhoid, chronic fatigue, hypertension, chronic carotid atherosclerosis angina anxiety disorder presented to emergency department complaining of black stool for 2 weeks.   Assessment & Plan:   Principal Problem:   Acute lower GI bleeding Active Problems:   Chronic lower GI bleeding   Essential hypertension   Internal hemorrhoid   Gastroesophageal reflux disease without esophagitis   Paroxysmal Davone Shinault-fib (HCC)   Non-insulin dependent type 2 diabetes mellitus (HCC)   Bilateral carotid artery stenosis   Chronic pain syndrome   External hemorrhoid   History of CAD (coronary artery disease)   Hyperlipidemia   GAD (generalized anxiety disorder)  Acute lower GI bleed History of intermittent hemorrhoidal bleeding History of internal/external hemorrhoid - Patient presenting with intermittent bleeding during bowel movement for last 2 weeks and she noticed 1 episodes of black tarry stool few days ago.  she has history of chronic intermittent bleeding with bowel movement however this is more persistent than typical.  Rectal exam was performed by ED physician which showed external hemorrhoid. -- colonoscopy noted 07/2013 (unclear if any more recent) with no active bleeding, no colonic neoplasms, no colonic or rectal polyps, scattered diverticula and redundant sigmoid colon (I'm unable to review official report) -[facility] consulted -> EGD today, possible colonoscopy tomorrow - trend Hb/Hct -hold celebrex and eliquis  -PPI BID  -hydrocortisone suppository BID   Paroxysmal atrial fibrillation - Patient's CHA2DS2-VASc score = 5. -Holding Eliquis in the  setting of GI bleed - Continue Cardizem 60 mg 3 times daily. -Continue cardiac monitoring.   Essential hypertension -blood pressure is elevated in the ED.  Renal function stable.  Resuming enalapril 20 mg, hydrochlorothiazide 25 mg.   Bilateral Carotid artery stenosis -Continue Lipitor 40 mg daily   Chronic pain syndrome -Holding Celebrex in the setting of GI bleed.  Informed patient at the bedside to avoid further NSAID and ibuprofen containing medication in the future. - APAP - Starting tramadol 50 mg every 8 hour as needed for severe pain.   History of CAD -Continue Lipitor.   Generalized anxiety disorder - Continue Cymbalta 60 mg twice daily   Non-insulin-dependent DM type II - Holding metformin in the acute setting. - Continue low sliding scale SSI and at bedtime insulin as needed coverage.    DVT prophylaxis: SCD Code Status: full Family Communication: none Disposition:   Status is: Inpatient Remains inpatient appropriate because: need for inpatient care   Consultants:  GI  Procedures:  none  Antimicrobials:  Anti-infectives (From admission, onward)    None       Subjective: No complaints  Objective: Vitals:   11/10/23 2142 11/11/23 0727 11/11/23 1100 11/11/23 1115  BP: (!) 154/58 (!) 144/92 (!) 137/53 (!) 140/49  Pulse: 66 65 65   Resp: 18 16 16 16   Temp: 97.9 F (36.6 C) 98.4 F (36.9 C) (!) 97.5 F (36.4 C) (!) 97.3 F (36.3 C)  TempSrc:  Oral Oral Temporal  SpO2: 93% 95% 99% 98%  Weight:      Height:        Intake/Output Summary (Last 24 hours) at 11/11/2023 1205 Last data filed at 11/11/2023 1027 Gross per 24 hour  Intake 370 ml  Output 300 ml  Net 70 ml   Filed Weights   11/10/23 0550  Weight: 81.3 kg    Examination:  General exam: NAD Respiratory system: unlabored Gastrointestinal system: brown stool in bedside commode Central nervous system: Alert and oriented. No focal neurological deficits. Extremities: no  LEE   Data Reviewed: I have personally reviewed following labs and imaging studies  CBC: Recent Labs  Lab 11/09/23 1522 11/09/23 1628 11/09/23 2351 11/10/23 0458 11/10/23 0811 11/10/23 1752 11/11/23 0444  WBC 9.2  --   --  9.0  --   --  8.5  NEUTROABS  --   --   --   --   --   --  5.0  HGB 10.1*   < > 9.2* 8.9* 10.3* 9.6* 9.3*  HCT 32.0*   < > 30.3* 29.1* 33.4* 30.9* 30.4*  MCV 80.4  --   --  80.6  --   --  81.3  PLT 340  --   --  296  --   --  308   < > = values in this interval not displayed.    Basic Metabolic Panel: Recent Labs  Lab 11/09/23 1522 11/09/23 1628 11/10/23 0458 11/11/23 0444  NA 137 139 140 139  K 3.8 3.9 3.7 3.8  CL 102 101 105 105  CO2 26  --  26 25  GLUCOSE 115* 109* 118* 122*  BUN 18 17 16 10   CREATININE 0.87 0.80 0.63 0.70  CALCIUM 9.2  --  9.3 9.1  MG  --   --   --  1.8  PHOS  --   --   --  4.3    GFR: Estimated Creatinine Clearance: 60.7 mL/min (by C-G formula based on SCr of 0.7 mg/dL).  Liver Function Tests: Recent Labs  Lab 11/09/23 1522 11/10/23 0458 11/11/23 0444  AST 31 28 25   ALT 24 22 22   ALKPHOS 74 67 69  BILITOT 0.6 0.7 0.5  PROT 6.9 6.2* 6.6  ALBUMIN 3.8 3.4* 3.4*    CBG: Recent Labs  Lab 11/10/23 1147 11/10/23 1606 11/10/23 2223 11/11/23 0729 11/11/23 1057  GLUCAP 104* 182* 152* 124* 109*     No results found for this or any previous visit (from the past 240 hours).       Radiology Studies: No results found.      Scheduled Meds:  [MAR Hold] sodium chloride   Intravenous Once   [MAR Hold] atorvastatin  80 mg Oral Daily   [MAR Hold] diltiazem  60 mg Oral TID   [MAR Hold] DULoxetine  60 mg Oral BID   [MAR Hold] enalapril  20 mg Oral Daily   [MAR Hold] hydrochlorothiazide  25 mg Oral Daily   [MAR Hold] hydrocortisone  25 mg Rectal BID   [MAR Hold] insulin aspart  0-5 Units Subcutaneous QHS   [MAR Hold] insulin aspart  0-6 Units Subcutaneous TID WC   [MAR Hold] pantoprazole (PROTONIX) IV  40  mg Intravenous Q12H   [MAR Hold] sodium chloride flush  3 mL Intravenous Q12H   [MAR Hold] sodium chloride flush  3 mL Intravenous Q12H   Continuous Infusions:     LOS: 2 days    Time spent: over 30 min    Lacretia Nicks, MD [facility]   To contact the attending provider between 7A-7P or the covering provider during after hours 7P-7A, please log into the web site [email or web address] and access using universal [place] password for  that web site. If you do not have the password, please call the hospital operator.  11/11/2023, 12:05 PM

## 2023-11-12 ENCOUNTER — Encounter (HOSPITAL_COMMUNITY): Payer: Self-pay | Admitting: Internal Medicine

## 2023-11-12 ENCOUNTER — Inpatient Hospital Stay (HOSPITAL_COMMUNITY): Payer: 59 | Admitting: Anesthesiology

## 2023-11-12 ENCOUNTER — Encounter (HOSPITAL_COMMUNITY)
Admission: EM | Disposition: A | Discharge status: Home / Self Care | Payer: Self-pay | Source: Home / Self Care | Attending: Family Medicine

## 2023-11-12 DIAGNOSIS — D123 Benign neoplasm of transverse colon: Secondary | ICD-10-CM

## 2023-11-12 DIAGNOSIS — D120 Benign neoplasm of cecum: Secondary | ICD-10-CM | POA: Diagnosis not present

## 2023-11-12 DIAGNOSIS — K625 Hemorrhage of anus and rectum: Secondary | ICD-10-CM

## 2023-11-12 DIAGNOSIS — K31811 Angiodysplasia of stomach and duodenum with bleeding: Secondary | ICD-10-CM

## 2023-11-12 DIAGNOSIS — D509 Iron deficiency anemia, unspecified: Secondary | ICD-10-CM | POA: Diagnosis not present

## 2023-11-12 DIAGNOSIS — K922 Gastrointestinal hemorrhage, unspecified: Secondary | ICD-10-CM | POA: Diagnosis not present

## 2023-11-12 DIAGNOSIS — K449 Diaphragmatic hernia without obstruction or gangrene: Secondary | ICD-10-CM

## 2023-11-12 HISTORY — PX: COLONOSCOPY WITH PROPOFOL: SHX5780

## 2023-11-12 HISTORY — PX: HEMOSTASIS CLIP PLACEMENT: SHX6857

## 2023-11-12 HISTORY — PX: POLYPECTOMY: SHX5525

## 2023-11-12 LAB — CBC WITH DIFFERENTIAL/PLATELET
Abs Immature Granulocytes: 0.03 10*3/uL (ref 0.00–0.07)
Basophils Absolute: 0.1 10*3/uL (ref 0.0–0.1)
Basophils Relative: 1 %
Eosinophils Absolute: 0.4 10*3/uL (ref 0.0–0.5)
Eosinophils Relative: 5 %
HCT: 29.8 % — ABNORMAL LOW (ref 36.0–46.0)
Hemoglobin: 9.2 g/dL — ABNORMAL LOW (ref 12.0–15.0)
Immature Granulocytes: 0 %
Lymphocytes Relative: 27 %
Lymphs Abs: 2.5 10*3/uL (ref 0.7–4.0)
MCH: 25.3 pg — ABNORMAL LOW (ref 26.0–34.0)
MCHC: 30.9 g/dL (ref 30.0–36.0)
MCV: 81.9 fL (ref 80.0–100.0)
Monocytes Absolute: 1 10*3/uL (ref 0.1–1.0)
Monocytes Relative: 11 %
Neutro Abs: 5.2 10*3/uL (ref 1.7–7.7)
Neutrophils Relative %: 56 %
Platelets: 318 10*3/uL (ref 150–400)
RBC: 3.64 MIL/uL — ABNORMAL LOW (ref 3.87–5.11)
RDW: 12.9 % (ref 11.5–15.5)
WBC: 9.2 10*3/uL (ref 4.0–10.5)
nRBC: 0 % (ref 0.0–0.2)

## 2023-11-12 LAB — COMPREHENSIVE METABOLIC PANEL
ALT: 28 U/L (ref 0–44)
AST: 33 U/L (ref 15–41)
Albumin: 3.2 g/dL — ABNORMAL LOW (ref 3.5–5.0)
Alkaline Phosphatase: 76 U/L (ref 38–126)
Anion gap: 9 (ref 5–15)
BUN: 11 mg/dL (ref 8–23)
CO2: 26 mmol/L (ref 22–32)
Calcium: 8.6 mg/dL — ABNORMAL LOW (ref 8.9–10.3)
Chloride: 100 mmol/L (ref 98–111)
Creatinine, Ser: 0.65 mg/dL (ref 0.44–1.00)
GFR, Estimated: >60 mL/min (ref >60)
Glucose, Bld: 106 mg/dL — ABNORMAL HIGH (ref 70–99)
Potassium: 3.6 mmol/L (ref 3.5–5.1)
Sodium: 135 mmol/L (ref 135–145)
Total Bilirubin: 0.6 mg/dL (ref 0.0–1.2)
Total Protein: 6.4 g/dL — ABNORMAL LOW (ref 6.5–8.1)

## 2023-11-12 LAB — MAGNESIUM: Magnesium: 1.8 mg/dL (ref 1.7–2.4)

## 2023-11-12 LAB — PHOSPHORUS: Phosphorus: 4.5 mg/dL (ref 2.5–4.6)

## 2023-11-12 LAB — GLUCOSE, CAPILLARY
Glucose-Capillary: 106 mg/dL — ABNORMAL HIGH (ref 70–99)
Glucose-Capillary: 104 mg/dL — ABNORMAL HIGH (ref 70–99)

## 2023-11-12 SURGERY — COLONOSCOPY WITH PROPOFOL
Anesthesia: Monitor Anesthesia Care

## 2023-11-12 MED ORDER — PROPOFOL 500 MG/50ML IV EMUL
INTRAVENOUS | Status: DC | PRN
  Administered 2023-11-12: 65 ug/kg/min via INTRAVENOUS
  Administered 2023-11-12: 40 mg via INTRAVENOUS
  Administered 2023-11-12: 20 mg via INTRAVENOUS

## 2023-11-12 MED ORDER — ALBUMIN HUMAN 5 % IV SOLN
12.5000 g | Freq: Once | INTRAVENOUS | Status: AC
  Administered 2023-11-12: 12.5 g via INTRAVENOUS

## 2023-11-12 MED ORDER — ALBUMIN HUMAN 5 % IV SOLN
INTRAVENOUS | Status: AC
  Filled 2023-11-12: qty 250

## 2023-11-12 MED ORDER — SODIUM CHLORIDE 0.9 % IV SOLN: INTRAVENOUS | Status: DC | PRN

## 2023-11-12 MED ORDER — PROPOFOL 500 MG/50ML IV EMUL
INTRAVENOUS | Status: AC
  Filled 2023-11-12: qty 50

## 2023-11-12 MED ORDER — APIXABAN 5 MG PO TABS: 5.0000 mg | ORAL_TABLET | Freq: Two times a day (BID) | ORAL | Status: DC

## 2023-11-12 MED ORDER — LIDOCAINE HCL (PF) 2 % IJ SOLN
INTRAMUSCULAR | Status: DC | PRN
Start: 2023-11-12 — End: 2023-11-12
  Administered 2023-11-12: 60 mg via INTRADERMAL

## 2023-11-12 MED ORDER — ATORVASTATIN CALCIUM 80 MG PO TABS: 80.0000 mg | ORAL_TABLET | Freq: Every day | ORAL | Status: DC

## 2023-11-12 MED ORDER — PHENYLEPHRINE 80 MCG/ML (10ML) SYRINGE FOR IV PUSH (FOR BLOOD PRESSURE SUPPORT)
PREFILLED_SYRINGE | INTRAVENOUS | Status: DC | PRN
  Administered 2023-11-12: 80 ug via INTRAVENOUS

## 2023-11-12 MED ORDER — AMISULPRIDE (ANTIEMETIC) 5 MG/2ML IV SOLN
10.0000 mg | Freq: Once | INTRAVENOUS | Status: DC | PRN
Start: 2023-11-12 — End: 2023-11-12

## 2023-11-12 MED ORDER — ONDANSETRON HCL 4 MG/2ML IJ SOLN
4.0000 mg | Freq: Once | INTRAMUSCULAR | Status: DC | PRN
Start: 2023-11-12 — End: 2023-11-12

## 2023-11-12 SURGICAL SUPPLY — 21 items
ELECT REM PT RETURN 9FT ADLT (ELECTROSURGICAL) IMPLANT
ELECTRODE REM PT RTRN 9FT ADLT (ELECTROSURGICAL) IMPLANT
FCP BXJMBJMB 240X2.8X (CUTTING FORCEPS)
FLOOR PAD 36X40 (MISCELLANEOUS) ×2 IMPLANT
FORCEPS BIOP RAD 4 LRG CAP 4 (CUTTING FORCEPS) IMPLANT
FORCEPS BIOP RJ4 240 W/NDL (CUTTING FORCEPS) IMPLANT
FORCEPS BXJMBJMB 240X2.8X (CUTTING FORCEPS) IMPLANT
INJECTOR/SNARE I SNARE (MISCELLANEOUS) IMPLANT
LUBRICANT JELLY 4.5OZ STERILE (MISCELLANEOUS) IMPLANT
MANIFOLD NEPTUNE II (INSTRUMENTS) IMPLANT
NDL SCLEROTHERAPY 25GX240 (NEEDLE) IMPLANT
NEEDLE SCLEROTHERAPY 25GX240 (NEEDLE) IMPLANT
PAD FLOOR 36X40 (MISCELLANEOUS) ×2 IMPLANT
PROBE APC STR FIRE (PROBE) IMPLANT
PROBE INJECTION GOLD 7FR (MISCELLANEOUS) IMPLANT
SNARE ROTATE MED OVAL 20MM (MISCELLANEOUS) IMPLANT
SYR 50ML LL SCALE MARK (SYRINGE) IMPLANT
TRAP SPECIMEN MUCOUS 40CC (MISCELLANEOUS) IMPLANT
TUBING ENDO SMARTCAP PENTAX (MISCELLANEOUS) IMPLANT
TUBING IRRIGATION ENDOGATOR (MISCELLANEOUS) ×2 IMPLANT
WATER STERILE IRR 1000ML POUR (IV SOLUTION) IMPLANT

## 2023-11-12 NOTE — Plan of Care (Signed)
Problem: Education: Goal: Ability to describe self-care measures that may prevent or decrease complications (Diabetes Survival Skills Education) will improve 11/12/2023 1724 by Westley Foots, RN Outcome: Adequate for Discharge 11/12/2023 1036 by Westley Foots, RN Outcome: Progressing Goal: Individualized Educational Video(s) 11/12/2023 1724 by Westley Foots, RN Outcome: Adequate for Discharge 11/12/2023 1036 by Westley Foots, RN Outcome: Progressing   Problem: Coping: Goal: Ability to adjust to condition or change in health will improve 11/12/2023 1724 by Westley Foots, RN Outcome: Adequate for Discharge 11/12/2023 1036 by Westley Foots, RN Outcome: Progressing   Problem: Fluid Volume: Goal: Ability to maintain a balanced intake and output will improve 11/12/2023 1724 by Westley Foots, RN Outcome: Adequate for Discharge 11/12/2023 1036 by Westley Foots, RN Outcome: Progressing   Problem: Health Behavior/Discharge Planning: Goal: Ability to identify and utilize available resources and services will improve 11/12/2023 1724 by Westley Foots, RN Outcome: Adequate for Discharge 11/12/2023 1036 by Westley Foots, RN Outcome: Progressing Goal: Ability to manage health-related needs will improve 11/12/2023 1724 by Westley Foots, RN Outcome: Adequate for Discharge 11/12/2023 1036 by Westley Foots, RN Outcome: Progressing   Problem: Metabolic: Goal: Ability to maintain appropriate glucose levels will improve 11/12/2023 1724 by Westley Foots, RN Outcome: Adequate for Discharge 11/12/2023 1036 by Westley Foots, RN Outcome: Progressing   Problem: Nutritional: Goal: Maintenance of adequate nutrition will improve 11/12/2023 1724 by Westley Foots, RN Outcome: Adequate for Discharge 11/12/2023 1036 by Westley Foots, RN Outcome: Progressing Goal: Progress toward achieving an optimal  weight will improve 11/12/2023 1724 by Westley Foots, RN Outcome: Adequate for Discharge 11/12/2023 1036 by Westley Foots, RN Outcome: Progressing   Problem: Skin Integrity: Goal: Risk for impaired skin integrity will decrease 11/12/2023 1724 by Westley Foots, RN Outcome: Adequate for Discharge 11/12/2023 1036 by Westley Foots, RN Outcome: Progressing   Problem: Tissue Perfusion: Goal: Adequacy of tissue perfusion will improve 11/12/2023 1724 by Westley Foots, RN Outcome: Adequate for Discharge 11/12/2023 1036 by Westley Foots, RN Outcome: Progressing   Problem: Education: Goal: Knowledge of General Education information will improve Description: Including pain rating scale, medication(s)/side effects and non-pharmacologic comfort measures 11/12/2023 1724 by Westley Foots, RN Outcome: Adequate for Discharge 11/12/2023 1036 by Westley Foots, RN Outcome: Progressing   Problem: Health Behavior/Discharge Planning: Goal: Ability to manage health-related needs will improve 11/12/2023 1724 by Westley Foots, RN Outcome: Adequate for Discharge 11/12/2023 1036 by Westley Foots, RN Outcome: Progressing   Problem: Clinical Measurements: Goal: Ability to maintain clinical measurements within normal limits will improve 11/12/2023 1724 by Westley Foots, RN Outcome: Adequate for Discharge 11/12/2023 1036 by Westley Foots, RN Outcome: Progressing Goal: Will remain free from infection 11/12/2023 1724 by Westley Foots, RN Outcome: Adequate for Discharge 11/12/2023 1036 by Westley Foots, RN Outcome: Progressing Goal: Diagnostic test results will improve 11/12/2023 1724 by Westley Foots, RN Outcome: Adequate for Discharge 11/12/2023 1036 by Westley Foots, RN Outcome: Progressing Goal: Respiratory complications will improve 11/12/2023 1724 by Westley Foots, RN Outcome: Adequate for  Discharge 11/12/2023 1036 by Westley Foots, RN Outcome: Progressing Goal: Cardiovascular complication will be avoided 11/12/2023 1724 by Westley Foots, RN Outcome: Adequate for Discharge 11/12/2023 1036 by Westley Foots, RN Outcome: Progressing   Problem: Activity: Goal: Risk for activity intolerance will decrease 11/12/2023 1724 by Westley Foots, RN Outcome: Adequate for Discharge  11/12/2023 1036 by Westley Foots, RN Outcome: Progressing   Problem: Nutrition: Goal: Adequate nutrition will be maintained 11/12/2023 1724 by Westley Foots, RN Outcome: Adequate for Discharge 11/12/2023 1036 by Westley Foots, RN Outcome: Progressing   Problem: Coping: Goal: Level of anxiety will decrease 11/12/2023 1724 by Westley Foots, RN Outcome: Adequate for Discharge 11/12/2023 1036 by Westley Foots, RN Outcome: Progressing   Problem: Elimination: Goal: Will not experience complications related to bowel motility 11/12/2023 1724 by Westley Foots, RN Outcome: Adequate for Discharge 11/12/2023 1036 by Westley Foots, RN Outcome: Progressing Goal: Will not experience complications related to urinary retention 11/12/2023 1724 by Westley Foots, RN Outcome: Adequate for Discharge 11/12/2023 1036 by Westley Foots, RN Outcome: Progressing   Problem: Pain Managment: Goal: General experience of comfort will improve and/or be controlled 11/12/2023 1724 by Westley Foots, RN Outcome: Adequate for Discharge 11/12/2023 1036 by Westley Foots, RN Outcome: Progressing   Problem: Safety: Goal: Ability to remain free from injury will improve 11/12/2023 1724 by Westley Foots, RN Outcome: Adequate for Discharge 11/12/2023 1036 by Westley Foots, RN Outcome: Progressing   Problem: Skin Integrity: Goal: Risk for impaired skin integrity will decrease 11/12/2023 1724 by Westley Foots, RN Outcome:  Adequate for Discharge 11/12/2023 1036 by Westley Foots, RN Outcome: Progressing

## 2023-11-12 NOTE — Plan of Care (Signed)

## 2023-11-12 NOTE — Anesthesia Postprocedure Evaluation (Signed)
Anesthesia Post Note  Patient: KEVIONNA HEFFLER  Procedure(s) Performed: COLONOSCOPY WITH PROPOFOL POLYPECTOMY HEMOSTASIS CLIP PLACEMENT     Patient location during evaluation: PACU Anesthesia Type: MAC Level of consciousness: awake and alert Pain management: pain level controlled Vital Signs Assessment: post-procedure vital signs reviewed and stable Respiratory status: spontaneous breathing, nonlabored ventilation, respiratory function stable and patient connected to nasal cannula oxygen Cardiovascular status: stable and blood pressure returned to baseline Postop Assessment: no apparent nausea or vomiting Anesthetic complications: no  No notable events documented.  Last Vitals:  Vitals:   11/12/23 1420 11/12/23 1430  BP: (!) 104/38 (!) 98/33  Pulse: (!) 57 (!) 58  Resp: 20 14  Temp:    SpO2: 95% 96%    Last Pain:  Vitals:   11/12/23 1430  TempSrc:   PainSc: 0-No pain                 Shelton Silvas

## 2023-11-12 NOTE — Brief Op Note (Signed)
11/12/2023  2:15 PM  PATIENT:  Robyn Hill  78 y.o. female  PRE-OPERATIVE DIAGNOSIS:  GI Bleed  POST-OPERATIVE DIAGNOSIS:  cecal polyp cold snare, transverse colon polyp cold snare, hemostasis clip placement x 2, diverticulosis, hemorrhoids  PROCEDURE:  Procedure(s): COLONOSCOPY WITH PROPOFOL (N/A) POLYPECTOMY HEMOSTASIS CLIP PLACEMENT  SURGEON:  Surgeons and Role:    * Maxmilian Trostel, MD - Primary  Findings ----------- -Colonoscopy showed 20 mm sessile polyp in the transverse colon which was removed with piecemeal fashion.  2 clips placed.  Another small polyp in the cecum removed with a cold snare.  Diverticulosis and hemorrhoids.  Normal TI.  No evidence of bleeding.  Recommendations --------------------------- -Advance diet as tolerated -Okay to discharge from GI standpoint. -Resume Eliquis after 2 days. -GI will sign off.  Call us back if needed  Kathi Der MD, FACP 11/12/2023, 2:16 PM  Contact #  [phone]

## 2023-11-12 NOTE — Transfer of Care (Signed)
Immediate Anesthesia Transfer of Care Note  Patient: Robyn Hill  Procedure(s) Performed: COLONOSCOPY WITH PROPOFOL POLYPECTOMY HEMOSTASIS CLIP PLACEMENT  Patient Location: PACU  Anesthesia Type:MAC  Level of Consciousness: awake  Airway & Oxygen Therapy: Patient Spontanous Breathing and Patient connected to face mask oxygen  Post-op Assessment: Report given to RN and Post -op Vital signs reviewed and stable  Post vital signs: Reviewed and stable  Last Vitals:  Vitals Value Taken Time  BP 101/26 11/12/23 1410  Temp 36.7 C 11/12/23 1410  Pulse 52 11/12/23 1414  Resp 17 11/12/23 1414  SpO2 100 % 11/12/23 1414  Vitals shown include unfiled device data.  Last Pain:  Vitals:   11/12/23 1410  TempSrc: Temporal  PainSc:       Patients Stated Pain Goal: 2 (11/12/23 0931)  Complications: No notable events documented.

## 2023-11-12 NOTE — Anesthesia Preprocedure Evaluation (Addendum)
Anesthesia Evaluation  Patient identified by MRN, date of birth, ID band Patient awake    Reviewed: Allergy & Precautions, NPO status , Patient's Chart, lab work & pertinent test results  Airway Mallampati: IV     Mouth opening: Limited Mouth Opening  Dental  (+) Teeth Intact, Dental Advisory Given   Pulmonary asthma    breath sounds clear to auscultation       Cardiovascular hypertension, Pt. on medications + Valvular Problems/Murmurs  Rhythm:Regular Rate:Bradycardia     Neuro/Psych  Headaches PSYCHIATRIC DISORDERS Anxiety Depression     Neuromuscular disease    GI/Hepatic Neg liver ROS, hiatal hernia,GERD  ,,  Endo/Other  diabetes, Type 2, Oral Hypoglycemic Agents    Renal/GU negative Renal ROS     Musculoskeletal  (+) Arthritis ,    Abdominal   Peds  Hematology  (+) Blood dyscrasia, anemia   Anesthesia Other Findings   Reproductive/Obstetrics                             Anesthesia Physical Anesthesia Plan  ASA: 3  Anesthesia Plan: MAC   Post-op Pain Management: Minimal or no pain anticipated   Induction: Intravenous  PONV Risk Score and Plan: 0 and Propofol infusion  Airway Management Planned: Natural Airway and Nasal Cannula  Additional Equipment: None  Intra-op Plan:   Post-operative Plan:   Informed Consent: I have reviewed the patients History and Physical, chart, labs and discussed the procedure including the risks, benefits and alternatives for the proposed anesthesia with the patient or authorized representative who has indicated his/her understanding and acceptance.       Plan Discussed with: CRNA  Anesthesia Plan Comments:        Anesthesia Quick Evaluation

## 2023-11-12 NOTE — Op Note (Signed)
[facility] Patient Name: Robyn Hill Procedure Date: 11/12/2023 MRN: [id number] Attending MD: Kathi Der , MD, [id number] Date of Birth: 1946/07/10 CSN: [id number] Age: 78 Admit Type: Inpatient Procedure:                Colonoscopy Indications:              Rectal bleeding, Iron deficiency anemia Providers:                Kathi Der, MD, Lorenza Evangelist, RN, Rhodia Albright, Technician Referring MD:              Medicines:                Sedation Administered by an Anesthesia Professional Complications:            No immediate complications. Estimated Blood Loss:     Estimated blood loss was minimal. Procedure:                Pre-Anesthesia Assessment:                           - Prior to the procedure, a History and Physical                            was performed, and patient medications and                            allergies were reviewed. The patient's tolerance of                            previous anesthesia was also reviewed. The risks                            and benefits of the procedure and the sedation                            options and risks were discussed with the patient.                            All questions were answered, and informed consent                            was obtained. Prior Anticoagulants: The patient has                            taken Eliquis (apixaban), last dose was 4 days                            prior to procedure. ASA Grade Assessment: III - A                            patient with severe systemic disease. After  reviewing the risks and benefits, the patient was                            deemed in satisfactory condition to undergo the                            procedure.                           After obtaining informed consent, the colonoscope                            was passed under direct vision. Throughout the                             procedure, the patient's blood pressure, pulse, and                            oxygen saturations were monitored continuously. The                            PCF-HQ190L ([id number]) Olympus colonoscope was                            introduced through the anus and advanced to the the                            terminal ileum, with identification of the                            appendiceal orifice and IC valve. The colonoscopy                            was performed with moderate difficulty. Successful                            completion of the procedure was aided by applying                            abdominal pressure. The patient tolerated the                            procedure well. The quality of the bowel                            preparation was fair. The terminal ileum, ileocecal                            valve, appendiceal orifice, and rectum were                            photographed. Scope In: 1:40:32 PM Scope Out: 2:05:12 PM Scope Withdrawal Time: 0 hours 13 minutes 58 seconds  Total Procedure Duration: 0 hours 24 minutes 40 seconds  Findings:  Skin tags were found on perianal exam.      The terminal ileum appeared normal.      A 5 mm polyp was found in the cecum. The polyp was sessile. The polyp       was removed with a cold snare. Resection and retrieval were complete.      A 20 mm polyp was found in the transverse colon. The polyp was sessile.       The polyp was removed with a piecemeal technique using a cold snare.       Resection and retrieval were complete. To close a defect after       polypectomy, two hemostatic clips were successfully placed. There was no       bleeding at the end of the procedure.      A few small-mouthed diverticula were found in the sigmoid colon.      Internal hemorrhoids were found during retroflexion. The hemorrhoids       were small. Impression:               - Preparation of the colon was fair.                           -  Perianal skin tags found on perianal exam.                           - The examined portion of the ileum was normal.                           - One 5 mm polyp in the cecum, removed with a cold                            snare. Resected and retrieved.                           - One 20 mm polyp in the transverse colon, removed                            piecemeal using a cold snare. Resected and                            retrieved. Clips were placed.                           - Diverticulosis in the sigmoid colon.                           - Internal hemorrhoids. Moderate Sedation:      Moderate (conscious) sedation was personally administered by an       anesthesia professional. The following parameters were monitored: oxygen       saturation, heart rate, blood pressure, and response to care. Recommendation:           - Return patient to hospital ward for ongoing care.                           - Cardiac diet.                           -  Continue present medications.                           - Await pathology results.                           - Repeat colonoscopy date to be determined after                            pending pathology results are reviewed for                            surveillance after piecemeal polypectomy.                           - Resume Eliquis (apixaban) at prior dose in 2 days. Procedure Code(s):        --- Professional ---                           [phone], Colonoscopy, flexible; with removal of                            tumor(s), polyp(s), or other lesion(s) by snare                            technique Diagnosis Code(s):        --- Professional ---                           K64.8, Other hemorrhoids                           D12.0, Benign neoplasm of cecum                           D12.3, Benign neoplasm of transverse colon (hepatic                            flexure or splenic flexure)                           K64.4, Residual hemorrhoidal skin tags                            K62.5, Hemorrhage of anus and rectum                           D50.9, Iron deficiency anemia, unspecified                           K57.30, Diverticulosis of large intestine without                            perforation or abscess without bleeding CPT copyright 2022 American [employer]. All rights reserved. The codes documented in this report are preliminary and upon coder review may  be revised to meet current compliance requirements. Robyn Harriman,  MD Kathi Der, MD 11/12/2023 2:14:13 PM Number of Addenda: 0

## 2023-11-12 NOTE — Progress Notes (Signed)
PROGRESS NOTE    [name]  XBJ:[id number] DOB: 26-Jul-1946 DOA: 11/09/2023 PCP: Jac Canavan, PA-C  Chief Complaint  Patient presents with   Rectal Bleeding    Brief Narrative:   Robyn Hill is Robyn Hill 78 y.o. female with medical history significant of atrial fibrillation on Eliquis, DM type II, CAD, hyperlipidemia, external/internal hemorrhoid, chronic fatigue, hypertension, chronic carotid atherosclerosis angina anxiety disorder presented to emergency department complaining of black stool for 2 weeks.   Assessment & Plan:   Principal Problem:   Acute lower GI bleeding Active Problems:   Chronic lower GI bleeding   Essential hypertension   Internal hemorrhoid   Gastroesophageal reflux disease without esophagitis   Paroxysmal [place] Bec[place]-fib (HCC)   Non-insulin dependent type 2 diabetes mellitus (HCC)   Bilateral carotid artery stenosis   Chronic pain syndrome   External hemorrhoid   History of CAD (coronary artery disease)   Hyperlipidemia   GAD (generalized anxiety disorder)  Acute lower GI bleed History of intermittent hemorrhoidal bleeding History of internal/external hemorrhoid - Patient presenting with intermittent bleeding during bowel movement for last 2 weeks and she noticed 1 episodes of black tarry stool few days ago.  she has history of chronic intermittent bleeding with bowel movement however this is more persistent than typical.  Rectal exam was performed by ED physician which showed external hemorrhoid. -- colonoscopy noted 07/2013 (unclear if any more recent) with no active bleeding, no colonic neoplasms, no colonic or rectal polyps, scattered diverticula and redundant sigmoid colon (I'm unable to review official report) -[facility] consulted -> EGD with bleeding angioectasia in stomach, treated with APC , colonoscopy today - trend Hb/Hct -hold celebrex and eliquis  -PPI BID  -hydrocortisone suppository BID   Paroxysmal atrial fibrillation - Patient's  CHA2DS2-VASc score = 5. -Holding Eliquis in the setting of GI bleed - Continue Cardizem 60 mg 3 times daily. -Continue cardiac monitoring.   Essential hypertension -enalapril 20 mg, hydrochlorothiazide 25 mg.   Bilateral Carotid artery stenosis -Continue Lipitor 40 mg daily   Chronic pain syndrome -Holding Celebrex in the setting of GI bleed.  Informed patient at the bedside to avoid further NSAIDs in the future. - APAP - tramadol 50 mg every 8 hour as needed for severe pain.   History of CAD -Continue Lipitor.   Generalized anxiety disorder - Continue Cymbalta 60 mg twice daily   Non-insulin-dependent DM type II - Holding metformin in the acute setting. - Continue low sliding scale SSI and at bedtime insulin as needed coverage.    DVT prophylaxis: SCD Code Status: full Family Communication: none Disposition:   Status is: Inpatient Remains inpatient appropriate because: need for inpatient care   Consultants:  GI  Procedures:  none  Antimicrobials:  Anti-infectives (From admission, onward)    None       Subjective: No complaints  Objective: Vitals:   11/11/23 1330 11/11/23 1342 11/11/23 2034 11/12/23 0527  BP: (!) 128/48 (!) 124/56 (!) 108/51 (!) 105/39  Pulse: 63 66 64 (!) 59  Resp: 16 18 18 18   Temp:  98.9 F (37.2 C) 98.2 F (36.8 C) 97.7 F (36.5 C)  TempSrc:  Oral Oral Oral  SpO2: 97% 95% 93% 90%  Weight:      Height:        Intake/Output Summary (Last 24 hours) at 11/12/2023 1114 Last data filed at 11/12/2023 1000 Gross per 24 hour  Intake 390 ml  Output --  Net 390 ml   [employer]  11/10/23 0550  Weight: 81.3 kg    Examination:  General: No acute distress. Cardiovascular: RRR Lungs: unlabored Neurological: Alert and oriented 3. Moves all extremities 4 with equal strength. Cranial nerves II through XII grossly intact. Extremities: No clubbing or cyanosis. No edema.   Data Reviewed: I have personally reviewed following  labs and imaging studies  CBC: Recent Labs  Lab 11/09/23 1522 11/09/23 1628 11/10/23 0458 11/10/23 0811 11/10/23 1752 11/11/23 0444 11/12/23 0446  WBC 9.2  --  9.0  --   --  8.5 9.2  NEUTROABS  --   --   --   --   --  5.0 5.2  HGB 10.1*   < > 8.9* 10.3* 9.6* 9.3* 9.2*  HCT 32.0*   < > 29.1* 33.4* 30.9* 30.4* 29.8*  MCV 80.4  --  80.6  --   --  81.3 81.9  PLT 340  --  296  --   --  308 318   < > = values in this interval not displayed.    Basic Metabolic Panel: Recent Labs  Lab 11/09/23 1522 11/09/23 1628 11/10/23 0458 11/11/23 0444 11/12/23 0446  NA 137 139 140 139 135  K 3.8 3.9 3.7 3.8 3.6  CL 102 101 105 105 100  CO2 26  --  26 25 26   GLUCOSE 115* 109* 118* 122* 106*  BUN 18 17 16 10 11   CREATININE 0.87 0.80 0.63 0.70 0.65  CALCIUM 9.2  --  9.3 9.1 8.6*  MG  --   --   --  1.8 1.8  PHOS  --   --   --  4.3 4.5    GFR: Estimated Creatinine Clearance: 60.7 mL/min (by C-G formula based on SCr of 0.65 mg/dL).  Liver Function Tests: Recent Labs  Lab 11/09/23 1522 11/10/23 0458 11/11/23 0444 11/12/23 0446  AST 31 28 25  33  ALT 24 22 22 28   ALKPHOS 74 67 69 76  BILITOT 0.6 0.7 0.5 0.6  PROT 6.9 6.2* 6.6 6.4*  ALBUMIN 3.8 3.4* 3.4* 3.2*    CBG: Recent Labs  Lab 11/11/23 0729 11/11/23 1057 11/11/23 1635 11/11/23 2031 11/12/23 0743  GLUCAP 124* 109* 96 106* 106*     No results found for this or any previous visit (from the past 240 hours).       Radiology Studies: No results found.      Scheduled Meds:  sodium chloride   Intravenous Once   atorvastatin  80 mg Oral Daily   diltiazem  60 mg Oral TID   DULoxetine  60 mg Oral BID   enalapril  20 mg Oral Daily   hydrochlorothiazide  25 mg Oral Daily   hydrocortisone  25 mg Rectal BID   insulin aspart  0-5 Units Subcutaneous QHS   insulin aspart  0-6 Units Subcutaneous TID WC   pantoprazole (PROTONIX) IV  40 mg Intravenous Q12H   sodium chloride flush  3 mL Intravenous Q12H   sodium  chloride flush  3 mL Intravenous Q12H   Continuous Infusions:     LOS: 3 days    Time spent: over 30 min    Lacretia Nicks, MD [facility]   To contact the attending provider between 7A-7P or the covering provider during after hours 7P-7A, please log into the web site [email or web address] and access using universal [place] password for that web site. If you do not have the password, please call the hospital operator.  11/12/2023, 11:14 AM

## 2023-11-12 NOTE — Progress Notes (Signed)
Mobility Specialist - Progress Note   11/12/23 0845  Mobility  Activity Ambulated with assistance in hallway  Level of Assistance Standby assist, set-up cues, supervision of patient - no hands on  Assistive Device Front wheel walker  Distance Ambulated (ft) 250 ft  Range of Motion/Exercises Active  Activity Response Tolerated well  Mobility Referral Yes  Mobility visit 1 Mobility  Mobility Specialist Start Time (ACUTE ONLY) [id number]  Mobility Specialist Stop Time (ACUTE ONLY) 0845  Mobility Specialist Time Calculation (min) (ACUTE ONLY) 10 min   Pt was found in bed and agreeable to ambulate. Grew fatigued with session. At EOS returned to sit EOB with all needs met. Call bell in reach and RN notified.  Billey Chang Mobility Specialist

## 2023-11-12 NOTE — Discharge Summary (Signed)
Physician Discharge Summary  Robyn Hill ZOX:[id number] DOB: 1945-11-06 DOA: 11/09/2023  PCP: Robyn Canavan, PA-C  Admit date: 11/09/2023 Discharge date: 11/12/2023  Time spent: 40 minutes  Recommendations for Outpatient Follow-up:  Follow outpatient CBC/CMP  Follow GI recs outpatient  Insulin discontinued at discharge - follow BG control outpatient  Discharge Diagnoses:  Principal Problem:   Acute lower GI bleeding Active Problems:   Chronic lower GI bleeding   Essential hypertension   Internal hemorrhoid   Gastroesophageal reflux disease without esophagitis   Paroxysmal Robyn Hill (HCC)   Non-insulin dependent type 2 diabetes mellitus (HCC)   Bilateral carotid artery stenosis   Chronic pain syndrome   External hemorrhoid   History of CAD (coronary artery disease)   Hyperlipidemia   GAD (generalized anxiety disorder)   Discharge Condition: stable  Diet recommendation: heart healthy, diabetic  Filed Weights   11/10/23 0550  Weight: 81.3 kg    History of present illness:   Robyn Hill is Robyn Hill 78 y.o. female with medical history significant of atrial fibrillation on Eliquis, DM type II, CAD, hyperlipidemia, external/internal hemorrhoid, chronic fatigue, hypertension, chronic carotid atherosclerosis angina anxiety disorder presented to emergency department complaining of black stool for 2 weeks.   Now s/p EGD/colonoscopy showing bleeding angioectasia in stomach which was treated.  She's stable for discharge on 1/24.  Planning to hold eliquis x48 hrs.  Hospital Course:  Assessment and Plan:  Acute lower GI bleed History of intermittent hemorrhoidal bleeding History of internal/external hemorrhoid - Patient presenting with intermittent bleeding during bowel movement for last 2 weeks and she noticed 1 episodes of black tarry stool few days ago.  she has history of chronic intermittent bleeding with bowel movement however this is more persistent than typical.  Rectal  exam was performed by ED physician which showed external hemorrhoid. 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  Diverticulosis, internal hemorrhoids.  See reports. Follow biopsies. -- Per discussion with GI, ok to resume eliquis after 2 days, ok to discharge from GI standpoint.  Continue PPI daily x4 weeks (she's already on chronic dexilant).   Paroxysmal atrial fibrillation - Patient's CHA2DS2-VASc score = 5. - resume eliquis in 48 hrs - Continue Cardizem 60 mg 3 times daily. -Continue cardiac monitoring.   Essential hypertension -enalapril 20 mg, hydrochlorothiazide 25 mg.   Bilateral Carotid artery stenosis -Continue Lipitor 40 mg daily   Chronic pain syndrome -avoid NSAIDs - APAP   History of CAD -Continue Lipitor.   Generalized anxiety disorder - Continue Cymbalta 60 mg twice daily   Non-insulin-dependent DM type II - resume metformin and trulicity at discharge -- she notes several lows per week at home, she's needed minimal SSI - stop her home basal insulin   Obesity Body mass index is 30.77 kg/m.     Procedures: Endoscopy colonoscopy   Consultations: GI  Discharge Exam: Vitals:   11/12/23 1439 11/12/23 1452  BP: (!) 108/39 (!) 114/54  Pulse: (!) 55 62  Resp: (!) 21   Temp:    SpO2: 97% 96%   Eager to discharge after procedure  See progress note  Discharge Instructions   Discharge Instructions  Call MD for:  difficulty breathing, headache or visual disturbances   Complete by: As directed    Call MD for:  extreme fatigue   Complete by: As directed    Call MD for:  hives   Complete by: As  directed    Call MD for:  persistant dizziness or light-headedness   Complete by: As directed    Call MD for:  persistant nausea and vomiting   Complete by: As directed    Call MD for:  redness, tenderness, or signs of infection (pain, swelling, redness, odor or green/yellow discharge around incision site)   Complete by: As directed    Call MD for:  severe uncontrolled pain   Complete by: As directed    Call MD for:  temperature >100.4   Complete by: As directed    Diet - low sodium heart healthy   Complete by: As directed    Discharge instructions   Complete by: As directed    You were seen for Robyn Hill gastrointestinal bleed.  You had an EGD which showed Robyn Hill bleeding angioectasia in the stomach which was treated.  Your colonoscopy showed polyps which were retrieved.   Gastroenterology will follow up with you regarding the pathology results.  You should hold your eliquis another 2 days.  Continue your proton pump inhibitor (dexilant).  You haven't required much insulin here, I don't think you need your insulin.  Follow up with your PCP outpatient.  Return for new, recurrent, or worsening symptoms.  Please ask your PCP to request records from this hospitalization so they know what was done and what the next steps will be.   Increase activity slowly   Complete by: As directed       Allergies as of 11/12/2023       Reactions   Tape Other (See Comments)   Pulls skin off, can use paper tape ONLY   Kiwi Extract Hives   Lyrica [pregabalin] Other (See Comments)   "Psychosis"   Sunflowerseed Oil Hives   Oxycodone Hcl Nausea Only   Hydrocodone-acetaminophen Nausea Only, Other (See Comments)   "Extended-release form only"   Invokana [canagliflozin] Other (See Comments)   UTI   Neurontin [gabapentin] Other (See Comments)   Intolerance/ineffective   Nitrofuran Derivatives Nausea Only, Other (See Comments)   stomach pain and weight loss        Medication List     STOP taking  these medications    celecoxib 200 MG capsule Commonly known as: CELEBREX   Toujeo SoloStar 300 UNIT/ML Solostar Pen Generic drug: insulin glargine (1 Unit Dial)       TAKE these medications    apixaban 5 MG Tabs tablet Commonly known as: Eliquis Take 1 tablet (5 mg total) by mouth 2 (two) times daily. Start taking on: November 15, 2023 What changed: These instructions start on November 15, 2023. If you are unsure what to do until then, ask your doctor or other care provider.   atorvastatin 80 MG tablet Commonly known as: LIPITOR Take 1 tablet (80 mg total) by mouth at bedtime.   brimonidine 0.2 % ophthalmic solution Commonly known as: ALPHAGAN Place 1 drop into both eyes 2 (two) times daily.   dexlansoprazole 60 MG capsule Commonly known as: DEXILANT TAKE ONE CAPSULE BY MOUTH TWICE DAILY   diltiazem 60 MG tablet Commonly known as: CARDIZEM Take 1 tablet (60 mg total) by mouth 3 (three) times daily. What changed: Another medication with the same name was removed. Continue taking  this medication, and follow the directions you see here.   dorzolamide-timolol 2-0.5 % ophthalmic solution Commonly known as: COSOPT Place 1 drop into both eyes 2 (two) times daily.   DULoxetine 60 MG capsule Commonly known as: Cymbalta Take 1 capsule (60 mg total) by mouth 2 (two) times daily. What changed: when to take this   Easy Touch Pen Needles 31G X 5 MM Misc Generic drug: Insulin Pen Needle USE AS DIRECTED TO INJECT TOUJEO DAILY   enalapril 20 MG tablet Commonly known as: VASOTEC TAKE 1 TABLET BY MOUTH DAILY   fluocinolone 0.01 % cream Apply topically at bedtime as needed. Use q 3 days at [place] Raden time for Itchy ears What changed:  how much to take reasons to take this additional instructions   FreeStyle Libre 3 Sensor Misc place 1 sensor EVERY 14 DAYS What changed:  how much to take how to take this when to take this additional instructions   hydrochlorothiazide 25 MG  tablet Commonly known as: HYDRODIURIL Take 1 tablet (25 mg total) by mouth daily.   hydrocortisone 25 MG suppository Commonly known as: ANUSOL-HC Place 1 suppository (25 mg total) rectally 2 (two) times daily. What changed:  when to take this reasons to take this   latanoprost 0.005 % ophthalmic solution Commonly known as: XALATAN Place 1 drop into both eyes at bedtime.   Linzess 72 MCG capsule Generic drug: linaclotide TAKE ONE CAPSULE BY MOUTH EVERY DAY BEFORE breakfast   MAGNESIUM GUMMIES PO Take 500 mg by mouth daily.   metFORMIN 500 MG tablet Commonly known as: GLUCOPHAGE TAKE 1 TABLET BY MOUTH TWICE DAILY WITH MEALS   morphine 15 MG tablet Commonly known as: MSIR Take 15 mg by mouth every 4 (four) hours.   nortriptyline 10 MG capsule Commonly known as: PAMELOR Take 10 mg by mouth daily as needed for sleep.   Procto-Med HC 2.5 % rectal cream Generic drug: hydrocortisone APPLY RECTALLY TO THE AFFECTED AREA(S) TWICE DAILY What changed: See the new instructions.   triamcinolone cream 0.1 % Commonly known as: KENALOG Apply 1 Application topically daily as needed (for itching- affected sites).   Trulicity 3 MG/0.5ML Soaj Generic drug: Dulaglutide INJECT 3 MG (1 pen) UNDER THE SKIN ONCE Atiyah Bauer WEEK What changed: See the new instructions.   Vitamin D3 125 MCG (5000 UT) Tabs Take 5,000 Units by mouth daily.   Vitamin D 50 MCG (2000 UT) Caps Take 1 capsule (2,000 Units total) by mouth daily.       Allergies  Allergen Reactions   Tape Other (See Comments)    Pulls skin off, can use paper tape ONLY   Kiwi Extract Hives   Lyrica [Pregabalin] Other (See Comments)    "Psychosis"   Sunflowerseed Oil Hives   Oxycodone Hcl Nausea Only   Hydrocodone-Acetaminophen Nausea Only and Other (See Comments)    "Extended-release form only"   Invokana [Canagliflozin] Other (See Comments)    UTI   Neurontin [Gabapentin] Other (See Comments)    Intolerance/ineffective    Nitrofuran Derivatives Nausea Only and Other (See Comments)    stomach pain and weight loss      The results of significant diagnostics from this hospitalization (including imaging, microbiology, ancillary and laboratory) are listed below for reference.    Significant Diagnostic Studies: No results found.  Microbiology: No results found for this or any previous visit (from the past 240 hours).   Labs: Basic Metabolic Panel: Recent Labs  Lab 11/09/23 1522 11/09/23 1628 11/10/23 9562  11/11/23 0444 11/12/23 0446  NA 137 139 140 139 135  K 3.8 3.9 3.7 3.8 3.6  CL 102 101 105 105 100  CO2 26  --  26 25 26   GLUCOSE 115* 109* 118* 122* 106*  BUN 18 17 16 10 11   CREATININE 0.87 0.80 0.63 0.70 0.65  CALCIUM 9.2  --  9.3 9.1 8.6*  MG  --   --   --  1.8 1.8  PHOS  --   --   --  4.3 4.5   Liver Function Tests: Recent Labs  Lab 11/09/23 1522 11/10/23 0458 11/11/23 0444 11/12/23 0446  AST 31 28 25  33  ALT 24 22 22 28   ALKPHOS 74 67 69 76  BILITOT 0.6 0.7 0.5 0.6  PROT 6.9 6.2* 6.6 6.4*  ALBUMIN 3.8 3.4* 3.4* 3.2*   No results for input(s): "LIPASE", "AMYLASE" in the last 168 hours. No results for input(s): "AMMONIA" in the last 168 hours. CBC: Recent Labs  Lab 11/09/23 1522 11/09/23 1628 11/10/23 0458 11/10/23 0811 11/10/23 1752 11/11/23 0444 11/12/23 0446  WBC 9.2  --  9.0  --   --  8.5 9.2  NEUTROABS  --   --   --   --   --  5.0 5.2  HGB 10.1*   < > 8.9* 10.3* 9.6* 9.3* 9.2*  HCT 32.0*   < > 29.1* 33.4* 30.9* 30.4* 29.8*  MCV 80.4  --  80.6  --   --  81.3 81.9  PLT 340  --  296  --   --  308 318   < > = values in this interval not displayed.   Cardiac Enzymes: No results for input(s): "CKTOTAL", "CKMB", "CKMBINDEX", "TROPONINI" in the last 168 hours. BNP: BNP (last 3 results) No results for input(s): "BNP" in the last 8760 hours.  ProBNP (last 3 results) No results for input(s): "PROBNP" in the last 8760 hours.  CBG: Recent Labs  Lab 11/11/23 1057  11/11/23 1635 11/11/23 2031 11/12/23 0743 11/12/23 1139  GLUCAP 109* 96 106* 106* 104*       Signed:  Lacretia Nicks MD.  [facility] 11/12/2023, 4:57 PM

## 2023-11-13 LAB — TYPE AND SCREEN
ABO/RH(D): B POS
Antibody Screen: NEGATIVE
Unit division: 0
Unit division: 0

## 2023-11-13 LAB — BPAM RBC
Blood Product Expiration Date: 202502172359
Blood Product Expiration Date: 202502202359
Unit Type and Rh: 5100
Unit Type and Rh: 7300

## 2023-11-15 ENCOUNTER — Encounter (HOSPITAL_COMMUNITY): Payer: Self-pay | Admitting: Gastroenterology

## 2023-11-15 ENCOUNTER — Telehealth: Payer: Self-pay

## 2023-11-15 LAB — SURGICAL PATHOLOGY

## 2023-11-15 NOTE — Anesthesia Postprocedure Evaluation (Signed)
Anesthesia Post Note  Patient: Robyn Hill  Procedure(s) Performed: ESOPHAGOGASTRODUODENOSCOPY (EGD) WITH PROPOFOL HOT HEMOSTASIS (ARGON PLASMA COAGULATION/BICAP)     Patient location during evaluation: PACU Anesthesia Type: MAC Level of consciousness: awake and alert Pain management: pain level controlled Vital Signs Assessment: post-procedure vital signs reviewed and stable Respiratory status: spontaneous breathing, nonlabored ventilation, respiratory function stable and patient connected to nasal cannula oxygen Cardiovascular status: stable and blood pressure returned to baseline Postop Assessment: no apparent nausea or vomiting Anesthetic complications: no   No notable events documented.  Last Vitals:  Vitals:   11/12/23 1439 11/12/23 1452  BP: (!) 108/39 (!) 114/54  Pulse: (!) 55 62  Resp: (!) 21   Temp:    SpO2: 97% 96%    Last Pain:  Vitals:   11/12/23 1452  TempSrc:   PainSc: 0-No pain                 Kennieth Rad

## 2023-11-15 NOTE — Transitions of Care (Post Inpatient/ED Visit) (Signed)
   11/15/2023  Name: Robyn Hill MRN: [id number] DOB: 1946/06/04  Today's TOC FU Call Status: Today's TOC FU Call Status:: Unsuccessful Call (1st Attempt) Unsuccessful Call (1st Attempt) Date: 11/15/23  Attempted to reach the patient regarding the most recent Inpatient/ED visit.  Follow Up Plan: Additional outreach attempts will be made to reach the patient to complete the Transitions of Care (Post Inpatient/ED visit) call.   Deidre Ala, BSN, RN [place]  [facility] - [employer] [facility] RN Care Manager [phone]

## 2023-11-16 ENCOUNTER — Telehealth: Payer: Self-pay | Admitting: *Deleted

## 2023-11-16 NOTE — Transitions of Care (Post Inpatient/ED Visit) (Signed)
   11/16/2023  Name: Robyn Hill MRN: [id number] DOB: 08-Mar-1946  Today's TOC FU Call Status: Today's TOC FU Call Status:: Unsuccessful Call (2nd Attempt) Unsuccessful Call (2nd Attempt) Date: 11/16/23  Attempted to reach the patient regarding the most recent Inpatient/ED visit.  Follow Up Plan: Additional outreach attempts will be made to reach the patient to complete the Transitions of Care (Post Inpatient/ED visit) call.   Gean Maidens BSN RN [facility] Care Management Coordinator [email or web address] Direct Dial: [phone]  Fax: [phone] Website: [email or web address]

## 2023-11-17 ENCOUNTER — Telehealth: Payer: Self-pay | Admitting: Medical

## 2023-11-17 ENCOUNTER — Telehealth: Payer: Self-pay

## 2023-11-17 NOTE — Telephone Encounter (Signed)
Pt asks if you can give her a call.

## 2023-11-17 NOTE — Transitions of Care (Post Inpatient/ED Visit) (Signed)
   11/17/2023  Name: Robyn Hill MRN: [id number] DOB: Dec 20, 1945  Today's TOC FU Call Status: Today's TOC FU Call Status:: Unsuccessful Call (3rd Attempt) Unsuccessful Call (3rd Attempt) Date: 11/17/23  Attempted to reach the patient regarding the most recent Inpatient/ED visit.  Follow Up Plan: No further outreach attempts will be made at this time. We have been unable to contact the patient.  Lonia Chimera, RN, BSN, CEN [employer]- Transition of Care Team.  [facility] [phone]

## 2023-11-18 NOTE — Telephone Encounter (Signed)
Pt scheduled a visit for hospital follow-up

## 2023-11-24 ENCOUNTER — Telehealth: Payer: 59 | Admitting: Medical

## 2023-11-30 ENCOUNTER — Telehealth: Payer: 59 | Admitting: Medical

## 2023-11-30 VITALS — Wt 166.5 lb

## 2023-11-30 DIAGNOSIS — I739 Peripheral vascular disease, unspecified: Secondary | ICD-10-CM

## 2023-11-30 DIAGNOSIS — K649 Unspecified hemorrhoids: Secondary | ICD-10-CM | POA: Insufficient documentation

## 2023-11-30 DIAGNOSIS — I6529 Occlusion and stenosis of unspecified carotid artery: Secondary | ICD-10-CM

## 2023-11-30 DIAGNOSIS — Z794 Long term (current) use of insulin: Secondary | ICD-10-CM

## 2023-11-30 DIAGNOSIS — D500 Iron deficiency anemia secondary to blood loss (chronic): Secondary | ICD-10-CM | POA: Diagnosis not present

## 2023-11-30 DIAGNOSIS — K922 Gastrointestinal hemorrhage, unspecified: Secondary | ICD-10-CM

## 2023-11-30 DIAGNOSIS — E11649 Type 2 diabetes mellitus with hypoglycemia without coma: Secondary | ICD-10-CM

## 2023-11-30 DIAGNOSIS — E162 Hypoglycemia, unspecified: Secondary | ICD-10-CM

## 2023-11-30 DIAGNOSIS — E118 Type 2 diabetes mellitus with unspecified complications: Secondary | ICD-10-CM

## 2023-11-30 MED ORDER — TOUJEO SOLOSTAR 300 UNIT/ML ~~LOC~~ SOPN: 20.0000 [IU] | PEN_INJECTOR | Freq: Every day | SUBCUTANEOUS | 2 refills | Status: DC

## 2023-11-30 MED ORDER — DULOXETINE HCL 60 MG PO CPEP: 60.0000 mg | ORAL_CAPSULE | Freq: Two times a day (BID) | ORAL | 3 refills | Status: DC

## 2023-11-30 NOTE — Addendum Note (Signed)
Addended by: Herminio Commons A on: 11/30/2023 02:12 PM   Modules accepted: Orders

## 2023-11-30 NOTE — Addendum Note (Signed)
Addended by: Herminio Commons A on: 11/30/2023 02:20 PM   Modules accepted: Orders

## 2023-11-30 NOTE — Addendum Note (Signed)
Addended by: Herminio Commons A on: 11/30/2023 02:26 PM   Modules accepted: Orders

## 2023-11-30 NOTE — Addendum Note (Signed)
Addended by: Herminio Commons A on: 11/30/2023 02:21 PM   Modules accepted: Orders

## 2023-11-30 NOTE — Progress Notes (Signed)
 This visit type was conducted due to national recommendations for restrictions regarding the COVID-19 Pandemic (e.g. social distancing) in an effort to limit this patient's exposure and mitigate transmission in our community.  Due to their co-morbid illnesses, this patient is at least at moderate risk for complications without adequate follow up.  This format is felt to be most appropriate for this patient at this time.    Documentation for virtual audio and video telecommunications through [facility] encounter:  The patient was located at home. The provider was located in the office. The patient did consent to this visit and is aware of possible charges through their insurance for this visit.  The other persons participating in this telemedicine service were none. Time spent on call was 20 minutes and in review of previous records >30 minutes total.   This virtual service is not related to other E/M service within previous 7 days.    Subjective: Chief Complaint  Patient presents with   Hospitalization Follow-up    Hospital follow-up, doing well, not seeing any blood   Virtual consult regarding hospital follow-up.  She was hospitalized January 21 through November 12, 2023  Discharge Diagnoses:  Principal Problem:   Acute lower GI bleeding Active Problems:   Chronic lower GI bleeding   Essential hypertension   Internal hemorrhoid   Gastroesophageal reflux disease without esophagitis   Paroxysmal A-fib (HCC)   Non-insulin dependent type 2 diabetes mellitus (HCC)   Bilateral carotid artery stenosis   Chronic pain syndrome   External hemorrhoid   History of CAD (coronary artery disease)   Hyperlipidemia   GAD (generalized anxiety disorder)  Per hospital discharge summary records: Acute lower GI bleed History of intermittent hemorrhoidal bleeding History of internal/external hemorrhoid - Patient presenting with intermittent bleeding during bowel movement for last 2 weeks and  she noticed 1 episodes of black tarry stool few days ago.  she has history of chronic intermittent bleeding with bowel movement however this is more persistent than typical.  Rectal exam was performed by ED physician which showed external hemorrhoid. 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  Diverticulosis, internal hemorrhoids.  See reports. Follow biopsies. -- Per discussion with GI, ok to resume eliquis after 2 days, ok to discharge from GI standpoint.  Continue PPI daily x4 weeks (she's already on chronic dexilant).    Currently she feels fine.  She has had no bleeding since she can the hospital thankfully.  She is taking iron supplement once daily  She has an appointment to follow-up with gastro that she saw in the hospital  She is going for blood work in 2 days per cardiology  Her only concern is her sugars have been running low at night still.  In recent months she was on 50 units of Toujeo along with Trulicity and metformin 500 twice daily.  But lately she has only been using 7 to 10 units of Toujeo given the low readings and has actually not used any Toujeo in the last few days due to some low readings at night.  Sugars in the daytime typically are between 140-160 including before bed, but she occasionally still sees a low reading under  60 at night or early morning.  Her last Trulicity dose was a week ago.  Celebrex was discontinued at the hospital and she has not resumed this  She is back on her Eliquis blood thinner  No other aggravating or relieving factors. No other complaint.  Past Medical History:  Diagnosis Date   Adrenal  mass, right (HCC)    Allergy    Anemia    Asthma    "as a teenager"   Blind right eye    since childhood   Blood transfusion 1970   Chronic back pain    Chronic diarrhea    Colestipol therapy   Depression    Diverticulitis    Diverticulosis    Fatty liver    Female bladder prolapse    GERD (gastroesophageal reflux disease)    Glaucoma    H/O bone density study 11/29/2014   normal study although mild decreased in density from prior study   H/O hiatal hernia    Hemorrhoids    History of kidney stones    History of uterine cancer    s/p hysterectomy   Hyperlipidemia    Hypertension    Migraine    hx of migraines in past    Neuromuscular disorder (HCC)    diabetic neuropahthy   Numbness and tingling of both legs    Osteoarthritis    Rheumatic fever    age 33   Type II diabetes mellitus (HCC)    Urinary incontinence    Vitamin D deficiency    Current Outpatient Medications on File Prior to Visit  Medication Sig Dispense Refill   apixaban (ELIQUIS) 5 MG TABS tablet Take 1 tablet (5 mg total) by mouth 2 (two) times daily.     atorvastatin (LIPITOR) 80 MG tablet Take 1 tablet (80 mg total) by mouth at bedtime.     brimonidine (ALPHAGAN) 0.2 % ophthalmic solution Place 1 drop into both eyes 2 (two) times daily.     Cholecalciferol (VITAMIN D) 50 MCG (2000 UT) CAPS Take 1 capsule (2,000 Units total) by mouth daily. 90 capsule 3   Continuous Glucose Sensor (FREESTYLE LIBRE 3 SENSOR) MISC place 1 sensor EVERY 14 DAYS (Patient taking differently: Inject 1 Device into the skin every 14 (fourteen) days.) 2 each 5   dexlansoprazole (DEXILANT) 60 MG capsule TAKE ONE CAPSULE BY MOUTH TWICE DAILY (Patient taking differently: Take 60 mg by mouth 2 (two) times daily.) 180 capsule 1   diltiazem (CARDIZEM) 60 MG tablet Take 1 tablet (60 mg total) by mouth 3 (three) times daily. 90 tablet 5   dorzolamide-timolol (COSOPT) 22.3-6.8 MG/ML ophthalmic solution Place 1 drop into both eyes 2 (two)  times daily.     fluocinolone 0.01 % cream Apply topically at bedtime as needed. Use q 3 days at a time for Itchy ears (Patient taking differently: Apply 1 application  topically at bedtime as needed (to affected areas of the legs and arms).) 30 g 0   hydrochlorothiazide (HYDRODIURIL) 25 MG tablet Take 1 tablet (25 mg total) by mouth daily. 90 tablet 1   hydrocortisone (ANUSOL-HC) 25 MG suppository Place 1 suppository (25 mg total) rectally 2 (two) times daily. (Patient taking differently: Place 25 mg rectally 2 (two) times daily as needed for hemorrhoids.) 40 suppository 1   latanoprost (XALATAN) 0.005 % ophthalmic solution Place 1 drop into both eyes at bedtime.     LINZESS 72 MCG capsule TAKE ONE CAPSULE BY MOUTH EVERY DAY BEFORE breakfast (Patient taking differently: Take  72 mcg by mouth.) 90 capsule 2   Magnesium Citrate (MAGNESIUM GUMMIES PO) Take 500 mg by mouth daily.     metFORMIN (GLUCOPHAGE) 500 MG tablet TAKE 1 TABLET BY MOUTH TWICE DAILY WITH MEALS 180 tablet 1   morphine (MSIR) 15 MG tablet Take 15 mg by mouth every 4 (four) hours.     nortriptyline (PAMELOR) 10 MG capsule Take 10 mg by mouth daily as needed for sleep.     triamcinolone cream (KENALOG) 0.1 % Apply 1 Application topically daily as needed (for itching- affected sites).     EASY TOUCH PEN NEEDLES 31G X 5 MM MISC USE AS DIRECTED TO INJECT TOUJEO DAILY 100 each 0   enalapril (VASOTEC) 20 MG tablet TAKE 1 TABLET BY MOUTH DAILY (Patient not taking: Reported on 11/30/2023) 90 tablet 1   No current facility-administered medications on file prior to visit.   The following portions of the patient's history were reviewed and updated as appropriate: allergies, current medications, past family history, past medical history, past social history, past surgical history and problem list.  ROS Otherwise as in subjective above   Objective: Wt 166 lb 8 oz (75.5 kg)   BMI 28.58 kg/m  Not examined in person as this is a virtual  consult General appearance: alert, no distress, well developed, well nourished    Assessment: Encounter Diagnoses  Name Primary?   Gastrointestinal hemorrhage, unspecified gastrointestinal hemorrhage type Yes   Hemorrhoids, unspecified hemorrhoid type    Hypoglycemia    Blood loss anemia    Diabetes mellitus with complication (HCC)      Plan: GI hemorrhage, hemorrhoids, blood loss anemia I reviewed her recent hospital discharge summary medicines were reconciled.  I reviewed imaging and labs.  Thankfully no additional bleeding since the hospital visit.  She is feeling okay.  She will go to [facility] in the next 2 days for blood work and she is due for some repeat blood work  Continue iron daily.  Do not take any more NSAIDs or Celebrex  Continue Eliquis  Follow-up with gastroenterology soon as planned  Regarding her diabetes, she is still seeing some low readings so we will continue metformin 500 mg twice daily.  We will stop Trulicity for now.  We will have her do Toujeo 4 units nightly for 4 days in a row along with monitoring blood sugars.  If numbers are not to goal such as under 140 fasting and if nighttime sugars are not dropping too low then we will go up 2 units at a time every 4 days.  I asked her to call back or MyChart message within the next 2 weeks to give an update on her blood sugars any low readings    Robyn Hill was seen today for hospitalization follow-up.  Diagnoses and all orders for this visit:  Gastrointestinal hemorrhage, unspecified gastrointestinal hemorrhage type  Hemorrhoids, unspecified hemorrhoid type  Hypoglycemia  Blood loss anemia -     Comprehensive metabolic panel; Future -     CBC; Future -     Iron, TIBC and Ferritin Panel; Future  Diabetes mellitus with complication (HCC) -     insulin glargine, 1 Unit Dial, (TOUJEO SOLOSTAR) 300 UNIT/ML Solostar Pen; Inject 20 Units into the skin daily.  Other orders -     DULoxetine  (CYMBALTA) 60 MG capsule; Take 1 capsule (60 mg total) by mouth 2 (two) times daily.    Spent > 30 minutes face to face with patient in discussion  of symptoms, evaluation, plan and recommendations.    Follow up: Call report within 2 weeks

## 2023-12-01 ENCOUNTER — Other Ambulatory Visit: Payer: Self-pay | Admitting: Cardiology

## 2023-12-02 ENCOUNTER — Other Ambulatory Visit (HOSPITAL_COMMUNITY)
Admission: RE | Admit: 2023-12-02 | Discharge: 2023-12-02 | Disposition: A | Discharge status: Home / Self Care | Payer: 59 | Source: Ambulatory Visit | Attending: Nurse Practitioner | Admitting: Nurse Practitioner

## 2023-12-02 ENCOUNTER — Other Ambulatory Visit (HOSPITAL_COMMUNITY)
Admission: RE | Admit: 2023-12-02 | Discharge: 2023-12-02 | Disposition: A | Discharge status: Home / Self Care | Payer: 59 | Source: Ambulatory Visit | Attending: Medical | Admitting: Medical

## 2023-12-02 ENCOUNTER — Encounter: Payer: Self-pay | Admitting: Internal Medicine

## 2023-12-02 ENCOUNTER — Other Ambulatory Visit: Payer: Self-pay | Admitting: Medical

## 2023-12-02 DIAGNOSIS — K922 Gastrointestinal hemorrhage, unspecified: Secondary | ICD-10-CM | POA: Insufficient documentation

## 2023-12-02 DIAGNOSIS — D500 Iron deficiency anemia secondary to blood loss (chronic): Secondary | ICD-10-CM | POA: Insufficient documentation

## 2023-12-02 DIAGNOSIS — I739 Peripheral vascular disease, unspecified: Secondary | ICD-10-CM

## 2023-12-02 DIAGNOSIS — I6529 Occlusion and stenosis of unspecified carotid artery: Secondary | ICD-10-CM | POA: Insufficient documentation

## 2023-12-02 DIAGNOSIS — H905 Unspecified sensorineural hearing loss: Secondary | ICD-10-CM | POA: Diagnosis not present

## 2023-12-02 LAB — LIPID PANEL
Cholesterol: 126 mg/dL (ref 0–200)
HDL: 58 mg/dL (ref >40)
LDL Cholesterol: 51 mg/dL (ref 0–99)
Total CHOL/HDL Ratio: 2.2 {ratio}
Triglycerides: 83 mg/dL (ref <150)
VLDL: 17 mg/dL (ref 0–40)

## 2023-12-02 LAB — IRON AND TIBC
Iron: 30 ug/dL (ref 28–170)
Saturation Ratios: 6 % — ABNORMAL LOW (ref 10.4–31.8)
TIBC: 471 ug/dL — ABNORMAL HIGH (ref 250–450)
UIBC: 441 ug/dL

## 2023-12-02 LAB — COMPREHENSIVE METABOLIC PANEL
ALT: 25 U/L (ref 0–44)
AST: 29 U/L (ref 15–41)
Albumin: 4.1 g/dL (ref 3.5–5.0)
Alkaline Phosphatase: 97 U/L (ref 38–126)
Anion gap: 11 (ref 5–15)
BUN: 18 mg/dL (ref 8–23)
CO2: 30 mmol/L (ref 22–32)
Calcium: 9.5 mg/dL (ref 8.9–10.3)
Chloride: 98 mmol/L (ref 98–111)
Creatinine, Ser: 0.79 mg/dL (ref 0.44–1.00)
GFR, Estimated: >60 mL/min (ref >60)
Glucose, Bld: 110 mg/dL — ABNORMAL HIGH (ref 70–99)
Potassium: 3.7 mmol/L (ref 3.5–5.1)
Sodium: 139 mmol/L (ref 135–145)
Total Bilirubin: 0.7 mg/dL (ref 0.0–1.2)
Total Protein: 7.8 g/dL (ref 6.5–8.1)

## 2023-12-02 LAB — HEPATIC FUNCTION PANEL
ALT: 24 U/L (ref 0–44)
AST: 28 U/L (ref 15–41)
Albumin: 4 g/dL (ref 3.5–5.0)
Alkaline Phosphatase: 95 U/L (ref 38–126)
Bilirubin, Direct: 0.1 mg/dL (ref 0.0–0.2)
Indirect Bilirubin: 0.6 mg/dL (ref 0.3–0.9)
Total Bilirubin: 0.7 mg/dL (ref 0.0–1.2)
Total Protein: 7.8 g/dL (ref 6.5–8.1)

## 2023-12-02 LAB — CBC
HCT: 35.6 % — ABNORMAL LOW (ref 36.0–46.0)
Hemoglobin: 11 g/dL — ABNORMAL LOW (ref 12.0–15.0)
MCH: 24.5 pg — ABNORMAL LOW (ref 26.0–34.0)
MCHC: 30.9 g/dL (ref 30.0–36.0)
MCV: 79.3 fL — ABNORMAL LOW (ref 80.0–100.0)
Platelets: 356 10*3/uL (ref 150–400)
RBC: 4.49 MIL/uL (ref 3.87–5.11)
RDW: 13.2 % (ref 11.5–15.5)
WBC: 10.1 10*3/uL (ref 4.0–10.5)
nRBC: 0 % (ref 0.0–0.2)

## 2023-12-02 LAB — FERRITIN: Ferritin: 5 ng/mL — ABNORMAL LOW (ref 11–307)

## 2023-12-02 NOTE — Progress Notes (Signed)
Iron levels are still pretty low, electrolytes and kidney okay.  I recommend either adding an iron supplement daily for 1 to 2 months such as ferrous gluconate or we could get her set up for an iron infusion x 1 to help boost up the iron quicker to help her hemoglobin and blood counts rebounded from the recent blood loss  Oral iron includes things like Fergon or tandem iron polysaccharide.  Sometimes oral iron can cause constipation or upset stomach.  Laurey Morale is cheap and most people tolerated okay but some people get constipation with it.  If we do the iron infusion that may be enough to not have to take oral iron but it does require an IV infusion at either one of the local hospitals

## 2023-12-08 ENCOUNTER — Telehealth: Payer: Self-pay

## 2023-12-08 NOTE — Telephone Encounter (Unsigned)
 Copied from CRM [id number]. Topic: Appointments - Scheduling Inquiry for Clinic >> Dec 08, 2023  1:13 PM Prudencio Pair wrote: Reason for CRM: Patient's daughter, Babette Relic, calling to schedule her mother's iron infusion. Please give her a call back to schedule. CB #: [phone].

## 2023-12-09 ENCOUNTER — Encounter: Payer: Self-pay | Admitting: Internal Medicine

## 2023-12-09 DIAGNOSIS — D500 Iron deficiency anemia secondary to blood loss (chronic): Secondary | ICD-10-CM

## 2023-12-09 DIAGNOSIS — E118 Type 2 diabetes mellitus with unspecified complications: Secondary | ICD-10-CM

## 2023-12-09 DIAGNOSIS — R5382 Chronic fatigue, unspecified: Secondary | ICD-10-CM

## 2023-12-09 DIAGNOSIS — M1990 Unspecified osteoarthritis, unspecified site: Secondary | ICD-10-CM

## 2023-12-10 ENCOUNTER — Other Ambulatory Visit: Payer: Self-pay | Admitting: Medical

## 2023-12-10 MED ORDER — TRULICITY 3 MG/0.5ML ~~LOC~~ SOAJ: 3.0000 mg | SUBCUTANEOUS | 2 refills | Status: DC

## 2023-12-15 ENCOUNTER — Encounter (HOSPITAL_COMMUNITY): Payer: 59

## 2023-12-20 DIAGNOSIS — M961 Postlaminectomy syndrome, not elsewhere classified: Secondary | ICD-10-CM | POA: Diagnosis not present

## 2023-12-20 DIAGNOSIS — G629 Polyneuropathy, unspecified: Secondary | ICD-10-CM | POA: Diagnosis not present

## 2023-12-21 ENCOUNTER — Telehealth: Payer: Self-pay | Admitting: Nurse Practitioner

## 2023-12-21 NOTE — Telephone Encounter (Signed)
  Patient Consent for Virtual Visit        Robyn Hill has provided verbal consent on 12/21/2023 for a virtual visit (video or telephone).   CONSENT FOR VIRTUAL VISIT FOR:  Robyn Hill  By participating in this virtual visit I agree to the following:  I hereby voluntarily request, consent and authorize [facility] and its employed or contracted physicians, physician assistants, nurse practitioners or other licensed health care professionals (the Practitioner), to provide me with telemedicine health care services (the "Services") as deemed necessary by the treating Practitioner. I acknowledge and consent to receive the Services by the Practitioner via telemedicine. I understand that the telemedicine visit will involve communicating with the Practitioner through live audiovisual communication technology and the disclosure of certain medical information by electronic transmission. I acknowledge that I have been given the opportunity to request an in-person assessment or other available alternative prior to the telemedicine visit and am voluntarily participating in the telemedicine visit.  I understand that I have the right to withhold or withdraw my consent to the use of telemedicine in the course of my care at any time, without affecting my right to future care or treatment, and that the Practitioner or I may terminate the telemedicine visit at any time. I understand that I have the right to inspect all information obtained and/or recorded in the course of the telemedicine visit and may receive copies of available information for a reasonable fee.  I understand that some of the potential risks of receiving the Services via telemedicine include:  Delay or interruption in medical evaluation due to technological equipment failure or disruption; Information transmitted may not be sufficient (e.g. poor resolution of images) to allow for appropriate medical decision making by the Practitioner;  and/or  In rare instances, security protocols could fail, causing a breach of personal health information.  Furthermore, I acknowledge that it is my responsibility to provide information about my medical history, conditions and care that is complete and accurate to the best of my ability. I acknowledge that Practitioner's advice, recommendations, and/or decision may be based on factors not within their control, such as incomplete or inaccurate data provided by me or distortions of diagnostic images or specimens that may result from electronic transmissions. I understand that the practice of medicine is not an exact science and that Practitioner makes no warranties or guarantees regarding treatment outcomes. I acknowledge that a copy of this consent can be made available to me via my patient portal [facility] MyChart), or I can request a printed copy by calling the office of [facility].    I understand that my insurance will be billed for this visit.   I have read or had this consent read to me. I understand the contents of this consent, which adequately explains the benefits and risks of the Services being provided via telemedicine.  I have been provided ample opportunity to ask questions regarding this consent and the Services and have had my questions answered to my satisfaction. I give my informed consent for the services to be provided through the use of telemedicine in my medical care

## 2023-12-30 ENCOUNTER — Ambulatory Visit: Payer: 59 | Attending: Nurse Practitioner | Admitting: Nurse Practitioner

## 2023-12-30 ENCOUNTER — Telehealth: Payer: Self-pay | Admitting: Nurse Practitioner

## 2023-12-30 ENCOUNTER — Encounter: Payer: Self-pay | Admitting: Nurse Practitioner

## 2023-12-30 VITALS — BP 147/78 | HR 75 | Ht 64.0 in | Wt 174.0 lb

## 2023-12-30 DIAGNOSIS — I10 Essential (primary) hypertension: Secondary | ICD-10-CM | POA: Diagnosis not present

## 2023-12-30 DIAGNOSIS — K625 Hemorrhage of anus and rectum: Secondary | ICD-10-CM | POA: Diagnosis not present

## 2023-12-30 DIAGNOSIS — I272 Pulmonary hypertension, unspecified: Secondary | ICD-10-CM

## 2023-12-30 DIAGNOSIS — I480 Paroxysmal atrial fibrillation: Secondary | ICD-10-CM

## 2023-12-30 DIAGNOSIS — I6529 Occlusion and stenosis of unspecified carotid artery: Secondary | ICD-10-CM | POA: Diagnosis not present

## 2023-12-30 DIAGNOSIS — I38 Endocarditis, valve unspecified: Secondary | ICD-10-CM | POA: Diagnosis not present

## 2023-12-30 NOTE — Patient Instructions (Addendum)

## 2023-12-30 NOTE — Telephone Encounter (Signed)
  Patient Consent for Virtual Visit        Robyn Hill has provided verbal consent on 12/30/2023 for a virtual visit (video or telephone).   CONSENT FOR VIRTUAL VISIT FOR:  Robyn Hill  By participating in this virtual visit I agree to the following:  I hereby voluntarily request, consent and authorize [facility] and its employed or contracted physicians, physician assistants, nurse practitioners or other licensed health care professionals (the Practitioner), to provide me with telemedicine health care services (the "Services") as deemed necessary by the treating Practitioner. I acknowledge and consent to receive the Services by the Practitioner via telemedicine. I understand that the telemedicine visit will involve communicating with the Practitioner through live audiovisual communication technology and the disclosure of certain medical information by electronic transmission. I acknowledge that I have been given the opportunity to request an in-person assessment or other available alternative prior to the telemedicine visit and am voluntarily participating in the telemedicine visit.  I understand that I have the right to withhold or withdraw my consent to the use of telemedicine in the course of my care at any time, without affecting my right to future care or treatment, and that the Practitioner or I may terminate the telemedicine visit at any time. I understand that I have the right to inspect all information obtained and/or recorded in the course of the telemedicine visit and may receive copies of available information for a reasonable fee.  I understand that some of the potential risks of receiving the Services via telemedicine include:  Delay or interruption in medical evaluation due to technological equipment failure or disruption; Information transmitted may not be sufficient (e.g. poor resolution of images) to allow for appropriate medical decision making by the Practitioner;  and/or  In rare instances, security protocols could fail, causing a breach of personal health information.  Furthermore, I acknowledge that it is my responsibility to provide information about my medical history, conditions and care that is complete and accurate to the best of my ability. I acknowledge that Practitioner's advice, recommendations, and/or decision may be based on factors not within their control, such as incomplete or inaccurate data provided by me or distortions of diagnostic images or specimens that may result from electronic transmissions. I understand that the practice of medicine is not an exact science and that Practitioner makes no warranties or guarantees regarding treatment outcomes. I acknowledge that a copy of this consent can be made available to me via my patient portal [facility] MyChart), or I can request a printed copy by calling the office of [facility].    I understand that my insurance will be billed for this visit.   I have read or had this consent read to me. I understand the contents of this consent, which adequately explains the benefits and risks of the Services being provided via telemedicine.  I have been provided ample opportunity to ask questions regarding this consent and the Services and have had my questions answered to my satisfaction. I give my informed consent for the services to be provided through the use of telemedicine in my medical care

## 2023-12-30 NOTE — Progress Notes (Unsigned)
 Cardiology Office Note:  .   Date:  10/01/2023 ID:  Robyn Hill, DOB Jun 15, 1946, MRN [id number] PCP: Genia Del  [facility] Providers Cardiologist:  Marjo Bicker, MD    History of Present Illness: .   Robyn Hill is a 78 y.o. female with a PMH of A-fib, carotid artery stenosis, cardiac murmur, HTN, GERD, type 2 diabetes, arthritis, chronic back pain, history of thyroid disease, anemia of chronic disease, fatigue, who presents today for A-fib follow-up.  Last seen by Dr. Antoine Poche on January 13, 2022 for evaluation for possible A-fib.  It was stated this is possibly seen on EKG at PCPs office, however Dr. Antoine Poche felt that this was PAT.  Patient wore a monitor that did not reveal A-fib.  Her Apple Watch was reviewed and was felt that the rhythm was A-fib.  Patient noted fatigue at the time, heart rate ranging 50-140s.  She was started on Eliquis due to CHA2DS2-VASc score of 4.  She was also started on low-dose metoprolol 12.5 mg twice daily.  Echocardiogram was arranged -see report below.  I last saw her for office follow-up on August 17, 2023.  She presented her readings from her Apple Watch that showed A-fib readings with the most recent readings from 08/09/2023 that were overall well controlled HR in A-fib. Stated she couldn't tell when she is in A-fib, asymptomatic, just noticed this on her Apple Watch. Patient endorsed some fatigue. Denied any chest pain, shortness of breath, palpitations, syncope, presyncope, dizziness, orthopnea, PND, swelling or significant weight changes, acute bleeding, or claudication. Was requesting to see a new cardiologist. Patient's daughter stated she previously wore a cardiac monitor, could not tolerate due to allergy, had to take off early. Daughter also presented concern regarding patient's previous carotid doppler report.    Change to telephone visit --  12/30/2023 -   Doinmg well. Having some bleedimg, gi referral, will be  following up. Hemorrhoids.    Heart standpoint fine, A-fib episodes, nothing to worry about. No symptoms.   BP - 147/78  75 pulse    ROS: Negative. See HPI.   Studies Reviewed: .    Echo 08/2023:  1. Left ventricular ejection fraction, by estimation, is 60 to 65%. The  left ventricle has normal function. The left ventricle has no regional  wall motion abnormalities. There is mild left ventricular hypertrophy.  Left ventricular diastolic parameters  are indeterminate.   2. Right ventricular systolic function is normal. The right ventricular  size is normal. There is moderately elevated pulmonary artery systolic  pressure.   3. The mitral valve is abnormal. Mild mitral valve regurgitation. No  evidence of mitral stenosis.   4. The tricuspid valve is abnormal.   5. The aortic valve is tricuspid. Aortic valve regurgitation is not  visualized. No aortic stenosis is present.   6. The inferior vena cava is normal in size with greater than 50%  respiratory variability, suggesting right atrial pressure of 3 mmHg.   Comparison(s): EF 65-70%. Mild LVH. RV size mildly increased. Moderate TR-RVSP 46.6 mmHG. Mild MR.  Carotid duplex 08/2023:  Summary:  Right Carotid: Velocities in the right ICA are consistent with a 60-79%  stenosis. Non-hemodynamically significant plaque <50% noted  in the CCA. The ECA appears >50% stenosed.   Left Carotid: Velocities in the left ICA are consistent with a 1-39%  stenosis. Non-hemodynamically significant plaque <50% noted in the  CCA. The ECA appears <50% stenosed.   *See table(s) above  for measurements and observations.  Suggest follow up study in 12 months.    Electronically signed by Lorine Bears MD on 09/09/2023 at 1:22:40 PM.  Carotid duplex 09/2022:  Summary:  Right Carotid: Velocities in the right ICA are consistent with a 60-79% stenosis. Non-hemodynamically significant plaque <50% noted in the CCA. The ECA appears >50% stenosed.    Left Carotid: Velocities in the left ICA are consistent with a 1-39%  stenosis. Non-hemodynamically significant plaque <50% noted in the  CCA.   Vertebrals: Bilateral vertebral arteries demonstrate antegrade flow.  Subclavians: Bilateral subclavian artery flow was disturbed.   *See table(s) above for measurements and observations.  Suggest follow up study in 12 months.  Echo 01/2022:  IMPRESSIONS    1. Left ventricular ejection fraction, by estimation, is 65 to 70%. The  left ventricle has normal function. The left ventricle has no regional  wall motion abnormalities. There is mild left ventricular hypertrophy.  Left ventricular diastolic parameters  were normal.   2. Right ventricular systolic function is low normal. The right  ventricular size is mildly enlarged. There is moderately elevated  pulmonary artery systolic pressure. The estimated right ventricular  systolic pressure is 46.6 mmHg.   3. Left atrial size was mildly dilated.   4. The mitral valve is grossly normal. Mild mitral valve regurgitation.   5. Tricuspid valve regurgitation is moderate.   6. The aortic valve is tricuspid. There is mild calcification of the  aortic valve. Aortic valve regurgitation is not visualized. Aortic valve  sclerosis/calcification is present, without any evidence of aortic  stenosis. Aortic valve mean gradient  measures 3.0 mmHg.   7. The inferior vena cava is normal in size with greater than 50%  respiratory variability, suggesting right atrial pressure of 3 mmHg.   Comparison(s): No prior Echocardiogram.   Monitor 12/2021:  Normal sinus rhythm is the predominant rhythm Rare ectopy. Blocked PACs First degree AV block No sustained arrhythmias  Lexiscan 05/2016:  Nuclear stress EF: 67%. The left ventricular ejection fraction is normal (55-65%). There was no ST segment deviation noted during stress. No T wave inversion was noted during stress. Defect 1: There is a small defect of  mild severity present in the mid anterior location. The study is normal. This is a low risk study.   Low risk stress nuclear study with normal perfusion and normal left ventricular regional and global systolic function. Mild breast attenuation artifact is seen.   Risk Assessment/Calculations:    CHA2DS2-VASc Score = 5  } This indicates a 7.2% annual risk of stroke. The patient's score is based upon: CHF History: 0 HTN History: 1 Diabetes History: 1 Stroke History: 0 Vascular Disease History: 0 Age Score: 2 Gender Score: 1   STOP-Bang Score:  3  {  The ASCVD Risk score (Arnett DK, et al., 2019) failed to calculate for the following reasons:   The valid total cholesterol range is 130 to 320 mg/dL  Physical Exam:   VS:  There were no vitals taken for this visit.   Wt Readings from Last 3 Encounters:  11/30/23 166 lb 8 oz (75.5 kg)  11/10/23 179 lb 3.7 oz (81.3 kg)  11/09/23 181 lb 9.6 oz (82.4 kg)    GEN: Well nourished, well developed in no acute distress NECK: No JVD; No carotid bruits CARDIAC: S1/S2, RRR, no murmurs, rubs, gallops RESPIRATORY:  Clear to auscultation without rales, wheezing or rhonchi  EXTREMITIES:  No edema; No deformity   ASSESSMENT AND PLAN: .  PAF  Denies any recent tachycardia or palpitations. She has noticed some episodes of A-fib, A-fib HR is well controlled. Pt is overall asymptomatic with her A-fib, does appear she has chronic fatigue with etiology unclear/multifactorial. She is not in A-fib today. Pt previously wore monitor, however she believes monitor was thrown out. Continue Eliquis 5 mg twice daily due to CHA2DS2-VASc score of 5.  She is on appropriate dosage, denies any bleeding issues.  Heart healthy diet and regular cardiovascular exercise encouraged.   Carotid artery stenosis, PAD Most recent doppler revealed stable readings from previous year, RICA stenosis at 60-79% with LICA stenosis at 1-39%. Not on aspirin d/t being on Eliquis.  Will increase atorvastatin to 80 mg daily to lower LDL < 55. Will arrange FLP/LFT in 2 months per protocol. Heart healthy diet and regular cardiovascular exercise encouraged.   HTN Blood pressure elevated on arrival, repeat BP WNL. Discussed to monitor BP at home at least 2 hours after medications and sitting for 5-10 minutes.  No medication changes at this time. Heart healthy diet and regular cardiovascular exercise encouraged.   Valvular insufficiency Most recent Echo revealed mild tricuspid regurgitation, mild MR. Recommend to update Echo in 3-5 years or sooner if clinically indicated.   4. Pulmonary hypertension Recent Echo revealed stable moderately elevated PASP. Etiology unclear, wonder if patient has OSA. Patient is overall asymptomatic, notes some chronic fatigue-see below. Referring to Pulmonology for OSA evaluation.  Continue current medication regimen.  Continue follow-up with PCP.  Care and ED precautions discussed.  Fatigue Appears that this is chronic.  Etiology multifactorial. Does show some signs/symptoms of OSA, referring to Pulmonology for further evaluation.  Continue follow-up with PCP.   Dispo: Follow-up with me/[facility] in 3 months or sooner if anything changes.   Signed, Sharlene Dory, NP

## 2024-01-02 ENCOUNTER — Other Ambulatory Visit: Payer: Self-pay | Admitting: Medical

## 2024-01-03 ENCOUNTER — Other Ambulatory Visit: Payer: Self-pay | Admitting: Medical

## 2024-01-04 ENCOUNTER — Encounter: Payer: Self-pay | Admitting: Internal Medicine

## 2024-01-04 NOTE — Telephone Encounter (Signed)
Pt needs this refilled

## 2024-01-05 ENCOUNTER — Other Ambulatory Visit: Payer: Self-pay | Admitting: Medical

## 2024-01-05 MED ORDER — FERROUS GLUCONATE 324 (38 FE) MG PO TABS: 324.0000 mg | ORAL_TABLET | Freq: Two times a day (BID) | ORAL | 3 refills | Status: DC

## 2024-01-12 ENCOUNTER — Telehealth: Payer: Self-pay | Admitting: Internal Medicine

## 2024-01-12 NOTE — Telephone Encounter (Signed)
    Primary Cardiologist: Marjo Bicker, MD  Chart reviewed as part of pre-operative protocol coverage. Simple dental extractions are considered low risk procedures per guidelines and generally do not require any specific cardiac clearance. It is also generally accepted that for simple extractions and dental cleanings, there is no need to interrupt blood thinner therapy.   SBE prophylaxis is not required for the patient.  I will route this recommendation to the requesting party via Epic fax function and remove from pre-op pool.  Please call with questions.  Ronney Asters, NP 01/12/2024, 12:05 PM

## 2024-01-12 NOTE — Telephone Encounter (Signed)
 What dental office are you calling from? Dr. Saul Fordyce, DDS  What is your office phone number? [phone]   What is your fax number? [phone]  What type of procedure is the patient having performed? filling   What date is procedure scheduled or is the patient there now? tbd  (if the patient is at the dentist's office question goes to their cardiologist if he/she is in the office.  If not, question should go to the DOD).   What is your question (ex. Antibiotics prior to procedure, holding medication-we need to know how long dentist wants pt to hold med)? Does pt need antibiotics

## 2024-01-24 ENCOUNTER — Telehealth: Payer: Self-pay | Admitting: Medical

## 2024-01-24 ENCOUNTER — Other Ambulatory Visit (HOSPITAL_COMMUNITY)
Admission: RE | Admit: 2024-01-24 | Discharge: 2024-01-24 | Disposition: A | Discharge status: Home / Self Care | Source: Ambulatory Visit | Attending: Medical | Admitting: Medical

## 2024-01-24 DIAGNOSIS — D500 Iron deficiency anemia secondary to blood loss (chronic): Secondary | ICD-10-CM | POA: Insufficient documentation

## 2024-01-24 DIAGNOSIS — R5382 Chronic fatigue, unspecified: Secondary | ICD-10-CM | POA: Insufficient documentation

## 2024-01-24 DIAGNOSIS — E118 Type 2 diabetes mellitus with unspecified complications: Secondary | ICD-10-CM | POA: Diagnosis not present

## 2024-01-24 LAB — CBC WITH DIFFERENTIAL/PLATELET
Abs Immature Granulocytes: 0.02 10*3/uL (ref 0.00–0.07)
Basophils Absolute: 0.1 10*3/uL (ref 0.0–0.1)
Basophils Relative: 1 %
Eosinophils Absolute: 0.4 10*3/uL (ref 0.0–0.5)
Eosinophils Relative: 5 %
HCT: 35.5 % — ABNORMAL LOW (ref 36.0–46.0)
Hemoglobin: 10.9 g/dL — ABNORMAL LOW (ref 12.0–15.0)
Immature Granulocytes: 0 %
Lymphocytes Relative: 26 %
Lymphs Abs: 2.4 10*3/uL (ref 0.7–4.0)
MCH: 24.2 pg — ABNORMAL LOW (ref 26.0–34.0)
MCHC: 30.7 g/dL (ref 30.0–36.0)
MCV: 78.7 fL — ABNORMAL LOW (ref 80.0–100.0)
Monocytes Absolute: 0.8 10*3/uL (ref 0.1–1.0)
Monocytes Relative: 9 %
Neutro Abs: 5.4 10*3/uL (ref 1.7–7.7)
Neutrophils Relative %: 59 %
Platelets: 355 10*3/uL (ref 150–400)
RBC: 4.51 MIL/uL (ref 3.87–5.11)
RDW: 17.3 % — ABNORMAL HIGH (ref 11.5–15.5)
WBC: 9 10*3/uL (ref 4.0–10.5)
nRBC: 0 % (ref 0.0–0.2)

## 2024-01-24 LAB — LIPASE, BLOOD: Lipase: 37 U/L (ref 11–51)

## 2024-01-24 LAB — COMPREHENSIVE METABOLIC PANEL WITH GFR
ALT: 57 U/L — ABNORMAL HIGH (ref 0–44)
AST: 32 U/L (ref 15–41)
Albumin: 3.8 g/dL (ref 3.5–5.0)
Alkaline Phosphatase: 107 U/L (ref 38–126)
Anion gap: 10 (ref 5–15)
BUN: 20 mg/dL (ref 8–23)
CO2: 25 mmol/L (ref 22–32)
Calcium: 9.6 mg/dL (ref 8.9–10.3)
Chloride: 100 mmol/L (ref 98–111)
Creatinine, Ser: 0.76 mg/dL (ref 0.44–1.00)
GFR, Estimated: >60 mL/min (ref >60)
Glucose, Bld: 131 mg/dL — ABNORMAL HIGH (ref 70–99)
Potassium: 4.1 mmol/L (ref 3.5–5.1)
Sodium: 135 mmol/L (ref 135–145)
Total Bilirubin: 0.6 mg/dL (ref 0.0–1.2)
Total Protein: 7.4 g/dL (ref 6.5–8.1)

## 2024-01-24 NOTE — Telephone Encounter (Signed)
 Pt received letter from [facility] that one of her insulins isn't covered, she didn't have it in front of her so she will call me back tomorrow with the info.

## 2024-01-25 ENCOUNTER — Other Ambulatory Visit (HOSPITAL_COMMUNITY): Payer: Self-pay

## 2024-01-25 ENCOUNTER — Telehealth: Payer: Self-pay

## 2024-01-25 ENCOUNTER — Telehealth: Payer: Self-pay | Admitting: Medical

## 2024-01-25 NOTE — Telephone Encounter (Signed)
(  Please see telephone encounter from 4/8 where this was initially communicated .)

## 2024-01-25 NOTE — Progress Notes (Signed)
 Lipase pancreas marker is normal, blood sugar is 131, electrolytes and kidney okay, 1 liver test slightly elevated.  Blood counts are stable  If you are still having rectal bleeding then I would follow-up with gastroenterology  Are you tolerating the current iron once daily?  Robyn Hill -refill any medications that she may need refilling at the moment

## 2024-01-25 NOTE — Telephone Encounter (Signed)
   A P/a isnt needed for Toujeo. Its already on the formulary for now until the expiration date. I have ran a test claim and the pts plan is just simply changing their formulary. Lantus will be the plans pref'd drug . The patient should have the dates of when this formulary change will exactly take place

## 2024-01-25 NOTE — Telephone Encounter (Signed)
 A P/a isnt needed for Toujeo. Its already on the formulary for now until the expiration date. I have ran a test claim and the pts plan is just simply changing their formulary. Lantus will be the plans pref'd drug . The patient should have the dates of when this formulary change will exactly take place      Toujeo SoloStar 300UNIT/ML pen-injectors          Lantus

## 2024-01-25 NOTE — Telephone Encounter (Signed)
 Pt received letter that insurance will no longer cover Toujeo, please initiate a P.A.

## 2024-01-26 DIAGNOSIS — D1730 Benign lipomatous neoplasm of skin and subcutaneous tissue of unspecified sites: Secondary | ICD-10-CM | POA: Diagnosis not present

## 2024-01-26 DIAGNOSIS — L570 Actinic keratosis: Secondary | ICD-10-CM | POA: Diagnosis not present

## 2024-01-27 NOTE — Telephone Encounter (Signed)
 Referral was already placed for [facility] [address], [address] [place], [place] [address] [phone]

## 2024-01-30 ENCOUNTER — Other Ambulatory Visit: Payer: Self-pay | Admitting: Medical

## 2024-01-31 NOTE — Telephone Encounter (Signed)
 Called [employer] & they state that the letter was a mistake & that Toujeo is covered & can be filled again tomorrow. No PA needed.   Called pt & left message

## 2024-02-04 ENCOUNTER — Other Ambulatory Visit: Payer: Self-pay | Admitting: Medical

## 2024-03-04 ENCOUNTER — Other Ambulatory Visit: Payer: Self-pay | Admitting: Medical

## 2024-03-20 DIAGNOSIS — M961 Postlaminectomy syndrome, not elsewhere classified: Secondary | ICD-10-CM | POA: Diagnosis not present

## 2024-03-20 DIAGNOSIS — G629 Polyneuropathy, unspecified: Secondary | ICD-10-CM | POA: Diagnosis not present

## 2024-03-22 ENCOUNTER — Encounter: Payer: 59 | Admitting: Medical

## 2024-03-30 ENCOUNTER — Other Ambulatory Visit: Payer: Self-pay | Admitting: Medical

## 2024-04-16 ENCOUNTER — Other Ambulatory Visit: Payer: Self-pay | Admitting: Medical

## 2024-04-17 ENCOUNTER — Other Ambulatory Visit: Payer: Self-pay

## 2024-04-17 DIAGNOSIS — E118 Type 2 diabetes mellitus with unspecified complications: Secondary | ICD-10-CM

## 2024-04-17 MED ORDER — TRULICITY 3 MG/0.5ML ~~LOC~~ SOAJ: 3.0000 mg | SUBCUTANEOUS | 2 refills | Status: DC

## 2024-04-27 ENCOUNTER — Other Ambulatory Visit: Payer: Self-pay | Admitting: Medical

## 2024-05-03 ENCOUNTER — Other Ambulatory Visit: Payer: Self-pay | Admitting: Medical

## 2024-05-03 MED ORDER — TRIAMCINOLONE ACETONIDE 0.1 % EX CREA: 1.0000 | TOPICAL_CREAM | Freq: Every day | CUTANEOUS | 1 refills | Status: AC | PRN

## 2024-05-03 MED ORDER — FLUOCINOLONE ACETONIDE 0.01 % EX CREA: 1.0000 | TOPICAL_CREAM | Freq: Every evening | CUTANEOUS | 2 refills | Status: DC | PRN

## 2024-05-09 NOTE — Progress Notes (Signed)
   05/09/2024  Patient ID: Robyn Hill, female   DOB: 08/30/1946, 78 y.o.   MRN: [id number]  Pharmacy Quality Measure Review  This patient is appearing on a report for being at risk of failing the adherence measure for hypertension (ACEi/ARB) medications this calendar year.   Medication: Enalapril  20mg  Last fill date: 04/10/24 for 90 day supply  Insurance report was not up to date. No action needed at this time.   [name], PharmD Clinical Pharmacist [phone]

## 2024-05-12 ENCOUNTER — Telehealth: Payer: Self-pay | Admitting: Medical

## 2024-05-12 ENCOUNTER — Other Ambulatory Visit: Payer: Self-pay | Admitting: Medical

## 2024-05-12 DIAGNOSIS — I10 Essential (primary) hypertension: Secondary | ICD-10-CM

## 2024-05-12 DIAGNOSIS — D500 Iron deficiency anemia secondary to blood loss (chronic): Secondary | ICD-10-CM

## 2024-05-12 DIAGNOSIS — E118 Type 2 diabetes mellitus with unspecified complications: Secondary | ICD-10-CM

## 2024-05-12 NOTE — Telephone Encounter (Signed)
 Please call her daughter to get her scheduled for either virtual consult or in person consult.  I received a form for personal care services.  The form requires a visit within 90 days of the form completion.  I believe my last visit with her was February.  We will need to review some of the request on the form

## 2024-05-15 ENCOUNTER — Other Ambulatory Visit: Payer: Self-pay

## 2024-05-15 ENCOUNTER — Ambulatory Visit (INDEPENDENT_AMBULATORY_CARE_PROVIDER_SITE_OTHER): Admitting: Medical

## 2024-05-15 ENCOUNTER — Encounter: Payer: Self-pay | Admitting: Medical

## 2024-05-15 ENCOUNTER — Telehealth: Payer: Self-pay

## 2024-05-15 ENCOUNTER — Other Ambulatory Visit (HOSPITAL_COMMUNITY)
Admission: RE | Admit: 2024-05-15 | Discharge: 2024-05-15 | Disposition: A | Discharge status: Home / Self Care | Source: Ambulatory Visit | Attending: Medical | Admitting: Medical

## 2024-05-15 VITALS — BP 112/70 | HR 57 | Wt 176.4 lb

## 2024-05-15 DIAGNOSIS — D638 Anemia in other chronic diseases classified elsewhere: Secondary | ICD-10-CM | POA: Diagnosis not present

## 2024-05-15 DIAGNOSIS — G894 Chronic pain syndrome: Secondary | ICD-10-CM

## 2024-05-15 DIAGNOSIS — E119 Type 2 diabetes mellitus without complications: Secondary | ICD-10-CM | POA: Diagnosis not present

## 2024-05-15 DIAGNOSIS — K921 Melena: Secondary | ICD-10-CM | POA: Diagnosis not present

## 2024-05-15 DIAGNOSIS — D500 Iron deficiency anemia secondary to blood loss (chronic): Secondary | ICD-10-CM | POA: Insufficient documentation

## 2024-05-15 DIAGNOSIS — M5442 Lumbago with sciatica, left side: Secondary | ICD-10-CM

## 2024-05-15 DIAGNOSIS — R262 Difficulty in walking, not elsewhere classified: Secondary | ICD-10-CM | POA: Diagnosis not present

## 2024-05-15 DIAGNOSIS — I480 Paroxysmal atrial fibrillation: Secondary | ICD-10-CM

## 2024-05-15 DIAGNOSIS — I10 Essential (primary) hypertension: Secondary | ICD-10-CM | POA: Diagnosis not present

## 2024-05-15 DIAGNOSIS — E118 Type 2 diabetes mellitus with unspecified complications: Secondary | ICD-10-CM | POA: Diagnosis not present

## 2024-05-15 DIAGNOSIS — E1142 Type 2 diabetes mellitus with diabetic polyneuropathy: Secondary | ICD-10-CM | POA: Diagnosis not present

## 2024-05-15 DIAGNOSIS — D509 Iron deficiency anemia, unspecified: Secondary | ICD-10-CM | POA: Insufficient documentation

## 2024-05-15 DIAGNOSIS — R5383 Other fatigue: Secondary | ICD-10-CM | POA: Diagnosis not present

## 2024-05-15 DIAGNOSIS — Z789 Other specified health status: Secondary | ICD-10-CM | POA: Diagnosis not present

## 2024-05-15 DIAGNOSIS — G8929 Other chronic pain: Secondary | ICD-10-CM

## 2024-05-15 DIAGNOSIS — Z7901 Long term (current) use of anticoagulants: Secondary | ICD-10-CM

## 2024-05-15 DIAGNOSIS — M4316 Spondylolisthesis, lumbar region: Secondary | ICD-10-CM | POA: Diagnosis not present

## 2024-05-15 LAB — CBC
HCT: 36.4 % (ref 36.0–46.0)
Hemoglobin: 11.5 g/dL — ABNORMAL LOW (ref 12.0–15.0)
MCH: 25.8 pg — ABNORMAL LOW (ref 26.0–34.0)
MCHC: 31.6 g/dL (ref 30.0–36.0)
MCV: 81.6 fL (ref 80.0–100.0)
Platelets: 327 K/uL (ref 150–400)
RBC: 4.46 MIL/uL (ref 3.87–5.11)
RDW: 13.2 % (ref 11.5–15.5)
WBC: 11.2 K/uL — ABNORMAL HIGH (ref 4.0–10.5)
nRBC: 0 % (ref 0.0–0.2)

## 2024-05-15 LAB — COMPREHENSIVE METABOLIC PANEL WITH GFR
ALT: 35 U/L (ref 0–44)
AST: 30 U/L (ref 15–41)
Albumin: 3.7 g/dL (ref 3.5–5.0)
Alkaline Phosphatase: 113 U/L (ref 38–126)
Anion gap: 9 (ref 5–15)
BUN: 22 mg/dL (ref 8–23)
CO2: 29 mmol/L (ref 22–32)
Calcium: 9.4 mg/dL (ref 8.9–10.3)
Chloride: 99 mmol/L (ref 98–111)
Creatinine, Ser: 0.79 mg/dL (ref 0.44–1.00)
GFR, Estimated: >60 mL/min (ref >60)
Glucose, Bld: 120 mg/dL — ABNORMAL HIGH (ref 70–99)
Potassium: 3.8 mmol/L (ref 3.5–5.1)
Sodium: 137 mmol/L (ref 135–145)
Total Bilirubin: 0.3 mg/dL (ref 0.0–1.2)
Total Protein: 7.1 g/dL (ref 6.5–8.1)

## 2024-05-15 LAB — FERRITIN: Ferritin: 6 ng/mL — ABNORMAL LOW (ref 11–307)

## 2024-05-15 LAB — IRON AND TIBC
Iron: 25 ug/dL — ABNORMAL LOW (ref 28–170)
Saturation Ratios: 6 % — ABNORMAL LOW (ref 10.4–31.8)
TIBC: 429 ug/dL (ref 250–450)
UIBC: 404 ug/dL

## 2024-05-15 LAB — VITAMIN B12: Vitamin B-12: 359 pg/mL (ref 180–914)

## 2024-05-15 LAB — HEMOGLOBIN A1C
Hgb A1c MFr Bld: 7 % — ABNORMAL HIGH (ref 4.8–5.6)
Mean Plasma Glucose: 154.2 mg/dL

## 2024-05-15 NOTE — Progress Notes (Signed)
 Subjective: Chief Complaint  Patient presents with   Medical Management of Chronic Issues    Med check, rectal bleeding everyday- sometimes even clotting, sees GI in August, would like help getting Personal care services. Went today to get blood work done. Feeling bad. Had 4 episodes of A-fib today.    Here with her daughter Robyn Hill for multiple concerns  She is here to complete a form for personal care assistant.  She needs help with activities of daily  living, needs help with light housekeeping, grocery shopping, help with showering and toileting if needed.  Her daughter has primarily been helping with this but she needs this help daily  She notes that she is feeling quite tired.  She notes recurrent blood in the stool.  She has had bleeding pretty much every day for the last month or more in the stool.  Sometimes she can get worse blood with straining sometimes not.  It can be bright red or dark red.  She has an appointment with [facility] but is not till August 13.  When she had a GI bleed back in January 2025 she saw Dr. Socorro  She continues on Eliquis  twice daily, 5 mg twice daily for anticoagulation with A-fib.  No other bleeding or bruising reported  Lately she has been seeing fairly frequent A-fib episodes on her Apple Watch.  Her chart shows diltiazem  prescription 3 times a day dosing but she has only been doing this 2 times a day.  She has felt little more winded lately, fatigue.  She just last week started back on oral iron  3 times a day.  She stopped Celebrex  given the bleeding and given the phone call here last week  Her neuropathy is not well-controlled.  She is on nortriptyline  but takes this more for sleep occasionally.  She is not taking this daily as he feels really sleepy the next day if she takes it.  She has previously had a spinal cord stimulator for back pain but not for neuropathy.  It did not work so well when she had it then.  No other aggravating or relieving  factors.    No other c/o.  Past Medical History:  Diagnosis Date   Adrenal mass, right (HCC)    Allergy    Anemia    Asthma    as a teenager   Blind right eye    since childhood   Blood transfusion 1970   Chronic back pain    Chronic diarrhea    Colestipol  therapy   Depression    Diverticulitis    Diverticulosis    Fatty liver    Female bladder prolapse    GERD (gastroesophageal reflux disease)    Glaucoma    H/O bone density study 11/29/2014   normal study although mild decreased in density from prior study   H/O hiatal hernia    Hemorrhoids    History of kidney stones    History of uterine cancer    s/p hysterectomy   Hyperlipidemia    Hypertension    Migraine    hx of migraines in past    Neuromuscular disorder (HCC)    diabetic neuropahthy   Numbness and tingling of both legs    Osteoarthritis    Rheumatic fever    age 48   Type II diabetes mellitus (HCC)    Urinary incontinence    Vitamin D  deficiency    Current Outpatient Medications on File Prior to Visit  Medication Sig Dispense Refill  apixaban  (ELIQUIS ) 5 MG TABS tablet Take 1 tablet (5 mg total) by mouth 2 (two) times daily.     atorvastatin  (LIPITOR) 80 MG tablet TAKE 1 TABLET BY MOUTH AT BEDTIME 90 tablet 1   brimonidine  (ALPHAGAN ) 0.2 % ophthalmic solution Place 1 drop into both eyes 2 (two) times daily.     Continuous Glucose Sensor (FREESTYLE LIBRE 3 PLUS SENSOR) MISC place one sensor EVERY 15 DAYS. 2 each 5   dexlansoprazole  (DEXILANT ) 60 MG capsule TAKE ONE CAPSULE BY MOUTH TWICE DAILY 180 capsule 1   diltiazem  (CARDIZEM ) 60 MG tablet Take 1 tablet (60 mg total) by mouth 3 (three) times daily. 90 tablet 5   dorzolamide -timolol  (COSOPT ) 22.3-6.8 MG/ML ophthalmic solution Place 1 drop into both eyes 2 (two) times daily.     Dulaglutide  (TRULICITY ) 3 MG/0.5ML SOAJ Inject 3 mg into the skin once a week. 6 mL 2   DULoxetine  (CYMBALTA ) 60 MG capsule TAKE 1 CAPSULE BY MOUTH TWICE DAILY 180 capsule 0    enalapril  (VASOTEC ) 20 MG tablet TAKE 1 TABLET BY MOUTH DAILY 90 tablet 0   ferrous gluconate  (FERGON) 324 MG tablet Take 1 tablet (324 mg total) by mouth 2 (two) times daily with a meal. 60 tablet 3   fluocinolone  0.01 % cream Apply 1 application  topically at bedtime as needed (to affected areas of the legs and arms). 60 g 2   hydrochlorothiazide  (HYDRODIURIL ) 25 MG tablet TAKE 1 TABLET BY MOUTH DAILY 90 tablet 1   latanoprost  (XALATAN ) 0.005 % ophthalmic solution Place 1 drop into both eyes at bedtime.     LINZESS  72 MCG capsule TAKE ONE CAPSULE BY MOUTH EVERY DAY BEFORE breakfast 90 capsule 2   Magnesium Citrate (MAGNESIUM GUMMIES PO) Take 500 mg by mouth daily.     metFORMIN  (GLUCOPHAGE ) 500 MG tablet TAKE 1 TABLET BY MOUTH TWICE DAILY WITH MEALS 180 tablet 0   morphine  (MSIR) 15 MG tablet Take 15 mg by mouth every 4 (four) hours.     nortriptyline  (PAMELOR ) 10 MG capsule Take 10 mg by mouth daily as needed for sleep.     PROCTO-MED HC  2.5 % rectal cream APPLY RECTALLY TO THE AFFECTED AREA(S) TWICE DAILY 28 g 0   triamcinolone  cream (KENALOG ) 0.1 % Apply 1 Application topically daily as needed (for itching- affected sites). 45 g 1   insulin  glargine, 1 Unit Dial , (TOUJEO  SOLOSTAR) 300 UNIT/ML Solostar Pen Inject 20 Units into the skin daily. 15 mL 2   Insulin  Pen Needle (TRUEPLUS 5-BEVEL PEN NEEDLES) 31G X 5 MM MISC USE AS DIRECTED TO INJECT TOUJEO  DAILY 100 each 1   No current facility-administered medications on file prior to visit.     The following portions of the patient's history were reviewed and updated as appropriate: allergies, current medications, past family history, past medical history, past social history, past surgical history and problem list.  ROS Otherwise as in subjective above    Objective: BP 112/70   Pulse (!) 57   Wt 176 lb 6.4 oz (80 kg)   SpO2 98%   BMI 30.28 kg/m   General appearance: alert, no distress, well developed, well nourished Neck:  supple, no lymphadenopathy, no thyromegaly, no masses Heart: RRR, normal S1, S2, no murmurs Lungs: CTA bilaterally, no wheezes, rhonchi, or rales Pulses: 2+ radial pulses, 2+ pedal pulses, normal cap refill Ext: no edema     Assessment: Encounter Diagnoses  Name Primary?   Other fatigue Yes   Spondylolisthesis of  lumbar region    Decreased activities of daily living (ADL)    Diabetic polyneuropathy associated with type 2 diabetes mellitus (HCC)    Essential hypertension    Non-insulin  dependent type 2 diabetes mellitus (HCC)    Paroxysmal A-fib (HCC)    Chronic bilateral low back pain with left-sided sciatica    Chronic pain syndrome    Impaired ambulation    Anemia of chronic disease    Blood in stool    Anticoagulant long-term use      Plan: Fatigue-multifactorial.  I reviewed her labs that she had done today.  We will see if we can add a B12 level.  History of atrial fibrillation-cut back to Eliquis  2.5 mg twice daily given the bleeding.  Continue diltiazem  but increase to 3 times a day as per label.  She has only been doing it twice a day.  Referral to A-fib clinic  Blood in stool, anemia-her iron  stores are low but her hemoglobin is relatively okay.  Begin oral iron  3 times a day if tolerated.  Referral for iron  IV infusion and referral back to gastroenterology.  Will call them to see if we can get her sooner appointment.  She discontinued Celebrex  recently as well given the bleeding  Iron  deficiency-we will set up for IV infusion given her significantly low iron  and ongoing blood in the stool  Chronic anticoagulation-changed to lower dose Eliquis  2.5 mg twice daily.  However if you continue to bleed even on this dose we may need to try different regimen or different medication  Impaired ambulation, difficulty with ADLs, numerous comorbidity health issues-referral for personal care assistant.  Paperwork completed today  Diabetes-continue Trulicity  injection 3 mg weekly,  continue metformin  500 mg twice daily continue Toujeo  insulin  20 units daily  Chronic back pain, spondylolisthesis of lumbar region, neuropathy-discussed her possibly going back to the neurosurgery office and discussing other options  Hyperlipidemia continue atorvastatin  40 mg daily  Hypertension -continue enalapril  20 mg daily, hydrochlorothiazide  25 mg daily  Jontae was seen today for medical management of chronic issues.  Diagnoses and all orders for this visit:  Other fatigue -     Amb Referral to AFIB Clinic  Spondylolisthesis of lumbar region  Decreased activities of daily living (ADL)  Diabetic polyneuropathy associated with type 2 diabetes mellitus (HCC)  Essential hypertension  Non-insulin  dependent type 2 diabetes mellitus (HCC)  Paroxysmal A-fib (HCC) -     Amb Referral to AFIB Clinic  Chronic bilateral low back pain with left-sided sciatica  Chronic pain syndrome  Impaired ambulation  Anemia of chronic disease  Blood in stool  Anticoagulant long-term use  Spent > 45 minutes face to face with patient in discussion of symptoms, evaluation, plan and recommendations.    Follow up: pending call back

## 2024-05-15 NOTE — Progress Notes (Signed)
 I have faxed over notes and lab results to [facility] ATTENTION; Jacqueline Thigpen as she is the only provider there this week. They will see if they can work her in.  Fax # [phone]

## 2024-05-15 NOTE — Patient Instructions (Signed)
 Increase Diltiazem  to 3 times daily as on directions  Decreased or cut in half the Eliquis  to 2.5mg  twice daily given the bleeding.  We will try to get you in for urgent IV infusion, Afib clinic, and work to get into gastro specialist ASAP  We will send in form for personal care assistant

## 2024-05-15 NOTE — Telephone Encounter (Addendum)
 Auth Submission: Approved Site of care: Site of care: [facility] Payer: [employer] Medication & CPT/J Code(s) submitted: Feraheme (ferumoxytol ) [id number] Diagnosis Code:  Route of submission (phone, fax, portal): portal Phone # Fax # Auth type: Buy/Bill PB Units/visits requested: 510mg  x 3 doses Reference number: [id number] Approval from: 05/15/24 to 10/18/24

## 2024-05-16 ENCOUNTER — Ambulatory Visit: Payer: Self-pay | Admitting: Medical

## 2024-05-16 NOTE — Progress Notes (Signed)
 Results thru my chart

## 2024-05-16 NOTE — Progress Notes (Signed)
 [facility] called and they can work patient in tomorrow but patient has an iron  infusion at that time and decided to just keep the original GI appointment for 05/31/24. They just wanted to call and let us  know.

## 2024-05-17 ENCOUNTER — Encounter: Attending: Medical | Admitting: Emergency Medicine

## 2024-05-17 VITALS — BP 136/64 | HR 65 | Temp 97.8°F | Resp 17

## 2024-05-17 DIAGNOSIS — K922 Gastrointestinal hemorrhage, unspecified: Secondary | ICD-10-CM | POA: Diagnosis not present

## 2024-05-17 DIAGNOSIS — D509 Iron deficiency anemia, unspecified: Secondary | ICD-10-CM

## 2024-05-17 DIAGNOSIS — R413 Other amnesia: Secondary | ICD-10-CM

## 2024-05-17 DIAGNOSIS — D638 Anemia in other chronic diseases classified elsewhere: Secondary | ICD-10-CM

## 2024-05-17 MED ORDER — SODIUM CHLORIDE 0.9 % IV SOLN
510.0000 mg | Freq: Once | INTRAVENOUS | Status: AC
Start: 2024-05-17 — End: 2024-05-17
  Administered 2024-05-17: 510 mg via INTRAVENOUS
  Filled 2024-05-17: qty 17

## 2024-05-17 MED ORDER — DIPHENHYDRAMINE HCL 25 MG PO CAPS
25.0000 mg | ORAL_CAPSULE | Freq: Once | ORAL | Status: AC
  Administered 2024-05-17: 25 mg via ORAL

## 2024-05-17 MED ORDER — ACETAMINOPHEN 325 MG PO TABS
650.0000 mg | ORAL_TABLET | Freq: Once | ORAL | Status: AC
Start: 2024-05-17 — End: 2024-05-17
  Administered 2024-05-17: 650 mg via ORAL

## 2024-05-17 NOTE — Progress Notes (Signed)
 Diagnosis: Iron  Deficiency Anemia  Provider:  alm Gent PA  Procedure: IV Infusion  IV Type: Peripheral, IV Location: L Antecubital  Feraheme (Ferumoxytol ), Dose: 510 mg  Infusion Start Time: 0939  Infusion Stop Time: 0957  Post Infusion IV Care: Observation period completed and Peripheral IV Discontinued  Discharge: Condition: Good, Destination: Home . AVS Declined  Performed by:  Delon ONEIDA Officer, RN

## 2024-05-25 ENCOUNTER — Other Ambulatory Visit: Payer: Self-pay | Admitting: Medical

## 2024-05-29 ENCOUNTER — Other Ambulatory Visit: Payer: Self-pay | Admitting: Medical

## 2024-05-31 ENCOUNTER — Ambulatory Visit

## 2024-05-31 ENCOUNTER — Encounter: Attending: Medical | Admitting: *Deleted

## 2024-05-31 VITALS — BP 133/73 | HR 69 | Temp 97.6°F | Resp 16

## 2024-05-31 DIAGNOSIS — K922 Gastrointestinal hemorrhage, unspecified: Secondary | ICD-10-CM | POA: Insufficient documentation

## 2024-05-31 DIAGNOSIS — D509 Iron deficiency anemia, unspecified: Secondary | ICD-10-CM | POA: Insufficient documentation

## 2024-05-31 DIAGNOSIS — K649 Unspecified hemorrhoids: Secondary | ICD-10-CM | POA: Diagnosis not present

## 2024-05-31 DIAGNOSIS — K625 Hemorrhage of anus and rectum: Secondary | ICD-10-CM | POA: Diagnosis not present

## 2024-05-31 DIAGNOSIS — K5903 Drug induced constipation: Secondary | ICD-10-CM | POA: Diagnosis not present

## 2024-05-31 DIAGNOSIS — D638 Anemia in other chronic diseases classified elsewhere: Secondary | ICD-10-CM | POA: Insufficient documentation

## 2024-05-31 MED ORDER — SODIUM CHLORIDE 0.9 % IV SOLN
510.0000 mg | Freq: Once | INTRAVENOUS | Status: AC
  Administered 2024-05-31 (×2): 510 mg via INTRAVENOUS
  Filled 2024-05-31: qty 17

## 2024-05-31 MED ORDER — ACETAMINOPHEN 325 MG PO TABS
650.0000 mg | ORAL_TABLET | Freq: Once | ORAL | Status: AC
  Administered 2024-05-31 (×2): 650 mg via ORAL

## 2024-05-31 MED ORDER — DIPHENHYDRAMINE HCL 25 MG PO CAPS
25.0000 mg | ORAL_CAPSULE | Freq: Once | ORAL | Status: AC
  Administered 2024-05-31 (×2): 25 mg via ORAL

## 2024-05-31 NOTE — Progress Notes (Signed)
 Diagnosis: Iron  Deficiency Anemia  Provider:  Alm Gent, PA-C  Procedure: IV Infusion  IV Type: Peripheral, IV Location: L Hand  Feraheme (Ferumoxytol ), Dose: 510 mg  Infusion Start Time: 1330  Infusion Stop Time: 1345  Post Infusion IV Care: Observation period completed  Discharge: Condition: Good, Destination: Home . AVS Provided  Performed by:  Baldwin Darice Helling, RN

## 2024-06-05 DIAGNOSIS — H401133 Primary open-angle glaucoma, bilateral, severe stage: Secondary | ICD-10-CM | POA: Diagnosis not present

## 2024-06-06 ENCOUNTER — Telehealth: Payer: Self-pay | Admitting: Medical

## 2024-06-06 NOTE — Telephone Encounter (Signed)
 Copied from CRM #[id number]. Topic: Referral - Request for Referral >> Jun 06, 2024  3:42 PM Wess RAMAN wrote: Did the patient discuss referral with their provider in the last year? Yes (If No - schedule appointment) (If Yes - send message)  Appointment offered? No  Type of order/referral and detailed reason for visit: Personal Care Services. Application is in [place].   Preference of office, provider, location: Application in mychart  If referral order, have you been seen by this specialty before? No (If Yes, this issue or another issue? When? Where?  Can we respond through MyChart? Yes

## 2024-06-07 ENCOUNTER — Encounter: Payer: Self-pay | Admitting: Medical

## 2024-06-07 DIAGNOSIS — H547 Unspecified visual loss: Secondary | ICD-10-CM | POA: Insufficient documentation

## 2024-06-12 ENCOUNTER — Ambulatory Visit (HOSPITAL_COMMUNITY)
Admission: RE | Admit: 2024-06-12 | Discharge: 2024-06-12 | Disposition: A | Discharge status: Home / Self Care | Source: Ambulatory Visit | Attending: Physician Assistant | Admitting: Physician Assistant

## 2024-06-12 VITALS — BP 142/70 | HR 55 | Ht 64.0 in | Wt 178.8 lb

## 2024-06-12 DIAGNOSIS — H348392 Tributary (branch) retinal vein occlusion, unspecified eye, stable: Secondary | ICD-10-CM | POA: Diagnosis not present

## 2024-06-12 DIAGNOSIS — I480 Paroxysmal atrial fibrillation: Secondary | ICD-10-CM

## 2024-06-12 DIAGNOSIS — E113293 Type 2 diabetes mellitus with mild nonproliferative diabetic retinopathy without macular edema, bilateral: Secondary | ICD-10-CM | POA: Diagnosis not present

## 2024-06-12 DIAGNOSIS — D6869 Other thrombophilia: Secondary | ICD-10-CM

## 2024-06-12 DIAGNOSIS — H401133 Primary open-angle glaucoma, bilateral, severe stage: Secondary | ICD-10-CM | POA: Diagnosis not present

## 2024-06-12 DIAGNOSIS — I4891 Unspecified atrial fibrillation: Secondary | ICD-10-CM | POA: Diagnosis not present

## 2024-06-12 DIAGNOSIS — H353132 Nonexudative age-related macular degeneration, bilateral, intermediate dry stage: Secondary | ICD-10-CM | POA: Diagnosis not present

## 2024-06-12 LAB — HM DIABETES EYE EXAM

## 2024-06-12 NOTE — Progress Notes (Signed)
 Primary Care Physician: Bulah Alm RAMAN, PA-C Primary Cardiologist: Diannah SHAUNNA Maywood, MD Electrophysiologist: None  Referring Physician: Alm Bulah PA   Robyn Hill is a 78 y.o. female with a history of HTN, carotid artery disease, CAD, DM, HLD, GI bleed, atrial fibrillation who presents for follow up in the [facility].  The patient was initially diagnosed with atrial fibrillation in 2023. Patient is on Eliquis  for stroke prevention. She was admitted 10/2023 with GI bleeding. She had chronic intermittent bleeding but became acutely worse. GI consulted and EGD showed bleeding angiectasia in the stomach, treated with APC. Colonoscopy showed 5 mm polyp in cecum (removed) and 20 mm polyp in transverse colon (retrieved), diverticulosis, internal hemorrhoids. Eliquis  resumed after 48 hours.    Patient presents today for follow up for atrial fibrillation. Patient reports that she feels fatigued most of the time. Her Apple Watch indicates that she has afib episodes about every other week lasting for a few hours. She states that she had a sleep study many years ago which was negative for OSA. She has had another episode of rectal bleeding recently but it was a small amount.   Today, she denies symptoms of palpitations, chest pain, shortness of breath, orthopnea, PND, lower extremity edema, dizziness, presyncope, syncope, snoring, bleeding, or neurologic sequela. The patient is tolerating medications without difficulties and is otherwise without complaint today.    Atrial Fibrillation Risk Factors:  she does not have symptoms or diagnosis of sleep apnea. she does not have a history of rheumatic fever. The patient does have a history of early familial atrial fibrillation or other arrhythmias. Daughter has afib.   Atrial Fibrillation Management history:  Previous antiarrhythmic drugs: none Previous cardioversions: none Previous ablations: none Anticoagulation  history: Eliquis   ROS- All systems are reviewed and negative except as per the HPI above.  Past Medical History:  Diagnosis Date   Adrenal mass, right (HCC)    Allergy    Anemia    Asthma    as a teenager   Blind right eye    since childhood   Blood transfusion 1970   Chronic back pain    Chronic diarrhea    Colestipol  therapy   Depression    Diverticulitis    Diverticulosis    Fatty liver    Female bladder prolapse    GERD (gastroesophageal reflux disease)    Glaucoma    H/O bone density study 11/29/2014   normal study although mild decreased in density from prior study   H/O hiatal hernia    Hemorrhoids    History of kidney stones    History of uterine cancer    s/p hysterectomy   Hyperlipidemia    Hypertension    Migraine    hx of migraines in past    Neuromuscular disorder (HCC)    diabetic neuropahthy   Numbness and tingling of both legs    Osteoarthritis    Rheumatic fever    age 60   Type II diabetes mellitus (HCC)    Urinary incontinence    Vitamin D  deficiency     Current Outpatient Medications  Medication Sig Dispense Refill   apixaban  (ELIQUIS ) 5 MG TABS tablet Take 1 tablet (5 mg total) by mouth 2 (two) times daily.     atorvastatin  (LIPITOR) 80 MG tablet TAKE 1 TABLET BY MOUTH AT BEDTIME 90 tablet 1   brimonidine  (ALPHAGAN ) 0.2 % ophthalmic solution Place 1 drop into both eyes 2 (two) times  daily.     Continuous Glucose Sensor (FREESTYLE LIBRE 3 PLUS SENSOR) MISC place one sensor EVERY 15 DAYS. 2 each 5   dexlansoprazole  (DEXILANT ) 60 MG capsule TAKE ONE CAPSULE BY MOUTH TWICE DAILY 180 capsule 2   diltiazem  (CARDIZEM ) 60 MG tablet Take 1 tablet (60 mg total) by mouth 3 (three) times daily. 90 tablet 5   dorzolamide -timolol  (COSOPT ) 22.3-6.8 MG/ML ophthalmic solution Place 1 drop into both eyes 2 (two) times daily.     Dulaglutide  (TRULICITY ) 3 MG/0.5ML SOAJ Inject 3 mg into the skin once a week. 6 mL 2   DULoxetine  (CYMBALTA ) 60 MG capsule TAKE 1  CAPSULE BY MOUTH TWICE DAILY 180 capsule 0   EQ FIBER SUPPLEMENT PO Taking 3 chewables by mouth daily     ferrous gluconate  (FERGON) 324 MG tablet Take 1 tablet (324 mg total) by mouth 2 (two) times daily with a meal. (Patient taking differently: Take 324 mg by mouth 3 (three) times daily with meals.) 60 tablet 3   fluocinolone  0.01 % cream Apply 1 application  topically at bedtime as needed (to affected areas of the legs and arms). 60 g 2   hydrochlorothiazide  (HYDRODIURIL ) 25 MG tablet TAKE 1 TABLET BY MOUTH DAILY 90 tablet 1   insulin  glargine, 1 Unit Dial , (TOUJEO  SOLOSTAR) 300 UNIT/ML Solostar Pen Inject 20 Units into the skin daily. 15 mL 2   Insulin  Pen Needle (TRUEPLUS 5-BEVEL PEN NEEDLES) 31G X 5 MM MISC USE AS DIRECTED TO INJECT TOUJEO  DAILY 100 each 1   latanoprost  (XALATAN ) 0.005 % ophthalmic solution Place 1 drop into both eyes at bedtime.     LINZESS  72 MCG capsule TAKE ONE CAPSULE BY MOUTH EVERY DAY BEFORE breakfast 90 capsule 2   Magnesium Citrate (MAGNESIUM GUMMIES PO) Take 500 mg by mouth daily.     metFORMIN  (GLUCOPHAGE ) 500 MG tablet TAKE 1 TABLET BY MOUTH TWICE DAILY WITH MEALS 180 tablet 0   morphine  (MSIR) 15 MG tablet Take 15 mg by mouth every 4 (four) hours.     Multiple Vitamins-Minerals (CENTRUM SILVER  50+WOMEN) TABS Take 2 capsules by mouth every morning.     Multiple Vitamins-Minerals (PRESERVISION AREDS 2) CAPS Take 2 capsules by mouth every morning.     nortriptyline  (PAMELOR ) 10 MG capsule Take 10 mg by mouth daily as needed for sleep. (Patient taking differently: Take 10 mg by mouth as needed for sleep.)     PROCTO-MED HC  2.5 % rectal cream APPLY RECTALLY TO THE AFFECTED AREA(S) TWICE DAILY 28 g 0   triamcinolone  cream (KENALOG ) 0.1 % Apply 1 Application topically daily as needed (for itching- affected sites). 45 g 1   enalapril  (VASOTEC ) 20 MG tablet TAKE 1 TABLET BY MOUTH DAILY (Patient not taking: Reported on 06/12/2024) 90 tablet 0   No current  facility-administered medications for this encounter.    Physical Exam: BP (!) 142/70   Pulse (!) 55   Ht 5' 4 (1.626 m)   Wt 81.1 kg   BMI 30.69 kg/m   GEN: Well nourished, well developed in no acute distress CARDIAC: Regular rate and rhythm with occasional ectopy, no murmurs, rubs, gallops RESPIRATORY:  Clear to auscultation without rales, wheezing or rhonchi  ABDOMEN: Soft, non-tender, non-distended EXTREMITIES:  No edema; No deformity   Wt Readings from Last 3 Encounters:  06/12/24 81.1 kg  05/15/24 80 kg  12/30/23 78.9 kg     EKG today demonstrates  SB, PAC Vent. rate 55 BPM PR interval 186 ms QRS  duration 94 ms QT/QTcB 452/432 ms   Echo 09/08/23 demonstrated   1. Left ventricular ejection fraction, by estimation, is 60 to 65%. The  left ventricle has normal function. The left ventricle has no regional  wall motion abnormalities. There is mild left ventricular hypertrophy.  Left ventricular diastolic parameters are indeterminate.   2. Right ventricular systolic function is normal. The right ventricular  size is normal. There is moderately elevated pulmonary artery systolic  pressure.   3. The mitral valve is abnormal. Mild mitral valve regurgitation. No  evidence of mitral stenosis.   4. The tricuspid valve is abnormal.   5. The aortic valve is tricuspid. Aortic valve regurgitation is not  visualized. No aortic stenosis is present.   6. The inferior vena cava is normal in size with greater than 50%  respiratory variability, suggesting right atrial pressure of 3 mmHg.   Comparison(s): EF 65-70%. Mild LVH. RV size mildly increased. Moderate  TR-RVSP 46.6 mmHG. Mild MR.    CHA2DS2-VASc Score = 6  The patient's score is based upon: CHF History: 0 HTN History: 1 Diabetes History: 1 Stroke History: 0 Vascular Disease History: 1 Age Score: 2 Gender Score: 1       ASSESSMENT AND PLAN: Paroxysmal Atrial Fibrillation (ICD10:  I48.0) The patient's  CHA2DS2-VASc score is 6, indicating a 9.7% annual risk of stroke.   Patient in SR today but having frequent episodes detected on her smart watch. We discussed rhythm control options today including AAD and ablation. She is interested in discussing ablation, will refer to EP.  Continue diltiazem  60 mg TID Continue Eliquis  5 mg BID, see plans below.   Secondary Hypercoagulable State (ICD10:  D68.69) The patient is at significant risk for stroke/thromboembolism based upon her CHA2DS2-VASc Score of 6.  Continue Apixaban  (Eliquis ). Patient has h/o GI bleeding, was admitted earlier this year. We discussed Watchman today and she is interested in consultation, will refer.   HTN Stable on current regimen  CAD No anginal symptoms Followed by Dr Mallipeddi   Follow up with EP to discuss ablation and Watchman.       [facility] [address] [place], [place] [address] [phone]

## 2024-06-13 ENCOUNTER — Ambulatory Visit: Payer: Self-pay | Admitting: Medical

## 2024-06-13 ENCOUNTER — Encounter: Payer: Self-pay | Admitting: Medical

## 2024-06-13 NOTE — Progress Notes (Signed)
 abstract

## 2024-06-20 DIAGNOSIS — M961 Postlaminectomy syndrome, not elsewhere classified: Secondary | ICD-10-CM | POA: Diagnosis not present

## 2024-06-20 DIAGNOSIS — G629 Polyneuropathy, unspecified: Secondary | ICD-10-CM | POA: Diagnosis not present

## 2024-06-22 ENCOUNTER — Other Ambulatory Visit: Payer: Self-pay | Admitting: Medical

## 2024-06-23 ENCOUNTER — Other Ambulatory Visit: Payer: Self-pay | Admitting: Neurosurgery

## 2024-06-23 DIAGNOSIS — M961 Postlaminectomy syndrome, not elsewhere classified: Secondary | ICD-10-CM

## 2024-06-27 ENCOUNTER — Other Ambulatory Visit: Payer: Self-pay | Admitting: *Deleted

## 2024-06-27 DIAGNOSIS — D638 Anemia in other chronic diseases classified elsewhere: Secondary | ICD-10-CM

## 2024-06-28 ENCOUNTER — Encounter: Attending: Medical | Admitting: Internal Medicine

## 2024-06-28 ENCOUNTER — Other Ambulatory Visit: Payer: Self-pay | Admitting: Medical

## 2024-06-28 VITALS — BP 136/61 | HR 66 | Temp 97.9°F | Resp 14

## 2024-06-28 DIAGNOSIS — D509 Iron deficiency anemia, unspecified: Secondary | ICD-10-CM | POA: Diagnosis not present

## 2024-06-28 DIAGNOSIS — K922 Gastrointestinal hemorrhage, unspecified: Secondary | ICD-10-CM | POA: Diagnosis not present

## 2024-06-28 DIAGNOSIS — D638 Anemia in other chronic diseases classified elsewhere: Secondary | ICD-10-CM | POA: Insufficient documentation

## 2024-06-28 MED ORDER — SODIUM CHLORIDE 0.9 % IV SOLN
510.0000 mg | Freq: Once | INTRAVENOUS | Status: AC
  Administered 2024-06-28: 510 mg via INTRAVENOUS
  Filled 2024-06-28: qty 17

## 2024-06-28 MED ORDER — DIPHENHYDRAMINE HCL 25 MG PO CAPS
25.0000 mg | ORAL_CAPSULE | Freq: Once | ORAL | Status: AC
  Administered 2024-06-28: 25 mg via ORAL

## 2024-06-28 MED ORDER — ACETAMINOPHEN 325 MG PO TABS
650.0000 mg | ORAL_TABLET | Freq: Once | ORAL | Status: AC
  Administered 2024-06-28: 650 mg via ORAL

## 2024-06-28 NOTE — Progress Notes (Signed)
 Diagnosis: Iron  Deficiency Anemia  Provider:  Alm Gent, PA-C  Procedure: IV Infusion  IV Type: Peripheral, IV Location: L Antecubital  Feraheme (Ferumoxytol ), Dose: 510 mg  Infusion Start Time: 1343  Infusion Stop Time: 1405  Post Infusion IV Care: Observation period completed  Discharge: Condition: Good, Destination: Home . AVS Provided  Performed by:  Blanca Selinda SAUNDERS, LPN

## 2024-06-28 NOTE — Addendum Note (Signed)
 Addended by: BLANCA SELINDA SAUNDERS on: 06/28/2024 02:43 PM   Modules accepted: Orders

## 2024-06-29 ENCOUNTER — Other Ambulatory Visit: Payer: Self-pay | Admitting: Medical

## 2024-07-02 NOTE — Progress Notes (Signed)
 Electrophysiology Office Note:   Date:  07/03/2024  ID:  Robyn Hill, DOB 1946-09-11, MRN [id number]  Primary Cardiologist: Diannah SHAUNNA Maywood, MD Electrophysiologist: Fonda Kitty, MD      History of Present Illness:   Robyn Hill is a 78 y.o. female with h/o HTN, carotid artery disease, CAD, DM, HLD, GI bleed, atrial fibrillation who is being seen today for evaluation for AF ablation and Watchman device implant at the request of Robyn Fenton, PA.  The patient was initially diagnosed with atrial fibrillation in 2023. Patient is on Eliquis  for stroke prevention. She was admitted 10/2023 with GI bleeding which required interruption of her anticoagulation.  She is very fearful about recurrence of GI bleeding.  Her Apple Watch indicates that she continues to have afib episodes about every other week, sometimes lasting for a few hours.  Burden appears to be increasing over time.  Otherwise doing relatively well.  No new or acute complaints today.   Review of systems complete and found to be negative unless listed in HPI.   EP Information / Studies Reviewed:    EKG is not ordered today. EKG from 06/12/24 reviewed which showed sinus bradycardia with PACs.      Echo 09/08/23: 1. Left ventricular ejection fraction, by estimation, is 60 to 65%. The  left ventricle has normal function. The left ventricle has no regional  wall motion abnormalities. There is mild left ventricular hypertrophy.  Left ventricular diastolic parameters  are indeterminate.   2. Right ventricular systolic function is normal. The right ventricular  size is normal. There is moderately elevated pulmonary artery systolic  pressure.   3. The mitral valve is abnormal. Mild mitral valve regurgitation. No  evidence of mitral stenosis.   4. The tricuspid valve is abnormal.   5. The aortic valve is tricuspid. Aortic valve regurgitation is not  visualized. No aortic stenosis is present.   6. The inferior vena cava is  normal in size with greater than 50%  respiratory variability, suggesting right atrial pressure of 3 mmHg.   Risk Assessment/Calculations:    CHA2DS2-VASc Score = 6   This indicates a 9.7% annual risk of stroke. The patient's score is based upon: CHF History: 0 HTN History: 1 Diabetes History: 1 Stroke History: 0 Vascular Disease History: 1 Age Score: 2 Gender Score: 1     Physical Exam:   VS:  BP (!) 144/62   Pulse 61   Ht 5' 4 (1.626 m)   Wt 174 lb 6.4 oz (79.1 kg)   SpO2 99%   BMI 29.94 kg/m    Wt Readings from Last 3 Encounters:  07/03/24 174 lb 6.4 oz (79.1 kg)  06/12/24 178 lb 12.8 oz (81.1 kg)  05/15/24 176 lb 6.4 oz (80 kg)     GEN: Well nourished, well developed in no acute distress NECK: No JVD CARDIAC: Bradycardic, regular RESPIRATORY:  Clear to auscultation without rales, wheezing or rhonchi  ABDOMEN: Soft, non-distended EXTREMITIES:  No edema; No deformity   ASSESSMENT AND PLAN:    I have seen Robyn Hill in the office today who is being considered for a Watchman left atrial appendage closure device. I believe they will benefit from this procedure given their history of atrial fibrillation, CHA2DS2-VASc score of 6 and unadjusted ischemic stroke rate of 9.7% per year. Unfortunately, the patient is not felt to be a long term anticoagulation candidate secondary to recurrent GI bleeding. The patient's chart has been reviewed and I feel that they  would be a candidate for short term oral anticoagulation after Watchman implant.   It is my belief that after undergoing a LAA closure procedure, LATIVIA Hill will not need long term anticoagulation which eliminates anticoagulation side effects and major bleeding risk.   Procedural risks for the Watchman implant have been reviewed with the patient including a 0.5% risk of stroke, <1% risk of perforation and <1% risk of device embolization. Other risks include bleeding, vascular damage, tamponade, worsening renal  function, and death. The patient understands these risk and wishes to proceed.     The published clinical data on the safety and effectiveness of WATCHMAN include but are not limited to the following: - Holmes DR, Jess BEARD, Sick P et al. for the PROTECT AF Investigators. Percutaneous closure of the left atrial appendage versus warfarin therapy for prevention of stroke in patients with atrial fibrillation: a randomised non-inferiority trial. Lancet 2009; 374: 534-42. GLENWOOD Jess BEARD, Doshi SK, Jonita VEAR Satchel D et al. on behalf of the PROTECT AF Investigators. Percutaneous Left Atrial Appendage Closure for Stroke Prophylaxis in Patients With Atrial Fibrillation 2.3-Year Follow-up of the PROTECT AF (Watchman Left Atrial Appendage System for Embolic Protection in Patients With Atrial Fibrillation) Trial. Circulation 2013; 127:720-729. - Alli O, Doshi S,  Kar S, Reddy VY, Sievert H et al. Quality of Life Assessment in the Randomized PROTECT AF (Percutaneous Closure of the Left Atrial Appendage Versus Warfarin Therapy for Prevention of Stroke in Patients With Atrial Fibrillation) Trial of Patients at Risk for Stroke With Nonvalvular Atrial Fibrillation. J Am Coll Cardiol 2013; 61:1790-8. GLENWOOD Satchel DR, Archer RAMAN, Price M, Whisenant B, Sievert H, Doshi S, Huber K, Reddy V. Prospective randomized evaluation of the Watchman left atrial appendage Device in patients with atrial fibrillation versus long-term warfarin therapy; the PREVAIL trial. Journal of the [employer] of Cardiology, Vol. 4, No. 1, 2014, 1-11. - Kar S, Doshi SK, Sadhu A, Horton R, Osorio J et al. Primary outcome evaluation of a next-generation left atrial appendage closure device: results from the PINNACLE FLX trial. Circulation 2021;143(18)1754-1762.   HAS-BLED score 3 Hypertension Yes  Abnormal renal and liver function (Dialysis, transplant, Cr >2.26 mg/dL /Cirrhosis or Bilirubin >2x Normal or AST/ALT/AP >3x Normal) No  Stroke No  Bleeding Yes   Labile INR (Unstable/high INR) No  Elderly (>65) Yes  Drugs or alcohol (>= 8 drinks/week, anti-plt or NSAID) No   CHA2DS2-VASc Score = 6  The patient's score is based upon: CHF History: 0 HTN History: 1 Diabetes History: 1 Stroke History: 0 Vascular Disease History: 1 Age Score: 2 Gender Score: 1       ASSESSMENT AND PLAN: Mrs. Oliveto is a 78 year old female with past medical history notable for paroxysmal atrial fibrillation with a CHA2DS2-VASc score of 6 and history of GI bleeding who presents today to discuss watchman implant and treatment of her atrial fibrillation.  Patient is very concerned about recurrence of GI bleeding while on Eliquis .  I think she would be an appropriate candidate for Watchman device.  She also has paroxysmal atrial fibrillation, for this reason I think performing a concomitant ablation procedure at time of watchman implant would be reasonable.  Paroxysmal Atrial Fibrillation: Has paroxysms of atrial fibrillation every few weeks.  Most recently on 9/6-9/7.  Reviewed Apple Watch tracings which were consistent with atrial fibrillation. -Discussed treatment options today for AF including antiarrhythmic drug therapy and ablation. Discussed risks, recovery and likelihood of success with each treatment strategy. Risk, benefits, and alternatives  to EP study and ablation for afib were discussed. These risks include but are not limited to stroke, bleeding, vascular damage, tamponade, perforation, damage to the esophagus, lungs, phrenic nerve and other structures, pulmonary vein stenosis, worsening renal function, coronary vasospasm and death.  Discussed potential need for repeat ablation procedures and antiarrhythmic drugs after an initial ablation. The patient understands these risk and wishes to think about her options.  If she chooses to pursue catheter ablation then we will obtain a gated CT scan of the chest with contrast timed for PV/LA visualization.  -Continue  diltiazem .    Secondary Hypercoagulable State  GI bleeding -The patient is at significant risk for stroke/thromboembolism based upon her CHA2DS2-VASc Score of 6.  Continue Apixaban  (Eliquis ).  -After today's visit with the patient which included shared decision making visit regarding LAA closure device, the patient wants to think about her options.  If she chooses to pursue watchman implant then we will obtain a gated CT scan of the chest with contrast timed for PV/LA visualization.   Hypertension -Above goal today.  Recommend checking blood pressures 1-2 times per week at home and recording the values.  Recommend bringing these recordings to the primary care physician.  Follow up with Dr. Kennyth if patient chooses to pursue catheter ablation and/or Watchman device implant.   Signed, Fonda Kennyth, MD

## 2024-07-03 ENCOUNTER — Encounter: Payer: Self-pay | Admitting: Cardiology

## 2024-07-03 ENCOUNTER — Ambulatory Visit: Attending: Cardiology | Admitting: Cardiology

## 2024-07-03 VITALS — BP 144/62 | HR 61 | Ht 64.0 in | Wt 174.4 lb

## 2024-07-03 DIAGNOSIS — K922 Gastrointestinal hemorrhage, unspecified: Secondary | ICD-10-CM | POA: Diagnosis not present

## 2024-07-03 DIAGNOSIS — I480 Paroxysmal atrial fibrillation: Secondary | ICD-10-CM

## 2024-07-03 DIAGNOSIS — D6869 Other thrombophilia: Secondary | ICD-10-CM | POA: Diagnosis not present

## 2024-07-03 DIAGNOSIS — I10 Essential (primary) hypertension: Secondary | ICD-10-CM | POA: Diagnosis not present

## 2024-07-03 NOTE — Patient Instructions (Signed)
 Medication Instructions:  Your physician recommends that you continue on your current medications as directed. Please refer to the Current Medication list given to you today.  *If you need a refill on your cardiac medications before your next appointment, please call your pharmacy*  Testing/Procedures:  Ablation Your physician has recommended that you have an ablation. Catheter ablation is a medical procedure used to treat some cardiac arrhythmias (irregular heartbeats). During catheter ablation, a long, thin, flexible tube is put into a blood vessel in your groin (upper thigh), or neck. This tube is called an ablation catheter. It is then guided to your heart through the blood vessel. Radio frequency waves destroy small areas of heart tissue where abnormal heartbeats may cause an arrhythmia to start.  Watchman  Your physician has requested that you have Left atrial appendage (LAA) closure device implantation is a procedure to put a small device in the LAA of the heart. The LAA is a small sac in the wall of the heart's left upper chamber. Blood clots can form in this area. The device, Watchman closes the LAA to help prevent a blood clot and stroke.   Please give us  a call if you would like to proceed with an ablation and watchman implant.  Follow-Up: At [facility], you and your health needs are our priority.  As part of our continuing mission to provide you with exceptional heart care, our providers are all part of one team.  This team includes your primary Cardiologist (physician) and Advanced Practice Providers or APPs (Physician Assistants and Nurse Practitioners) who all work together to provide you with the care you need, when you need it.

## 2024-07-04 ENCOUNTER — Telehealth: Payer: Self-pay | Admitting: Internal Medicine

## 2024-07-04 NOTE — Telephone Encounter (Signed)
 Spoke to Robyn Hill and she is very concerned about her mother. Her gait is off, she is having more headaches, she has noticed a big difference in her memory. She is not remembering people and what she is saying during a sentence. She looked back at her MRI in 02/2022 and is just very concerned with what it said. They don't have an appointment until December 10th with neurology. She would like you to look back at MRI and give some advise. I will see if we can move up her neurology appt to a sooner appt.   Caryn- can we see if neurology will see pt sooner than December 10th due to worsening memory issues    Copied from CRM #[id number]. Topic: Clinical - Medical Advice >> Jul 04, 2024  8:27 AM Carlatta H wrote: Reason for CRM: Please call the patients daughter Madelin about findings from previous MRI// Memory loss progression//[phone]

## 2024-07-07 ENCOUNTER — Other Ambulatory Visit: Payer: Self-pay | Admitting: Medical

## 2024-07-11 ENCOUNTER — Other Ambulatory Visit: Payer: Self-pay

## 2024-07-11 DIAGNOSIS — D500 Iron deficiency anemia secondary to blood loss (chronic): Secondary | ICD-10-CM

## 2024-07-11 DIAGNOSIS — K921 Melena: Secondary | ICD-10-CM

## 2024-07-11 DIAGNOSIS — D638 Anemia in other chronic diseases classified elsewhere: Secondary | ICD-10-CM

## 2024-07-12 ENCOUNTER — Encounter: Payer: Self-pay | Admitting: Cardiology

## 2024-07-12 ENCOUNTER — Other Ambulatory Visit

## 2024-07-12 DIAGNOSIS — D638 Anemia in other chronic diseases classified elsewhere: Secondary | ICD-10-CM

## 2024-07-12 DIAGNOSIS — D500 Iron deficiency anemia secondary to blood loss (chronic): Secondary | ICD-10-CM | POA: Diagnosis not present

## 2024-07-12 DIAGNOSIS — K921 Melena: Secondary | ICD-10-CM | POA: Diagnosis not present

## 2024-07-13 LAB — CBC WITH DIFFERENTIAL/PLATELET
Basophils Absolute: 0.1 x10E3/uL (ref 0.0–0.2)
Basos: 1 %
EOS (ABSOLUTE): 0.5 x10E3/uL — ABNORMAL HIGH (ref 0.0–0.4)
Eos: 6 %
Hematocrit: 37.9 % (ref 34.0–46.6)
Hemoglobin: 11.9 g/dL (ref 11.1–15.9)
Immature Grans (Abs): 0 x10E3/uL (ref 0.0–0.1)
Immature Granulocytes: 0 %
Lymphocytes Absolute: 1.9 x10E3/uL (ref 0.7–3.1)
Lymphs: 23 %
MCH: 27.7 pg (ref 26.6–33.0)
MCHC: 31.4 g/dL — ABNORMAL LOW (ref 31.5–35.7)
MCV: 88 fL (ref 79–97)
Monocytes Absolute: 0.6 x10E3/uL (ref 0.1–0.9)
Monocytes: 8 %
Neutrophils Absolute: 4.9 x10E3/uL (ref 1.4–7.0)
Neutrophils: 62 %
Platelets: 280 x10E3/uL (ref 150–450)
RBC: 4.29 x10E6/uL (ref 3.77–5.28)
RDW: 15.2 % (ref 11.7–15.4)
WBC: 8 x10E3/uL (ref 3.4–10.8)

## 2024-07-13 LAB — IRON: Iron: 90 ug/dL (ref 27–139)

## 2024-07-13 LAB — FERRITIN: Ferritin: 379 ng/mL — AB (ref 15–150)

## 2024-07-17 ENCOUNTER — Ambulatory Visit: Payer: Self-pay | Admitting: Medical

## 2024-07-17 ENCOUNTER — Other Ambulatory Visit: Payer: Self-pay | Admitting: Internal Medicine

## 2024-07-17 ENCOUNTER — Encounter: Payer: Self-pay | Admitting: Cardiology

## 2024-07-17 DIAGNOSIS — E118 Type 2 diabetes mellitus with unspecified complications: Secondary | ICD-10-CM

## 2024-07-17 NOTE — Progress Notes (Signed)
 Robyn Hill we request an earlier appointment with neurology than December.  Can we also get her set up for pharmacy consult with Jon.  This possibly could be virtual or in person.  If easier for them it can be televisit  Thankfully the iron  and iron  stores are up to a good point.  Hemoglobin has actually improved as well  How are her blood sugars in the last few days?  If she is still taking iron  twice a day she can go to once daily

## 2024-07-19 ENCOUNTER — Telehealth: Payer: Self-pay

## 2024-07-19 NOTE — Progress Notes (Unsigned)
 Care Guide Pharmacy Note  07/19/2024 Name: Robyn Hill MRN: [id number] DOB: 05/28/1946  Referred By: Bulah Alm RAMAN, PA-C Reason for referral: Complex Care Management and Call Attempt #1 (Unsuccessful initial outreach to schedule with PHARM D- Jon)   Robyn Hill is a 78 y.o. year old female who is a primary care patient of Bulah Alm RAMAN DEVONNA.  Robyn Hill was referred to the pharmacist for assistance related to: DMII  An unsuccessful telephone outreach was attempted today to contact the patient who was referred to the pharmacy team for assistance with medication assistance. Additional attempts will be made to contact the patient.  Leotis Rase [facility], [facility] Guide  Direct Dial : [phone]  Fax [phone]

## 2024-07-20 NOTE — Progress Notes (Unsigned)
 Care Guide Pharmacy Note  07/20/2024 Name: [name] MRN: [id number] DOB: [birth year]  Referred By: Bulah Alm RAMAN, PA-C Reason for referral: Complex Care Management, Call Attempt #1 (Unsuccessful initial outreach to schedule with PHARM DGLENWOOD [place]), and Call Attempt #2 (Unsuccessful initial outreach to schedule with PHARM D- [place] )   Robyn Hill is a 78 y.o. year old female who is a primary care patient of Bulah Alm RAMAN DEVONNA.  [name] was referred to the pharmacist for assistance related to: DMII  A second unsuccessful telephone outreach was attempted today to contact the patient who was referred to the pharmacy team for assistance with medication assistance. Additional attempts will be made to contact the patient.  Leotis Rase [facility], [facility] Guide  Direct Dial : [phone]  Fax [phone]

## 2024-07-21 NOTE — Progress Notes (Signed)
 Care Guide Pharmacy Note  07/21/2024 Name: Robyn Hill MRN: [id number] DOB: [birth year]  Referred By: Bulah Alm RAMAN, PA-C Reason for referral: Complex Care Management, Call Attempt #1 (Unsuccessful initial outreach to schedule with PHARM DGLENWOOD [place]), Call Attempt #2 (Unsuccessful initial outreach to schedule with PHARM DGLENWOOD [place] ), and Call Attempt #3 (Unsuccessful initial outreach to schedule with PHARM D-Angela)   Robyn Hill is a 78 y.o. year old female who is a primary care patient of Tysinger, David S, PA-C.  Robyn Hill was referred to the pharmacist for assistance related to: DMII  A third unsuccessful telephone outreach was attempted today to contact the patient who was referred to the pharmacy team for assistance with medication assistance. The Population Health team is pleased to engage with this patient at any time in the future upon receipt of referral and should he/she be interested in assistance from the [facility] team.  Leotis Rase [facility]  [facility], [facility] Guide  Direct Dial : [phone]  Fax [phone]

## 2024-07-24 ENCOUNTER — Telehealth: Payer: Self-pay

## 2024-07-24 NOTE — Telephone Encounter (Signed)
 Spoke with the patient's daughter at length. Apologized for the delay as I thought the patient/family were thinking about the procedure and were going to call if it was decided to proceed.  She reported the patient wants to have both the ablation and Watchman. She understood the CT needs to be completed to ensure anatomy is suitable for LAAO. Scheduled CT 08/02/2024. Instructions sent via MyChart. She was grateful for call and agreed with plan.

## 2024-07-27 ENCOUNTER — Other Ambulatory Visit: Payer: Self-pay | Admitting: Medical

## 2024-08-02 ENCOUNTER — Ambulatory Visit

## 2024-08-02 ENCOUNTER — Ambulatory Visit: Payer: Self-pay | Admitting: Cardiology

## 2024-08-02 ENCOUNTER — Ambulatory Visit (HOSPITAL_COMMUNITY)
Admission: RE | Admit: 2024-08-02 | Discharge: 2024-08-02 | Disposition: A | Discharge status: Home / Self Care | Source: Ambulatory Visit | Attending: Cardiology | Admitting: Cardiology

## 2024-08-02 DIAGNOSIS — R931 Abnormal findings on diagnostic imaging of heart and coronary circulation: Secondary | ICD-10-CM

## 2024-08-02 DIAGNOSIS — Z23 Encounter for immunization: Secondary | ICD-10-CM | POA: Diagnosis not present

## 2024-08-02 DIAGNOSIS — R0609 Other forms of dyspnea: Secondary | ICD-10-CM

## 2024-08-02 DIAGNOSIS — Q2112 Patent foramen ovale: Secondary | ICD-10-CM | POA: Insufficient documentation

## 2024-08-02 DIAGNOSIS — I480 Paroxysmal atrial fibrillation: Secondary | ICD-10-CM | POA: Insufficient documentation

## 2024-08-02 LAB — POCT I-STAT CREATININE: Creatinine, Ser: 0.9 mg/dL (ref 0.44–1.00)

## 2024-08-02 MED ORDER — IOHEXOL 350 MG/ML SOLN
80.0000 mL | Freq: Once | INTRAVENOUS | Status: AC | PRN
  Administered 2024-08-02: 80 mL via INTRAVENOUS

## 2024-08-04 ENCOUNTER — Other Ambulatory Visit: Payer: Self-pay | Admitting: Medical

## 2024-08-04 MED ORDER — HYDROCORTISONE (PERIANAL) 2.5 % EX CREA: TOPICAL_CREAM | Freq: Two times a day (BID) | CUTANEOUS | 1 refills | Status: DC | PRN

## 2024-08-06 ENCOUNTER — Other Ambulatory Visit: Payer: Self-pay | Admitting: Medical

## 2024-08-09 ENCOUNTER — Encounter: Payer: Self-pay | Admitting: Internal Medicine

## 2024-08-11 ENCOUNTER — Telehealth: Payer: Self-pay

## 2024-08-11 NOTE — Progress Notes (Signed)
   08/11/2024  Patient ID: [name], female   DOB: Aug 14, 1946, 78 y.o.   MRN: [id number]  Pharmacy Quality Measure Review  This patient is appearing on a report for being at risk of failing the adherence measure for hypertension (ACEi/ARB) medications this calendar year.   Medication: Enalapril  Last fill date: 07/24/24 for 30 day supply  Left voicemail for patient to return my call at their convenience. Chart review shows that at recent visit, it was reported patient may not be taking. Need to clarify  [name], PharmD Clinical Pharmacist [phone]

## 2024-08-14 NOTE — Progress Notes (Signed)
 Robyn Hill                                          MRN: [id number]   08/14/2024   The VBCI Quality Team Specialist reviewed this patient medical record for the purposes of chart review for care gap closure. The following were reviewed: chart review for care gap closure-kidney health evaluation for diabetes:eGFR  and uACR.    VBCI Quality Team

## 2024-08-23 ENCOUNTER — Other Ambulatory Visit: Payer: Self-pay

## 2024-08-23 ENCOUNTER — Telehealth: Payer: Self-pay

## 2024-08-23 DIAGNOSIS — I480 Paroxysmal atrial fibrillation: Secondary | ICD-10-CM

## 2024-08-23 DIAGNOSIS — K625 Hemorrhage of anus and rectum: Secondary | ICD-10-CM

## 2024-08-23 DIAGNOSIS — K922 Gastrointestinal hemorrhage, unspecified: Secondary | ICD-10-CM

## 2024-08-23 NOTE — Telephone Encounter (Signed)
 Spoke with the patient at length.  She understood a nuclear stress test will be ordered due to elevated calcium  score. When asked about symptoms, she did report she has DOE occasionally.  Upon further discussion, the patient has an MRI scheduled 12/15 to assess back pain. She has had several back surgeries in the past and stated she lives in excruciation pain. She understood that if any intervention is pursued on her back, either ablation/LAAO would have to be postponed OR back procedure would need to be put on hold. Due to pain, she elected to prioritize her back and wishes to postpone plans for concomitant to 12/19/2024. Scheduled her a visit with Dr. Kennyth on 12/06/2024 for final assessment prior to procedure.  Will reach out to the patient after her MRI 10/02/2024 for an update.   Stress test ordered and instructions sent via MyChart. Will order Lexiscan  due to her back issues. She was grateful for call and agreed with plan.

## 2024-08-23 NOTE — Telephone Encounter (Signed)
 SABRA

## 2024-08-24 ENCOUNTER — Telehealth (HOSPITAL_COMMUNITY): Payer: Self-pay | Admitting: *Deleted

## 2024-08-24 NOTE — Telephone Encounter (Signed)
 Left a detailed message with instructions regarding a STRESS TEST on 08/28/24 at 10:30.

## 2024-08-25 ENCOUNTER — Other Ambulatory Visit: Payer: Self-pay | Admitting: Cardiology

## 2024-08-25 ENCOUNTER — Telehealth: Payer: Self-pay

## 2024-08-25 DIAGNOSIS — R0609 Other forms of dyspnea: Secondary | ICD-10-CM

## 2024-08-25 DIAGNOSIS — R931 Abnormal findings on diagnostic imaging of heart and coronary circulation: Secondary | ICD-10-CM

## 2024-08-25 NOTE — Telephone Encounter (Signed)
 Pectinated chicken wing Max 26/ AVG 24/ Depth 16.3 Likely need a 31 mm device  Inf/Mid TSP RAO 19 CAU 20

## 2024-08-28 ENCOUNTER — Ambulatory Visit (HOSPITAL_COMMUNITY)
Admission: RE | Admit: 2024-08-28 | Discharge: 2024-08-28 | Disposition: A | Discharge status: Home / Self Care | Source: Ambulatory Visit | Attending: Cardiovascular Disease | Admitting: Cardiovascular Disease

## 2024-08-28 ENCOUNTER — Other Ambulatory Visit: Payer: Self-pay | Admitting: Medical

## 2024-08-28 DIAGNOSIS — R0609 Other forms of dyspnea: Secondary | ICD-10-CM | POA: Diagnosis present

## 2024-08-28 DIAGNOSIS — R931 Abnormal findings on diagnostic imaging of heart and coronary circulation: Secondary | ICD-10-CM | POA: Insufficient documentation

## 2024-08-28 LAB — MYOCARDIAL PERFUSION IMAGING
LV dias vol: 94 mL (ref 46–106)
LV sys vol: 26 mL (ref 3.8–5.2)
Nuc Stress EF: 72 %
Peak HR: 105 {beats}/min
Rest HR: 81 {beats}/min
Rest Nuclear Isotope Dose: 10.8 mCi
SDS: 1
SRS: 2
SSS: 1
ST Depression (mm): 0 mm
Stress Nuclear Isotope Dose: 31.4 mCi
TID: 0.9

## 2024-08-28 MED ORDER — REGADENOSON 0.4 MG/5ML IV SOLN
0.4000 mg | Freq: Once | INTRAVENOUS | Status: AC
  Administered 2024-08-28: 0.4 mg via INTRAVENOUS

## 2024-08-28 MED ORDER — TECHNETIUM TC 99M TETROFOSMIN IV KIT
31.4000 | PACK | Freq: Once | INTRAVENOUS | Status: AC | PRN
Start: 2024-08-28 — End: 2024-08-28
  Administered 2024-08-28: 31.4 via INTRAVENOUS

## 2024-08-28 MED ORDER — TECHNETIUM TC 99M TETROFOSMIN IV KIT
10.8000 | PACK | Freq: Once | INTRAVENOUS | Status: AC | PRN
  Administered 2024-08-28: 10.8 via INTRAVENOUS

## 2024-08-28 MED ORDER — REGADENOSON 0.4 MG/5ML IV SOLN
INTRAVENOUS | Status: AC
  Filled 2024-08-28: qty 5

## 2024-08-31 ENCOUNTER — Other Ambulatory Visit: Payer: Self-pay | Admitting: Medical

## 2024-09-04 ENCOUNTER — Ambulatory Visit: Payer: Self-pay | Admitting: Cardiology

## 2024-09-05 NOTE — Telephone Encounter (Signed)
 Spoke with patient. She saw results online. Advised patient to notify office if she needs cardiac clearance for any upcoming procedures.

## 2024-09-15 ENCOUNTER — Other Ambulatory Visit: Payer: Self-pay | Admitting: Medical Genetics

## 2024-09-20 ENCOUNTER — Other Ambulatory Visit (HOSPITAL_COMMUNITY)
Admission: RE | Admit: 2024-09-20 | Discharge: 2024-09-20 | Disposition: A | Payer: Self-pay | Source: Ambulatory Visit | Attending: Oncology | Admitting: Oncology

## 2024-09-21 ENCOUNTER — Ambulatory Visit: Payer: Self-pay

## 2024-09-21 ENCOUNTER — Other Ambulatory Visit: Payer: Self-pay | Admitting: Neurosurgery

## 2024-09-21 DIAGNOSIS — Z0000 Encounter for general adult medical examination without abnormal findings: Secondary | ICD-10-CM | POA: Diagnosis not present

## 2024-09-21 DIAGNOSIS — M47812 Spondylosis without myelopathy or radiculopathy, cervical region: Secondary | ICD-10-CM

## 2024-09-21 NOTE — Progress Notes (Signed)
 Chief Complaint  Patient presents with   Medicare Wellness     Subjective:   Robyn Hill is a 78 y.o. female who presents for a Medicare Annual Wellness Visit.  Visit info / Clinical Intake: Medicare Wellness Visit Type:: Subsequent Annual Wellness Visit Persons participating in visit and providing information:: patient Medicare Wellness Visit Mode:: Video Since this visit was completed virtually, some vitals may be partially provided or unavailable. Missing vitals are due to the limitations of the virtual format.: Unable to obtain vitals - no equipment If Telephone or Video please confirm:: I connected with patient using audio/video enable telemedicine. I verified patient identity with two identifiers, discussed telehealth limitations, and patient agreed to proceed. Patient Location:: home Provider Location:: home office Interpreter Needed?: No Pre-visit prep was completed: yes AWV questionnaire completed by patient prior to visit?: yes Date:: 09/20/24 Living arrangements:: (Proxy-Rptd) with family/others Patient's Overall Health Status Rating: (Proxy-Rptd) good Typical amount of pain: (!) (Proxy-Rptd) a lot Does pain affect daily life?: (!) (Proxy-Rptd) yes Are you currently prescribed opioids?: (!) yes  Dietary Habits and Nutritional Risks How many meals a day?: (!) (Proxy-Rptd) 1 Eats fruit and vegetables daily?: yes Most meals are obtained by: (Proxy-Rptd) preparing own meals In the last 2 weeks, have you had any of the following?: none Diabetic:: (!) yes Any non-healing wounds?: no How often do you check your BS?: continuous glucose monitor Would you like to be referred to a Nutritionist or for Diabetic Management? : no  Functional Status Activities of Daily Living (to include ambulation/medication): (Proxy-Rptd) Independent Ambulation: (Proxy-Rptd) Independent Medication Administration: (Proxy-Rptd) Independent Home Management (perform basic housework or  laundry): (Proxy-Rptd) Independent Manage your own finances?: (Proxy-Rptd) yes Primary transportation is: (Proxy-Rptd) family / friends Concerns about vision?: (!) yes (blind in one eye and partially blind in other)  Fall Screening Falls in the past year?: (Proxy-Rptd) 0 Number of falls in past year: 0 Was there an injury with Fall?: 0 Fall Risk Category Calculator: 0 Patient Fall Risk Level: Low Fall Risk  Fall Risk Patient at Risk for Falls Due to: Impaired vision Fall risk Follow up: Falls evaluation completed; Falls prevention discussed  Home and Transportation Safety: All rugs have non-skid backing?: (Proxy-Rptd) yes All stairs or steps have railings?: (Proxy-Rptd) yes Grab bars in the bathtub or shower?: (Proxy-Rptd) yes Have non-skid surface in bathtub or shower?: (!) (Proxy-Rptd) no Good home lighting?: (Proxy-Rptd) yes Regular seat belt use?: (Proxy-Rptd) yes Hospital stays in the last year:: (!) (Proxy-Rptd) yes How many hospital stays:: 1 (had rectal bleeding)  Cognitive Assessment Difficulty concentrating, remembering, or making decisions? : (Proxy-Rptd) yes Will 6CIT or Mini Cog be Completed: no 6CIT or Mini Cog Declined: patient has a diagnosis of dementia or cognitive impairment  Advance Directives (For Healthcare) Does Patient Have a Medical Advance Directive?: Yes Does patient want to make changes to medical advance directive?: No - Patient declined Type of Advance Directive: Healthcare Power of [place]; Living will Copy of Healthcare Power of Attorney in Chart?: No - copy requested Copy of Living Will in Chart?: No - copy requested Would patient like information on creating a medical advance directive?: No - Patient declined  Reviewed/Updated  Reviewed/Updated: Reviewed All (Medical, Surgical, Family, Medications, Allergies, Care Teams, Patient Goals)    Allergies (verified) Tape, Kiwi extract, Lyrica [pregabalin], Sunflowerseed oil, Oxycodone  hcl,  Hydrocodone -acetaminophen , Invokana  [canagliflozin ], Neurontin [gabapentin], and Nitrofuran derivatives   Current Medications (verified) Outpatient Encounter Medications as of 09/21/2024  Medication Sig   apixaban  (  ELIQUIS ) 5 MG TABS tablet TAKE 1 TABLET BY MOUTH TWICE DAILY   atorvastatin  (LIPITOR) 80 MG tablet TAKE 1 TABLET BY MOUTH AT BEDTIME   brimonidine  (ALPHAGAN ) 0.2 % ophthalmic solution Place 1 drop into both eyes 2 (two) times daily.   COMFORT EZ PEN NEEDLES 31G X 5 MM MISC USE AS DIRECTED TO INJECT TOUJEO  DAILY   Continuous Glucose Sensor (FREESTYLE LIBRE 3 PLUS SENSOR) MISC place one sensor EVERY 15 DAYS.   dexlansoprazole  (DEXILANT ) 60 MG capsule TAKE ONE CAPSULE BY MOUTH TWICE DAILY   diltiazem  (CARDIZEM ) 60 MG tablet Take 1 tablet (60 mg total) by mouth 3 (three) times daily.   dorzolamide -timolol  (COSOPT ) 22.3-6.8 MG/ML ophthalmic solution Place 1 drop into both eyes 2 (two) times daily.   Dulaglutide  (TRULICITY ) 3 MG/0.5ML SOAJ Inject 3 mg into the skin once a week.   DULoxetine  (CYMBALTA ) 60 MG capsule TAKE 1 CAPSULE BY MOUTH TWICE DAILY   enalapril  (VASOTEC ) 20 MG tablet TAKE 1 TABLET BY MOUTH DAILY   EQ FIBER SUPPLEMENT PO Taking 3 chewables by mouth daily   ferrous gluconate  (FERGON) 324 MG tablet Take 1 tablet (324 mg total) by mouth 2 (two) times daily with a meal. (Patient taking differently: Take 324 mg by mouth 3 (three) times daily with meals.)   fluocinolone  0.01 % cream APPLY TO THE AFFECTED AREA(S) OF THE LEGS AND ARMS AT BEDTIME AS NEEDED.   hydrochlorothiazide  (HYDRODIURIL ) 25 MG tablet TAKE 1 TABLET BY MOUTH DAILY   insulin  glargine, 1 Unit Dial , (TOUJEO  SOLOSTAR) 300 UNIT/ML Solostar Pen Inject 20 Units into the skin daily.   latanoprost  (XALATAN ) 0.005 % ophthalmic solution Place 1 drop into both eyes at bedtime.   LINZESS  72 MCG capsule TAKE ONE CAPSULE BY MOUTH EVERY DAY BEFORE breakfast   Magnesium Citrate (MAGNESIUM GUMMIES PO) Take 500 mg by mouth daily.    metFORMIN  (GLUCOPHAGE ) 500 MG tablet TAKE 1 TABLET BY MOUTH TWICE DAILY WITH MEALS   morphine  (MSIR) 15 MG tablet Take 15 mg by mouth every 4 (four) hours.   Multiple Vitamins-Minerals (CENTRUM SILVER  50+WOMEN) TABS Take 2 capsules by mouth every morning.   Multiple Vitamins-Minerals (PRESERVISION AREDS 2) CAPS Take 2 capsules by mouth every morning.   nortriptyline  (PAMELOR ) 10 MG capsule Take 10 mg by mouth daily as needed for sleep. (Patient taking differently: Take 10 mg by mouth as needed for sleep.)   PROCTO-MED HC  2.5 % rectal cream place RECTALLY TWICE DAILY AS NEEDED FOR HEMORRHOIDS OR anal ITCHING.   triamcinolone  cream (KENALOG ) 0.1 % Apply 1 Application topically daily as needed (for itching- affected sites).   No facility-administered encounter medications on file as of 09/21/2024.    History: Past Medical History:  Diagnosis Date   Adrenal mass, right    Allergy    Anemia    Asthma    as a teenager   Blind right eye    since childhood   Blood transfusion 1970   Chronic back pain    Chronic diarrhea    Colestipol  therapy   Depression    Diverticulitis    Diverticulosis    Fatty liver    Female bladder prolapse    GERD (gastroesophageal reflux disease)    Glaucoma    H/O bone density study 11/29/2014   normal study although mild decreased in density from prior study   H/O hiatal hernia    Hemorrhoids    History of kidney stones    History of uterine cancer  s/p hysterectomy   Hyperlipidemia    Hypertension    Migraine    hx of migraines in past    Neuromuscular disorder (HCC)    diabetic neuropahthy   Numbness and tingling of both legs    Osteoarthritis    Rheumatic fever    age 78   Type II diabetes mellitus (HCC)    Urinary incontinence    Vitamin D  deficiency    Past Surgical History:  Procedure Laterality Date   ABDOMINAL HYSTERECTOMY  10/19/1978   no further pap smears needed   ANTERIOR AND POSTERIOR REPAIR N/A 01/29/2015   Procedure:  CYSTOCELE AND RECTOCELE ;  Surgeon: Glendia Elizabeth, MD;  Location: [facility];  Service: Urology;  Laterality: N/A;   BACK SURGERY     BREAST BIOPSY  08/19/1997   right   CARDIAC CATHETERIZATION  11/19/2006   CHOLECYSTECTOMY     COLONOSCOPY  10/19/2012   polyps, external hemorrhoids   COLONOSCOPY WITH PROPOFOL  N/A 11/12/2023   Procedure: COLONOSCOPY WITH PROPOFOL ;  Surgeon: Elicia Claw, MD;  Location: [facility];  Service: Gastroenterology;  Laterality: N/A;   CYSTOSCOPY N/A 01/29/2015   Procedure: CYSTOSCOPY;  Surgeon: Glendia Elizabeth, MD;  Location: [facility];  Service: Urology;  Laterality: N/A;   DILATION AND CURETTAGE OF UTERUS  10/19/1978   EDSI for back pain  last 01/2009   multiple x 5   ESOPHAGOGASTRODUODENOSCOPY (EGD) WITH PROPOFOL  N/A 11/11/2023   Procedure: ESOPHAGOGASTRODUODENOSCOPY (EGD) WITH PROPOFOL ;  Surgeon: Elicia Claw, MD;  Location: [facility];  Service: Gastroenterology;  Laterality: N/A;   EYE SURGERY     Bil Blind R eye   HEMOSTASIS CLIP PLACEMENT  11/12/2023   Procedure: HEMOSTASIS CLIP PLACEMENT;  Surgeon: Elicia Claw, MD;  Location: [facility];  Service: Gastroenterology;;   HOT HEMOSTASIS N/A 11/11/2023   Procedure: HOT HEMOSTASIS (ARGON PLASMA COAGULATION/BICAP);  Surgeon: Elicia Claw, MD;  Location: [facility];  Service: Gastroenterology;  Laterality: N/A;   KIDNEY STONE SURGERY  10/19/1977   LIVER BIOPSY  04/19/1999   minor laceration repair     right eye   POLYPECTOMY  11/12/2023   Procedure: POLYPECTOMY;  Surgeon: Elicia Claw, MD;  Location: [facility];  Service: Gastroenterology;;   POSTERIOR FUSION LUMBAR SPINE  04/05/2012   PULSE GENERATOR IMPLANT Right 03/12/2016   Procedure: Right Reposition of implantable pulse generator;  Surgeon: Fairy Levels, MD;  Location: [facility];  Service: Neurosurgery;  Laterality: Right;  Right Reposition of implantable pulse generator   SPINAL CORD STIMULATOR INSERTION N/A 09/06/2015    Procedure: LUMBAR SPINAL CORD STIMULATOR INSERTION;  Surgeon: Deward Fabian, MD;  Location: [facility];  Service: Neurosurgery;  Laterality: N/A;  Spinal Cord Stimulator placement   SPINAL CORD STIMULATOR INSERTION N/A 10/22/2015   Procedure: Laminectomy for spinal cord stimulator paddle lead and implantable generator placement;  Surgeon: Fairy Levels, MD;  Location: [facility];  Service: Neurosurgery;  Laterality: N/A;  Laminectomy for spinal cord stimulator paddle lead and implantable generator placement   steroid injections of hip  last 02/2009   multiple x 4   TONSILLECTOMY AND ADENOIDECTOMY  10/19/1950   TRABECULECTOMY  10/19/1994   bilaterally; for glaucoma   UPPER GI ENDOSCOPY  05/21/2014   food, retained in the body of the stomach   VAGINAL PROLAPSE REPAIR N/A 01/29/2015   Procedure:  VAULT PROLAPSE WITH GRAFT ;  Surgeon: Glendia Elizabeth, MD;  Location: [facility];  Service: Urology;  Laterality: N/A;   Family History  Problem Relation Age  of Onset   Heart attack Brother 34   Hypertension Other    Diabetes Other    Cancer Mother 23   Aplastic anemia Mother 18   Heart attack Brother 36   Colon cancer Brother    Heart attack Sister    Heart attack Brother 4   Anesthesia problems Neg Hx    Social History   Occupational History   Occupation: Retired   Tobacco Use   Smoking status: Never   Smokeless tobacco: Never  Vaping Use   Vaping status: Never Used  Substance and Sexual Activity   Alcohol use: Not Currently    Comment: rare   Drug use: No   Sexual activity: Not Currently    Comment: married   Tobacco Counseling Counseling given: Not Answered  SDOH Screenings   Food Insecurity: No Food Insecurity (09/20/2024)  Housing: Low Risk  (09/20/2024)  Transportation Needs: No Transportation Needs (09/20/2024)  Utilities: Not At Risk (09/21/2024)  Alcohol Screen: Low Risk  (09/20/2024)  Depression (PHQ2-9): Low Risk  (09/21/2024)  Financial Resource Strain: Low Risk   (09/20/2024)  Physical Activity: Inactive (09/20/2024)  Social Connections: Unknown (09/20/2024)  Stress: No Stress Concern Present (09/20/2024)  Tobacco Use: Low Risk  (09/21/2024)  Health Literacy: Inadequate Health Literacy (09/21/2024)   See flowsheets for full screening details  Depression Screen PHQ 2 & 9 Depression Scale- Over the past 2 weeks, how often have you been bothered by any of the following problems? Little interest or pleasure in doing things: 0 Feeling down, depressed, or hopeless (PHQ Adolescent also includes...irritable): 0 PHQ-2 Total Score: 0 Trouble falling or staying asleep, or sleeping too much: 0 Feeling tired or having little energy: 3 (thinks due to medication) Poor appetite or overeating (PHQ Adolescent also includes...weight loss): 0 Feeling bad about yourself - or that you are a failure or have let yourself or your family down: 0 Trouble concentrating on things, such as reading the newspaper or watching television (PHQ Adolescent also includes...like school work): 0 Moving or speaking so slowly that other people could have noticed. Or the opposite - being so fidgety or restless that you have been moving around a lot more than usual: 0 Thoughts that you would be better off dead, or of hurting yourself in some way: 0 PHQ-9 Total Score: 3 If you checked off any problems, how difficult have these problems made it for you to do your work, take care of things at home, or get along with other people?: Not difficult at all  Depression Treatment Depression Interventions/Treatment : EYV7-0 Score <4 Follow-up Not Indicated     Goals Addressed             This Visit's Progress    Patient Stated       09/21/2024, wants to have more energy             Objective:    Today's Vitals   There is no height or weight on file to calculate BMI.  Hearing/Vision screen Hearing Screening - Comments:: Has hearing aids that are maintained Vision Screening - Comments::  Regular eye exams, Dr. Lethea Immunizations and Health Maintenance Health Maintenance  Topic Date Due   COVID-19 Vaccine (1) Never done   Zoster Vaccines- Shingrix (1 of 2) 04/29/1965   Diabetic kidney evaluation - Urine ACR  09/22/2024   FOOT EXAM  09/22/2024   HEMOGLOBIN A1C  11/15/2024   Diabetic kidney evaluation - eGFR measurement  05/15/2025   OPHTHALMOLOGY EXAM  06/12/2025  Medicare Annual Wellness (AWV)  09/21/2025   Pneumococcal Vaccine: 50+ Years  Completed   Influenza Vaccine  Completed   Bone Density Scan  Completed   Hepatitis C Screening  Completed   Meningococcal B Vaccine  Aged Out   DTaP/Tdap/Td  Discontinued   Hepatitis B Vaccines 19-59 Average Risk  Discontinued   Mammogram  Discontinued   Colonoscopy  Discontinued        Assessment/Plan:  This is a routine wellness examination for Keneshia.  Patient Care Team: Tysinger, Alm RAMAN, PA-C as PCP - General (Family Medicine) Mallipeddi, Diannah SQUIBB, MD as PCP - Cardiology (Cardiology) Kennyth Chew, MD as PCP - Electrophysiology (Cardiology) Webster Rogue, MD as Consulting Physician MacDiarmid, Glendia, MD as Consulting Physician (Urology) Lavona Agent, MD as Consulting Physician (Cardiology) Darlis Deatrice RAMAN, MD as Consulting Physician (Pain Medicine) Unice Pac, MD as Consulting Physician (Neurosurgery)  I have personally reviewed and noted the following in the patient's chart:   Medical and social history Use of alcohol, tobacco or illicit drugs  Current medications and supplements including opioid prescriptions. Functional ability and status Nutritional status Physical activity Advanced directives List of other physicians Hospitalizations, surgeries, and ER visits in previous 12 months Vitals Screenings to include cognitive, depression, and falls Referrals and appointments  No orders of the defined types were placed in this encounter.  In addition, I have reviewed and discussed with patient  certain preventive protocols, quality metrics, and best practice recommendations. A written personalized care plan for preventive services as well as general preventive health recommendations were provided to patient.   Ardella FORBES Dawn, LPN   [id number]   Return in 1 year (on 09/21/2025).  After Visit Summary: (MyChart) Due to this being a telephonic visit, the after visit summary with patients personalized plan was offered to patient via MyChart   Nurse Notes: Due for shingles and declines covid vaccine. Appointment made for 11/22/2024 for 6 month follow up.

## 2024-09-21 NOTE — Patient Instructions (Signed)
 [name],  Thank you for taking the time for your Medicare Wellness Visit. I appreciate your continued commitment to your health goals. Please review the care plan we discussed, and feel free to reach out if I can assist you further.  Please note that Annual Wellness Visits do not include a physical exam. Some assessments may be limited, especially if the visit was conducted virtually. If needed, we may recommend an in-person follow-up with your provider.  Ongoing Care Seeing your primary care provider every 3 to 6 months helps us  monitor your health and provide consistent, personalized care.   Referrals If a referral was made during today's visit and you haven't received any updates within two weeks, please contact the referred provider directly to check on the status.  Recommended Screenings:  Health Maintenance  Topic Date Due   COVID-19 Vaccine (1) Never done   Zoster (Shingles) Vaccine (1 of 2) 04/29/1965   Medicare Annual Wellness Visit  12/20/2022   Yearly kidney health urinalysis for diabetes  09/22/2024   Complete foot exam   09/22/2024   Hemoglobin A1C  11/15/2024   Yearly kidney function blood test for diabetes  05/15/2025   Eye exam for diabetics  06/12/2025   Pneumococcal Vaccine for age over 88  Completed   Flu Shot  Completed   Osteoporosis screening with Bone Density Scan  Completed   Hepatitis C Screening  Completed   Meningitis B Vaccine  Aged Out   DTaP/Tdap/Td vaccine  Discontinued   Hepatitis B Vaccine  Discontinued   Breast Cancer Screening  Discontinued   Colon Cancer Screening  Discontinued       09/20/2024    2:58 PM  Advanced Directives  Does Patient Have a Medical Advance Directive? Yes  Type of Estate Agent of [place];Living will  Copy of Healthcare Power of Attorney in Chart? No - copy requested    Vision: Annual vision screenings are recommended for early detection of glaucoma, cataracts, and diabetic retinopathy. These  exams can also reveal signs of chronic conditions such as diabetes and high blood pressure.  Dental: Annual dental screenings help detect early signs of oral cancer, gum disease, and other conditions linked to overall health, including heart disease and diabetes.  Please see the attached documents for additional preventive care recommendations.

## 2024-09-27 ENCOUNTER — Encounter: Payer: Self-pay | Admitting: Neurology

## 2024-09-27 ENCOUNTER — Ambulatory Visit: Admitting: Neurology

## 2024-09-27 VITALS — BP 186/73 | HR 86 | Ht 64.0 in | Wt 179.0 lb

## 2024-09-27 DIAGNOSIS — G3184 Mild cognitive impairment, so stated: Secondary | ICD-10-CM | POA: Diagnosis not present

## 2024-09-27 DIAGNOSIS — M7918 Myalgia, other site: Secondary | ICD-10-CM | POA: Insufficient documentation

## 2024-09-27 DIAGNOSIS — I6523 Occlusion and stenosis of bilateral carotid arteries: Secondary | ICD-10-CM

## 2024-09-27 NOTE — Progress Notes (Signed)
 [facility]  PATIENT: Robyn Hill DOB: [birth year]  REQUESTING CLINICIAN: Tysinger, Alm RAMAN, PA-C HISTORY FROM: Patient and daughter  REASON FOR VISIT: Memory loss    HISTORICAL  CHIEF COMPLAINT:  Chief Complaint  Patient presents with   RM 12    Consult: memory concerns; with daughter    HISTORY OF PRESENT ILLNESS:  Discussed the use of AI scribe software for clinical note transcription with the patient, who gave verbal consent to proceed.  Robyn Hill is a 78 year old female with hypertension, hyperlipidemia, heart disease, diabetes mellitus, depression atrial fibrillation who presents with memory loss.  She is accompanied by her daughter who provided most of the history.  With  Over the past six months, she has experienced progressive memory issues, with a noticeable decline in the last two months. She struggles with remembering conversations and appointment dates, experiences spatial disorientation, and has word-finding difficulties, particularly with familiar words. She also exhibits occasional repetitiveness in conversations. Despite these challenges, she maintains independence in daily activities such as cooking, self-care, and managing bills.  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 She takes duloxetine  for pain management, atorvastatin  for cholesterol, and Eliquis  as a blood thinner. She recently adjusted her medication schedule to reduce daytime sleepiness. She has no history of sleep apnea, seizures, traumatic brain injury, or stroke.  Her sleep pattern is inconsistent, often disrupted by pain, leading to nights with limited sleep. She sometimes sleeps well but frequently struggles to fall asleep until early morning.  Her family history includes relatives who died young  from stroke, heart attack, and cancer, with no known history of dementia.  She underwent an MRI in 2023, which showed atherosclerosis in the carotid arteries, mild atrophy in the temporal region, and a small chronic microhemorrhage in the superior left temporal lobe. She has regular ultrasounds to monitor this condition.    TBI:   No past history of TBI Stroke:   no past history of stroke Seizures:   no past history of seizures Sleep:   no history of sleep apnea. Mood: patient denies anxiety and depression Family history of Dementia:     Denies  Functional status: independent in all ADLs and IADLs Patient lives with alone. Cooking: yes sometimes Cleaning: sometimes  Shopping: with daughter Bathing: No issues Toileting: No issues Driving: Does not drive due to visual impairment Bills: No issues Medications: No issues Ever left the stove on by accident?: n/a Forget how to use items around the house?: Denies  Getting lost going to familiar places?: Denies  Forgetting loved ones names?: Denies  Word finding difficulty? Yes Sleep: good    OTHER MEDICAL CONDITIONS: Hypertension, hyperlipidemia, diabetes mellitus, heart disease, atrial fibrillation,   REVIEW OF SYSTEMS: Full 14 system review of systems performed and negative with exception of: As noted in the HPI  ALLERGIES: Allergies  Allergen Reactions   Tape Other (See Comments)    Pulls skin off, can use paper tape ONLY   Kiwi Extract Hives   Lyrica [Pregabalin] Other (See Comments)    Psychosis   Sunflowerseed Oil Hives   Oxycodone  Hcl Nausea Only   Hydrocodone -Acetaminophen  Nausea Only and Other (See Comments)    Extended-release form only   Invokana  [Canagliflozin ] Other (See Comments)    UTI   Neurontin [Gabapentin] Other (See Comments)  Intolerance/ineffective   Nitrofuran Derivatives Nausea Only and Other (See Comments)    stomach pain and weight loss    HOME MEDICATIONS: Outpatient Medications Prior  to Visit  Medication Sig Dispense Refill   apixaban  (ELIQUIS ) 5 MG TABS tablet TAKE 1 TABLET BY MOUTH TWICE DAILY 180 tablet 0   atorvastatin  (LIPITOR) 80 MG tablet TAKE 1 TABLET BY MOUTH AT BEDTIME 90 tablet 1   brimonidine  (ALPHAGAN ) 0.2 % ophthalmic solution Place 1 drop into both eyes 2 (two) times daily.     celecoxib  (CELEBREX ) 200 MG capsule Take 200 mg by mouth daily.     COMFORT EZ PEN NEEDLES 31G X 5 MM MISC USE AS DIRECTED TO INJECT TOUJEO  DAILY 100 each 1   Continuous Glucose Sensor (FREESTYLE LIBRE 3 PLUS SENSOR) MISC place one sensor EVERY 15 DAYS. 2 each 5   dexlansoprazole  (DEXILANT ) 60 MG capsule TAKE ONE CAPSULE BY MOUTH TWICE DAILY 180 capsule 2   diltiazem  (CARDIZEM ) 30 MG tablet Take 30 mg by mouth 2 (two) times daily.     dorzolamide -timolol  (COSOPT ) 22.3-6.8 MG/ML ophthalmic solution Place 1 drop into both eyes 2 (two) times daily.     Dulaglutide  (TRULICITY ) 3 MG/0.5ML SOAJ Inject 3 mg into the skin once a week. 6 mL 2   DULoxetine  (CYMBALTA ) 60 MG capsule TAKE 1 CAPSULE BY MOUTH TWICE DAILY 180 capsule 0   enalapril  (VASOTEC ) 20 MG tablet TAKE 1 TABLET BY MOUTH DAILY 30 tablet 3   EQ FIBER SUPPLEMENT PO Taking 3 chewables by mouth daily     ferrous gluconate  (FERGON) 324 MG tablet Take 1 tablet (324 mg total) by mouth 2 (two) times daily with a meal. (Patient taking differently: Take 324 mg by mouth 3 (three) times daily with meals.) 60 tablet 3   fluocinolone  0.01 % cream APPLY TO THE AFFECTED AREA(S) OF THE LEGS AND ARMS AT BEDTIME AS NEEDED. 60 g 0   hydrochlorothiazide  (HYDRODIURIL ) 25 MG tablet TAKE 1 TABLET BY MOUTH DAILY 90 tablet 2   insulin  glargine, 1 Unit Dial , (TOUJEO  SOLOSTAR) 300 UNIT/ML Solostar Pen Inject 20 Units into the skin daily. 15 mL 2   latanoprost  (XALATAN ) 0.005 % ophthalmic solution Place 1 drop into both eyes at bedtime.     LINZESS  72 MCG capsule TAKE ONE CAPSULE BY MOUTH EVERY DAY BEFORE breakfast 90 capsule 2   Magnesium Citrate (MAGNESIUM  GUMMIES PO) Take 500 mg by mouth daily.     metFORMIN  (GLUCOPHAGE ) 500 MG tablet TAKE 1 TABLET BY MOUTH TWICE DAILY WITH MEALS 180 tablet 0   morphine  (MSIR) 15 MG tablet Take 15 mg by mouth every 4 (four) hours.     Multiple Vitamins-Minerals (CENTRUM SILVER  50+WOMEN) TABS Take 2 capsules by mouth every morning.     Multiple Vitamins-Minerals (PRESERVISION AREDS 2) CAPS Take 2 capsules by mouth every morning.     nortriptyline  (PAMELOR ) 10 MG capsule Take 10 mg by mouth daily as needed for sleep. (Patient taking differently: Take 10 mg by mouth as needed for sleep.)     PROCTO-MED HC  2.5 % rectal cream place RECTALLY TWICE DAILY AS NEEDED FOR HEMORRHOIDS OR anal ITCHING. 30 g 1   triamcinolone  cream (KENALOG ) 0.1 % Apply 1 Application topically daily as needed (for itching- affected sites). 45 g 1   diltiazem  (CARDIZEM ) 60 MG tablet Take 1 tablet (60 mg total) by mouth 3 (three) times daily. 90 tablet 5   No facility-administered medications prior to visit.    PAST  MEDICAL HISTORY: Past Medical History:  Diagnosis Date   Adrenal mass, right    Allergy    Anemia    Asthma    as a teenager   Blind right eye    since childhood   Blood transfusion 1970   Chronic back pain    Chronic diarrhea    Colestipol  therapy   Depression    Diverticulitis    Diverticulosis    Fatty liver    Female bladder prolapse    GERD (gastroesophageal reflux disease)    Glaucoma    H/O bone density study 11/29/2014   normal study although mild decreased in density from prior study   H/O hiatal hernia    Hemorrhoids    History of kidney stones    History of uterine cancer    s/p hysterectomy   Hyperlipidemia    Hypertension    Migraine    hx of migraines in past    Neuromuscular disorder (HCC)    diabetic neuropahthy   Numbness and tingling of both legs    Osteoarthritis    Rheumatic fever    age 66   Type II diabetes mellitus (HCC)    Urinary incontinence    Vitamin D  deficiency     PAST  SURGICAL HISTORY: Past Surgical History:  Procedure Laterality Date   ABDOMINAL HYSTERECTOMY  10/19/1978   no further pap smears needed   ANTERIOR AND POSTERIOR REPAIR N/A 01/29/2015   Procedure: CYSTOCELE AND RECTOCELE ;  Surgeon: Glendia Elizabeth, MD;  Location: [facility];  Service: Urology;  Laterality: N/A;   BACK SURGERY     BREAST BIOPSY  08/19/1997   right   CARDIAC CATHETERIZATION  11/19/2006   CHOLECYSTECTOMY     COLONOSCOPY  10/19/2012   polyps, external hemorrhoids   COLONOSCOPY WITH PROPOFOL  N/A 11/12/2023   Procedure: COLONOSCOPY WITH PROPOFOL ;  Surgeon: Elicia Claw, MD;  Location: [facility];  Service: Gastroenterology;  Laterality: N/A;   CYSTOSCOPY N/A 01/29/2015   Procedure: CYSTOSCOPY;  Surgeon: Glendia Elizabeth, MD;  Location: [facility];  Service: Urology;  Laterality: N/A;   DILATION AND CURETTAGE OF UTERUS  10/19/1978   EDSI for back pain  last 01/2009   multiple x 5   ESOPHAGOGASTRODUODENOSCOPY (EGD) WITH PROPOFOL  N/A 11/11/2023   Procedure: ESOPHAGOGASTRODUODENOSCOPY (EGD) WITH PROPOFOL ;  Surgeon: Elicia Claw, MD;  Location: [facility];  Service: Gastroenterology;  Laterality: N/A;   EYE SURGERY     Bil Blind R eye   HEMOSTASIS CLIP PLACEMENT  11/12/2023   Procedure: HEMOSTASIS CLIP PLACEMENT;  Surgeon: Elicia Claw, MD;  Location: [facility];  Service: Gastroenterology;;   HOT HEMOSTASIS N/A 11/11/2023   Procedure: HOT HEMOSTASIS (ARGON PLASMA COAGULATION/BICAP);  Surgeon: Elicia Claw, MD;  Location: [facility];  Service: Gastroenterology;  Laterality: N/A;   KIDNEY STONE SURGERY  10/19/1977   LIVER BIOPSY  04/19/1999   minor laceration repair     right eye   POLYPECTOMY  11/12/2023   Procedure: POLYPECTOMY;  Surgeon: Elicia Claw, MD;  Location: [facility];  Service: Gastroenterology;;   POSTERIOR FUSION LUMBAR SPINE  04/05/2012   PULSE GENERATOR IMPLANT Right 03/12/2016   Procedure: Right Reposition of implantable pulse  generator;  Surgeon: Fairy Levels, MD;  Location: [facility];  Service: Neurosurgery;  Laterality: Right;  Right Reposition of implantable pulse generator   SPINAL CORD STIMULATOR INSERTION N/A 09/06/2015   Procedure: LUMBAR SPINAL CORD STIMULATOR INSERTION;  Surgeon: Deward Fabian, MD;  Location: [facility];  Service: Neurosurgery;  Laterality: N/A;  Spinal Cord Stimulator placement   SPINAL CORD STIMULATOR INSERTION N/A 10/22/2015   Procedure: Laminectomy for spinal cord stimulator paddle lead and implantable generator placement;  Surgeon: Fairy Levels, MD;  Location: [facility];  Service: Neurosurgery;  Laterality: N/A;  Laminectomy for spinal cord stimulator paddle lead and implantable generator placement   steroid injections of hip  last 02/2009   multiple x 4   TONSILLECTOMY AND ADENOIDECTOMY  10/19/1950   TRABECULECTOMY  10/19/1994   bilaterally; for glaucoma   UPPER GI ENDOSCOPY  05/21/2014   food, retained in the body of the stomach   VAGINAL PROLAPSE REPAIR N/A 01/29/2015   Procedure:  VAULT PROLAPSE WITH GRAFT ;  Surgeon: Glendia Elizabeth, MD;  Location: [facility];  Service: Urology;  Laterality: N/A;    FAMILY HISTORY: Family History  Problem Relation Age of Onset   Heart attack Brother 45   Hypertension Other    Diabetes Other    Cancer Mother 46   Aplastic anemia Mother 58   Heart attack Brother 46   Colon cancer Brother    Heart attack Sister    Heart attack Brother 26   Anesthesia problems Neg Hx     SOCIAL HISTORY: Social History   Socioeconomic History   Marital status: Widowed    Spouse name: Darryle    Number of children: 3   Years of education: Not on file   Highest education level: Some college, no degree  Occupational History   Occupation: Retired   Tobacco Use   Smoking status: Never   Smokeless tobacco: Never  Vaping Use   Vaping status: Never Used  Substance and Sexual Activity   Alcohol use: Not Currently    Comment: rare   Drug use: No    Sexual activity: Not Currently    Comment: married  Other Topics Concern   Not on file  Social History Narrative   Three children and two step.     Right handed   Caffeine rarely    Lives at home with husband    Social Drivers of Corporate Investment Banker Strain: Low Risk  (09/20/2024)   Overall Financial Resource Strain (CARDIA)    Difficulty of Paying Living Expenses: Not hard at all  Food Insecurity: No Food Insecurity (09/20/2024)   Hunger Vital Sign    Worried About Running Out of Food in the Last Year: Never true    Ran Out of Food in the Last Year: Never true  Transportation Needs: No Transportation Needs (09/20/2024)   PRAPARE - Administrator, Civil Service (Medical): No    Lack of Transportation (Non-Medical): No  Physical Activity: Inactive (09/20/2024)   Exercise Vital Sign    Days of Exercise per Week: 0 days    Minutes of Exercise per Session: 0 min  Stress: No Stress Concern Present (09/20/2024)   [employer] of Occupational Health - Occupational Stress Questionnaire    Feeling of Stress: Not at all  Social Connections: Unknown (09/20/2024)   Social Connection and Isolation Panel    Frequency of Communication with Friends and Family: Twice a week    Frequency of Social Gatherings with Friends and Family: Patient declined    Attends Religious Services: More than 4 times per year    Active Member of [employer] or Organizations: Yes    Attends Banker Meetings: More than 4 times per year    Marital Status: Widowed  Intimate Partner Violence: Not At  Risk (09/21/2024)   Humiliation, Afraid, Rape, and Kick questionnaire    Fear of Current or Ex-Partner: No    Emotionally Abused: No    Physically Abused: No    Sexually Abused: No    PHYSICAL EXAM  GENERAL EXAM/CONSTITUTIONAL: Vitals:  Vitals:   09/27/24 1254  BP: (!) 186/73  Pulse: 86  Weight: 179 lb (81.2 kg)  Height: 5' 4 (1.626 m)   Body mass index is 30.73 kg/m. Wt  Readings from Last 3 Encounters:  09/27/24 179 lb (81.2 kg)  07/03/24 174 lb 6.4 oz (79.1 kg)  06/12/24 178 lb 12.8 oz (81.1 kg)   Patient is in no distress; well developed, nourished and groomed; neck is supple  MUSCULOSKELETAL: Gait, strength, tone, movements noted in Neurologic exam below  NEUROLOGIC: MENTAL STATUS:     12/15/2022    9:44 AM  MMSE - Mini Mental State Exam  Orientation to time 5  Orientation to Place 5  Registration 3  Attention/ Calculation 5  Recall 3  Language- name 2 objects 2  Language- repeat 1  Language- follow 3 step command 3  Language- read & follow direction 1  Write a sentence 1  Copy design 1  Total score 30      09/27/2024    1:13 PM  Montreal Cognitive Assessment   Visuospatial/ Executive (0/5) 5  Naming (0/3) 3  Attention: Read list of digits (0/2) 2  Attention: Read list of letters (0/1) 1  Attention: Serial 7 subtraction starting at 100 (0/3) 3  Language: Repeat phrase (0/2) 1  Language : Fluency (0/1) 1  Abstraction (0/2) 1  Delayed Recall (0/5) 3  Orientation (0/6) 6  Total 26   awake, alert, oriented to person, place and time language fluent, comprehension intact, naming intact fund of knowledge appropriate  CRANIAL NERVE:  2nd, 3rd, 4th, 6th-Right visual field cut.  Patient is blind in right eye. Extraocular muscles intact, no nystagmus 5th - facial sensation symmetric 7th - facial strength symmetric 8th - hearing intact 9th - palate elevates symmetrically, uvula midline 11th - shoulder shrug symmetric 12th - tongue protrusion midline  MOTOR:  normal bulk and tone, full strength in the BUE, BLE  SENSORY:  normal and symmetric to light touch  COORDINATION:  finger-nose-finger, fine finger movements normal  GAIT/STATION:  Needs assistance due to visual impairment   DIAGNOSTIC DATA (LABS, IMAGING, TESTING) - I reviewed patient records, labs, notes, testing and imaging myself where available.  Lab Results   Component Value Date   WBC 8.0 07/12/2024   HGB 11.9 07/12/2024   HCT 37.9 07/12/2024   MCV 88 07/12/2024   PLT 280 07/12/2024      Component Value Date/Time   NA 137 05/15/2024 0932   NA 139 09/23/2023 1211   K 3.8 05/15/2024 0932   CL 99 05/15/2024 0932   CO2 29 05/15/2024 0932   GLUCOSE 120 (H) 05/15/2024 0932   BUN 22 05/15/2024 0932   BUN 20 09/23/2023 1211   CREATININE 0.90 08/02/2024 0911   CREATININE 0.89 03/30/2017 1008   CALCIUM  9.4 05/15/2024 0932   PROT 7.1 05/15/2024 0932   PROT 7.2 09/23/2023 1211   ALBUMIN  3.7 05/15/2024 0932   ALBUMIN  4.3 09/23/2023 1211   AST 30 05/15/2024 0932   ALT 35 05/15/2024 0932   ALKPHOS 113 05/15/2024 0932   BILITOT 0.3 05/15/2024 0932   BILITOT 0.5 09/23/2023 1211   GFRNONAA >60 05/15/2024 0932   GFRAA 79 06/05/2020 1351  Lab Results  Component Value Date   CHOL 126 12/02/2023   HDL 58 12/02/2023   LDLCALC 51 12/02/2023   TRIG 83 12/02/2023   CHOLHDL 2.2 12/02/2023   Lab Results  Component Value Date   HGBA1C 7.0 (H) 05/15/2024   Lab Results  Component Value Date   VITAMINB12 359 05/15/2024   Lab Results  Component Value Date   TSH 1.620 09/23/2023    MRI Brain 03/13/2022 No recent infarction, hemorrhage, or mass. Minor chronic microvascular ischemic changes. No proximal intracranial vessel occlusion significant stenosis.    ASSESSMENT AND PLAN  78 y.o. year old female with    Mild cognitive impairment Progressive memory loss over the last six months, with increased difficulty in the last two months. No significant impact on activities of daily living. Normal MoCA score of 26. Differential diagnosis includes Normal cognition vs. Mild cognitive impairment. No evidence of dementia as she remains independent in daily activities. MRI shows age-related atrophy and nonspecific white matter changes. Discussed potential for biomarker testing for Alzheimer's disease, and informed patient that if they happen to be  positive, as of today she does not have the diagnosis of dementia.  Emphasized lifestyle modifications such as exercise, sleep, and diet for cognitive health. - Recommended B12 supplement, 1000 mcg daily. - Encouraged 20 minutes of exercise, five days a week. - Advised on maintaining good sleep habits. - Discussed potential for biomarker testing for Alzheimer's disease  Right carotid artery stenosis 60-79% stenosis on last ultrasound. Asymptomatic with no need for surgical intervention. Managed with medical therapy. Current management with atorvastatin  and Eliquis  is appropriate. - Continue atorvastatin  for cholesterol management. - Continue Eliquis  for anticoagulation.   1. Mild cognitive impairment   2. Bilateral carotid artery stenosis     Patient Instructions  Will check dementia lab including TSH and Alzheimer's biomarker, I will contact you to go over the results  Continue your other medications Please call if symptoms do get worse Discussed ways of reducing risk of getting up Alzheimer's disease including maintaining good self sleep, good health, good diet plan and exercise.  There are well-accepted and sensible ways to reduce risk for Alzheimers disease and other degenerative brain disorders .  Exercise Daily Walk A daily 20 minute walk should be part of your routine. Disease related apathy can be a significant roadblock to exercise and the only way to overcome this is to make it a daily routine and perhaps have a reward at the end (something your loved one loves to eat or drink perhaps) or a personal trainer coming to the home can also be very useful. Most importantly, the patient is much more likely to exercise if the caregiver / spouse does it with him/her. In general a structured, repetitive schedule is best.  General Health: Any diseases which effect your body will effect your brain such as a pneumonia, urinary infection, blood clot, heart attack or stroke. Keep contact with  your primary care doctor for regular follow ups.  Sleep. A good nights sleep is healthy for the brain. Seven hours is recommended. If you have insomnia or poor sleep habits we can give you some instructions. If you have sleep apnea wear your mask.  Diet: Eating a heart healthy diet is also a good idea; fish and poultry instead of red meat, nuts (mostly non-peanuts), vegetables, fruits, olive oil or canola oil (instead of butter), minimal salt (use other spices to flavor foods), whole grain rice, bread, cereal and pasta and wine in  moderation.Research is now showing that the MIND diet, which is a combination of The Mediterranean diet and the DASH diet, is beneficial for cognitive processing and longevity. Information about this diet can be found in The MIND Diet, a book by Annitta Feeling, MS, RDN, and online at wildwildscience.es  Finances, Power of [address] and Advance Directives: You should consider putting legal safeguards in place with regard to financial and medical decision making. While the spouse always has power of attorney for medical and financial issues in the absence of any form, you should consider what you want in case the spouse / caregiver is no longer around or capable of making decisions.   Orders Placed This Encounter  Procedures   TSH   LUMIPULSE PTAU-217/BETA AMYLOID 42 RATIO    No orders of the defined types were placed in this encounter.   Return if symptoms worsen or fail to improve.  I personally spent a total of 65 minutes in the care of the patient today including preparing to see the patient, getting/reviewing separately obtained history, performing a medically appropriate exam/evaluation, counseling and educating, documenting clinical information in the EHR, independently interpreting results, communicating results, and discussing the diagnosis of Dementia vs Mild cognitive Impairment.   Pastor Falling, MD 09/27/2024, 4:36 PM  [facility] [address] [place], [place] [address] [phone]

## 2024-09-27 NOTE — Patient Instructions (Addendum)
 Will check dementia lab including TSH and Alzheimer's biomarker, I will contact you to go over the results  Continue your other medications Please call if symptoms do get worse Discussed ways of reducing risk of getting up Alzheimer's disease including maintaining good self sleep, good health, good diet plan and exercise.  There are well-accepted and sensible ways to reduce risk for Alzheimers disease and other degenerative brain disorders .  Exercise Daily Walk A daily 20 minute walk should be part of your routine. Disease related apathy can be a significant roadblock to exercise and the only way to overcome this is to make it a daily routine and perhaps have a reward at the end (something your loved one loves to eat or drink perhaps) or a personal trainer coming to the home can also be very useful. Most importantly, the patient is much more likely to exercise if the caregiver / spouse does it with him/her. In general a structured, repetitive schedule is best.  General Health: Any diseases which effect your body will effect your brain such as a pneumonia, urinary infection, blood clot, heart attack or stroke. Keep contact with your primary care doctor for regular follow ups.  Sleep. A good nights sleep is healthy for the brain. Seven hours is recommended. If you have insomnia or poor sleep habits we can give you some instructions. If you have sleep apnea wear your mask.  Diet: Eating a heart healthy diet is also a good idea; fish and poultry instead of red meat, nuts (mostly non-peanuts), vegetables, fruits, olive oil or canola oil (instead of butter), minimal salt (use other spices to flavor foods), whole grain rice, bread, cereal and pasta and wine in moderation.Research is now showing that the MIND diet, which is a combination of The Mediterranean diet and the DASH diet, is beneficial for cognitive processing and longevity. Information about this diet can be found in The MIND Diet, a book by Annitta Feeling, MS, RDN, and online at wildwildscience.es  Finances, Power of [address] and Advance Directives: You should consider putting legal safeguards in place with regard to financial and medical decision making. While the spouse always has power of attorney for medical and financial issues in the absence of any form, you should consider what you want in case the spouse / caregiver is no longer around or capable of making decisions.

## 2024-09-28 ENCOUNTER — Other Ambulatory Visit

## 2024-09-28 LAB — LUMIPULSE® PTAU-217/BETA AMYLOID 42 RATIO, PLASMA

## 2024-10-01 LAB — GENECONNECT MOLECULAR SCREEN: Genetic Analysis Overall Interpretation: NEGATIVE

## 2024-10-04 ENCOUNTER — Inpatient Hospital Stay: Admission: RE | Admit: 2024-10-04 | Discharge: 2024-10-04 | Attending: Neurosurgery | Admitting: Neurosurgery

## 2024-10-04 ENCOUNTER — Other Ambulatory Visit

## 2024-10-04 DIAGNOSIS — M47812 Spondylosis without myelopathy or radiculopathy, cervical region: Secondary | ICD-10-CM

## 2024-10-04 DIAGNOSIS — M961 Postlaminectomy syndrome, not elsewhere classified: Secondary | ICD-10-CM

## 2024-10-04 LAB — LUMIPULSE® PTAU-217/BETA AMYLOID 42 RATIO, PLASMA

## 2024-10-04 LAB — TSH: TSH: 1.74 u[IU]/mL (ref 0.450–4.500)

## 2024-10-09 ENCOUNTER — Ambulatory Visit: Payer: Self-pay | Admitting: Neurology

## 2024-10-09 DIAGNOSIS — G3184 Mild cognitive impairment, so stated: Secondary | ICD-10-CM

## 2024-10-09 NOTE — Telephone Encounter (Signed)
 Order signed and faxed to number below, confirmation received.

## 2024-10-09 NOTE — Telephone Encounter (Signed)
 Spoke with patient daughter Madelin Amble  has DPR access. And informed that order has been placed. Pt lives in [place] and they would prefer to have labs drawn closer to their location. The closer location was [place].   Order released, I asked daughter to call if [facility] say they can't see the order. Pt will report to [facility] on 10/16/24  Order also printed and faxed to [phone]   (pending to be signed by [facility]

## 2024-10-09 NOTE — Telephone Encounter (Signed)
 Pt daughter called returning call ., Informed that CMA will call back .

## 2024-10-09 NOTE — Progress Notes (Signed)
 Please call and advise the patient that the recent dementia labs was were not completed by the [facility]. Unfortunately we will have to repeat it. Please have patient stop by the office anytime to have labs drawn. No appointment needed.  Please remind patient to keep any upcoming appointments or tests and to call us  with any interim questions, concerns, problems or updates. Thanks,   Pastor Falling, MD

## 2024-10-17 ENCOUNTER — Other Ambulatory Visit: Payer: Self-pay | Admitting: Medical

## 2024-10-20 ENCOUNTER — Other Ambulatory Visit: Payer: Self-pay | Admitting: Medical

## 2024-10-23 ENCOUNTER — Other Ambulatory Visit: Payer: Self-pay | Admitting: Medical

## 2024-10-27 ENCOUNTER — Other Ambulatory Visit: Payer: Self-pay | Admitting: Medical

## 2024-11-21 ENCOUNTER — Telehealth: Payer: Self-pay

## 2024-11-22 ENCOUNTER — Ambulatory Visit: Admitting: Medical

## 2024-11-22 VITALS — BP 124/68 | HR 79 | Wt 168.2 lb

## 2024-11-22 DIAGNOSIS — R5383 Other fatigue: Secondary | ICD-10-CM

## 2024-11-22 DIAGNOSIS — Z789 Other specified health status: Secondary | ICD-10-CM

## 2024-11-22 DIAGNOSIS — Z23 Encounter for immunization: Secondary | ICD-10-CM

## 2024-11-22 DIAGNOSIS — E118 Type 2 diabetes mellitus with unspecified complications: Secondary | ICD-10-CM | POA: Diagnosis not present

## 2024-11-22 DIAGNOSIS — I700 Atherosclerosis of aorta: Secondary | ICD-10-CM | POA: Diagnosis not present

## 2024-11-22 DIAGNOSIS — I480 Paroxysmal atrial fibrillation: Secondary | ICD-10-CM | POA: Diagnosis not present

## 2024-11-22 DIAGNOSIS — R195 Other fecal abnormalities: Secondary | ICD-10-CM

## 2024-11-22 DIAGNOSIS — K219 Gastro-esophageal reflux disease without esophagitis: Secondary | ICD-10-CM | POA: Diagnosis not present

## 2024-11-22 DIAGNOSIS — J329 Chronic sinusitis, unspecified: Secondary | ICD-10-CM | POA: Diagnosis not present

## 2024-11-22 DIAGNOSIS — E538 Deficiency of other specified B group vitamins: Secondary | ICD-10-CM | POA: Diagnosis not present

## 2024-11-22 DIAGNOSIS — F411 Generalized anxiety disorder: Secondary | ICD-10-CM | POA: Diagnosis not present

## 2024-11-22 DIAGNOSIS — G894 Chronic pain syndrome: Secondary | ICD-10-CM | POA: Diagnosis not present

## 2024-11-22 MED ORDER — PAROXETINE HCL 20 MG PO TABS: 20.0000 mg | ORAL_TABLET | Freq: Every day | ORAL | 1 refills | Status: AC

## 2024-11-22 MED ORDER — AMOXICILLIN-POT CLAVULANATE 875-125 MG PO TABS: 1.0000 | ORAL_TABLET | Freq: Two times a day (BID) | ORAL | 0 refills | Status: AC

## 2024-11-22 NOTE — Progress Notes (Signed)
 "   Name: Robyn Hill   Date of Visit: 11/22/24   CHIEF COMPLAINT:  Chief Complaint  Patient presents with   Medical Management of Chronic Issues    6 month follow-up, lots of headaches- pressure in head, pain behind eye- saw eye doctor and everything was good, coughing a lot when laying down, lots of mucous coming up, having trouble breathing with laying down- sees cardiology- Afib flared up while she was on b12 so stopped b12. The last 3 days- has black stool. Took celebrex  in January for a couple weeks and started having rectal bleeding but once stopped the celebrex   and bleeding stopped. . Needs colonoscopy again         HPI:  Discussed the use of AI scribe software for clinical note transcription with the patient, who gave verbal consent to proceed.  History of Present Illness    Patient Care Team: Robyn Hill, Robyn RAMAN, PA-C as PCP - General (Family Medicine) Mallipeddi, Diannah SQUIBB, MD as PCP - Cardiology (Cardiology) Robyn Chew, MD as PCP - Electrophysiology (Cardiology) Robyn Rogue, MD as Consulting Physician MacDiarmid, Glendia, MD as Consulting Physician (Urology) Robyn Agent, MD as Consulting Physician (Cardiology) Robyn Deatrice RAMAN, MD as Consulting Physician (Pain Medicine) Robyn Pac, MD as Consulting Physician (Neurosurgery) Robyn Claw, MD as Consulting Physician (Gastroenterology) Robyn Lek, MD as Consulting Physician (Neurology)   Robyn Hill is a 79 year old female accompanied by her daughter 76.  She experiences nocturnal dyspnea, particularly when supine, due to congestion. She reports pressure behind her right eye and was recently evaluated by eye doctor, no eye concern, so she has concern for sinus infection.. No nasal stuffiness, but she has nocturnal cough and mucus production. Current medications include Zyrtec , Flonase, and an allergy pill, with Flonase used nightly.  She has had two episodes of melena over the past four days,  with the first being large and black, followed by another similar one the next day. Her last gastrointestinal consultation was about a year ago, and there has been no recent blood work.  There was concern about repeating colonoscopy this year.  She reports low energy levels most days, with occasional bursts of energy, such as when she shoveled snow recently. Her blood sugar levels fluctuate, spiking postprandially and dropping significantly by early morning. She administers 25 to 30 units of insulin  at night if her levels are high, but this sometimes results in hypoglycemia by morning. Dietary habits are inconsistent, often skipping meals or eating inadequately.  Otherwise she averages 20 units of Toujeo  at night.  She continues on Trulicity  weekly injection as well  She has a history of atrial fibrillation and was prescribed 10,000 mg of B12 by a neurologist, which she discontinued after noticing an increase in atrial fibrillation episodes. She is scheduled to see a cardiologist soon. Current medications include nortriptyline  as needed, a multivitamin, morphine , magnesium, Benadryl , Lipitor, Eliquis , diltiazem , Dexilant , eye drops, Trulicity , Cymbalta ,, iron , enalapril , hydrochlorothiazide , Toujeo , and metformin .  She expresses feelings of being lost and questions her purpose. She lives alone but sometimes has family stay with her. She is not involved in regular social activities and doesn't have transportation to  local resources for seniors.  She sees cardiology tomorrow.  She is not all that happy with the care she is receiving from neurosurgery currently regarding her chronic back pain and neck pain.  She never heard back about the scan she had recently  No other aggravating or relieving factors. No other complaint.  Past Medical History:  Diagnosis Date   Adrenal mass, right    Allergy    Anemia    Asthma    as a teenager   Blind right eye    since childhood   Blood transfusion 1970    Chronic back pain    Chronic diarrhea    Colestipol  therapy   Depression    Diverticulitis    Diverticulosis    Fatty liver    Female bladder prolapse    GERD (gastroesophageal reflux disease)    Glaucoma    H/O bone density study 11/29/2014   normal study although mild decreased in density from prior study   H/O hiatal hernia    Hemorrhoids    History of kidney stones    History of uterine cancer    s/p hysterectomy   Hyperlipidemia    Hypertension    Migraine    hx of migraines in past    Neuromuscular disorder (HCC)    diabetic neuropahthy   Numbness and tingling of both legs    Osteoarthritis    Rheumatic fever    age 30   Type II diabetes mellitus (HCC)    Urinary incontinence    Vitamin D  deficiency    Current Outpatient Medications on File Prior to Visit  Medication Sig Dispense Refill   apixaban  (ELIQUIS ) 5 MG TABS tablet TAKE 1 TABLET BY MOUTH TWICE DAILY 180 tablet 0   atorvastatin  (LIPITOR) 80 MG tablet TAKE 1 TABLET BY MOUTH AT BEDTIME 90 tablet 1   brimonidine  (ALPHAGAN ) 0.2 % ophthalmic solution Place 1 drop into both eyes 2 (two) times daily.     Continuous Glucose Sensor (FREESTYLE LIBRE 3 PLUS SENSOR) MISC place one sensor EVERY 15 DAYS. 2 each 5   dexlansoprazole  (DEXILANT ) 60 MG capsule TAKE ONE CAPSULE BY MOUTH TWICE DAILY 180 capsule 2   diltiazem  (CARDIZEM ) 30 MG tablet Take 30 mg by mouth 2 (two) times daily.     dorzolamide -timolol  (COSOPT ) 22.3-6.8 MG/ML ophthalmic solution Place 1 drop into both eyes 2 (two) times daily.     Dulaglutide  (TRULICITY ) 3 MG/0.5ML SOAJ Inject 3 mg into the skin once a week. 6 mL 2   DULoxetine  (CYMBALTA ) 60 MG capsule TAKE 1 CAPSULE BY MOUTH TWICE DAILY 60 capsule 0   enalapril  (VASOTEC ) 20 MG tablet TAKE 1 TABLET BY MOUTH DAILY 30 tablet 3   ferrous gluconate  (FERGON) 324 MG tablet Take 1 tablet (324 mg total) by mouth 2 (two) times daily with a meal. 60 tablet 3   fluocinolone  0.01 % cream APPLY TO THE AFFECTED AREA(S)  OF THE LEGS AND ARMS AT BEDTIME AS NEEDED. 60 g 0   hydrochlorothiazide  (HYDRODIURIL ) 25 MG tablet TAKE 1 TABLET BY MOUTH DAILY 90 tablet 2   insulin  glargine, 1 Unit Dial , (TOUJEO  SOLOSTAR) 300 UNIT/ML Solostar Pen Inject 20 Units into the skin daily. 15 mL 2   latanoprost  (XALATAN ) 0.005 % ophthalmic solution Place 1 drop into both eyes at bedtime.     LINZESS  72 MCG capsule TAKE ONE CAPSULE BY MOUTH EVERY DAY BEFORE breakfast 90 capsule 2   Magnesium Citrate (MAGNESIUM GUMMIES PO) Take 500 mg by mouth daily.     metFORMIN  (GLUCOPHAGE ) 500 MG tablet TAKE 1 TABLET BY MOUTH TWICE DAILY WITH MEALS 180 tablet 0   morphine  (MSIR) 15 MG tablet Take 15 mg by mouth every 4 (four) hours.     Multiple Vitamins-Minerals (CENTRUM SILVER  50+WOMEN) TABS Take 2 capsules by mouth every  morning.     Multiple Vitamins-Minerals (PRESERVISION AREDS 2) CAPS Take 2 capsules by mouth every morning.     nortriptyline  (PAMELOR ) 10 MG capsule Take 10 mg by mouth daily as needed for sleep.     triamcinolone  cream (KENALOG ) 0.1 % Apply 1 Application topically daily as needed (for itching- affected sites). 45 g 1   COMFORT EZ PEN NEEDLES 31G X 5 MM MISC USE AS DIRECTED TO INJECT TOUJEO  DAILY 100 each 1   No current facility-administered medications on file prior to visit.     OBJECTIVE:    BP 124/68   Pulse 79   Wt 168 lb 3.2 oz (76.3 kg)   BMI 28.87 kg/m    BP Readings from Last 3 Encounters:  11/22/24 124/68  09/27/24 (!) 186/73  08/02/24 (!) 149/67   Wt Readings from Last 3 Encounters:  11/22/24 168 lb 3.2 oz (76.3 kg)  09/27/24 179 lb (81.2 kg)  07/03/24 174 lb 6.4 oz (79.1 kg)   General appearence: alert, no distress, WD/WN,  HEENT: normocephalic, sclerae anicteric, PERRLA, EOMi, nares patent, no discharge or erythema Neck: supple, no lymphadenopathy, no thyromegaly, no masses Heart: RRR, normal S1, S2, no murmurs Lungs: CTA bilaterally, no wheezes, rhonchi, or rales Extremities: no edema, no  cyanosis, no clubbing Pulses: 2+ symmetric, upper and lower extremities, normal cap refill     ASSESSMENT/PLAN:   Encounter Diagnoses  Name Primary?   Diabetes mellitus with complication (HCC) Yes   Aortic atherosclerosis    Decreased activities of daily living (ADL)    Chronic pain syndrome    GAD (generalized anxiety disorder)    Gastroesophageal reflux disease without esophagitis    Paroxysmal A-fib (HCC)    Fatigue, unspecified type    Dark stools    B12 deficiency    Pneumococcal vaccine administered    Sinusitis, unspecified chronicity, unspecified location     Type 2 diabetes mellitus with complications Blood glucose fluctuations with postprandial spikes and nocturnal hypoglycemia. Insulin  dosage adjustment needed. - Ordered A1c and other relevant blood tests. - Advised to reduce insulin  dosage to prevent nocturnal hypoglycemia. -Current regimen is metformin  500 mg twice daily, Toujeo  20 units at baseline but sometimes uses up to 30 units daily, Trulicity  3 mg weekly injection  Mood changes, decreased ADLs -Family will look into public transportation for disabled individuals to see if we can get her some transportation support -Referral to [facility] care team/home health  Acute sinusitis Symptoms suggest sinus infection or congestion. Inconsistent Flonase use noted. - Recommended nasal saline flush using a kit from [employer]. - Prescribed Augmentin  for sinusitis. - Consider plain Mucinex over-the-counter twice daily for the next 5 days  Melena and evaluation for gastrointestinal bleeding Episodes of black stools suggest possible GI bleeding or dietary cause. No recent GI follow-up. - Ordered blood tests including hemoglobin and iron  levels. - Advised to discuss potential need for colonoscopy with cardiologist.  Paroxysmal atrial fibrillation Chronic AFib with frequent episodes. Cardiologist follow-up scheduled for tomorrow - Continue current medications including  Eliquis  and diltiazem . - Follow up with cardiologist as scheduled.  Chronic pain syndrome Chronic pain managed with Cymbalta  and morphine . Dissatisfaction with current management noted. - Will consider referral to Dr. Lorilee for pain management if current management is unsatisfactory. -Discontinue Cymbalta  as she feels like it does not really help at all.  This is not the first time she has mentioned this  Major depressive disorder Current treatment with Cymbalta  ineffective for mood. Discussed switching to Paxil . -  Switched from Cymbalta  to Paxil  20 mg daily. - Monitor mood and adjust treatment as needed.  B12 deficiency Previously adequate B12 levels. Supplements stopped due to AFib symptoms. - Ordered B12 level as part of blood tests.  General health maintenance Due for pneumonia, TDAP, and shingles vaccines. Importance of vaccinations discussed. - Administered Prevnar 20 vaccine today. - Advised to obtain TDAP and shingles vaccines at the pharmacy.  Hyperlipidemia and aortic atherosclerosis -Continue atorvastatin  Lipitor 80 mg daily  Violetta was seen today for medical management of chronic issues.  Diagnoses and all orders for this visit:  Diabetes mellitus with complication (HCC) -     Hemoglobin A1c -     Microalbumin/Creatinine Ratio, Urine  Aortic atherosclerosis -     Lipid panel  Decreased activities of daily living (ADL)  Chronic pain syndrome  GAD (generalized anxiety disorder)  Gastroesophageal reflux disease without esophagitis  Paroxysmal A-fib (HCC)  Fatigue, unspecified type -     CBC with Differential/Platelet -     Comprehensive metabolic panel with GFR -     TSH  Dark stools -     CBC with Differential/Platelet -     Iron , TIBC and Ferritin Panel  B12 deficiency -     Vitamin B12  Pneumococcal vaccine administered -     Pneumococcal conjugate vaccine 20-valent (Prevnar 20)  Sinusitis, unspecified chronicity, unspecified location  Other  orders -     amoxicillin -clavulanate (AUGMENTIN ) 875-125 MG tablet; Take 1 tablet by mouth 2 (two) times daily. -     PARoxetine  (PAXIL ) 20 MG tablet; Take 1 tablet (20 mg total) by mouth daily.   Follow-up pending labs  Spent > 45 minutes face to face with patient in discussion of symptoms, evaluation, plan and recommendations.    [facility] "

## 2024-11-22 NOTE — Addendum Note (Signed)
 Addended by: VICCI HUSBAND A on: 11/22/2024 04:15 PM   Modules accepted: Orders

## 2024-11-22 NOTE — Progress Notes (Signed)
 Placed referral for home health for [facility]  Also tried to call [facility] but couldn't get anyone on the phone. Did see a FAQ page on the website and to get access to a ride patient would have to contact:  Contact your local DSS [phone]) or Senior [facility] [phone]) to see if you  are eligible for transportation through one of these programs. If you are not eligible to  ride [employer] under one of these programs call [employer] directly at [phone] Ext [phone] or  TDD/TTY [phone] and we can schedule your ride. All calls for transportation  must be made by 5:00 PM three days before the request to schedule transportation in  order to ensure services

## 2024-11-23 ENCOUNTER — Ambulatory Visit (HOSPITAL_COMMUNITY): Admitting: Physician Assistant

## 2024-11-23 ENCOUNTER — Encounter: Payer: Self-pay | Admitting: Cardiology

## 2024-11-23 LAB — COMPREHENSIVE METABOLIC PANEL WITH GFR
ALT: 21 [IU]/L (ref 0–32)
AST: 26 [IU]/L (ref 0–40)
Albumin: 4.9 g/dL — ABNORMAL HIGH (ref 3.8–4.8)
Alkaline Phosphatase: 133 [IU]/L (ref 49–135)
BUN/Creatinine Ratio: 22 (ref 12–28)
BUN: 21 mg/dL (ref 8–27)
Bilirubin Total: 0.4 mg/dL (ref 0.0–1.2)
CO2: 19 mmol/L — ABNORMAL LOW (ref 20–29)
Calcium: 10.3 mg/dL (ref 8.7–10.3)
Chloride: 101 mmol/L (ref 96–106)
Creatinine, Ser: 0.94 mg/dL (ref 0.57–1.00)
Globulin, Total: 2.9 g/dL (ref 1.5–4.5)
Glucose: 118 mg/dL — ABNORMAL HIGH (ref 70–99)
Potassium: 4 mmol/L (ref 3.5–5.2)
Sodium: 140 mmol/L (ref 134–144)
Total Protein: 7.8 g/dL (ref 6.0–8.5)
eGFR: 62 mL/min/{1.73_m2}

## 2024-11-23 LAB — MICROALBUMIN / CREATININE URINE RATIO
Creatinine, Urine: 189.9 mg/dL
Microalb/Creat Ratio: 10 mg/g{creat} (ref 0–29)
Microalbumin, Urine: 19.5 ug/mL

## 2024-11-23 LAB — IRON,TIBC AND FERRITIN PANEL
Ferritin: 20 ng/mL (ref 15–150)
Iron Saturation: 21 % (ref 15–55)
Iron: 85 ug/dL (ref 27–139)
Total Iron Binding Capacity: 414 ug/dL (ref 250–450)
UIBC: 329 ug/dL (ref 118–369)

## 2024-11-23 LAB — CBC WITH DIFFERENTIAL/PLATELET
Basophils Absolute: 0.1 10*3/uL (ref 0.0–0.2)
Basos: 1 %
EOS (ABSOLUTE): 0.3 10*3/uL (ref 0.0–0.4)
Eos: 3 %
Hematocrit: 42.2 % (ref 34.0–46.6)
Hemoglobin: 13.5 g/dL (ref 11.1–15.9)
Immature Grans (Abs): 0 10*3/uL (ref 0.0–0.1)
Immature Granulocytes: 0 %
Lymphocytes Absolute: 2.8 10*3/uL (ref 0.7–3.1)
Lymphs: 27 %
MCH: 27.2 pg (ref 26.6–33.0)
MCHC: 32 g/dL (ref 31.5–35.7)
MCV: 85 fL (ref 79–97)
Monocytes Absolute: 0.9 10*3/uL (ref 0.1–0.9)
Monocytes: 9 %
Neutrophils Absolute: 6.3 10*3/uL (ref 1.4–7.0)
Neutrophils: 60 %
Platelets: 406 10*3/uL (ref 150–450)
RBC: 4.96 x10E6/uL (ref 3.77–5.28)
RDW: 11.7 % (ref 11.7–15.4)
WBC: 10.4 10*3/uL (ref 3.4–10.8)

## 2024-11-23 LAB — HEMOGLOBIN A1C
Est. average glucose Bld gHb Est-mCnc: 143 mg/dL
Hgb A1c MFr Bld: 6.6 % — ABNORMAL HIGH (ref 4.8–5.6)

## 2024-11-23 LAB — VITAMIN B12: Vitamin B-12: >2000 pg/mL — ABNORMAL HIGH (ref 232–1245)

## 2024-11-23 LAB — LIPID PANEL
Chol/HDL Ratio: 1.9 ratio (ref 0.0–4.4)
Cholesterol, Total: 122 mg/dL (ref 100–199)
HDL: 63 mg/dL
LDL Chol Calc (NIH): 42 mg/dL (ref 0–99)
Triglycerides: 90 mg/dL (ref 0–149)
VLDL Cholesterol Cal: 17 mg/dL (ref 5–40)

## 2024-11-23 LAB — TSH: TSH: 1.43 u[IU]/mL (ref 0.450–4.500)

## 2024-11-23 NOTE — Progress Notes (Incomplete)
 "   Primary Care Physician: Bulah Alm RAMAN, PA-C Primary Cardiologist: Diannah SHAUNNA Maywood, MD Electrophysiologist: Fonda Kitty, MD  Referring Physician: Alm Bulah PA   Robyn Hill is a 79 y.o. female with a history of HTN, carotid artery disease, CAD, DM, HLD, GI bleed, atrial fibrillation who presents for follow up in the [facility].  The patient was initially diagnosed with atrial fibrillation in 2023. Patient is on Eliquis  for stroke prevention. She was admitted 10/2023 with GI bleeding. She had chronic intermittent bleeding but became acutely worse. GI consulted and EGD showed bleeding angiectasia in the stomach, treated with APC. Colonoscopy showed 5 mm polyp in cecum (removed) and 20 mm polyp in transverse colon (retrieved), diverticulosis, internal hemorrhoids. Eliquis  resumed after 48 hours.    Patient returns for follow up for atrial fibrillation. ***  Today, she  denies symptoms of ***palpitations, chest pain, shortness of breath, orthopnea, PND, lower extremity edema, dizziness, presyncope, syncope, snoring, daytime somnolence, bleeding, or neurologic sequela. The patient is tolerating medications without difficulties and is otherwise without complaint today.    Atrial Fibrillation Risk Factors:  she does not have symptoms or diagnosis of sleep apnea. she does not have a history of rheumatic fever. The patient does have a history of early familial atrial fibrillation or other arrhythmias. Daughter has afib.   Atrial Fibrillation Management history:  Previous antiarrhythmic drugs: none Previous cardioversions: none Previous ablations: none Anticoagulation history: Eliquis   ROS- All systems are reviewed and negative except as per the HPI above.  Past Medical History:  Diagnosis Date   Adrenal mass, right    Allergy    Anemia    Asthma    as a teenager   Blind right eye    since childhood   Blood transfusion 1970   Chronic  back pain    Chronic diarrhea    Colestipol  therapy   Depression    Diverticulitis    Diverticulosis    Fatty liver    Female bladder prolapse    GERD (gastroesophageal reflux disease)    Glaucoma    H/O bone density study 11/29/2014   normal study although mild decreased in density from prior study   H/O hiatal hernia    Hemorrhoids    History of kidney stones    History of uterine cancer    s/p hysterectomy   Hyperlipidemia    Hypertension    Migraine    hx of migraines in past    Neuromuscular disorder (HCC)    diabetic neuropahthy   Numbness and tingling of both legs    Osteoarthritis    Rheumatic fever    age 14   Type II diabetes mellitus (HCC)    Urinary incontinence    Vitamin D  deficiency     Current Outpatient Medications  Medication Sig Dispense Refill   amoxicillin -clavulanate (AUGMENTIN ) 875-125 MG tablet Take 1 tablet by mouth 2 (two) times daily. 20 tablet 0   apixaban  (ELIQUIS ) 5 MG TABS tablet TAKE 1 TABLET BY MOUTH TWICE DAILY 180 tablet 0   atorvastatin  (LIPITOR) 80 MG tablet TAKE 1 TABLET BY MOUTH AT BEDTIME 90 tablet 1   brimonidine  (ALPHAGAN ) 0.2 % ophthalmic solution Place 1 drop into both eyes 2 (two) times daily.     COMFORT EZ PEN NEEDLES 31G X 5 MM MISC USE AS DIRECTED TO INJECT TOUJEO  DAILY 100 each 1   Continuous Glucose Sensor (FREESTYLE LIBRE 3 PLUS SENSOR) MISC place one sensor EVERY 15  DAYS. 2 each 5   dexlansoprazole  (DEXILANT ) 60 MG capsule TAKE ONE CAPSULE BY MOUTH TWICE DAILY 180 capsule 2   diltiazem  (CARDIZEM ) 30 MG tablet Take 30 mg by mouth 2 (two) times daily.     dorzolamide -timolol  (COSOPT ) 22.3-6.8 MG/ML ophthalmic solution Place 1 drop into both eyes 2 (two) times daily.     Dulaglutide  (TRULICITY ) 3 MG/0.5ML SOAJ Inject 3 mg into the skin once a week. 6 mL 2   DULoxetine  (CYMBALTA ) 60 MG capsule TAKE 1 CAPSULE BY MOUTH TWICE DAILY 60 capsule 0   enalapril  (VASOTEC ) 20 MG tablet TAKE 1 TABLET BY MOUTH DAILY 30 tablet 3    ferrous gluconate  (FERGON) 324 MG tablet Take 1 tablet (324 mg total) by mouth 2 (two) times daily with a meal. 60 tablet 3   fluocinolone  0.01 % cream APPLY TO THE AFFECTED AREA(S) OF THE LEGS AND ARMS AT BEDTIME AS NEEDED. 60 g 0   hydrochlorothiazide  (HYDRODIURIL ) 25 MG tablet TAKE 1 TABLET BY MOUTH DAILY 90 tablet 2   insulin  glargine, 1 Unit Dial , (TOUJEO  SOLOSTAR) 300 UNIT/ML Solostar Pen Inject 20 Units into the skin daily. 15 mL 2   latanoprost  (XALATAN ) 0.005 % ophthalmic solution Place 1 drop into both eyes at bedtime.     LINZESS  72 MCG capsule TAKE ONE CAPSULE BY MOUTH EVERY DAY BEFORE breakfast 90 capsule 2   Magnesium Citrate (MAGNESIUM GUMMIES PO) Take 500 mg by mouth daily.     metFORMIN  (GLUCOPHAGE ) 500 MG tablet TAKE 1 TABLET BY MOUTH TWICE DAILY WITH MEALS 180 tablet 0   morphine  (MSIR) 15 MG tablet Take 15 mg by mouth every 4 (four) hours.     Multiple Vitamins-Minerals (CENTRUM SILVER  50+WOMEN) TABS Take 2 capsules by mouth every morning.     Multiple Vitamins-Minerals (PRESERVISION AREDS 2) CAPS Take 2 capsules by mouth every morning.     nortriptyline  (PAMELOR ) 10 MG capsule Take 10 mg by mouth daily as needed for sleep.     PARoxetine  (PAXIL ) 20 MG tablet Take 1 tablet (20 mg total) by mouth daily. 30 tablet 1   triamcinolone  cream (KENALOG ) 0.1 % Apply 1 Application topically daily as needed (for itching- affected sites). 45 g 1   No current facility-administered medications for this visit.    Physical Exam: There were no vitals taken for this visit.  GEN: Well nourished, well developed in no acute distress NECK: No JVD; No carotid bruits*** CARDIAC: {EPRHYTHM:[id number]}, no murmurs, rubs, gallops RESPIRATORY:  Clear to auscultation without rales, wheezing or rhonchi  ABDOMEN: Soft, non-tender, non-distended EXTREMITIES:  No edema; No deformity    Wt Readings from Last 3 Encounters:  11/22/24 76.3 kg  09/27/24 81.2 kg  07/03/24 79.1 kg       ***   Echo  09/08/23 demonstrated   1. Left ventricular ejection fraction, by estimation, is 60 to 65%. The  left ventricle has normal function. The left ventricle has no regional  wall motion abnormalities. There is mild left ventricular hypertrophy.  Left ventricular diastolic parameters are indeterminate.   2. Right ventricular systolic function is normal. The right ventricular  size is normal. There is moderately elevated pulmonary artery systolic  pressure.   3. The mitral valve is abnormal. Mild mitral valve regurgitation. No  evidence of mitral stenosis.   4. The tricuspid valve is abnormal.   5. The aortic valve is tricuspid. Aortic valve regurgitation is not  visualized. No aortic stenosis is present.   6. The inferior vena  cava is normal in size with greater than 50%  respiratory variability, suggesting right atrial pressure of 3 mmHg.   Comparison(s): EF 65-70%. Mild LVH. RV size mildly increased. Moderate  TR-RVSP 46.6 mmHG. Mild MR.    CHA2DS2-VASc Score = 6  The patient's score is based upon: CHF History: 0 HTN History: 1 Diabetes History: 1 Stroke History: 0 Vascular Disease History: 1 Age Score: 2 Gender Score: 1   {Confirm score is correct.  If not, click here to update score.  REFRESH note.  :1}    ASSESSMENT AND PLAN: Paroxysmal Atrial Fibrillation (ICD10:  I48.0) The patient's CHA2DS2-VASc score is 6, indicating a 9.7% annual risk of stroke.  ***  Secondary Hypercoagulable State (ICD10:  D68.69){Click to add to Prob List or Visit Dx  :[id number]} The patient is at significant risk for stroke/thromboembolism based upon her CHA2DS2-VASc Score of 6.  Continue Apixaban  (Eliquis ). Patient scheduled for Watchman implant 12/19/24 with Dr Kennyth. ***     ASSESSMENT AND PLAN: Paroxysmal Atrial Fibrillation (ICD10:  I48.0) The patient's CHA2DS2-VASc score is 6, indicating a 9.7% annual risk of stroke.   Patient in SR today but having frequent episodes detected on her smart  watch. We discussed rhythm control options today including AAD and ablation. She is interested in discussing ablation, will refer to EP.  Continue diltiazem  60 mg TID Continue Eliquis  5 mg BID, see plans below.     HTN Stable on current regimen ***  CAD No anginal symptoms Followed by Dr Stacia ***   Follow up ***with Dr Kennyth as scheduled.       [facility] [address] [place], [place] [address] [phone] "

## 2024-11-24 ENCOUNTER — Ambulatory Visit: Payer: Self-pay | Admitting: Medical

## 2024-11-24 ENCOUNTER — Encounter: Payer: Self-pay | Admitting: Internal Medicine

## 2024-11-24 ENCOUNTER — Other Ambulatory Visit: Payer: Self-pay | Admitting: Medical

## 2024-11-24 DIAGNOSIS — E118 Type 2 diabetes mellitus with unspecified complications: Secondary | ICD-10-CM

## 2024-11-24 MED ORDER — LINACLOTIDE 72 MCG PO CAPS: 72.0000 ug | ORAL_CAPSULE | Freq: Every day | ORAL | 2 refills | Status: AC

## 2024-11-24 MED ORDER — APIXABAN 2.5 MG PO TABS: 2.5000 mg | ORAL_TABLET | Freq: Two times a day (BID) | ORAL | 1 refills | Status: AC

## 2024-11-24 MED ORDER — ENALAPRIL MALEATE 20 MG PO TABS: 20.0000 mg | ORAL_TABLET | Freq: Every day | ORAL | 3 refills | Status: AC

## 2024-11-24 MED ORDER — TRULICITY 3 MG/0.5ML ~~LOC~~ SOAJ: 3.0000 mg | SUBCUTANEOUS | 2 refills | Status: AC

## 2024-11-24 MED ORDER — HYDROCHLOROTHIAZIDE 25 MG PO TABS: 25.0000 mg | ORAL_TABLET | Freq: Every day | ORAL | 3 refills | Status: AC

## 2024-11-24 MED ORDER — FERROUS GLUCONATE 324 (38 FE) MG PO TABS: 324.0000 mg | ORAL_TABLET | Freq: Two times a day (BID) | ORAL | 1 refills | Status: AC

## 2024-11-24 MED ORDER — TOUJEO SOLOSTAR 300 UNIT/ML ~~LOC~~ SOPN: 25.0000 [IU] | PEN_INJECTOR | Freq: Every day | SUBCUTANEOUS | 5 refills | Status: AC

## 2024-11-24 NOTE — Progress Notes (Signed)
 Results through MyChart

## 2024-12-01 ENCOUNTER — Ambulatory Visit (HOSPITAL_COMMUNITY): Admitting: Physician Assistant

## 2024-12-06 ENCOUNTER — Ambulatory Visit: Admitting: Cardiology

## 2024-12-19 ENCOUNTER — Encounter (HOSPITAL_COMMUNITY): Payer: Self-pay

## 2024-12-19 ENCOUNTER — Inpatient Hospital Stay (HOSPITAL_COMMUNITY): Admit: 2024-12-19 | Admitting: Cardiology

## 2024-12-19 SURGERY — ATRIAL FIBRILLATION ABLATION
Anesthesia: General
